# Patient Record
Sex: Male | Born: 1990
Health system: Southern US, Community
[De-identification: ages and names within clinical notes are randomized; demographics above are authoritative.]

## PROBLEM LIST (undated history)

## (undated) DIAGNOSIS — F419 Anxiety disorder, unspecified: Secondary | ICD-10-CM

## (undated) DIAGNOSIS — K3184 Gastroparesis: Secondary | ICD-10-CM

## (undated) DIAGNOSIS — K219 Gastro-esophageal reflux disease without esophagitis: Secondary | ICD-10-CM

## (undated) DIAGNOSIS — F32A Depression, unspecified: Secondary | ICD-10-CM

## (undated) DIAGNOSIS — E119 Type 2 diabetes mellitus without complications: Secondary | ICD-10-CM

## (undated) HISTORY — DX: Gastro-esophageal reflux disease without esophagitis: K21.9

## (undated) HISTORY — DX: Anxiety disorder, unspecified: F41.9

## (undated) HISTORY — DX: Depression, unspecified: F32.A

## (undated) HISTORY — PX: NO PAST SURGERIES: SHX2092

## (undated) HISTORY — PX: UPPER GASTROINTESTINAL ENDOSCOPY: SHX188

---
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
## 2008-05-20 ENCOUNTER — Emergency Department (HOSPITAL_COMMUNITY): Admission: EM | Admit: 2008-05-20 | Discharge: 2008-05-21 | Payer: Self-pay | Admitting: Emergency Medicine

## 2010-05-15 ENCOUNTER — Emergency Department (HOSPITAL_COMMUNITY): Admission: EM | Admit: 2010-05-15 | Discharge: 2010-05-16 | Payer: Self-pay | Admitting: Emergency Medicine

## 2010-12-06 LAB — CBC
MCH: 27 pg (ref 26.0–34.0)
MCV: 79.7 fL (ref 78.0–100.0)
Platelets: 356 10*3/uL (ref 150–400)
RDW: 13.6 % (ref 11.5–15.5)

## 2010-12-06 LAB — URINALYSIS, ROUTINE W REFLEX MICROSCOPIC
Bilirubin Urine: NEGATIVE
Glucose, UA: >1000 mg/dL — AB
Ketones, ur: NEGATIVE mg/dL
Nitrite: NEGATIVE
pH: 5.5 (ref 5.0–8.0)

## 2010-12-06 LAB — DIFFERENTIAL
Basophils Absolute: 0.1 10*3/uL (ref 0.0–0.1)
Eosinophils Absolute: 0.4 10*3/uL (ref 0.0–0.7)
Eosinophils Relative: 4 % (ref 0–5)

## 2010-12-06 LAB — URINE CULTURE
Colony Count: >=100000
Culture  Setup Time: 201108250454

## 2010-12-06 LAB — POCT I-STAT, CHEM 8
Hemoglobin: 16.3 g/dL (ref 13.0–17.0)
Sodium: 135 mEq/L (ref 135–145)
TCO2: 24 mmol/L (ref 0–100)

## 2010-12-06 LAB — GLUCOSE, CAPILLARY
Glucose-Capillary: 330 mg/dL — ABNORMAL HIGH (ref 70–99)
Glucose-Capillary: 343 mg/dL — ABNORMAL HIGH (ref 70–99)
Glucose-Capillary: 392 mg/dL — ABNORMAL HIGH (ref 70–99)

## 2010-12-06 LAB — URINE MICROSCOPIC-ADD ON

## 2010-12-29 ENCOUNTER — Emergency Department (HOSPITAL_COMMUNITY): Payer: Self-pay

## 2010-12-29 ENCOUNTER — Emergency Department (HOSPITAL_COMMUNITY)
Admission: EM | Admit: 2010-12-29 | Discharge: 2010-12-29 | Disposition: A | Discharge status: Home / Self Care | Payer: Self-pay | Attending: Emergency Medicine | Admitting: Emergency Medicine

## 2010-12-29 DIAGNOSIS — E119 Type 2 diabetes mellitus without complications: Secondary | ICD-10-CM | POA: Insufficient documentation

## 2010-12-29 DIAGNOSIS — Y9361 Activity, american tackle football: Secondary | ICD-10-CM | POA: Insufficient documentation

## 2010-12-29 DIAGNOSIS — M25519 Pain in unspecified shoulder: Secondary | ICD-10-CM | POA: Insufficient documentation

## 2010-12-29 DIAGNOSIS — W1801XA Striking against sports equipment with subsequent fall, initial encounter: Secondary | ICD-10-CM | POA: Insufficient documentation

## 2011-02-04 NOTE — Discharge Summary (Signed)
NAMEMARLOW, Jonathan Porter               ACCOUNT NO.:  [id number]   MEDICAL RECORD NO.:  [id number]          PATIENT TYPE:  INP   LOCATION:  1430                         FACILITY:  [facility]   PHYSICIAN:  Ladell Pier, M.D.   DATE OF BIRTH:  December 05, 1990   DATE OF ADMISSION:  08/30/2007  DATE OF DISCHARGE:  09/02/2007                               DISCHARGE SUMMARY   DISCHARGE DIAGNOSES:  1. New-onset diabetes most likely type 2, lab work still pending on      discharge.  2. Dyslipidemia.  3. Obesity.  4. Hypokalemia.  5. Diabetic ketoacidosis.  6. Elevated blood pressure.   DISCHARGE MEDICATIONS:  1. Lantus 40 units in the morning and 36 units at bedtime.  2. NovoLog resistant sliding scale as given to the patient.  Lancet,      strips and Glucometer.   FOLLOW-UP APPOINTMENTS:  The patient to follow up with Dr. Nehemiah Settle or  with [facility] next week.   PROCEDURE:  None.   CONSULTANTS:  None.   HISTORY OF PRESENT ILLNESS:  The patient is an 20 year old African-  American male that presented to the hospital secondary to dizziness upon  ambulation, polyuria, polydipsia, nausea with active fatigue.  He  apparently went to [facility].  CBG was high and  ketones in the urine.  The patient then was sent to the emergency room  to be evaluated for DKA.  He had no chest pain.  No shortness of breath.  No mental status changes.   Past medical history, family, social history, meds, allergies, review of  systems per admission H&P.   PHYSICAL EXAMINATION:  VITAL SIGNS:  Temperature 98.1, pulse of 91,  respirations 20, blood pressure 152/76, pulse ox 100% on room air.  CBG  158-444.  HEENT:  Head is normocephalic, atraumatic.  Pupils are equal, round and  reactive to light.  Throat without erythema.  CARDIOVASCULAR:  Regular rate and rhythm.  LUNGS:  Clear bilaterally.  ABDOMEN:  Positive bowel sounds.  EXTREMITIES:  No edema.   HOSPITAL COURSE:  1.  Diabetic ketoacidosis:  The patient was admitted to the hospital,      placed on IV fluids and a Glucommander drip.  Blood sugars came      down.  When the Glucommander drip was and discontinued and the      patient was transitioned to Lantus and insulin, blood sugar      increased, and he was placed back on the Glucommander.  The patient      will be held until  lunchtime today and given an increased dose of      Lantus.  The patient told to follow up with primary care physician      next week for management of his diabetes.  He did have labs drawn      to determine if he is type 1 versus type 2.  The labs were pending      on discharge.  He is most likely type 2 secondary to him having      acanthosis nigricans, his obesity and  high cholesterol with      elevated blood pressure.  On follow up of the labs,  he could get      his other lab tests to help with the evaluation of type 1 versus      type 2 diabetes.  Labs that were ordered were glutamic acid      decarboxylase, tyrosine phosphatase serum, isolette specific      pancreatic autoantibodies.  However, we will discontinue the      patient on Lantus and sliding scale secondary to his elevated blood      sugars.  However if he does turn out to be type 1, he could be      transitioned to pills on an outpatient basis.  2. Elevated blood pressure:  His blood pressure could be elevated just      from him receiving high-dose fluids.  We will not start him on      blood pressure medicine.  He will immediately start diet and      exercise on discharge.  We will recheck his blood pressure on an      outpatient basis.  3. Dyslipidemia:  He was also noted to have high cholesterol.      Secondary to his age, I  will also not start him on cholesterol      medicine.  I will go ahead and encourage him to do diet and      exercise.  Hopefully with that, the cholesterol problems will      resolve.  4. Obesity:  Talked with him about diet and  exercise.  That will      definitely help with all the elevated blood pressure, the new onset      type 2 diabetes and the high cholesterol.  I really stressed the      importance of diet and exercise.  Also I gave him a card for clinic      to assist with diet and exercise planning.   LABORATORY DATA:  Discharge labs:  Sodium 137, potassium 3.4, the  patient received potassium replacement prior to discharge, chloride 109,  CO2 20, glucose 349, BUN 10, creatinine 0.94, WBC 5.7, hemoglobin 13.1,  platelets 265, phosphorus 2.8, magnesium 2.1, hemoglobin A1c 13.9, total  cholesterol 339, triglycerides 260, HDL 39, LDL 248.   DIAGNOSTICS:  Chest x-ray showed no acute disease.      Ladell Pier, M.D.  Electronically Signed     NJ/MEDQ  D:  09/02/2007  T:  09/02/2007  Job:  [id number]   cc:   [facility] [place]  Fax: [phone]   Jonathan Porter, M.D.

## 2011-02-04 NOTE — H&P (Signed)
Jonathan Porter, ENGRAM NO.:  [id number]   MEDICAL RECORD NO.:  [id number]         PATIENT TYPE:  LINP   LOCATION:                               FACILITY:  [facility]   PHYSICIAN:  Lucita Ferrara, MD         DATE OF BIRTH:  11/19/1990   DATE OF ADMISSION:  08/30/2007  DATE OF DISCHARGE:                              HISTORY & PHYSICAL   The patient is an 20 year old male who presents to [facility] from [facility].  Primary care physician is  [facility].  Apparently the patient had been seeing Dr.  Renford Dills from [facility] in the past and was unable to get an  appointment and switched primary care doctors today.   CHIEF COMPLAINT:  Decreased p.o. intake, vomiting, nausea x1 week.   REVIEW OF SYSTEMS:  The patient has also been dizzy upon ambulation,  polyuria, polydipsia; but, because the patient has been very nauseated,  had decreased intake in the interim period.  These symptoms have been  associated with a lot of fatigue.  He does go to the bathroom a lot but  denies any urinary burning or blood present.  Apparently at the  [facility], the CBGs were high, and there were  ketones in the urine.  The patient then was sent her to be evaluated for  diabetic ketoacidosis.  Never had any symptoms such as this before.  The  patient denies any chest pain, shortness of breath, focal neurological  deficits.  Denies any recent infections, fevers, chills, neck pain.  The  patient has been fatigued and groggy,  but denies any changes in mental  status or incomprehensible speech and no changes in visual acuity.  Otherwise,  12-point Review of Systems is negative.   MEDICATIONS:  None.   ALLERGIES:  No known drug allergies.   PAST MEDICAL HISTORY:  None.   PAST SURGICAL HISTORY:  None.   SOCIAL HISTORY:  The patient lives with parents.  He is in the 12th  grade and denies drugs, alcohol, or  tobacco.   PHYSICAL EXAMINATION:  VITAL SIGNS:  Blood pressure 161/57, pulse 122,  respirations 20, pulse oximetry 96% on room air.  HEENT:  Normocephalic and atraumatic.  Mucous membranes are moist.  Extraocular muscles intact.  PERRLA.  GENERAL:  The patient looks  kind of fatigued but alert and oriented and  talkative.  NECK:  No JVD, no carotid bruits.  Neck supple.  CARDIOVASCULAR:  S1, S2.  Regular rate and rhythm.  No murmurs, rubs, or  clicks.  ABDOMEN:  Soft, nontender, nondistended.  Positive bowel sounds.  LUNGS:  Clear to auscultation bilaterally.  No rhonchi, rales, or  wheezes.  EXTREMITIES:  No clubbing, cyanosis, or edema.   LABORATORY DATA:  Shows a pH of 7.325, pCO2 of 29.6, pO2 of 96.8, bicarb  15. White count 12, hemoglobin 16.2, hematocrit 48.9, platelets 401.  Neutrophil count of 84% which is elevated.  Sodium 127, potassium 5.7,  chloride 86, CO2 15, BUN 24, creatinine 1.97, calcium 10.2.  Urine shows  specific gravity of 1.034, pH 5, and ketones, leukocyte esterase  negative, urine nitrites negative.  Serum acetone ws not done.   There  was no chest x-ray done.   EKG shows sinus tachycardia with nonspecific ST-T wave changes.   ASSESSMENT AND PLAN:  1. This 20 year old young African-American gentleman is obviously in      diabetic ketoacidosis first time, new onset diabetes.  The patient      is quite dehydrated.  We are going to go ahead and hydrate him with      normal saline.  Currently it is running at 125 mL an hour.  I am      going to give him 2 liters as I think he is behind on his fluids      given his hemoconcentration of 16.2/48/9.  In addition, his urine      specific gravity is 1.034, additional support for his dehydration.      He has pseudo-hyponatremia secondary to hyperglycemia and gap      acidosis consistent with diabetic ketoacidosis.  We will go ahead      and start him on an insulin drip per protocol and monitor his      potassium,  magnesium, and start IV D5 one-half normal saline once      blood sugars drop below 250.  In addition, we are going to go ahead      and repeat potassium given intracellular depletion with insulin.  2. Diabetic control.  I am going to go ahead and get a hemoglobin A1c      and perhaps diabetic teaching to show diabetes video,  and patient      then will be started on oral.  Also once his insulin goes down, I      will get a fasting insulin level to see if patient is type 1 or      type 2.  The patient will most likely go home either with oral or      insulin depending on his needs and his beta cell function.  3. In regards to hypertension, this may be reactive. We are going to      go ahead an monitor it.  I will put him on a low dose of non-ACE,      non-thiazide medicine, perhaps metoprolol or beta  blocker p.r.n.  4. Just for completeness, I am going to go ahead and get a chest x-ray      given his leukocytosis.  His urine is clean.  5. Creatinine is 1.97, BUN 24.  Obviously prerenal azotemia secondary      to hydration status, but will monitor his kidney function, renal      function, and if still elevated, will go ahead and work that up      with either doing urine protein, creatinine, urine sodium,      bilateral renal ultrasound possibly, and later on may decide to get      a nephrology consult if this does not correct.   Given that he has two primary care doctors, Dr. Renford Dills with [facility], and [facility] which he started with today,  will send copy to both individuals, letting them know that the patient  is here in the hospital.  I do believe they are still deciding who to  follow up with.   Currently the patient is hemodynamically stable, and I think a telemetry  bed will be adequate  for him.  If  things deteriorate and his metabolic  status become decompensated, we will go ahead and admit him to the  intensive care unit.  I have explained all  plans and procedures of this  admission to the family.  Mother and father are at bedside, and they  both understand.      Lucita Ferrara, MD  Electronically Signed     RR/MEDQ  D:  08/30/2007  T:  08/30/2007  Job:  [id number]   cc:   Deirdre Peer. Polite, M.D.

## 2011-06-30 LAB — BASIC METABOLIC PANEL
BUN: 14
BUN: 16
BUN: 16
BUN: 18
BUN: 19
CO2: 20
CO2: 23
CO2: 25
CO2: 26
CO2: 27
CO2: 28
Calcium: 8.9
Calcium: 9.1
Calcium: 9.4
Calcium: 9.6
Calcium: 9.9
Calcium: 9.9
Chloride: 111
Chloride: 111
Chloride: 114 — ABNORMAL HIGH
Creatinine, Ser: 0.96
Creatinine, Ser: 1.03
Creatinine, Ser: 1.07
Creatinine, Ser: 1.09
Creatinine, Ser: 1.2
Creatinine, Ser: 1.24
Creatinine, Ser: 1.29
GFR calc Af Amer: >60
GFR calc Af Amer: >60
GFR calc Af Amer: >60
GFR calc Af Amer: >60
GFR calc Af Amer: >60
GFR calc Af Amer: >60
GFR calc non Af Amer: >60
GFR calc non Af Amer: >60
GFR calc non Af Amer: >60
GFR calc non Af Amer: >60
Glucose, Bld: 170 — ABNORMAL HIGH
Glucose, Bld: 203 — ABNORMAL HIGH
Glucose, Bld: 206 — ABNORMAL HIGH
Glucose, Bld: 221 — ABNORMAL HIGH
Glucose, Bld: 409 — ABNORMAL HIGH
Potassium: 3.5
Potassium: 3.7
Sodium: 137
Sodium: 138
Sodium: 147 — ABNORMAL HIGH
Sodium: 148 — ABNORMAL HIGH

## 2011-06-30 LAB — COMPREHENSIVE METABOLIC PANEL
ALT: 39
Calcium: 10.5
GFR calc Af Amer: 54 — ABNORMAL LOW
Glucose, Bld: 1168
Sodium: 127 — ABNORMAL LOW
Total Protein: 8.8 — ABNORMAL HIGH

## 2011-06-30 LAB — CBC
Hemoglobin: 13.1
Hemoglobin: 16.2
MCHC: 33.1
MCHC: 34
RBC: 5.07
RDW: 13.2
WBC: 5.7

## 2011-06-30 LAB — URINALYSIS, ROUTINE W REFLEX MICROSCOPIC
Glucose, UA: >1000 — AB
Specific Gravity, Urine: 1.034 — ABNORMAL HIGH
pH: 5

## 2011-06-30 LAB — BLOOD GAS, ARTERIAL
Drawn by: 101881
FIO2: 0.21
pCO2 arterial: 29.6 — ABNORMAL LOW
pH, Arterial: 7.325 — ABNORMAL LOW
pO2, Arterial: 96.8

## 2011-06-30 LAB — PHOSPHORUS
Phosphorus: 1.4 — ABNORMAL LOW
Phosphorus: 2.1 — ABNORMAL LOW
Phosphorus: 2.1 — ABNORMAL LOW
Phosphorus: 2.8

## 2011-06-30 LAB — HEMOGLOBIN A1C: Hgb A1c MFr Bld: 13.9 — ABNORMAL HIGH

## 2011-06-30 LAB — LIPID PANEL
HDL: 39
LDL Cholesterol: 248 — ABNORMAL HIGH
Total CHOL/HDL Ratio: 8.7
Triglycerides: 260 — ABNORMAL HIGH
VLDL: 52 — ABNORMAL HIGH

## 2011-06-30 LAB — DIFFERENTIAL
Eosinophils Absolute: 0 — ABNORMAL LOW
Lymphocytes Relative: 11 — ABNORMAL LOW
Lymphs Abs: 1.3
Monocytes Relative: 5
Neutrophils Relative %: 84 — ABNORMAL HIGH

## 2011-06-30 LAB — GLUCOSE, RANDOM: Glucose, Bld: 490 — ABNORMAL HIGH

## 2011-06-30 LAB — MAGNESIUM: Magnesium: 2.1

## 2011-06-30 LAB — URINE MICROSCOPIC-ADD ON: RBC / HPF: NONE SEEN

## 2011-08-01 ENCOUNTER — Emergency Department (HOSPITAL_COMMUNITY)
Admission: EM | Admit: 2011-08-01 | Discharge: 2011-08-02 | Discharge status: Against Medical Advice | Payer: Self-pay | Attending: Emergency Medicine | Admitting: Emergency Medicine

## 2011-08-01 ENCOUNTER — Encounter: Payer: Self-pay | Admitting: *Deleted

## 2011-08-01 DIAGNOSIS — R040 Epistaxis: Secondary | ICD-10-CM | POA: Insufficient documentation

## 2011-08-01 DIAGNOSIS — S0993XA Unspecified injury of face, initial encounter: Secondary | ICD-10-CM

## 2011-08-01 DIAGNOSIS — S0120XA Unspecified open wound of nose, initial encounter: Secondary | ICD-10-CM | POA: Insufficient documentation
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
## 2011-08-02 ENCOUNTER — Encounter (HOSPITAL_COMMUNITY): Payer: Self-pay

## 2011-08-02 ENCOUNTER — Emergency Department (INDEPENDENT_AMBULATORY_CARE_PROVIDER_SITE_OTHER): Payer: Self-pay

## 2011-08-02 ENCOUNTER — Emergency Department (INDEPENDENT_AMBULATORY_CARE_PROVIDER_SITE_OTHER)
Admission: EM | Admit: 2011-08-02 | Discharge: 2011-08-02 | Disposition: A | Discharge status: Home / Self Care | Payer: Self-pay | Source: Home / Self Care | Admitting: Emergency Medicine

## 2011-08-02 DIAGNOSIS — S0120XA Unspecified open wound of nose, initial encounter: Secondary | ICD-10-CM

## 2011-08-02 DIAGNOSIS — S0121XA Laceration without foreign body of nose, initial encounter: Secondary | ICD-10-CM

## 2011-08-02 MED ORDER — HYDROCODONE-ACETAMINOPHEN 5-325 MG PO TABS: 1.0000 | ORAL_TABLET | ORAL | Status: AC | PRN

## 2011-08-02 MED ORDER — DOXYCYCLINE HYCLATE 100 MG PO CAPS: 100.0000 mg | ORAL_CAPSULE | Freq: Two times a day (BID) | ORAL | Status: AC

## 2011-08-02 MED ORDER — IBUPROFEN 600 MG PO TABS: 600.0000 mg | ORAL_TABLET | Freq: Four times a day (QID) | ORAL | Status: AC | PRN

## 2011-08-02 MED ORDER — HYDROCODONE-ACETAMINOPHEN 5-325 MG PO TABS
ORAL_TABLET | ORAL | Status: AC
  Filled 2011-08-02: qty 1

## 2011-08-02 MED ORDER — HYDROCODONE-ACETAMINOPHEN 5-325 MG PO TABS
1.0000 | ORAL_TABLET | Freq: Once | ORAL | Status: AC
  Administered 2011-08-02: 1 via ORAL
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
## 2011-08-06 NOTE — ED Provider Notes (Signed)
Medical screening examination/treatment/procedure(s) were performed by non-physician practitioner and as supervising physician I was immediately available for consultation/collaboration.  Luiz Blare MD   Danella Maiers Carleigh Buccieri 08/06/11 1401

## 2014-08-07 ENCOUNTER — Inpatient Hospital Stay (HOSPITAL_COMMUNITY)
Admission: EM | Admit: 2014-08-07 | Discharge: 2014-08-09 | DRG: 638 | Disposition: A | Discharge status: Home / Self Care | Payer: Self-pay | Attending: Internal Medicine | Admitting: Internal Medicine

## 2014-08-07 ENCOUNTER — Encounter (HOSPITAL_COMMUNITY): Payer: Self-pay | Admitting: Emergency Medicine

## 2014-08-07 DIAGNOSIS — F17210 Nicotine dependence, cigarettes, uncomplicated: Secondary | ICD-10-CM | POA: Diagnosis present

## 2014-08-07 DIAGNOSIS — E1010 Type 1 diabetes mellitus with ketoacidosis without coma: Secondary | ICD-10-CM | POA: Diagnosis present

## 2014-08-07 DIAGNOSIS — E1110 Type 2 diabetes mellitus with ketoacidosis without coma: Secondary | ICD-10-CM | POA: Diagnosis present

## 2014-08-07 DIAGNOSIS — E871 Hypo-osmolality and hyponatremia: Secondary | ICD-10-CM | POA: Diagnosis present

## 2014-08-07 DIAGNOSIS — E1310 Other specified diabetes mellitus with ketoacidosis without coma: Principal | ICD-10-CM | POA: Diagnosis present

## 2014-08-07 DIAGNOSIS — N471 Phimosis: Secondary | ICD-10-CM | POA: Diagnosis present

## 2014-08-07 DIAGNOSIS — F1290 Cannabis use, unspecified, uncomplicated: Secondary | ICD-10-CM | POA: Diagnosis present

## 2014-08-07 HISTORY — DX: Type 2 diabetes mellitus without complications: E11.9

## 2014-08-07 LAB — GLUCOSE, CAPILLARY
GLUCOSE-CAPILLARY: 268 mg/dL — AB (ref 70–99)
GLUCOSE-CAPILLARY: 208 mg/dL — AB (ref 70–99)
GLUCOSE-CAPILLARY: 188 mg/dL — AB (ref 70–99)
Glucose-Capillary: 402 mg/dL — ABNORMAL HIGH (ref 70–99)

## 2014-08-07 LAB — BASIC METABOLIC PANEL
Anion gap: 15 (ref 5–15)
Anion gap: 15 (ref 5–15)
BUN: 16 mg/dL (ref 6–23)
BUN: 15 mg/dL (ref 6–23)
CHLORIDE: 98 meq/L (ref 96–112)
CHLORIDE: 101 meq/L (ref 96–112)
CO2: 26 mEq/L (ref 19–32)
CO2: 22 mEq/L (ref 19–32)
Calcium: 10.3 mg/dL (ref 8.4–10.5)
Calcium: 10 mg/dL (ref 8.4–10.5)
Creatinine, Ser: 1.11 mg/dL (ref 0.50–1.35)
Creatinine, Ser: 0.86 mg/dL (ref 0.50–1.35)
GFR calc Af Amer: >90 mL/min (ref >90)
GFR calc non Af Amer: >90 mL/min (ref >90)
GLUCOSE: 322 mg/dL — AB (ref 70–99)
GLUCOSE: 190 mg/dL — AB (ref 70–99)
POTASSIUM: 3.9 meq/L (ref 3.7–5.3)
POTASSIUM: 5.6 meq/L — AB (ref 3.7–5.3)
SODIUM: 139 meq/L (ref 137–147)
SODIUM: 138 meq/L (ref 137–147)

## 2014-08-07 LAB — CBC
HCT: 43.6 % (ref 39.0–52.0)
HEMATOCRIT: 45.3 % (ref 39.0–52.0)
HEMOGLOBIN: 15.8 g/dL (ref 13.0–17.0)
HEMOGLOBIN: 15.4 g/dL (ref 13.0–17.0)
MCH: 26.6 pg (ref 26.0–34.0)
MCH: 26.4 pg (ref 26.0–34.0)
MCHC: 34.9 g/dL (ref 30.0–36.0)
MCHC: 35.3 g/dL (ref 30.0–36.0)
MCV: 76.3 fL — AB (ref 78.0–100.0)
MCV: 74.7 fL — AB (ref 78.0–100.0)
Platelets: 281 10*3/uL (ref 150–400)
Platelets: 301 10*3/uL (ref 150–400)
RBC: 5.94 MIL/uL — ABNORMAL HIGH (ref 4.22–5.81)
RBC: 5.84 MIL/uL — AB (ref 4.22–5.81)
RDW: 12.6 % (ref 11.5–15.5)
RDW: 12.6 % (ref 11.5–15.5)
WBC: 6.6 10*3/uL (ref 4.0–10.5)
WBC: 8.3 10*3/uL (ref 4.0–10.5)

## 2014-08-07 LAB — COMPREHENSIVE METABOLIC PANEL
ALK PHOS: 137 U/L — AB (ref 39–117)
ALT: 21 U/L (ref 0–53)
ANION GAP: 23 — AB (ref 5–15)
AST: 19 U/L (ref 0–37)
Albumin: 4.5 g/dL (ref 3.5–5.2)
BUN: 19 mg/dL (ref 6–23)
CALCIUM: 10.1 mg/dL (ref 8.4–10.5)
CO2: 20 mEq/L (ref 19–32)
CREATININE: 1.13 mg/dL (ref 0.50–1.35)
Chloride: 83 mEq/L — ABNORMAL LOW (ref 96–112)
GFR calc non Af Amer: 89 mL/min — ABNORMAL LOW (ref >90)
GLUCOSE: 919 mg/dL — AB (ref 70–99)
Potassium: 4.8 mEq/L (ref 3.7–5.3)
Sodium: 126 mEq/L — ABNORMAL LOW (ref 137–147)
TOTAL PROTEIN: 9 g/dL — AB (ref 6.0–8.3)
Total Bilirubin: 0.3 mg/dL (ref 0.3–1.2)

## 2014-08-07 LAB — URINALYSIS, ROUTINE W REFLEX MICROSCOPIC
BILIRUBIN URINE: NEGATIVE
Glucose, UA: >1000 mg/dL — AB
Hgb urine dipstick: NEGATIVE
Ketones, ur: 15 mg/dL — AB
LEUKOCYTES UA: NEGATIVE
NITRITE: NEGATIVE
Protein, ur: NEGATIVE mg/dL
SPECIFIC GRAVITY, URINE: 1.03 (ref 1.005–1.030)
UROBILINOGEN UA: 0.2 mg/dL (ref 0.0–1.0)
pH: 5 (ref 5.0–8.0)

## 2014-08-07 LAB — CBG MONITORING, ED
Glucose-Capillary: >600 mg/dL (ref 70–99)
Glucose-Capillary: 506 mg/dL — ABNORMAL HIGH (ref 70–99)

## 2014-08-07 LAB — URINE MICROSCOPIC-ADD ON

## 2014-08-07 MED ORDER — INSULIN REGULAR HUMAN 100 UNIT/ML IJ SOLN
INTRAMUSCULAR | Status: DC
  Administered 2014-08-07: 3.4 [IU]/h via INTRAVENOUS
  Administered 2014-08-08: 1.4 [IU]/h via INTRAVENOUS
  Administered 2014-08-08: 1.7 [IU]/h via INTRAVENOUS
  Filled 2014-08-07: qty 2.5

## 2014-08-07 MED ORDER — DEXTROSE 50 % IV SOLN: 25.0000 mL | INTRAVENOUS | Status: DC | PRN

## 2014-08-07 MED ORDER — HEPARIN SODIUM (PORCINE) 5000 UNIT/ML IJ SOLN
5000.0000 [IU] | Freq: Three times a day (TID) | INTRAMUSCULAR | Status: DC
  Administered 2014-08-07 – 2014-08-09 (×5): 5000 [IU] via SUBCUTANEOUS
  Filled 2014-08-07 (×9): qty 1

## 2014-08-07 MED ORDER — INSULIN REGULAR HUMAN 100 UNIT/ML IJ SOLN
INTRAMUSCULAR | Status: DC
  Administered 2014-08-07: 5.4 [IU]/h via INTRAVENOUS
  Filled 2014-08-07: qty 2.5

## 2014-08-07 MED ORDER — DEXTROSE-NACL 5-0.45 % IV SOLN: INTRAVENOUS | Status: DC

## 2014-08-07 MED ORDER — DEXTROSE-NACL 5-0.45 % IV SOLN
INTRAVENOUS | Status: DC
  Administered 2014-08-07: 75 mL/h via INTRAVENOUS
  Administered 2014-08-08: 13:00:00 via INTRAVENOUS

## 2014-08-07 MED ORDER — POTASSIUM CHLORIDE 10 MEQ/100ML IV SOLN
10.0000 meq | INTRAVENOUS | Status: AC
  Administered 2014-08-07 (×2): 10 meq via INTRAVENOUS
  Filled 2014-08-07 (×2): qty 100

## 2014-08-07 MED ORDER — SODIUM CHLORIDE 0.9 % IV BOLUS (SEPSIS)
1000.0000 mL | Freq: Once | INTRAVENOUS | Status: AC
  Administered 2014-08-07: 1000 mL via INTRAVENOUS

## 2014-08-07 MED ORDER — SODIUM CHLORIDE 0.9 % IV SOLN: INTRAVENOUS | Status: AC

## 2014-08-07 MED ORDER — SODIUM CHLORIDE 0.9 % IV SOLN
INTRAVENOUS | Status: DC
  Administered 2014-08-07: 100 mL/h via INTRAVENOUS

## 2014-08-07 NOTE — ED Notes (Signed)
Patient reports tht he has had increased urination, thirst x 1 week. Patient denies any vision disturbances, N/V or abdominal pain.

## 2014-08-07 NOTE — ED Notes (Addendum)
Pt from home c/o hyperglycemia/ CBG registered greater than 600. Pt also c/o penile pain and reports he is not circumcised. It is swollen and he is unable to retract foreskin. Denies discharge or dysuria but reports itching.. NO PCP and not on insulin or diabetes medication. PT does not have insurance.

## 2014-08-07 NOTE — ED Provider Notes (Signed)
CSN: [id number]     Arrival date & time 08/07/14  1413 History   First MD Initiated Contact with Patient 08/07/14 1453     Chief Complaint  Patient presents with  . Hyperglycemia  . Penis Pain     (Consider location/radiation/quality/duration/timing/severity/associated sxs/prior Treatment) Patient is a 23 y.o. male presenting with general illness.  Illness Location:  Generalized, penis Quality:  Malaise, increased urinary frequency, phimosis Severity:  Moderate Onset quality:  Gradual Duration:  2 weeks Timing:  Constant Progression:  Worsening Chronicity:  Recurrent Context:  DM, drinking sugary drinks Relieved by:  Nothing Worsened by:  Nothing Associated symptoms: no abdominal pain, no chest pain, no cough, no fever, no loss of consciousness, no nausea, no shortness of breath and no vomiting     Past Medical History  Diagnosis Date  . Diabetes mellitus without complication    Past Surgical History  Procedure Laterality Date  . No past surgeries     History reviewed. No pertinent family history. History  Substance Use Topics  . Smoking status: Current Every Day Smoker -- 0.25 packs/day for 6 years    Types: Cigarettes  . Smokeless tobacco: Never Used  . Alcohol Use: No    Review of Systems  Constitutional: Negative for fever.  Respiratory: Negative for cough and shortness of breath.   Cardiovascular: Negative for chest pain.  Gastrointestinal: Negative for nausea, vomiting and abdominal pain.  Neurological: Negative for loss of consciousness.  All other systems reviewed and are negative.     Allergies  Review of patient's allergies indicates no known allergies.  Home Medications   Prior to Admission medications   Not on File   BP 132/78 mmHg  Pulse 73  Temp(Src) 97.8 F (36.6 C) (Oral)  Resp 13  Wt 242 lb 4.6 oz (109.9 kg)  SpO2 97% Physical Exam  Constitutional: He is oriented to person, place, and time. He appears well-developed and  well-nourished.  HENT:  Head: Normocephalic and atraumatic.  Eyes: Conjunctivae and EOM are normal.  Neck: Normal range of motion. Neck supple.  Cardiovascular: Normal rate, regular rhythm and normal heart sounds.   Pulmonary/Chest: Effort normal and breath sounds normal. No respiratory distress.  Abdominal: He exhibits no distension. There is no tenderness. There is no rebound and no guarding.  Genitourinary:  Phimosis without signs of paraphimosis or swelling, unable to visualize glans initially due to persistent phimosis  Musculoskeletal: Normal range of motion.  Neurological: He is alert and oriented to person, place, and time.  Skin: Skin is warm and dry.  Vitals reviewed.   ED Course  Procedures (including critical care time) Labs Review Labs Reviewed  CBC - Abnormal; Notable for the following:    RBC 5.94 (*)    MCV 76.3 (*)    All other components within normal limits  COMPREHENSIVE METABOLIC PANEL - Abnormal; Notable for the following:    Sodium 126 (*)    Chloride 83 (*)    Glucose, Bld 919 (*)    Total Protein 9.0 (*)    Alkaline Phosphatase 137 (*)    GFR calc non Af Amer 89 (*)    Anion gap 23 (*)    All other components within normal limits  URINALYSIS, ROUTINE W REFLEX MICROSCOPIC - Abnormal; Notable for the following:    Glucose, UA >1000 (*)    Ketones, ur 15 (*)    All other components within normal limits  CBC - Abnormal; Notable for the following:    RBC  5.84 (*)    MCV 74.7 (*)    All other components within normal limits  HEMOGLOBIN A1C - Abnormal; Notable for the following:    Hgb A1c MFr Bld 13.3 (*)    Mean Plasma Glucose 335 (*)    All other components within normal limits  GLUCOSE, CAPILLARY - Abnormal; Notable for the following:    Glucose-Capillary 402 (*)    All other components within normal limits  BASIC METABOLIC PANEL - Abnormal; Notable for the following:    Glucose, Bld 322 (*)    All other components within normal limits  BASIC  METABOLIC PANEL - Abnormal; Notable for the following:    Potassium 5.6 (*)    Glucose, Bld 190 (*)    All other components within normal limits  GLUCOSE, CAPILLARY - Abnormal; Notable for the following:    Glucose-Capillary 268 (*)    All other components within normal limits  GLUCOSE, CAPILLARY - Abnormal; Notable for the following:    Glucose-Capillary 208 (*)    All other components within normal limits  GLUCOSE, CAPILLARY - Abnormal; Notable for the following:    Glucose-Capillary 188 (*)    All other components within normal limits  GLUCOSE, CAPILLARY - Abnormal; Notable for the following:    Glucose-Capillary 129 (*)    All other components within normal limits  GLUCOSE, CAPILLARY - Abnormal; Notable for the following:    Glucose-Capillary 130 (*)    All other components within normal limits  GLUCOSE, CAPILLARY - Abnormal; Notable for the following:    Glucose-Capillary 150 (*)    All other components within normal limits  BASIC METABOLIC PANEL - Abnormal; Notable for the following:    Glucose, Bld 163 (*)    Anion gap 18 (*)    All other components within normal limits  BASIC METABOLIC PANEL - Abnormal; Notable for the following:    Glucose, Bld 209 (*)    Anion gap 17 (*)    All other components within normal limits  GLUCOSE, CAPILLARY - Abnormal; Notable for the following:    Glucose-Capillary 202 (*)    All other components within normal limits  GLUCOSE, CAPILLARY - Abnormal; Notable for the following:    Glucose-Capillary 229 (*)    All other components within normal limits  GLUCOSE, CAPILLARY - Abnormal; Notable for the following:    Glucose-Capillary 158 (*)    All other components within normal limits  GLUCOSE, CAPILLARY - Abnormal; Notable for the following:    Glucose-Capillary 136 (*)    All other components within normal limits  BASIC METABOLIC PANEL - Abnormal; Notable for the following:    Potassium 3.6 (*)    Glucose, Bld 148 (*)    Anion gap 16 (*)     All other components within normal limits  BASIC METABOLIC PANEL - Abnormal; Notable for the following:    Glucose, Bld 160 (*)    All other components within normal limits  GLUCOSE, CAPILLARY - Abnormal; Notable for the following:    Glucose-Capillary 143 (*)    All other components within normal limits  GLUCOSE, CAPILLARY - Abnormal; Notable for the following:    Glucose-Capillary 170 (*)    All other components within normal limits  GLUCOSE, CAPILLARY - Abnormal; Notable for the following:    Glucose-Capillary 226 (*)    All other components within normal limits  GLUCOSE, CAPILLARY - Abnormal; Notable for the following:    Glucose-Capillary 196 (*)    All other components  within normal limits  CBG MONITORING, ED - Abnormal; Notable for the following:    Glucose-Capillary >600 (*)    All other components within normal limits  CBG MONITORING, ED - Abnormal; Notable for the following:    Glucose-Capillary >600 (*)    All other components within normal limits  CBG MONITORING, ED - Abnormal; Notable for the following:    Glucose-Capillary 506 (*)    All other components within normal limits  MRSA PCR SCREENING  URINE MICROSCOPIC-ADD ON  URINE RAPID DRUG SCREEN (HOSP PERFORMED)  HIV ANTIBODY (ROUTINE TESTING)  BASIC METABOLIC PANEL  BASIC METABOLIC PANEL    Imaging Review No results found.   EKG Interpretation None      MDM   Final diagnoses:  None    23 y.o. male with pertinent PMH of DM presents with concern for hyperglycemia and inability to retract foreskin.  Patient has been systemically well with the exception of increased urinary frequency and malaise. He denies significant resting penile pain, difficulty urinating, other emergent urologic findings. On arrival vital signs physical exam as above. Initial glucose greater than 600.    Labs as above with DKA.  Feel outpt referral to urology for phimosis appropriate.  Consulted hospitalist for  admission  DKA    Mirian MoMatthew Coree Riester, MD 08/08/14 1216

## 2014-08-07 NOTE — Progress Notes (Signed)
  CARE MANAGEMENT ED NOTE 08/07/2014  Patient:  Jonathan Porter,Jonathan Porter   Account Number:  [id number]  Date Initiated:  08/07/2014  Documentation initiated by:  Edd ArbourGIBBS,Foye Haggart  Subjective/Objective Assessment:   23 yr old self pay [place] [employer] from home c/o hyperglycemia/ CBG registered greater than 600. Pt also c/o penile pain and reports he is not circumcised. It is swollen and he is unable to retract foreskin. Denies d/c     Subjective/Objective Assessment Detail:   or dysuria but reports itching.. NO PCP and not on insulin or diabetes medication. PT does not have insurance. increased urination, thirst x 1 week  No pcp as confrmed by pt  Agreed to P4 CC referral     Action/Plan:   ED CM spoke with pt to provide pcp, medication resources Discussed [facility] Completed referral see notes below Assisted pt with putting on hospital gown   Action/Plan Detail:   Anticipated DC Date:  08/10/2014     Status Recommendation to Physician:   Result of Recommendation:    Other ED Services  Consult Working Plan    DC Planning Services  Other  Outpatient Services - Pt will follow up  PCP issues  GCCN / P4HM (established/new)    Choice offered to / List presented to:            Status of service:  Completed, signed off  ED Comments:   ED Comments Detail:  CM spoke with pt who [place]. CM discussed and provided written information for self pay pcps, importance of pcp for f/u care, www.needymeds.org, discounted pharmacies and other [place] resources such as [facility], [facility], affordable care act,  financial assistance, DSS and  health department Reviewed resources for [place] self pay pcps like [facility], family medicine at [address], [facility], general medical clinics, [facility] urgent [facility] plus others, medication resources, [facility] out patient pharmacies and housing Pt voiced understanding and appreciation of resources  provided  Provided [facility] contact information Referral completed to [employer] [facility] staff

## 2014-08-07 NOTE — ED Notes (Signed)
Attempted to call report. Charge nurse to call back for report. 

## 2014-08-07 NOTE — H&P (Signed)
[facility] History and Physical  Jonathan Porter WUJ:[id number] DOB: 1991-06-29 DOA: 08/07/2014  Referring physician: Dr. Littie DeedsGentry  PCP: No primary care provider on file.   Chief Complaint: hyperglycemia, 3P's symptoms  HPI: Jonathan Porter is a 23 y.o. male with pmh of diabetes. Presented with polyurea, polyphagia, polydepsia and sugar more than 900 . Patient is calm, denies CP, SOB, fever, Chills, cough also denies  Dysurea. Patient in ED found with CBG's > 900 and elevated anion gap. No signs of infection appreciated at this moment.   Review of Systems:  Constitutional:  No weight loss, night sweats, Fevers, chills, fatigue.  HEENT:  No headaches, Difficulty swallowing,Tooth/dental problems,Sore throat,  No sneezing, itching, ear ache, nasal congestion, post nasal drip,  Cardio-vascular:  No chest pain, Orthopnea, PND, swelling in lower extremities, anasarca, dizziness, palpitations  GI:  No heartburn, indigestion, abdominal pain, nausea, vomiting, diarrhea, change in bowel habits, loss of appetite  Resp:  No shortness of breath with exertion or at rest. No excess mucus, no productive cough, No non-productive cough, No coughing up of blood.No change in color of mucus.No wheezing.No chest wall deformity  Skin:  no rash or lesions.  GU: positive phimosis no dysuria, change in color of urine, no urgency or frequency. No flank pain.  Musculoskeletal:  No joint pain or swelling. No decreased range of motion. No back pain.  Psych:  No change in mood or affect. No depression or anxiety. No memory loss.   Past Medical History  Diagnosis Date  . Diabetes mellitus without complication    Past Surgical History  Procedure Laterality Date  . No past surgeries     Social History:  reports that he has been smoking Cigarettes.  He has a 1.5 pack-year smoking history. He has never used smokeless tobacco. He reports that he does not drink alcohol or use illicit drugs.  No Known  Allergies  FMH: positive for DM and HTN  Prior to Admission medications   Not on File   Physical Exam: Filed Vitals:   08/07/14 1726 08/07/14 1800 08/07/14 1801 08/07/14 1830  BP: 136/78 107/86 86/61 131/81  Pulse: 88 88 93 111  Temp:      TempSrc:      Resp: 17  19   SpO2: 98% 97% 99% 99%    Wt Readings from Last 3 Encounters:  No data found for Wt    General:  Appears calm and comfortable Eyes: PERRL, normal lids, irises & conjunctiva; no icterus ENT: grossly normal hearing, lips & tongue; no exudates or erythema inside his mouth Neck: no LAD, masses or thyromegaly Cardiovascular: RRR, no m/r/g. No LE edema. Telemetry: SR, no arrhythmias  Respiratory: CTA bilaterally, no w/r/r. Normal respiratory effort. Abdomen: soft, nt, nd; positive B Skin: no rash or induration seen on limited exam Musculoskeletal: grossly normal tone BUE/BLE Psychiatric: grossly normal mood and affect, speech fluent and appropriate Neurologic: grossly non-focal.          Labs on Admission:  Basic Metabolic Panel:  Recent Labs Lab 08/07/14 1453  NA 126*  K 4.8  CL 83*  CO2 20  GLUCOSE 919*  BUN 19  CREATININE 1.13  CALCIUM 10.1   Liver Function Tests:  Recent Labs Lab 08/07/14 1453  AST 19  ALT 21  ALKPHOS 137*  BILITOT 0.3  PROT 9.0*  ALBUMIN 4.5   CBC:  Recent Labs Lab 08/07/14 1453  WBC 6.6  HGB 15.8  HCT 45.3  MCV 76.3*  PLT  281    CBG:  Recent Labs Lab 08/07/14 1418 08/07/14 1622 08/07/14 1809  GLUCAP >600* >600* 506*    Radiological Exams on Admission: No results found.  EKG:  None   Assessment/Plan 1-DKA (diabetic ketoacidoses): patient with hx of diabetes for the last 7 years or so; initially on insulin and according to him, subsequently transition to oral agents. For the last 2 year or so has not been on any meds and with diet indiscretion. Patient presented with polydipsia, polyphagia and polyurea. -CBG's in the 900 range and anion gap of  23 -will admit to stepdown and will start insulin drip -close follow up of electrolytes -NPO -follow DKA protocol orders -will check UDS and A1C -will ask diabetes coordinator to provide education and assist with discharge medication regimen  2-Phimosis: with some excoriations, but w/o signs of superimposed infections -will need urology follow up as an outpatient  3-Hyponatremia: due to hyperglycemia -will follow electrolytes trend -continue insulin and IVF's  4-DKA, type 1: uncontrolled Will need insulin therapy at discharge most likely -will monitor and discussed with diabetes coordinator -A1C 1 11.1      Code Status: Full DVT Prophylaxis:heparin  Family Communication: no family at bedside Disposition Plan: LOS > 2 midnights; stepdown, inpatient  Time spent: 30 minutes  [name], Charlotte Fidalgo [facility] Pager [phone]

## 2014-08-08 DIAGNOSIS — F1210 Cannabis abuse, uncomplicated: Secondary | ICD-10-CM

## 2014-08-08 LAB — GLUCOSE, CAPILLARY
GLUCOSE-CAPILLARY: 229 mg/dL — AB (ref 70–99)
GLUCOSE-CAPILLARY: 158 mg/dL — AB (ref 70–99)
GLUCOSE-CAPILLARY: 136 mg/dL — AB (ref 70–99)
GLUCOSE-CAPILLARY: 143 mg/dL — AB (ref 70–99)
GLUCOSE-CAPILLARY: 196 mg/dL — AB (ref 70–99)
GLUCOSE-CAPILLARY: 107 mg/dL — AB (ref 70–99)
GLUCOSE-CAPILLARY: 329 mg/dL — AB (ref 70–99)
Glucose-Capillary: 129 mg/dL — ABNORMAL HIGH (ref 70–99)
Glucose-Capillary: 130 mg/dL — ABNORMAL HIGH (ref 70–99)
Glucose-Capillary: 132 mg/dL — ABNORMAL HIGH (ref 70–99)
Glucose-Capillary: 150 mg/dL — ABNORMAL HIGH (ref 70–99)
Glucose-Capillary: 170 mg/dL — ABNORMAL HIGH (ref 70–99)
Glucose-Capillary: 202 mg/dL — ABNORMAL HIGH (ref 70–99)
Glucose-Capillary: 226 mg/dL — ABNORMAL HIGH (ref 70–99)
Glucose-Capillary: 520 mg/dL — ABNORMAL HIGH (ref 70–99)

## 2014-08-08 LAB — RAPID URINE DRUG SCREEN, HOSP PERFORMED
Amphetamines: NOT DETECTED
Barbiturates: NOT DETECTED
Benzodiazepines: NOT DETECTED
COCAINE: NOT DETECTED
OPIATES: NOT DETECTED
TETRAHYDROCANNABINOL: POSITIVE — AB

## 2014-08-08 LAB — BASIC METABOLIC PANEL
Anion gap: 18 — ABNORMAL HIGH (ref 5–15)
Anion gap: 17 — ABNORMAL HIGH (ref 5–15)
Anion gap: 16 — ABNORMAL HIGH (ref 5–15)
Anion gap: 14 (ref 5–15)
Anion gap: 13 (ref 5–15)
BUN: 14 mg/dL (ref 6–23)
BUN: 14 mg/dL (ref 6–23)
BUN: 14 mg/dL (ref 6–23)
BUN: 13 mg/dL (ref 6–23)
BUN: 14 mg/dL (ref 6–23)
CALCIUM: 9.8 mg/dL (ref 8.4–10.5)
CHLORIDE: 100 meq/L (ref 96–112)
CHLORIDE: 101 meq/L (ref 96–112)
CO2: 22 mEq/L (ref 19–32)
CO2: 23 meq/L (ref 19–32)
CO2: 25 mEq/L (ref 19–32)
CO2: 25 mEq/L (ref 19–32)
CO2: 26 mEq/L (ref 19–32)
CREATININE: 1.14 mg/dL (ref 0.50–1.35)
Calcium: 10 mg/dL (ref 8.4–10.5)
Calcium: 9.9 mg/dL (ref 8.4–10.5)
Calcium: 10.1 mg/dL (ref 8.4–10.5)
Calcium: 9.8 mg/dL (ref 8.4–10.5)
Chloride: 98 mEq/L (ref 96–112)
Chloride: 104 mEq/L (ref 96–112)
Chloride: 98 mEq/L (ref 96–112)
Creatinine, Ser: 0.88 mg/dL (ref 0.50–1.35)
Creatinine, Ser: 0.99 mg/dL (ref 0.50–1.35)
Creatinine, Ser: 1.06 mg/dL (ref 0.50–1.35)
Creatinine, Ser: 0.97 mg/dL (ref 0.50–1.35)
GFR calc Af Amer: >90 mL/min (ref >90)
GFR calc Af Amer: >90 mL/min (ref >90)
GFR calc Af Amer: >90 mL/min (ref >90)
GFR calc Af Amer: >90 mL/min (ref >90)
GFR calc non Af Amer: >90 mL/min (ref >90)
GFR calc non Af Amer: >90 mL/min (ref >90)
GFR calc non Af Amer: >90 mL/min (ref >90)
GFR calc non Af Amer: >90 mL/min (ref >90)
GFR calc non Af Amer: 88 mL/min — ABNORMAL LOW (ref >90)
GLUCOSE: 163 mg/dL — AB (ref 70–99)
GLUCOSE: 209 mg/dL — AB (ref 70–99)
GLUCOSE: 504 mg/dL — AB (ref 70–99)
Glucose, Bld: 148 mg/dL — ABNORMAL HIGH (ref 70–99)
Glucose, Bld: 160 mg/dL — ABNORMAL HIGH (ref 70–99)
POTASSIUM: 3.8 meq/L (ref 3.7–5.3)
Potassium: 3.8 mEq/L (ref 3.7–5.3)
Potassium: 3.6 mEq/L — ABNORMAL LOW (ref 3.7–5.3)
Potassium: 3.7 mEq/L (ref 3.7–5.3)
Potassium: 3.9 mEq/L (ref 3.7–5.3)
Sodium: 140 mEq/L (ref 137–147)
Sodium: 141 mEq/L (ref 137–147)
Sodium: 139 mEq/L (ref 137–147)
Sodium: 143 mEq/L (ref 137–147)
Sodium: 137 mEq/L (ref 137–147)

## 2014-08-08 LAB — HIV ANTIBODY (ROUTINE TESTING W REFLEX): HIV 1&2 Ab, 4th Generation: NONREACTIVE

## 2014-08-08 LAB — MRSA PCR SCREENING: MRSA by PCR: NEGATIVE

## 2014-08-08 LAB — HEMOGLOBIN A1C
Hgb A1c MFr Bld: 13.3 % — ABNORMAL HIGH (ref <5.7)
Mean Plasma Glucose: 335 mg/dL — ABNORMAL HIGH (ref <117)

## 2014-08-08 MED ORDER — INSULIN ASPART PROT & ASPART (70-30 MIX) 100 UNIT/ML ~~LOC~~ SUSP
12.0000 [IU] | Freq: Two times a day (BID) | SUBCUTANEOUS | Status: DC
  Administered 2014-08-08 – 2014-08-09 (×2): 12 [IU] via SUBCUTANEOUS
  Filled 2014-08-08: qty 10

## 2014-08-08 MED ORDER — SODIUM CHLORIDE 0.9 % IV SOLN
INTRAVENOUS | Status: DC
  Administered 2014-08-08 (×2): via INTRAVENOUS

## 2014-08-08 MED ORDER — INSULIN STARTER KIT- SYRINGES (ENGLISH)
1.0000 | Freq: Once | Status: AC
  Administered 2014-08-08: 1
  Filled 2014-08-08 (×2): qty 1

## 2014-08-08 MED ORDER — INSULIN ASPART 100 UNIT/ML ~~LOC~~ SOLN
0.0000 [IU] | Freq: Three times a day (TID) | SUBCUTANEOUS | Status: DC
  Administered 2014-08-09: 5 [IU] via SUBCUTANEOUS
  Administered 2014-08-09: 8 [IU] via SUBCUTANEOUS

## 2014-08-08 MED ORDER — INSULIN ASPART 100 UNIT/ML ~~LOC~~ SOLN: 4.0000 [IU] | Freq: Three times a day (TID) | SUBCUTANEOUS | Status: DC

## 2014-08-08 MED ORDER — INSULIN ASPART 100 UNIT/ML ~~LOC~~ SOLN
20.0000 [IU] | Freq: Once | SUBCUTANEOUS | Status: AC
Start: 2014-08-08 — End: 2014-08-08
  Administered 2014-08-08: 20 [IU] via SUBCUTANEOUS

## 2014-08-08 MED ORDER — INSULIN GLARGINE 100 UNIT/ML ~~LOC~~ SOLN
15.0000 [IU] | Freq: Once | SUBCUTANEOUS | Status: AC
  Administered 2014-08-08: 15 [IU] via SUBCUTANEOUS
  Filled 2014-08-08: qty 0.15

## 2014-08-08 MED ORDER — LIVING WELL WITH DIABETES BOOK
Freq: Once | Status: AC
  Administered 2014-08-08: 19:00:00
  Filled 2014-08-08 (×2): qty 1

## 2014-08-08 NOTE — Progress Notes (Signed)
[facility] PROGRESS NOTE  Jonathan QuamLarry J Porter [name] DOB: 06-26-1991 DOA: 08/07/2014 PCP: No primary care provider on file.  Assessment/Plan: 1-DKA (diabetic ketoacidoses): patient with hx of diabetes for the last 7 years or so; initially on insulin and according to him, subsequently transition to oral agents. For the last 2 year or so has not been on any meds and with diet indiscretion. Patient presented with polydipsia, polyphagia and polyurea. -CBG's less than 200 in 3 consecutives readings; bicarb 23 and anion gap 14 -will transition off insulin drip -start low carb diet  2-Phimosis: with some excoriations, but w/o signs of superimposed infections -will need urology follow up as an outpatient  3-Hyponatremia: due to hyperglycemia -resolved with IVF's and control of CBG's  4-DKA, type 1: uncontrolled -will need treatment with 70/30 and novolog TID -diabetes coordinator to assist with dosing -A1C 1 most recent 13.3  5-marijuana use: cessation counseling provided.  Code Status: Full Family Communication: no family at bedside Disposition Plan: home in 1-2 days; will move out of stepdown    Consultants:  Inpatient Diabetes coordinator   Procedures: None   Antibiotics:  None   HPI/Subjective: Denies N/V. Patient is hungry.   Objective: Filed Vitals:   08/08/14 1200  BP: 131/77  Pulse: 83  Temp:   Resp: 16    Intake/Output Summary (Last 24 hours) at 08/08/14 1304 Last data filed at 08/08/14 1200  Gross per 24 hour  Intake 1603.78 ml  Output   2580 ml  Net -976.22 ml   Filed Weights   08/08/14 0400  Weight: 109.9 kg (242 lb 4.6 oz)    Exam:   General:  NAD, afebrile.   Cardiovascular: S1 and S2, no rubs or gallops  Respiratory: CTA bilaterally  Abdomen: soft, NT, ND, positive BS  Musculoskeletal: no edema or cyanosis  Data Reviewed: Basic Metabolic Panel:  Recent Labs Lab 08/07/14 2307 08/08/14 0145 08/08/14 0412  08/08/14 0744 08/08/14 1100  NA 138 140 141 139 143  K 5.6* 3.8 3.8 3.6* 3.7  CL 101 100 101 98 104  CO2 22 22 23 25 25   GLUCOSE 190* 163* 209* 148* 160*  BUN 15 14 14 14 13   CREATININE 0.86 0.88 0.99 1.06 0.97  CALCIUM 10.0 10.0 9.9 10.1 9.8   Liver Function Tests:  Recent Labs Lab 08/07/14 1453  AST 19  ALT 21  ALKPHOS 137*  BILITOT 0.3  PROT 9.0*  ALBUMIN 4.5   CBC:  Recent Labs Lab 08/07/14 1453 08/07/14 1955  WBC 6.6 8.3  HGB 15.8 15.4  HCT 45.3 43.6  MCV 76.3* 74.7*  PLT 281 301   Cardiac Enzymes: No results for input(s): CKTOTAL, CKMB, CKMBINDEX, TROPONINI in the last 168 hours. BNP (last 3 results) No results for input(s): PROBNP in the last 8760 hours. CBG:  Recent Labs Lab 08/08/14 0749 08/08/14 0856 08/08/14 1004 08/08/14 1116 08/08/14 1223  GLUCAP 143* 170* 226* 196* 107*    Recent Results (from the past 240 hour(s))  MRSA PCR Screening     Status: None   Collection Time: 08/07/14  7:10 PM  Result Value Ref Range Status   MRSA by PCR NEGATIVE NEGATIVE Final    Comment:        The GeneXpert MRSA Assay (FDA approved for NASAL specimens only), is one component of a comprehensive MRSA colonization surveillance program. It is not intended to diagnose MRSA infection nor to guide or monitor treatment for MRSA infections.      Studies: No  results found.  Scheduled Meds: . heparin  5,000 Units Subcutaneous 3 times per day  . insulin aspart  0-15 Units Subcutaneous TID WC  . insulin aspart  4 Units Subcutaneous TID WC  . insulin aspart protamine- aspart  12 Units Subcutaneous BID WC  . insulin glargine  15 Units Subcutaneous Once   Continuous Infusions: . sodium chloride    . insulin (NOVOLIN-R) infusion 1.4 Units/hr (08/08/14 1226)    Principal Problem:   DKA (diabetic ketoacidoses) Active Problems:   Phimosis   Hyponatremia   DKA, type 1    Time spent: 30 minutes (more than 50% of the time in direct contact with patient  regarding education on his condition and explanation of care; also coordinating his work up)    [name], [name]  [facility] Pager [phone]. If 7PM-7AM, please contact night-coverage at [email or web address], password [email or web address] 08/08/2014, 1:04 PM  LOS: 1 day

## 2014-08-08 NOTE — Progress Notes (Signed)
Nutrition Brief Note  Patient identified on the Malnutrition Screening Tool (MST) Report  Wt Readings from Last 15 Encounters:  08/08/14 242 lb 4.6 oz (109.9 kg)    There is no height on file to calculate BMI.   Current diet order is NPO. Labs and medications reviewed.   Pt reported an intentional and unintentional weight loss of 10 lbs in the past few months. Has started a new job that has resulted in a lot of traveling and a subsequent increase in fast food intake. Diet recall indicates pt consuming two meals daily, with at least one being fast food. Pt also reported drinking soda and sweet teas that have contributed to uncontrolled blood glucose. Reviewed importance of diet modifications and discussed possible lunch and/or snacks pt could pack vs fast food every day  Diabetes coordinator has been consulted. Encouraged pt to contact RD for additional nutrition education No additional nutrition interventions warranted at this time. If nutrition issues arise, please consult RD.   Lloyd HugerSarah F Uldine Fuster MS RD LDN Clinical Dietitian Pager:[phone]

## 2014-08-08 NOTE — Plan of Care (Signed)
Problem: Phase I Progression Outcomes Goal: Nausea/vomiting controlled with antiemetics Outcome: Not Applicable Date Met:  63/84/53 Pt has not had nausea or vomiting this shift.

## 2014-08-08 NOTE — Plan of Care (Signed)
Problem: Phase I Progression Outcomes Goal: Voiding-avoid urinary catheter unless indicated Outcome: Completed/Met Date Met:  08/08/14 Pt voided 880 during shift in urinal.

## 2014-08-08 NOTE — Plan of Care (Signed)
Problem: Phase I Progression Outcomes Goal: CBGs steadily decreasing on IV insulin drip Outcome: Progressing Goal: Acidosis resolving Outcome: Completed/Met Date Met:  08/08/14

## 2014-08-08 NOTE — Progress Notes (Signed)
Inpatient Diabetes Program Recommendations  AACE/ADA: New Consensus Statement on Inpatient Glycemic Control (2013)  Target Ranges:  Prepandial:   less than 140 mg/dL      Peak postprandial:   less than 180 mg/dL (1-2 hours)      Critically ill patients:  140 - 180 mg/dL   Reason for Visit: Diabetes Consult  Diabetes history: DM2 Outpatient Diabetes medications: None Current orders for Inpatient glycemic control: IV insulin / GlucoStabilizer  23 y.o. male with pmh of diabetes. Presented with polyurea, polyphagia, polydypsia and sugar more than 900.   No PCP. Will likely need to f/u regarding diabetes management at [facility]. AG - 16. Will be in this afternoon to discuss HgbA1C and starting on insulin. Discussed with RN.  Case manager consult needed. When transitioning to SQ insulin - Give Lantus 2 hours prior to discontinuation of insulin drip.   Inpatient Diabetes Program Recommendations Insulin - IV drip/GlucoStabilizer: Continue with GlucoStabilizer until criteria met for transition to basal-bolus insulin Insulin - Basal: Give Lantus 25 units 2 hour prior to discontinuation of insulin drip. Correction (SSI): Novolog resistant tidwc and hs Insulin - Meal Coverage: Novolog 4 units tidwc for meal coverage insulin if pt eats >50% meal HgbA1C: 13.3% - uncontrolled Outpatient Referral: OP Diabetes Education consult for uncontrolled DM Diet: CHO mod med  Note: Will follow up with pt regarding HgbA1C and insulin this afternoon. Thank you. Lorenda Peck, RD, LDN, CDE Inpatient Diabetes Coordinator [phone]

## 2014-08-08 NOTE — Progress Notes (Signed)
[facility] [place],  Will be sending information about [name] [employer] program, to help patient establish primary care, as well as, a list of self-pay PCP, to address provided.

## 2014-08-08 NOTE — Progress Notes (Signed)
Patient had eaten prior to blood sugar being checked.  When blood sugar checked, it was 520.  Dr. Gwenlyn PerkingMadera notified, with orders to cover blood sugar with 20 units, in addition to 70/30 insulin coverage.  Philomena Dohenyavid Mamoru Takeshita RN

## 2014-08-08 NOTE — Care Management Note (Signed)
    Page 1 of 2   08/08/2014     12:14:18 PM CARE MANAGEMENT NOTE 08/08/2014  Patient:  Jonathan Porter,Jonathan J   Account Number:  [id number]  Date Initiated:  08/08/2014  Documentation initiated by:  Taja Pentland  Subjective/Objective Assessment:   23 y.o. male with pmh of diabetes. Presented with polyurea, polyphagia, polydepsia and sugar more than 900 . Patient is calm, denies CP, SOB, fever, Chills, cough also denies  Dysurea. Patient in ED found with CBG's > 900 and elevated anion     Action/Plan:   home when stable   Anticipated DC Date:  08/11/2014   Anticipated DC Plan:  HOME/SELF CARE  In-house referral  Financial Counselor      DC Planning Services  CM consult  Medication Assistance      [facility] Choice  NA   Choice offered to / List presented to:  NA   DME arranged  NA      DME agency  NA     HH arranged  NA      HH agency  NA   Status of service:  In process, will continue to follow Medicare Important Message given?   (If response is "NO", the following Medicare IM given date fields will be blank) Date Medicare IM given:   Medicare IM given by:   Date Additional Medicare IM given:   Additional Medicare IM given by:    Discharge Disposition:    Per UR Regulation:  Reviewed for med. necessity/level of care/duration of stay  If discussed at Long Length of Stay Meetings, dates discussed:    Comments:  [id number]/Elonda Giuliano Earlene Plateravis, RN, BSN, CCM Chart reviewed. Discharge needs and patient's stay to be reviewed and followed by case manager. Chart note for progression of stay: 1-DKA (diabetic ketoacidoses): patient with hx of diabetes for the last 7 years or so; initially on insulin and according to him, subsequently transition to oral agents. For the last 2 year or so has not been on any meds and with diet indiscretion. Patient presented with polydipsia, polyphagia and polyurea. -CBG's in the 900 range and anion gap of 23 -will admit to stepdown and will start insulin  drip -close follow up of electrolytes -NPO -follow DKA protocol orders -will check UDS and A1C -will ask diabetes coordinator to provide education and assist with discharge medication regimen 2-Phimosis: with some excoriations, but w/o signs of superimposed infections -will need urology follow up as an outpatient 3-Hyponatremia: due to hyperglycemia -will follow electrolytes trend -continue insulin and IVF's 4-DKA, type 1: uncontrolled Will need insulin therapy at discharge most likely -will monitor and discussed with diabetes coordinator -A1C 1 11.1

## 2014-08-08 NOTE — Progress Notes (Signed)
Results for BACILIO, ABASCAL (MRN [id number]) as of 08/08/2014 14:49  Ref. Range 08/08/2014 07:49 08/08/2014 08:56 08/08/2014 10:04 08/08/2014 11:16 08/08/2014 12:23  Glucose-Capillary Latest Range: 70-99 mg/dL 143 (H) 170 (H) 226 (H) 196 (H) 107 (H)   Pt transitioning off insulin drip and was given Lantus 15 units 2 hours ago. Pt states he has been on insulin in the past, but lost approx 200 pounds and no longer needed it. In past month, pt started new job with [employer] and started drinking regular sodas, large quantities of fast foods, sweets, and was eating late into the night. "I knew I shouldn't be doing that, but I did anyway." Discussed HgbA1C results, importance of controlling blood sugars, obtaining PCP to manage diabetes, blood glucose monitoring, diet, exercise and what effects they have on blood sugars. Interested in OP Diabetes Education. Will need affordable insulin at discharge since pt has no insurance at this time. Hoping to get insurance with company he started working for.  Recommendations: 70/30 20 units bid and titrate until blood sugars <180 mg/dL D/C Novolog meal coverage insulin since 70/30 has meal coverage in it.  Will order Insulin Starter Kit and Living Well With Diabetes book. F/U in am with questions. Pt to purchase Re[employer]ion meter at [employer].  Discussed with RN. Thank you. Lorenda Peck, RD, LDN, CDE Inpatient Diabetes Coordinator [phone]

## 2014-08-08 NOTE — Plan of Care (Signed)
Problem: Phase I Progression Outcomes Goal: Pain controlled with appropriate interventions Outcome: Not Applicable Date Met:  76/54/65 Pt stated no pain throughout night shift.

## 2014-08-08 NOTE — Plan of Care (Signed)
Problem: Phase I Progression Outcomes Goal: NPO or per MD order Outcome: Completed/Met Date Met:  08/08/14 NPO

## 2014-08-08 NOTE — Progress Notes (Signed)
Gave report to PPG Industriesdavid rn on 5E

## 2014-08-09 DIAGNOSIS — E1310 Other specified diabetes mellitus with ketoacidosis without coma: Secondary | ICD-10-CM | POA: Insufficient documentation

## 2014-08-09 LAB — BASIC METABOLIC PANEL
Anion gap: 14 (ref 5–15)
BUN: 15 mg/dL (ref 6–23)
CHLORIDE: 101 meq/L (ref 96–112)
CO2: 23 meq/L (ref 19–32)
Calcium: 9.1 mg/dL (ref 8.4–10.5)
Creatinine, Ser: 0.99 mg/dL (ref 0.50–1.35)
GFR calc non Af Amer: >90 mL/min (ref >90)
GLUCOSE: 225 mg/dL — AB (ref 70–99)
POTASSIUM: 3.8 meq/L (ref 3.7–5.3)
Sodium: 138 mEq/L (ref 137–147)

## 2014-08-09 LAB — GLUCOSE, CAPILLARY: GLUCOSE-CAPILLARY: 246 mg/dL — AB (ref 70–99)

## 2014-08-09 MED ORDER — INSULIN ASPART PROT & ASPART (70-30 MIX) 100 UNIT/ML ~~LOC~~ SUSP: 20.0000 [IU] | Freq: Two times a day (BID) | SUBCUTANEOUS | Status: DC

## 2014-08-09 MED ORDER — BLOOD GLUCOSE MONITOR KIT: PACK | Status: DC

## 2014-08-09 NOTE — Plan of Care (Signed)
Problem: Discharge Progression Outcomes Goal: Activity appropriate for discharge plan Outcome: Completed/Met Date Met:  08/09/14

## 2014-08-09 NOTE — Progress Notes (Signed)
CARE MANAGEMENT NOTE 08/09/2014  Patient:  Jonathan Porter,Jonathan Porter   Account Number:  [id number]  Date Initiated:  08/08/2014  Documentation initiated by:  DAVIS,RHONDA  Subjective/Objective Assessment:   23 y.o. male with pmh of diabetes. Presented with polyurea, polyphagia, polydepsia and sugar more than 900 . Patient is calm, denies CP, SOB, fever, Chills, cough also denies  Dysurea. Patient in ED found with CBG's > 900 and elevated anion     Action/Plan:   home when stable   Anticipated DC Date:  08/11/2014   Anticipated DC Plan:  HOME/SELF CARE  In-house referral  Financial Counselor      DC Planning Services  CM consult  Medication Assistance  PCP issues      PAC Choice  NA   Choice offered to / List presented to:  NA   DME arranged  NA      DME agency  NA     HH arranged  NA      HH agency  NA   Status of service:  Completed, signed off Medicare Important Message given?   (If response is "NO", the following Medicare IM given date fields will be blank) Date Medicare IM given:   Medicare IM given by:   Date Additional Medicare IM given:   Additional Medicare IM given by:    Discharge Disposition:  HOME/SELF CARE  Per UR Regulation:  Reviewed for med. necessity/level of care/duration of stay  If discussed at Long Length of Stay Meetings, dates discussed:    Comments:  08/09/14 Ferdinand CavaAndrea Schettino RN, BSN, CM [facility] with [facility]. [facility] with [facility] information and [facility] once discharged that they will fill prescriptions for DM supplies at a most affordable rate. Patient verbalized understanding and had no other question, concern, or CM needs identified.  [id number]/Rhonda Earlene Plateravis, RN, BSN, CCM Chart reviewed. Discharge needs and patient's stay to be reviewed and followed by case manager. Chart note for progression of stay: 1-DKA (diabetic  ketoacidoses): patient with hx of diabetes for the last 7 years or so; initially on insulin and according to him, subsequently transition to oral agents. For the last 2 year or so has not been on any meds and with diet indiscretion. Patient presented with polydipsia, polyphagia and polyurea. -CBG's in the 900 range and anion gap of 23 -will admit to stepdown and will start insulin drip -close follow up of electrolytes -NPO -follow DKA protocol orders -will check UDS and A1C -will ask diabetes coordinator to provide education and assist with discharge medication regimen 2-Phimosis: with some excoriations, but w/o signs of superimposed infections -will need urology follow up as an outpatient 3-Hyponatremia: due to hyperglycemia -will follow electrolytes trend -continue insulin and IVF's 4-DKA, type 1: uncontrolled Will need insulin therapy at discharge most likely -will monitor and discussed with diabetes coordinator -A1C 1 11.1

## 2014-08-09 NOTE — Progress Notes (Signed)
F/U to answer questions regarding diabetes management. Pt is to give his own insulin injection before lunch meal. Has given insulin via pen and syringe in the past and feels comfortable with this. Discussed HgbA1C, reducing risk of complications, hypoglycemia s/s and treatment, rotating sites, making healthy choices when eating out, and trying to be consistent with eating at regular times. To be discharged on 70/30 20 units bid and Novolog s/s. [facility]. Instructed pt to take logbook of CBGs to MD appt for any necessary adjustments.  Discussed with RN. Thank you. Ailene Ardshonda Kylynn Street, RD, LDN, CDE Inpatient Diabetes Coordinator [phone]

## 2014-08-09 NOTE — Progress Notes (Signed)
Reviewed discharge instructions and F/U appointment with patient. Prescriptions were given, patient verbalized understanding of instructions. IV was removed, pt. VSS, accompanied home by significant other.

## 2014-08-09 NOTE — Plan of Care (Signed)
Problem: Discharge Progression Outcomes Goal: Discharge plan in place and appropriate Outcome: Adequate for Discharge     

## 2014-08-09 NOTE — Discharge Instructions (Signed)

## 2014-08-09 NOTE — Plan of Care (Signed)
Problem: Discharge Progression Outcomes Goal: Discharge plan in place and appropriate Outcome: Completed/Met Date Met:  08/09/14     

## 2014-08-09 NOTE — Plan of Care (Signed)
Problem: Discharge Progression Outcomes Goal: Barriers To Progression Addressed/Resolved Outcome: Completed/Met Date Met:  08/09/14     

## 2014-08-09 NOTE — Plan of Care (Signed)
Problem: Discharge Progression Outcomes Goal: Hemodynamically stable Outcome: Completed/Met Date Met:  08/09/14     

## 2014-08-09 NOTE — Plan of Care (Signed)
Problem: Discharge Progression Outcomes Goal: Complications resolved/controlled Outcome: Adequate for Discharge     

## 2014-08-09 NOTE — Plan of Care (Signed)
Problem: Discharge Progression Outcomes Goal: Other Discharge Outcomes/Goals Outcome: Completed/Met Date Met:  08/09/14     

## 2014-08-09 NOTE — Discharge Summary (Signed)
Physician Discharge Summary  Jonathan Porter HYW:[id number] DOB: May 05, 1991 DOA: 08/07/2014  PCP: No primary care provider on file.  Admit date: 08/07/2014 Discharge date: 08/09/2014  Recommendations for Outpatient Follow-up:  1. Pt will need to follow up with PCP in 2-3 weeks post discharge 2. Please obtain BMP to evaluate electrolytes and kidney function 3. Please also check CBC to evaluate Hg and Hct levels 4. Please readjust the insulin regimen as clinically indicated   Discharge Diagnoses:  Principal Problem:   DKA (diabetic ketoacidoses) Active Problems:   Phimosis   Hyponatremia   DKA, type 1    Discharge Condition: Stable  Diet recommendation: Heart healthy diet discussed in details   History of present illness:  Pt admitted with nausea, vomiting, poor oral intake, noted to be in DKA.  Hospital Course:   Assessment/Plan: 1-DKA (diabetic ketoacidoses): patient with hx of diabetes for the last 7 years or so; initially on insulin and according to him, subsequently transition to oral agents. For the last 2 years or so has not been on any meds and with diet indiscretion. Patient presented with polydipsia, polyphagia and polyurea. -CBG's less than 200 in 3 consecutives readings; bicarb 23 and anion gap 14 -started low carb diet and tolerating well   2-Phimosis: with some excoriations, but w/o signs of superimposed infections -will need urology follow up as an outpatient  3-Hyponatremia: due to hyperglycemia -resolved with IVF's and control of CBG's  4-DKA, type 1: uncontrolled -continue treatment with 70/30 upon discharge as noted below    Procedures/Studies:  None Consultations:  None Antibiotics:  None  Discharge Exam: Filed Vitals:   08/09/14 0536  BP: 109/65  Pulse: 63  Temp: 97.5 F (36.4 C)  Resp: 16   Filed Vitals:   08/08/14 1714 08/08/14 2101 08/09/14 0159 08/09/14 0536  BP: 148/72 126/87 123/66 109/65  Pulse: 78 70 74 63  Temp: 97.9  F (36.6 C) 97.5 F (36.4 C) 98 F (36.7 C) 97.5 F (36.4 C)  TempSrc: Oral Oral Oral Oral  Resp: _0 Height:      Weight:      SpO2: 99% 100% 100% 100%    General: Pt is alert, follows commands appropriately, not in acute distress Cardiovascular: Regular rate and rhythm, S1/S2 +, no murmurs, no rubs, no gallops Respiratory: Clear to auscultation bilaterally, no wheezing, no crackles, no rhonchi Abdominal: Soft, non tender, non distended, bowel sounds +, no guarding Extremities: no edema, no cyanosis, pulses palpable bilaterally DP and PT Neuro: Grossly nonfocal  Discharge Instructions  Discharge Instructions    Diet - low sodium heart healthy    Complete by:  As directed      Increase activity slowly    Complete by:  As directed             Medication List    TAKE these medications        blood glucose meter kit and supplies Kit  - Dispense based on patient and insurance preference. Use up to four times daily as directed.   - Provide True Result brand.     insulin aspart protamine- aspart (70-30) 100 UNIT/ML injection  Commonly known as:  NOVOLOG MIX 70/30  Inject 0.2 mLs (20 Units total) into the skin 2 (two) times daily with a meal.           Follow-up Information    Follow up with Faye Ramsay, MD.   Specialty:  Internal Medicine   Why:  As needed, If symptoms worsen   Contact information:   [address] [place] [address] [phone]        The results of significant diagnostics from this hospitalization (including imaging, microbiology, ancillary and laboratory) are listed below for reference.     Microbiology: Recent Results (from the past 240 hour(s))  MRSA PCR Screening     Status: None   Collection Time: 08/07/14  7:10 PM  Result Value Ref Range Status   MRSA by PCR NEGATIVE NEGATIVE Final    Comment:        The GeneXpert MRSA Assay (FDA approved for NASAL specimens only), is one component of  a comprehensive MRSA colonization surveillance program. It is not intended to diagnose MRSA infection nor to guide or monitor treatment for MRSA infections.      Labs: Basic Metabolic Panel:  Recent Labs Lab 08/08/14 0412 08/08/14 0744 08/08/14 1100 08/08/14 1748 08/09/14 0446  NA 141 139 143 137 138  K 3.8 3.6* 3.7 3.9 3.8  CL 101 98 104 98 101  CO2 _0 GLUCOSE 209* 148* 160* 504* 225*  BUN _1 CREATININE 0.99 1.06 0.97 1.14 0.99  CALCIUM 9.9 10.1 9.8 9.8 9.1   Liver Function Tests:  Recent Labs Lab 08/07/14 1453  AST 19  ALT 21  ALKPHOS 137*  BILITOT 0.3  PROT 9.0*  ALBUMIN 4.5   No results for input(s): LIPASE, AMYLASE in the last 168 hours. No results for input(s): AMMONIA in the last 168 hours. CBC:  Recent Labs Lab 08/07/14 1453 08/07/14 1955  WBC 6.6 8.3  HGB 15.8 15.4  HCT 45.3 43.6  MCV 76.3* 74.7*  PLT 281 301   Cardiac Enzymes: No results for input(s): CKTOTAL, CKMB, CKMBINDEX, TROPONINI in the last 168 hours. BNP: BNP (last 3 results) No results for input(s): PROBNP in the last 8760 hours. CBG:  Recent Labs Lab 08/08/14 1223 08/08/14 1330 08/08/14 1815 08/08/14 2103 08/09/14 0728  GLUCAP 107* 132* 520* 329* 246*     SIGNED: Time coordinating discharge: Over 30 minutes  Faye Ramsay, MD  [facility] 08/09/2014, 9:52 AM Pager [phone]  If 7PM-7AM, please contact night-coverage [email or web address] Password [email or web address]

## 2014-08-09 NOTE — Plan of Care (Signed)
Problem: Discharge Progression Outcomes Goal: Pt. states knowledge of home DM medications Outcome: Completed/Met Date Met:  08/09/14

## 2014-08-09 NOTE — Plan of Care (Signed)
Problem: Discharge Progression Outcomes Goal: Tolerating diet Outcome: Completed/Met Date Met:  08/09/14     

## 2014-08-09 NOTE — Plan of Care (Signed)
Problem: Discharge Progression Outcomes Goal: CBGs controlled on DM discharge meds Outcome: Completed/Met Date Met:  08/09/14

## 2014-08-09 NOTE — Plan of Care (Signed)
Problem: Discharge Progression Outcomes Goal: Obtain signed CBG meter Rx form Outcome: Completed/Met Date Met:  08/09/14     

## 2014-08-09 NOTE — Plan of Care (Signed)
Problem: Discharge Progression Outcomes Goal: Complications resolved/controlled Outcome: Completed/Met Date Met:  08/09/14     

## 2014-08-09 NOTE — Plan of Care (Signed)
Problem: Discharge Progression Outcomes Goal: Pain controlled with appropriate interventions Outcome: Completed/Met Date Met:  08/09/14

## 2014-08-10 LAB — GLUCOSE, CAPILLARY: GLUCOSE-CAPILLARY: 280 mg/dL — AB (ref 70–99)

## 2014-08-11 ENCOUNTER — Encounter: Payer: Self-pay | Admitting: Internal Medicine

## 2014-08-11 ENCOUNTER — Ambulatory Visit: Payer: Self-pay | Attending: Internal Medicine

## 2014-08-11 ENCOUNTER — Ambulatory Visit: Payer: Self-pay | Attending: Internal Medicine | Admitting: Internal Medicine

## 2014-08-11 VITALS — BP 127/78 | HR 75 | Temp 98.0°F | Resp 16 | Wt 255.4 lb

## 2014-08-11 DIAGNOSIS — Z139 Encounter for screening, unspecified: Secondary | ICD-10-CM

## 2014-08-11 DIAGNOSIS — Z23 Encounter for immunization: Secondary | ICD-10-CM

## 2014-08-11 DIAGNOSIS — F17210 Nicotine dependence, cigarettes, uncomplicated: Secondary | ICD-10-CM | POA: Insufficient documentation

## 2014-08-11 DIAGNOSIS — E119 Type 2 diabetes mellitus without complications: Secondary | ICD-10-CM | POA: Insufficient documentation

## 2014-08-11 DIAGNOSIS — Z794 Long term (current) use of insulin: Secondary | ICD-10-CM | POA: Insufficient documentation

## 2014-08-11 DIAGNOSIS — E0810 Diabetes mellitus due to underlying condition with ketoacidosis without coma: Secondary | ICD-10-CM

## 2014-08-11 NOTE — Patient Instructions (Signed)
Diabetes Mellitus and Food It is important for you to manage your blood sugar (glucose) level. Your blood glucose level can be greatly affected by what you eat. Eating healthier foods in the appropriate amounts throughout the day at about the same time each day will help you control your blood glucose level. It can also help slow or prevent worsening of your diabetes mellitus. Healthy eating may even help you improve the level of your blood pressure and reach or maintain a healthy weight.  HOW CAN FOOD AFFECT ME? Carbohydrates Carbohydrates affect your blood glucose level more than any other type of food. Your dietitian will help you determine how many carbohydrates to eat at each meal and teach you how to count carbohydrates. Counting carbohydrates is important to keep your blood glucose at a healthy level, especially if you are using insulin or taking certain medicines for diabetes mellitus. Alcohol Alcohol can cause sudden decreases in blood glucose (hypoglycemia), especially if you use insulin or take certain medicines for diabetes mellitus. Hypoglycemia can be a life-threatening condition. Symptoms of hypoglycemia (sleepiness, dizziness, and disorientation) are similar to symptoms of having too much alcohol.  If your health care provider has given you approval to drink alcohol, do so in moderation and use the following guidelines:  Women should not have more than one drink per day, and men should not have more than two drinks per day. One drink is equal to:  12 oz of beer.  5 oz of wine.  1 oz of hard liquor.  Do not drink on an empty stomach.  Keep yourself hydrated. Have water, diet soda, or unsweetened iced tea.  Regular soda, juice, and other mixers might contain a lot of carbohydrates and should be counted. WHAT FOODS ARE NOT RECOMMENDED? As you make food choices, it is important to remember that all foods are not the same. Some foods have fewer nutrients per serving than other  foods, even though they might have the same number of calories or carbohydrates. It is difficult to get your body what it needs when you eat foods with fewer nutrients. Examples of foods that you should avoid that are high in calories and carbohydrates but low in nutrients include:  Trans fats (most processed foods list trans fats on the Nutrition Facts label).  Regular soda.  Juice.  Candy.  Sweets, such as cake, pie, doughnuts, and cookies.  Fried foods. WHAT FOODS CAN I EAT? Have nutrient-rich foods, which will nourish your body and keep you healthy. The food you should eat also will depend on several factors, including:  The calories you need.  The medicines you take.  Your weight.  Your blood glucose level.  Your blood pressure level.  Your cholesterol level. You also should eat a variety of foods, including:  Protein, such as meat, poultry, fish, tofu, nuts, and seeds (lean animal proteins are best).  Fruits.  Vegetables.  Dairy products, such as milk, cheese, and yogurt (low fat is best).  Breads, grains, pasta, cereal, rice, and beans.  Fats such as olive oil, trans fat-free margarine, canola oil, avocado, and olives. DOES EVERYONE WITH DIABETES MELLITUS HAVE THE SAME MEAL PLAN? Because every person with diabetes mellitus is different, there is not one meal plan that works for everyone. It is very important that you meet with a dietitian who will help you create a meal plan that is just right for you. Document Released: 06/05/2005 Document Revised: 09/13/2013 Document Reviewed: 08/05/2013 Exit[employer] Patient Information 2015 [employer]. This   information is not intended to replace advice given to you by your health care provider. Make sure you discuss any questions you have with your health care provider.  

## 2014-08-11 NOTE — Progress Notes (Signed)
Patient here for hospital follow up on his diabetes Was admitted with blood sugars over 800 Recently put back on novolog 70/30

## 2014-08-11 NOTE — Progress Notes (Signed)
Patient Demographics  Jonathan Porter, is a 23 y.o. male  SNK:[id number]  PFX:[id number]  DOB - 1991/07/01  CC:  Chief Complaint  Patient presents with  . Hospitalization Follow-up       HPI: Jonathan Porter is a 23 y.o. male here today to establish medical care.Patient has history of diabetes for the last 7 years, recently hospitalized found to be in DKA apparently patient was not taking his medications? As per patient he was diagnosed with type 2 diabetes, he presented with symptoms of polyuria polydipsia, had an and Initially of 23, treated with IV fluids and insulin was discharged on 70/30 to take 20 units twice a day, patient also had phimosis with some excoriation and patient was advised to follow with urology. Currently patient denies any symptoms as per patient he has already eaten today his blood sugar was elevated. Patient has No headache, No chest pain, No abdominal pain - No Nausea, No new weakness tingling or numbness, No Cough - SOB.  No Known Allergies Past Medical History  Diagnosis Date  . Diabetes mellitus without complication    Current Outpatient Prescriptions on File Prior to Visit  Medication Sig Dispense Refill  . Blood Glucose Monitoring Suppl (BLOOD GLUCOSE METER KIT AND SUPPLIES) KIT Dispense based on patient and insurance preference. Use up to four times daily as directed.  Provide True Result brand. 1 each 0  . insulin aspart protamine- aspart (NOVOLOG MIX 70/30) (70-30) 100 UNIT/ML injection Inject 0.2 mLs (20 Units total) into the skin 2 (two) times daily with a meal. 10 mL 11   No current facility-administered medications on file prior to visit.   Family History  Problem Relation Age of Onset  . Diabetes Maternal Aunt   . Diabetes Maternal Grandmother   . Diabetes Maternal Grandfather   . Thyroid disease    . Thyroid disease Mother    History   Social History  . Marital Status: Single    Spouse Name: N/A    Number of Children: N/A  .  Years of Education: N/A   Occupational History  . Not on file.   Social History Main Topics  . Smoking status: Current Every Day Smoker -- 0.25 packs/day for 6 years    Types: Cigarettes  . Smokeless tobacco: Never Used  . Alcohol Use: No  . Drug Use: No  . Sexual Activity: Not on file   Other Topics Concern  . Not on file   Social History Narrative    Review of Systems: Constitutional: Negative for fever, chills, diaphoresis, activity change, appetite change and fatigue. HENT: Negative for ear pain, nosebleeds, congestion, facial swelling, rhinorrhea, neck pain, neck stiffness and ear discharge.  Eyes: Negative for pain, discharge, redness, itching and visual disturbance. Respiratory: Negative for cough, choking, chest tightness, shortness of breath, wheezing and stridor.  Cardiovascular: Negative for chest pain, palpitations and leg swelling. Gastrointestinal: Negative for abdominal distention. Genitourinary: Negative for dysuria, urgency, frequency, hematuria, flank pain, decreased urine volume, difficulty urinating and dyspareunia.  Musculoskeletal: Negative for back pain, joint swelling, arthralgia and gait problem. Neurological: Negative for dizziness, tremors, seizures, syncope, facial asymmetry, speech difficulty, weakness, light-headedness, numbness and headaches.  Hematological: Negative for adenopathy. Does not bruise/bleed easily. Psychiatric/Behavioral: Negative for hallucinations, behavioral problems, confusion, dysphoric mood, decreased concentration and agitation.    Objective:   Filed Vitals:   08/11/14 0949  BP: 127/78  Pulse: 75  Temp: 98 F (36.7 C)  Resp: 16    Physical Exam:  Constitutional: Patient appears well-developed and well-nourished. No distress. HENT: Normocephalic, atraumatic, External right and left ear normal. Oropharynx is clear and moist.  Eyes: Conjunctivae and EOM are normal. PERRLA, no scleral icterus. Neck: Normal ROM. Neck  supple. No JVD. No tracheal deviation. No thyromegaly. CVS: RRR, S1/S2 +, no murmurs, no gallops, no carotid bruit.  Pulmonary: Effort and breath sounds normal, no stridor, rhonchi, wheezes, rales.  Abdominal: Soft. BS +, no distension, tenderness, rebound or guarding.  Musculoskeletal: Normal range of motion. No edema and no tenderness.  Neuro: Alert. Normal reflexes, muscle tone coordination. No cranial nerve deficit. Skin: Skin is warm and dry. No rash noted. Not diaphoretic. No erythema. No pallor. Psychiatric: Normal mood and affect. Behavior, judgment, thought content normal.  Lab Results  Component Value Date   WBC 8.3 08/07/2014   HGB 15.4 08/07/2014   HCT 43.6 08/07/2014   MCV 74.7* 08/07/2014   PLT 301 08/07/2014   Lab Results  Component Value Date   CREATININE 0.99 08/09/2014   BUN 15 08/09/2014   NA 138 08/09/2014   K 3.8 08/09/2014   CL 101 08/09/2014   CO2 23 08/09/2014    Lab Results  Component Value Date   HGBA1C 13.3* 08/07/2014   Lipid Panel     Component Value Date/Time   CHOL * 08/31/2007 0440    339        ATP III CLASSIFICATION:  <200     mg/dL   Desirable  200-239  mg/dL   Borderline High  >=240    mg/dL   High   TRIG 260* 08/31/2007 0440   HDL 39 08/31/2007 0440   CHOLHDL 8.7 08/31/2007 0440   VLDL 52* 08/31/2007 0440   LDLCALC * 08/31/2007 0440    248        Total Cholesterol/HDL:CHD Risk Coronary Heart Disease Risk Table                     Men   Women  1/2 Average Risk   3.4   3.3       Assessment and plan:   1. Diabetes mellitus due to underlying condition with ketoacidosis without coma Patient is advised for diabetes meal planning. Continue with insulin 70/3020 units twice a day, he will keep the fingerstick log , come back in 2 weeks for the nurse visit. As well as to the fasting lipid panel. - COMPLETE METABOLIC PANEL WITH GFR; Future - C-peptide; Future - Glutamic acid decarboxylase auto abs; Future - Anti-islet cell  antibody; Future  2. Screening  - Lipid panel; Future  3. Needs flu shot Flu shot given today  4. Need for prophylactic vaccination against Streptococcus pneumoniae (pneumococcus) Pneumovax given today.      Health Maintenance  -Vaccinations:  Flu shot and pneumovax today   Return in about 3 months (around 11/11/2014) for diabetes, CBG check in 2 weeks/Nurse Visit.   Lorayne Marek, MD

## 2014-09-08 ENCOUNTER — Ambulatory Visit: Payer: Self-pay

## 2014-09-08 ENCOUNTER — Other Ambulatory Visit: Payer: Self-pay

## 2014-09-21 ENCOUNTER — Ambulatory Visit: Payer: Self-pay

## 2014-09-21 ENCOUNTER — Other Ambulatory Visit: Payer: Self-pay

## 2014-09-25 ENCOUNTER — Other Ambulatory Visit: Payer: Self-pay

## 2014-10-02 ENCOUNTER — Other Ambulatory Visit: Payer: Self-pay

## 2014-10-02 MED ORDER — INSULIN ASPART PROT & ASPART (70-30 MIX) 100 UNIT/ML ~~LOC~~ SUSP: 20.0000 [IU] | Freq: Two times a day (BID) | SUBCUTANEOUS | Status: DC

## 2014-10-09 ENCOUNTER — Ambulatory Visit: Payer: Self-pay

## 2014-11-21 ENCOUNTER — Encounter (HOSPITAL_COMMUNITY): Payer: Self-pay | Admitting: Emergency Medicine

## 2014-11-21 DIAGNOSIS — E860 Dehydration: Secondary | ICD-10-CM | POA: Insufficient documentation

## 2014-11-21 DIAGNOSIS — E876 Hypokalemia: Secondary | ICD-10-CM | POA: Insufficient documentation

## 2014-11-21 DIAGNOSIS — R51 Headache: Secondary | ICD-10-CM | POA: Insufficient documentation

## 2014-11-21 DIAGNOSIS — Z760 Encounter for issue of repeat prescription: Secondary | ICD-10-CM | POA: Insufficient documentation

## 2014-11-21 DIAGNOSIS — Z794 Long term (current) use of insulin: Secondary | ICD-10-CM | POA: Insufficient documentation

## 2014-11-21 DIAGNOSIS — E871 Hypo-osmolality and hyponatremia: Secondary | ICD-10-CM | POA: Insufficient documentation

## 2014-11-21 DIAGNOSIS — E1165 Type 2 diabetes mellitus with hyperglycemia: Secondary | ICD-10-CM | POA: Insufficient documentation

## 2014-11-21 DIAGNOSIS — M791 Myalgia: Secondary | ICD-10-CM | POA: Insufficient documentation

## 2014-11-21 DIAGNOSIS — Z720 Tobacco use: Secondary | ICD-10-CM | POA: Insufficient documentation

## 2014-11-21 LAB — CBG MONITORING, ED: Glucose-Capillary: 245 mg/dL — ABNORMAL HIGH (ref 70–99)

## 2014-11-21 NOTE — ED Notes (Signed)
Pt godmother states he's been nauseous, vomiting all day. Hx of diabetes, she states he has not been monitoring his sugars recently. Pt complains of severe headache, weakness, and nausea starting this morning and getting worse throughout day.

## 2014-11-21 NOTE — ED Notes (Signed)
CBG 245. 

## 2014-11-21 NOTE — ED Notes (Signed)
Called pt twice for lab draw no answer

## 2014-11-22 ENCOUNTER — Emergency Department (HOSPITAL_COMMUNITY)
Admission: EM | Admit: 2014-11-22 | Discharge: 2014-11-22 | Disposition: A | Discharge status: Home / Self Care | Payer: Self-pay | Attending: Emergency Medicine | Admitting: Emergency Medicine

## 2014-11-22 DIAGNOSIS — E876 Hypokalemia: Secondary | ICD-10-CM

## 2014-11-22 DIAGNOSIS — E860 Dehydration: Secondary | ICD-10-CM

## 2014-11-22 DIAGNOSIS — E871 Hypo-osmolality and hyponatremia: Secondary | ICD-10-CM

## 2014-11-22 DIAGNOSIS — R739 Hyperglycemia, unspecified: Secondary | ICD-10-CM

## 2014-11-22 LAB — COMPREHENSIVE METABOLIC PANEL
ALT: 23 U/L (ref 0–53)
AST: 45 U/L — ABNORMAL HIGH (ref 0–37)
Albumin: 4 g/dL (ref 3.5–5.2)
Alkaline Phosphatase: 114 U/L (ref 39–117)
Anion gap: 9 (ref 5–15)
BILIRUBIN TOTAL: 0.7 mg/dL (ref 0.3–1.2)
BUN: 9 mg/dL (ref 6–23)
CO2: 26 mmol/L (ref 19–32)
Calcium: 9.2 mg/dL (ref 8.4–10.5)
Chloride: 91 mmol/L — ABNORMAL LOW (ref 96–112)
Creatinine, Ser: 0.84 mg/dL (ref 0.50–1.35)
Glucose, Bld: 323 mg/dL — ABNORMAL HIGH (ref 70–99)
POTASSIUM: 2.9 mmol/L — AB (ref 3.5–5.1)
Sodium: 126 mmol/L — ABNORMAL LOW (ref 135–145)
TOTAL PROTEIN: 7.6 g/dL (ref 6.0–8.3)

## 2014-11-22 LAB — URINE MICROSCOPIC-ADD ON

## 2014-11-22 LAB — CBC WITH DIFFERENTIAL/PLATELET
BASOS ABS: 0.1 10*3/uL (ref 0.0–0.1)
Basophils Relative: 1 % (ref 0–1)
EOS ABS: 0.4 10*3/uL (ref 0.0–0.7)
EOS PCT: 4 % (ref 0–5)
HCT: 42.5 % (ref 39.0–52.0)
Hemoglobin: 14.9 g/dL (ref 13.0–17.0)
Lymphocytes Relative: 21 % (ref 12–46)
Lymphs Abs: 2.1 10*3/uL (ref 0.7–4.0)
MCH: 27 pg (ref 26.0–34.0)
MCHC: 35.1 g/dL (ref 30.0–36.0)
MCV: 77.1 fL — AB (ref 78.0–100.0)
MONOS PCT: 6 % (ref 3–12)
Monocytes Absolute: 0.6 10*3/uL (ref 0.1–1.0)
Neutro Abs: 6.6 10*3/uL (ref 1.7–7.7)
Neutrophils Relative %: 68 % (ref 43–77)
PLATELETS: ADEQUATE 10*3/uL (ref 150–400)
RBC: 5.51 MIL/uL (ref 4.22–5.81)
RDW: 12.3 % (ref 11.5–15.5)
WBC: 9.8 10*3/uL (ref 4.0–10.5)

## 2014-11-22 LAB — URINALYSIS, ROUTINE W REFLEX MICROSCOPIC
Bilirubin Urine: NEGATIVE
Glucose, UA: >1000 mg/dL — AB
Hgb urine dipstick: NEGATIVE
Ketones, ur: NEGATIVE mg/dL
NITRITE: NEGATIVE
PROTEIN: NEGATIVE mg/dL
Specific Gravity, Urine: 1.007 (ref 1.005–1.030)
Urobilinogen, UA: 0.2 mg/dL (ref 0.0–1.0)
pH: 6 (ref 5.0–8.0)

## 2014-11-22 LAB — CBG MONITORING, ED
GLUCOSE-CAPILLARY: 388 mg/dL — AB (ref 70–99)
Glucose-Capillary: 243 mg/dL — ABNORMAL HIGH (ref 70–99)

## 2014-11-22 MED ORDER — INSULIN ASPART PROT & ASPART (70-30 MIX) 100 UNIT/ML ~~LOC~~ SUSP: 20.0000 [IU] | Freq: Two times a day (BID) | SUBCUTANEOUS | Status: DC

## 2014-11-22 MED ORDER — INSULIN ASPART 100 UNIT/ML IV SOLN
10.0000 [IU] | Freq: Once | INTRAVENOUS | Status: AC
  Administered 2014-11-22: 10 [IU] via INTRAVENOUS
  Filled 2014-11-22: qty 0.1

## 2014-11-22 MED ORDER — POTASSIUM CHLORIDE CRYS ER 20 MEQ PO TBCR
40.0000 meq | EXTENDED_RELEASE_TABLET | Freq: Once | ORAL | Status: AC
  Administered 2014-11-22: 40 meq via ORAL
  Filled 2014-11-22: qty 2

## 2014-11-22 MED ORDER — SODIUM CHLORIDE 0.9 % IV BOLUS (SEPSIS)
2000.0000 mL | Freq: Once | INTRAVENOUS | Status: AC
  Administered 2014-11-22: 2000 mL via INTRAVENOUS

## 2014-11-22 MED ORDER — ONDANSETRON HCL 4 MG/2ML IJ SOLN
4.0000 mg | Freq: Once | INTRAMUSCULAR | Status: AC
  Administered 2014-11-22: 4 mg via INTRAVENOUS
  Filled 2014-11-22: qty 2

## 2014-11-22 MED ORDER — ONDANSETRON 4 MG PO TBDP: ORAL_TABLET | ORAL | Status: DC

## 2014-11-22 MED ORDER — BLOOD GLUCOSE MONITOR KIT: PACK | Status: DC

## 2014-11-22 NOTE — Discharge Instructions (Signed)
Dehydration, Adult Dehydration is when you lose more fluids from the body than you take in. Vital organs like the kidneys, brain, and heart cannot function without a proper amount of fluids and salt. Any loss of fluids from the body can cause dehydration.  CAUSES   Vomiting.  Diarrhea.  Excessive sweating.  Excessive urine output.  Fever. SYMPTOMS  Mild dehydration  Thirst.  Dry lips.  Slightly dry mouth. Moderate dehydration  Very dry mouth.  Sunken eyes.  Skin does not bounce back quickly when lightly pinched and released.  Dark urine and decreased urine production.  Decreased tear production.  Headache. Severe dehydration  Very dry mouth.  Extreme thirst.  Rapid, weak pulse (more than 100 beats per minute at rest).  Cold hands and feet.  Not able to sweat in spite of heat and temperature.  Rapid breathing.  Blue lips.  Confusion and lethargy.  Difficulty being awakened.  Minimal urine production.  No tears. DIAGNOSIS  Your caregiver will diagnose dehydration based on your symptoms and your exam. Blood and urine tests will help confirm the diagnosis. The diagnostic evaluation should also identify the cause of dehydration. TREATMENT  Treatment of mild or moderate dehydration can often be done at home by increasing the amount of fluids that you drink. It is best to drink small amounts of fluid more often. Drinking too much at one time can make vomiting worse. Refer to the home care instructions below. Severe dehydration needs to be treated at the hospital where you will probably be given intravenous (IV) fluids that contain water and electrolytes. HOME CARE INSTRUCTIONS   Ask your caregiver about specific rehydration instructions.  Drink enough fluids to keep your urine clear or pale yellow.  Drink small amounts frequently if you have nausea and vomiting.  Eat as you normally do.  Avoid:  Foods or drinks high in sugar.  Carbonated  drinks.  Juice.  Extremely hot or cold fluids.  Drinks with caffeine.  Fatty, greasy foods.  Alcohol.  Tobacco.  Overeating.  Gelatin desserts.  Wash your hands well to avoid spreading bacteria and viruses.  Only take over-the-counter or prescription medicines for pain, discomfort, or fever as directed by your caregiver.  Ask your caregiver if you should continue all prescribed and over-the-counter medicines.  Keep all follow-up appointments with your caregiver. SEEK MEDICAL CARE IF:  You have abdominal pain and it increases or stays in one area (localizes).  You have a rash, stiff neck, or severe headache.  You are irritable, sleepy, or difficult to awaken.  You are weak, dizzy, or extremely thirsty. SEEK IMMEDIATE MEDICAL CARE IF:   You are unable to keep fluids down or you get worse despite treatment.  You have frequent episodes of vomiting or diarrhea.  You have blood or green matter (bile) in your vomit.  You have blood in your stool or your stool looks black and tarry.  You have not urinated in 6 to 8 hours, or you have only urinated a small amount of very dark urine.  You have a fever.  You faint. MAKE SURE YOU:   Understand these instructions.  Will watch your condition.  Will get help right away if you are not doing well or get worse. Document Released: 09/08/2005 Document Revised: 12/01/2011 Document Reviewed: 04/28/2011 [facility] Patient Information 2015 [place], [place]. This information is not intended to replace advice given to you by your health care provider. Make sure you discuss any questions you have with your health care  provider.  Hyperglycemia Hyperglycemia occurs when the glucose (sugar) in your blood is too high. Hyperglycemia can happen for many reasons, but it most often happens to people who do not know they have diabetes or are not managing their diabetes properly.  CAUSES  Whether you have diabetes or not, there are other causes  of hyperglycemia. Hyperglycemia can occur when you have diabetes, but it can also occur in other situations that you might not be as aware of, such as: Diabetes  If you have diabetes and are having problems controlling your blood glucose, hyperglycemia could occur because of some of the following reasons:  Not following your meal plan.  Not taking your diabetes medications or not taking it properly.  Exercising less or doing less activity than you normally do.  Being sick. Pre-diabetes  This cannot be ignored. Before people develop Type 2 diabetes, they almost always have "pre-diabetes." This is when your blood glucose levels are higher than normal, but not yet high enough to be diagnosed as diabetes. Research has shown that some long-term damage to the body, especially the heart and circulatory system, may already be occurring during pre-diabetes. If you take action to manage your blood glucose when you have pre-diabetes, you may delay or prevent Type 2 diabetes from developing. Stress  If you have diabetes, you may be "diet" controlled or on oral medications or insulin to control your diabetes. However, you may find that your blood glucose is higher than usual in the hospital whether you have diabetes or not. This is often referred to as "stress hyperglycemia." Stress can elevate your blood glucose. This happens because of hormones put out by the body during times of stress. If stress has been the cause of your high blood glucose, it can be followed regularly by your caregiver. That way he/she can make sure your hyperglycemia does not continue to get worse or progress to diabetes. Steroids  Steroids are medications that act on the infection fighting system (immune system) to block inflammation or infection. One side effect can be a rise in blood glucose. Most people can produce enough extra insulin to allow for this rise, but for those who cannot, steroids make blood glucose levels go even  higher. It is not unusual for steroid treatments to "uncover" diabetes that is developing. It is not always possible to determine if the hyperglycemia will go away after the steroids are stopped. A special blood test called an A1c is sometimes done to determine if your blood glucose was elevated before the steroids were started. SYMPTOMS  Thirsty.  Frequent urination.  Dry mouth.  Blurred vision.  Tired or fatigue.  Weakness.  Sleepy.  Tingling in feet or leg. DIAGNOSIS  Diagnosis is made by monitoring blood glucose in one or all of the following ways:  A1c test. This is a chemical found in your blood.  Fingerstick blood glucose monitoring.  Laboratory results. TREATMENT  First, knowing the cause of the hyperglycemia is important before the hyperglycemia can be treated. Treatment may include, but is not be limited to:  Education.  Change or adjustment in medications.  Change or adjustment in meal plan.  Treatment for an illness, infection, etc.  More frequent blood glucose monitoring.  Change in exercise plan.  Decreasing or stopping steroids.  Lifestyle changes. HOME CARE INSTRUCTIONS   Test your blood glucose as directed.  Exercise regularly. Your caregiver will give you instructions about exercise. Pre-diabetes or diabetes which comes on with stress is helped by exercising.  Eat wholesome, balanced meals. Eat often and at regular, fixed times. Your caregiver or nutritionist will give you a meal plan to guide your sugar intake.  Being at an ideal weight is important. If needed, losing as little as 10 to 15 pounds may help improve blood glucose levels. SEEK MEDICAL CARE IF:   You have questions about medicine, activity, or diet.  You continue to have symptoms (problems such as increased thirst, urination, or weight gain). SEEK IMMEDIATE MEDICAL CARE IF:   You are vomiting or have diarrhea.  Your breath smells fruity.  You are breathing faster or  slower.  You are very sleepy or incoherent.  You have numbness, tingling, or pain in your feet or hands.  You have chest pain.  Your symptoms get worse even though you have been following your caregiver's orders.  If you have any other questions or concerns. Document Released: 03/04/2001 Document Revised: 12/01/2011 Document Reviewed: 01/05/2012 [facility] Patient Information 2015 [place], [place]. This information is not intended to replace advice given to you by your health care provider. Make sure you discuss any questions you have with your health care provider.  Hypokalemia Hypokalemia means that the amount of potassium in the blood is lower than normal.Potassium is a chemical, called an electrolyte, that helps regulate the amount of fluid in the body. It also stimulates muscle contraction and helps nerves function properly.Most of the body's potassium is inside of cells, and only a very small amount is in the blood. Because the amount in the blood is so small, minor changes can be life-threatening. CAUSES  Antibiotics.  Diarrhea or vomiting.  Using laxatives too much, which can cause diarrhea.  Chronic kidney disease.  Water pills (diuretics).  Eating disorders (bulimia).  Low magnesium level.  Sweating a lot. SIGNS AND SYMPTOMS  Weakness.  Constipation.  Fatigue.  Muscle cramps.  Mental confusion.  Skipped heartbeats or irregular heartbeat (palpitations).  Tingling or numbness. DIAGNOSIS  Your health care provider can diagnose hypokalemia with blood tests. In addition to checking your potassium level, your health care provider may also check other lab tests. TREATMENT Hypokalemia can be treated with potassium supplements taken by mouth or adjustments in your current medicines. If your potassium level is very low, you may need to get potassium through a vein (IV) and be monitored in the hospital. A diet high in potassium is also helpful. Foods high in potassium  are:  Nuts, such as peanuts and pistachios.  Seeds, such as sunflower seeds and pumpkin seeds.  Peas, lentils, and lima beans.  Whole grain and bran cereals and breads.  Fresh fruit and vegetables, such as apricots, avocado, bananas, cantaloupe, kiwi, oranges, tomatoes, asparagus, and potatoes.  Orange and tomato juices.  Red meats.  Fruit yogurt. HOME CARE INSTRUCTIONS  Take all medicines as prescribed by your health care provider.  Maintain a healthy diet by including nutritious food, such as fruits, vegetables, nuts, whole grains, and lean meats.  If you are taking a laxative, be sure to follow the directions on the label. SEEK MEDICAL CARE IF:  Your weakness gets worse.  You feel your heart pounding or racing.  You are vomiting or having diarrhea.  You are diabetic and having trouble keeping your blood glucose in the normal range. SEEK IMMEDIATE MEDICAL CARE IF:  You have chest pain, shortness of breath, or dizziness.  You are vomiting or having diarrhea for more than 2 days.  You faint. MAKE SURE YOU:   Understand these instructions.  Will watch your condition.  Will get help right away if you are not doing well or get worse. Document Released: 09/08/2005 Document Revised: 06/29/2013 Document Reviewed: 03/11/2013 [facility] Patient Information 2015 [place], [place]. This information is not intended to replace advice given to you by your health care provider. Make sure you discuss any questions you have with your health care provider.  Hyponatremia  Hyponatremia is when the amount of salt (sodium) in your blood is too low. When sodium levels are low, your cells will absorb extra water and swell. The swelling happens throughout the body, but it mostly affects the brain. Severe brain swelling (cerebral edema), seizures, or coma can happen.  CAUSES   Heart, kidney, or liver problems.  Thyroid problems.  Adrenal gland problems.  Severe vomiting and  diarrhea.  Certain medicines or illegal drugs.  Dehydration.  Drinking too much water.  Low-sodium diet. SYMPTOMS   Nausea and vomiting.  Confusion.  Lethargy.  Agitation.  Headache.  Twitching or shaking (seizures).  Unconsciousness.  Appetite loss.  Muscle weakness and cramping. DIAGNOSIS  Hyponatremia is identified by a simple blood test. Your caregiver will perform a history and physical exam to try to find the cause and type of hyponatremia. Other tests may be needed to measure the amount of sodium in your blood and urine. TREATMENT  Treatment will depend on the cause.   Fluids may be given through the vein (IV).  Medicines may be used to correct the sodium imbalance. If medicines are causing the problem, they will need to be adjusted.  Water or fluid intake may be restricted to restore proper balance. The speed of correcting the sodium problem is very important. If the problem is corrected too fast, nerve damage (sometimes unchangeable) can happen. HOME CARE INSTRUCTIONS   Only take medicines as directed by your caregiver. Many medicines can make hyponatremia worse. Discuss all your medicines with your caregiver.  Carefully follow any recommended diet, including any fluid restrictions.  You may be asked to repeat lab tests. Follow these directions.  Avoid alcohol and recreational drugs. SEEK MEDICAL CARE IF:   You develop worsening nausea, fatigue, headache, confusion, or weakness.  Your original hyponatremia symptoms return.  You have problems following the recommended diet. SEEK IMMEDIATE MEDICAL CARE IF:   You have a seizure.  You faint.  You have ongoing diarrhea or vomiting. MAKE SURE YOU:   Understand these instructions.  Will watch your condition.  Will get help right away if you are not doing well or get worse. Document Released: 08/29/2002 Document Revised: 12/01/2011 Document Reviewed: 02/23/2011 [facility] Patient Information 2015  [place], [place]. This information is not intended to replace advice given to you by your health care provider. Make sure you discuss any questions you have with your health care provider.

## 2014-11-22 NOTE — ED Notes (Signed)
Pt is aware of the need for urine, however states he urinated while waiting is unable to provide one at this time.

## 2014-11-22 NOTE — ED Notes (Signed)
Provided patient a cup of ice water.  

## 2014-11-22 NOTE — ED Provider Notes (Signed)
CSN: [id number]     Arrival date & time 11/21/14  2002 History   First MD Initiated Contact with Patient 11/22/14 [phone]     Chief Complaint  Patient presents with  . Emesis  . Nausea     (Consider location/radiation/quality/duration/timing/severity/associated sxs/prior Treatment) HPI Patient presents with nausea and multiple episodes of vomiting throughout the day. He also complains of generalized fatigue, leg cramps and frontal headache. He's had no fever or chills. Denies any abdominal pain. States he's been noncompliant with his diabetic medication. No known sick contacts. Headache is gradual onset throbbing in nature. No photophobia, neck pain or stiffness, focal weakness or numbness. Past Medical History  Diagnosis Date  . Diabetes mellitus without complication    Past Surgical History  Procedure Laterality Date  . No past surgeries     Family History  Problem Relation Age of Onset  . Diabetes Maternal Aunt   . Diabetes Maternal Grandmother   . Diabetes Maternal Grandfather   . Thyroid disease    . Thyroid disease Mother    History  Substance Use Topics  . Smoking status: Current Every Day Smoker -- 0.25 packs/day for 6 years    Types: Cigarettes  . Smokeless tobacco: Never Used  . Alcohol Use: No    Review of Systems  Constitutional: Positive for fatigue. Negative for fever and chills.  Eyes: Negative for photophobia and visual disturbance.  Respiratory: Negative for cough and shortness of breath.   Cardiovascular: Negative for chest pain.  Gastrointestinal: Positive for nausea and vomiting. Negative for abdominal pain, diarrhea and constipation.  Genitourinary: Positive for frequency. Negative for dysuria.  Musculoskeletal: Positive for myalgias. Negative for back pain, neck pain and neck stiffness.  Skin: Negative for rash and wound.  Neurological: Positive for headaches. Negative for dizziness, weakness, light-headedness and numbness.  All other systems reviewed  and are negative.     Allergies  Review of patient's allergies indicates no known allergies.  Home Medications   Prior to Admission medications   Medication Sig Start Date End Date Taking? Authorizing Provider  insulin aspart protamine- aspart (NOVOLOG MIX 70/30) (70-30) 100 UNIT/ML injection Inject 0.2 mLs (20 Units total) into the skin 2 (two) times daily with a meal. 10/02/14  Yes Deepak Advani, MD  Blood Glucose Monitoring Suppl (BLOOD GLUCOSE METER KIT AND SUPPLIES) KIT Dispense based on patient and insurance preference. Use up to four times daily as directed.  Provide True Result brand. 08/09/14   Theodis Blaze, MD   BP 98/52 mmHg  Pulse 74  Temp(Src) 97.5 F (36.4 C) (Oral)  Resp 16  Ht 6' (1.829 m)  Wt 240 lb (108.863 kg)  BMI 32.54 kg/m2  SpO2 100% Physical Exam  Constitutional: He is oriented to person, place, and time. He appears well-developed and well-nourished. No distress.  HENT:  Head: Normocephalic and atraumatic.  Mouth/Throat: Oropharynx is clear and moist. No oropharyngeal exudate.  Eyes: EOM are normal. Pupils are equal, round, and reactive to light.  Neck: Normal range of motion. Neck supple.  No meningismus  Cardiovascular: Normal rate and regular rhythm.  Exam reveals no gallop and no friction rub.   No murmur heard. Pulmonary/Chest: Effort normal and breath sounds normal. No respiratory distress. He has no wheezes. He has no rales. He exhibits no tenderness.  Abdominal: Soft. Bowel sounds are normal. He exhibits no distension and no mass. There is no tenderness. There is no rebound and no guarding.  Musculoskeletal: Normal range of motion. He exhibits  no edema or tenderness.  Neurological: He is alert and oriented to person, place, and time.  Patient is alert and oriented x3 with clear, goal oriented speech. Patient has 5/5 motor in all extremities. Sensation is intact to light touch. Bilateral finger-to-nose is normal with no signs of dysmetria. Patient  has a normal gait and walks without assistance.  Skin: Skin is warm and dry. No rash noted. No erythema.  Psychiatric: He has a normal mood and affect. His behavior is normal.  Nursing note and vitals reviewed.   ED Course  Procedures (including critical care time) Labs Review Labs Reviewed  CBC WITH DIFFERENTIAL/PLATELET - Abnormal; Notable for the following:    MCV 77.1 (*)    All other components within normal limits  COMPREHENSIVE METABOLIC PANEL - Abnormal; Notable for the following:    Sodium 126 (*)    Potassium 2.9 (*)    Chloride 91 (*)    Glucose, Bld 323 (*)    AST 45 (*)    All other components within normal limits  URINALYSIS, ROUTINE W REFLEX MICROSCOPIC - Abnormal; Notable for the following:    Glucose, UA >1000 (*)    Leukocytes, UA SMALL (*)    All other components within normal limits  URINE MICROSCOPIC-ADD ON - Abnormal; Notable for the following:    Bacteria, UA FEW (*)    All other components within normal limits  CBG MONITORING, ED - Abnormal; Notable for the following:    Glucose-Capillary 245 (*)    All other components within normal limits  CBG MONITORING, ED - Abnormal; Notable for the following:    Glucose-Capillary 388 (*)    All other components within normal limits  CBG MONITORING, ED - Abnormal; Notable for the following:    Glucose-Capillary 243 (*)    All other components within normal limits    Imaging Review No results found.   EKG Interpretation None      MDM   Final diagnoses:  Hyperglycemia  Dehydration  Hypokalemia    Patient with medical noncompliance nausea, vomiting, headache, fatigue and hyperglycemia. Given insulin IV fluids as well as antiemetic. No further vomiting in the emergency department. Patient continues to be well-appearing. Patient with mild hyponatremia and hypokalemia. Given oral potassium replacement.  Blood sugars improved. Patient is drinking without any further vomiting. We'll refill medication. He is  advised to follow-up with his primary care through [facility]. Return precautions given.  Julianne Rice, MD 11/22/14 [phone]

## 2014-11-22 NOTE — ED Notes (Signed)
Pt lying on his right side with eyes closed. Appears in no distress. Respirations are even, regular, and unlabored.

## 2015-02-08 ENCOUNTER — Ambulatory Visit: Payer: Self-pay | Admitting: Internal Medicine

## 2015-02-23 ENCOUNTER — Ambulatory Visit: Payer: Self-pay | Attending: Internal Medicine | Admitting: Internal Medicine

## 2015-02-23 ENCOUNTER — Encounter: Payer: Self-pay | Admitting: Internal Medicine

## 2015-02-23 VITALS — BP 131/79 | HR 83 | Temp 98.0°F | Resp 16

## 2015-02-23 DIAGNOSIS — Z9111 Patient's noncompliance with dietary regimen: Secondary | ICD-10-CM | POA: Insufficient documentation

## 2015-02-23 DIAGNOSIS — Z794 Long term (current) use of insulin: Secondary | ICD-10-CM | POA: Insufficient documentation

## 2015-02-23 DIAGNOSIS — E139 Other specified diabetes mellitus without complications: Secondary | ICD-10-CM

## 2015-02-23 DIAGNOSIS — E1165 Type 2 diabetes mellitus with hyperglycemia: Secondary | ICD-10-CM | POA: Insufficient documentation

## 2015-02-23 LAB — POCT URINALYSIS DIPSTICK
BILIRUBIN UA: NEGATIVE
Blood, UA: NEGATIVE
Glucose, UA: 500
KETONES UA: NEGATIVE
Nitrite, UA: NEGATIVE
Protein, UA: NEGATIVE
Spec Grav, UA: <=1.005
UROBILINOGEN UA: 0.2
pH, UA: 6

## 2015-02-23 LAB — GLUCOSE, POCT (MANUAL RESULT ENTRY)
POC GLUCOSE: 528 mg/dL — AB (ref 70–99)
POC Glucose: 531 mg/dl — AB (ref 70–99)

## 2015-02-23 LAB — POCT GLYCOSYLATED HEMOGLOBIN (HGB A1C): Hemoglobin A1C: >15

## 2015-02-23 MED ORDER — INSULIN ASPART PROT & ASPART (70-30 MIX) 100 UNIT/ML ~~LOC~~ SUSP: 25.0000 [IU] | Freq: Two times a day (BID) | SUBCUTANEOUS | Status: DC

## 2015-02-23 MED ORDER — INSULIN ASPART 100 UNIT/ML ~~LOC~~ SOLN
20.0000 [IU] | Freq: Once | SUBCUTANEOUS | Status: AC
  Administered 2015-02-23: 20 [IU] via SUBCUTANEOUS

## 2015-02-23 NOTE — Patient Instructions (Signed)
Diabetes Mellitus and Food It is important for you to manage your blood sugar (glucose) level. Your blood glucose level can be greatly affected by what you eat. Eating healthier foods in the appropriate amounts throughout the day at about the same time each day will help you control your blood glucose level. It can also help slow or prevent worsening of your diabetes mellitus. Healthy eating may even help you improve the level of your blood pressure and reach or maintain a healthy weight.  HOW CAN FOOD AFFECT ME? Carbohydrates Carbohydrates affect your blood glucose level more than any other type of food. Your dietitian will help you determine how many carbohydrates to eat at each meal and teach you how to count carbohydrates. Counting carbohydrates is important to keep your blood glucose at a healthy level, especially if you are using insulin or taking certain medicines for diabetes mellitus. Alcohol Alcohol can cause sudden decreases in blood glucose (hypoglycemia), especially if you use insulin or take certain medicines for diabetes mellitus. Hypoglycemia can be a life-threatening condition. Symptoms of hypoglycemia (sleepiness, dizziness, and disorientation) are similar to symptoms of having too much alcohol.  If your health care provider has given you approval to drink alcohol, do so in moderation and use the following guidelines:  Women should not have more than one drink per day, and men should not have more than two drinks per day. One drink is equal to:  12 oz of beer.  5 oz of wine.  1 oz of hard liquor.  Do not drink on an empty stomach.  Keep yourself hydrated. Have water, diet soda, or unsweetened iced tea.  Regular soda, juice, and other mixers might contain a lot of carbohydrates and should be counted. WHAT FOODS ARE NOT RECOMMENDED? As you make food choices, it is important to remember that all foods are not the same. Some foods have fewer nutrients per serving than other  foods, even though they might have the same number of calories or carbohydrates. It is difficult to get your body what it needs when you eat foods with fewer nutrients. Examples of foods that you should avoid that are high in calories and carbohydrates but low in nutrients include:  Trans fats (most processed foods list trans fats on the Nutrition Facts label).  Regular soda.  Juice.  Candy.  Sweets, such as cake, pie, doughnuts, and cookies.  Fried foods. WHAT FOODS CAN I EAT? Have nutrient-rich foods, which will nourish your body and keep you healthy. The food you should eat also will depend on several factors, including:  The calories you need.  The medicines you take.  Your weight.  Your blood glucose level.  Your blood pressure level.  Your cholesterol level. You also should eat a variety of foods, including:  Protein, such as meat, poultry, fish, tofu, nuts, and seeds (lean animal proteins are best).  Fruits.  Vegetables.  Dairy products, such as milk, cheese, and yogurt (low fat is best).  Breads, grains, pasta, cereal, rice, and beans.  Fats such as olive oil, trans fat-free margarine, canola oil, avocado, and olives. DOES EVERYONE WITH DIABETES MELLITUS HAVE THE SAME MEAL PLAN? Because every person with diabetes mellitus is different, there is not one meal plan that works for everyone. It is very important that you meet with a dietitian who will help you create a meal plan that is just right for you. Document Released: 06/05/2005 Document Revised: 09/13/2013 Document Reviewed: 08/05/2013 Exit[employer] Patient Information 2015 [employer]. This   information is not intended to replace advice given to you by your health care provider. Make sure you discuss any questions you have with your health care provider.  

## 2015-02-23 NOTE — Progress Notes (Signed)
Patient here for follow up on his diabetes Presents in office with elevated blood sugar Patient states he did not take his medication today or last night

## 2015-02-23 NOTE — Progress Notes (Signed)
MRN: [id number] Name: Jonathan Porter  Sex: male Age: 24 y.o. DOB: [birth year]  Allergies: Review of patient's allergies indicates no known allergies.  Chief Complaint  Patient presents with  . Follow-up    HPI: Patient is 24 y.o. male who has history of type 1 diabetes, currently on insulin 70/30, 20 units 2 times a day as per patient since yesterday he has not taken his insulin and he has already eaten today in the morning before he came, his blood sugar today is elevated denies any symptoms of headache dizziness chest and shortness of breath, blurry vision, nausea vomiting or any urinary symptoms, patient is advised for  IV fluids but he is reluctant , his hemoglobin A1c has trended up, patient has not been checking his blood sugar level at-home regularly. Patient has not been compliant with his low carbohydrate diet.  Past Medical History  Diagnosis Date  . Diabetes mellitus without complication     Past Surgical History  Procedure Laterality Date  . No past surgeries        Medication List       This list is accurate as of: 02/23/15 10:03 AM.  Always use your most recent med list.               blood glucose meter kit and supplies Kit  - Dispense based on patient and insurance preference. Use up to four times daily as directed.   - Provide True Result brand.     insulin aspart protamine- aspart (70-30) 100 UNIT/ML injection  Commonly known as:  NOVOLOG MIX 70/30  Inject 0.25 mLs (25 Units total) into the skin 2 (two) times daily with a meal.     ondansetron 4 MG disintegrating tablet  Commonly known as:  ZOFRAN ODT  29m ODT q4 hours prn nausea/vomit        Meds ordered this encounter  Medications  . insulin aspart (novoLOG) injection 20 Units    Sig:   . insulin aspart protamine- aspart (NOVOLOG MIX 70/30) (70-30) 100 UNIT/ML injection    Sig: Inject 0.25 mLs (25 Units total) into the skin 2 (two) times daily with a meal.    Dispense:  30 mL    Refill:   3     There is no immunization history on file for this patient.  Family History  Problem Relation Age of Onset  . Diabetes Maternal Aunt   . Diabetes Maternal Grandmother   . Diabetes Maternal Grandfather   . Thyroid disease    . Thyroid disease Mother     History  Substance Use Topics  . Smoking status: Current Every Day Smoker -- 0.25 packs/day for 6 years    Types: Cigarettes  . Smokeless tobacco: Never Used  . Alcohol Use: No    Review of Systems   As noted in HPI  Filed Vitals:   02/23/15 0921  BP: 131/79  Pulse: 83  Temp: 98 F (36.7 C)  Resp: 16    Physical Exam  Physical Exam  Constitutional: No distress.  Eyes: EOM are normal. Pupils are equal, round, and reactive to light.  Cardiovascular: Normal rate and regular rhythm.   Pulmonary/Chest: Breath sounds normal. No respiratory distress. He has no wheezes. He has no rales.  Abdominal: Soft. He exhibits no distension. There is no tenderness. There is no rebound.  Musculoskeletal: He exhibits no edema.    CBC    Component Value Date/Time   WBC 9.8 11/22/2014 0125  RBC 5.51 11/22/2014 0125   HGB 14.9 11/22/2014 0125   HCT 42.5 11/22/2014 0125   PLT  11/22/2014 0125    PLATELET CLUMPS NOTED ON SMEAR, COUNT APPEARS ADEQUATE   MCV 77.1* 11/22/2014 0125   LYMPHSABS 2.1 11/22/2014 0125   MONOABS 0.6 11/22/2014 0125   EOSABS 0.4 11/22/2014 0125   BASOSABS 0.1 11/22/2014 0125    CMP     Component Value Date/Time   NA 126* 11/22/2014 0125   K 2.9* 11/22/2014 0125   CL 91* 11/22/2014 0125   CO2 26 11/22/2014 0125   GLUCOSE 323* 11/22/2014 0125   BUN 9 11/22/2014 0125   CREATININE 0.84 11/22/2014 0125   CALCIUM 9.2 11/22/2014 0125   PROT 7.6 11/22/2014 0125   ALBUMIN 4.0 11/22/2014 0125   AST 45* 11/22/2014 0125   ALT 23 11/22/2014 0125   ALKPHOS 114 11/22/2014 0125   BILITOT 0.7 11/22/2014 0125   GFRNONAA >90 11/22/2014 0125   GFRAA >90 11/22/2014 0125    Lab Results  Component Value  Date/Time   CHOL * 08/31/2007 04:40 AM    339        ATP III CLASSIFICATION:  <200     mg/dL   Desirable  200-239  mg/dL   Borderline High  >=240    mg/dL   High    Lab Results  Component Value Date/Time   HGBA1C >15.0 02/23/2015 09:22 AM   HGBA1C 13.3* 08/07/2014 07:55 PM    Lab Results  Component Value Date/Time   AST 45* 11/22/2014 01:25 AM    Assessment and Plan  Other specified diabetes mellitus without complications - Plan:  Results for orders placed or performed in visit on 02/23/15  Glucose (CBG)  Result Value Ref Range   POC Glucose 531.0 (A) 70 - 99 mg/dl  HgB A1c  Result Value Ref Range   Hemoglobin A1C >15.0   Urinalysis Dipstick  Result Value Ref Range   Color, UA yellow    Clarity, UA clear    Glucose, UA 500    Bilirubin, UA neg    Ketones, UA neg    Spec Grav, UA <=1.005    Blood, UA neg    pH, UA 6.0    Protein, UA neg    Urobilinogen, UA 0.2    Nitrite, UA neg    Leukocytes, UA Trace   Glucose (CBG)  Result Value Ref Range   POC Glucose 528 (A) 70 - 99 mg/dl   Glucose (CBG), HgB A1c, Urinalysis Dipstick, insulin aspart (novoLOG) injection 20 Units, insulin aspart protamine- aspart (NOVOLOG MIX 70/30) (70-30) 100 UNIT/ML injection, COMPLETE METABOLIC PANEL WITH GFR, Lipid panel   Patient has hyperglycemia, is asymptomatic, urine is negative for ketones, patient declines IV fluids, I have advised patient for diabetes meal planning, advise patient to keep the fingerstick log, I have also increased his insulin to 25 units 2 times a day, she will come back in 2 weeks for nurse visit and bring the log as well as was do fasting blood work .  Return in about 3 months (around 05/26/2015) for diabetes, CBG check and fasting lab work  in 2 weeks/Nurse Visit.   This note has been created with Surveyor, quantity. Any transcriptional errors are unintentional.    Lorayne Marek, MD

## 2015-07-31 ENCOUNTER — Telehealth: Payer: Self-pay | Admitting: Internal Medicine

## 2015-07-31 DIAGNOSIS — E139 Other specified diabetes mellitus without complications: Secondary | ICD-10-CM

## 2015-07-31 NOTE — Telephone Encounter (Signed)
Patient godmother calling and states that the pt desperately needs a refill on insulin aspart protamine- aspart (NOVOLOG MIX 70/30). Patient godmother expresses fear that pt will be hospitalized soon. Patient has an upcoming appt with Dr. Armen PickupFunches. Pt godmother calling because pt's phone is not in service. Please follow up asap. Sadie Reynolds, ASA

## 2015-08-23 ENCOUNTER — Encounter: Payer: Self-pay | Admitting: Family Medicine

## 2015-08-23 ENCOUNTER — Ambulatory Visit: Payer: Self-pay | Attending: Family Medicine | Admitting: Family Medicine

## 2015-08-23 VITALS — BP 121/81 | HR 95 | Temp 98.5°F | Resp 18 | Ht 71.0 in | Wt 229.0 lb

## 2015-08-23 DIAGNOSIS — E1065 Type 1 diabetes mellitus with hyperglycemia: Secondary | ICD-10-CM | POA: Insufficient documentation

## 2015-08-23 DIAGNOSIS — N471 Phimosis: Secondary | ICD-10-CM | POA: Insufficient documentation

## 2015-08-23 DIAGNOSIS — F17200 Nicotine dependence, unspecified, uncomplicated: Secondary | ICD-10-CM | POA: Insufficient documentation

## 2015-08-23 DIAGNOSIS — B351 Tinea unguium: Secondary | ICD-10-CM | POA: Insufficient documentation

## 2015-08-23 DIAGNOSIS — IMO0002 Reserved for concepts with insufficient information to code with codable children: Secondary | ICD-10-CM | POA: Insufficient documentation

## 2015-08-23 DIAGNOSIS — Z794 Long term (current) use of insulin: Secondary | ICD-10-CM | POA: Insufficient documentation

## 2015-08-23 LAB — POCT URINALYSIS DIPSTICK
BILIRUBIN UA: NEGATIVE
GLUCOSE UA: 500
Ketones, UA: NEGATIVE
Nitrite, UA: NEGATIVE
Protein, UA: NEGATIVE
Urobilinogen, UA: 0.2
pH, UA: 5.5

## 2015-08-23 LAB — POCT GLYCOSYLATED HEMOGLOBIN (HGB A1C)

## 2015-08-23 LAB — GLUCOSE, POCT (MANUAL RESULT ENTRY)

## 2015-08-23 MED ORDER — TERBINAFINE HCL 250 MG PO TABS: 250.0000 mg | ORAL_TABLET | Freq: Every day | ORAL | Status: DC

## 2015-08-23 MED ORDER — INSULIN ASPART 100 UNIT/ML ~~LOC~~ SOLN
20.0000 [IU] | Freq: Once | SUBCUTANEOUS | Status: AC
  Administered 2015-08-23: 20 [IU] via SUBCUTANEOUS

## 2015-08-23 MED ORDER — INSULIN ASPART PROT & ASPART (70-30 MIX) 100 UNIT/ML ~~LOC~~ SUSP: 25.0000 [IU] | Freq: Two times a day (BID) | SUBCUTANEOUS | Status: DC

## 2015-08-23 MED ORDER — TRUEPLUS LANCETS 28G MISC: 1.0000 | Freq: Three times a day (TID) | Status: DC

## 2015-08-23 MED ORDER — KETOCONAZOLE 2 % EX CREA: 1.0000 "application " | TOPICAL_CREAM | Freq: Two times a day (BID) | CUTANEOUS | Status: DC | PRN

## 2015-08-23 MED ORDER — TRUE METRIX METER W/DEVICE KIT: 1.0000 | PACK | Status: DC | PRN

## 2015-08-23 MED ORDER — GLUCOSE BLOOD VI STRP: 1.0000 | ORAL_STRIP | Freq: Three times a day (TID) | Status: DC

## 2015-08-23 NOTE — Assessment & Plan Note (Signed)
Refilled nizoral cream 2%

## 2015-08-23 NOTE — Progress Notes (Signed)
F/U DM  No medication x 3 weeks  Stated has a big cup of soda 30 min ago. No suicide thought in the past two weeks

## 2015-08-23 NOTE — Patient Instructions (Addendum)
Avary was seen today for diabetes.  Diagnoses and all orders for this visit:  Uncontrolled type 1 diabetes mellitus with hyperglycemia (HCC) -     POCT glycosylated hemoglobin (Hb A1C) -     POCT glucose (manual entry) -     POCT urinalysis dipstick -     Microalbumin/Creatinine Ratio, Urine -     insulin aspart (novoLOG) injection 20 Units; Inject 0.2 mLs (20 Units total) into the skin once. -     Blood Glucose Monitoring Suppl (TRUE METRIX METER) W/DEVICE KIT; 1 each by Does not apply route as needed. -     TRUEPLUS LANCETS 28G MISC; 1 each by Does not apply route 3 (three) times daily. -     glucose blood (TRUE METRIX BLOOD GLUCOSE TEST) test strip; 1 each by Other route 3 (three) times daily. -     Ambulatory referral to Ophthalmology -     COMPLETE METABOLIC PANEL WITH GFR  Phimosis -     ketoconazole (NIZORAL) 2 % cream; Apply 1 application topically 2 (two) times daily as needed for irritation.  Onychomycosis of toenail -     COMPLETE METABOLIC PANEL WITH GFR   Diabetes blood sugar goals  Fasting (in AM before breakfast, 8 hrs of no eating or drinking (except water or unsweetened coffee or tea): 90-110 2 hrs after meals: < 160,   No low sugars: nothing < 70   Bring sugar log to pharmacy f/u in 3 weeks F/u with me in 3 months for IDDM type 1  Dr. Adrian Blackwater

## 2015-08-23 NOTE — Progress Notes (Signed)
Patient ID: BLUE WINTHER, male   DOB: 13-Aug-1991, 24 y.o.   MRN: [id number]   Subjective:  Patient ID: Jonathan Porter, male    DOB: [birth year]  Age: 24 y.o. MRN: [id number]  CC: Diabetes   HPI Jonathan Porter presents for    1. CHRONIC DIABETES  Disease Monitoring  Blood Sugar Ranges: high, out of insulin for 3 weeks   Polyuria: no   Visual problems: yes   Medication Compliance: no, out of insulin for 3 weeks   Medication Side Effects  Hypoglycemia: no   Preventitive Health Care  Eye Exam: due   Foot Exam: done today   Diet pattern: high carb, sugar sweetened drinks   Exercise: minimal   2. Phimosis: planning on circumcision. Using antifungal cream in the meantime. Requesting refill.   Social History  Substance Use Topics  . Smoking status: Current Every Day Smoker -- 0.25 packs/day for 6 years    Types: Cigarettes  . Smokeless tobacco: Never Used  . Alcohol Use: No   Outpatient Prescriptions Prior to Visit  Medication Sig Dispense Refill  . Blood Glucose Monitoring Suppl (BLOOD GLUCOSE METER KIT AND SUPPLIES) KIT Dispense based on patient and insurance preference. Use up to four times daily as directed.  Provide True Result brand. 1 each 0  . insulin aspart protamine- aspart (NOVOLOG MIX 70/30) (70-30) 100 UNIT/ML injection Inject 0.25 mLs (25 Units total) into the skin 2 (two) times daily with a meal. 30 mL 3  . ondansetron (ZOFRAN ODT) 4 MG disintegrating tablet 71m ODT q4 hours prn nausea/vomit 8 tablet 0   No facility-administered medications prior to visit.    ROS Review of Systems  Constitutional: Negative for fever, chills, fatigue and unexpected weight change.  Eyes: Positive for visual disturbance.  Respiratory: Negative for cough and shortness of breath.   Cardiovascular: Negative for chest pain, palpitations and leg swelling.  Gastrointestinal: Negative for nausea, vomiting, abdominal pain, diarrhea, constipation and blood in stool.  Endocrine:  Negative for polydipsia, polyphagia and polyuria.  Musculoskeletal: Negative for myalgias, back pain, arthralgias, gait problem and neck pain.  Skin: Negative for rash.  Allergic/Immunologic: Negative for immunocompromised state.  Hematological: Negative for adenopathy. Does not bruise/bleed easily.  Psychiatric/Behavioral: Negative for suicidal ideas, sleep disturbance and dysphoric mood. The patient is not nervous/anxious.     Objective:  BP 121/81 mmHg  Pulse 95  Temp(Src) 98.5 F (36.9 C) (Oral)  Resp 18  Ht _0  (1.803 m)  Wt 229 lb (103.874 kg)  BMI 31.95 kg/m2  SpO2 100%  BP/Weight 08/23/2015 63/5/597431/02/3844 Systolic BP [id number] Diastolic BP 81 79 58  Wt. (Lbs) 229 - -  BMI 31.95 - 32.54   Physical Exam  Constitutional: He appears well-developed and well-nourished. No distress.  HENT:  Head: Normocephalic and atraumatic.  Neck: Normal range of motion. Neck supple.  Cardiovascular: Normal rate, regular rhythm, normal heart sounds and intact distal pulses.   Pulmonary/Chest: Effort normal and breath sounds normal.  Musculoskeletal: He exhibits no edema.  Neurological: He is alert.  Skin: Skin is warm and dry. No rash noted. No erythema.  Psychiatric: He has a normal mood and affect.   Lab Results  Component Value Date   HGBA1C >15.0 02/23/2015   Lab Results  Component Value Date   HGBA1C >15.0 08/23/2015    CBG > 600 UA: 500 glucose, small LE, negative ketones  Treated with 20 U of novolog  Repeat CBG after  30 minutes still > 600   Assessment & Plan:   Problem List Items Addressed This Visit    Diabetes type 1, uncontrolled ([place]) - Primary    A: IDDM type 1 with hyperglycemia, no ketones  P: Refilled novolog 70/30 Close f/u       Relevant Medications   insulin aspart (novoLOG) injection 20 Units (Completed)   Blood Glucose Monitoring Suppl (TRUE METRIX METER) W/DEVICE KIT   TRUEPLUS LANCETS 28G MISC   glucose blood (TRUE METRIX BLOOD  GLUCOSE TEST) test strip   Other Relevant Orders   POCT glycosylated hemoglobin (Hb A1C) (Completed)   POCT glucose (manual entry) (Completed)   POCT urinalysis dipstick (Completed)   Microalbumin/Creatinine Ratio, Urine   Ambulatory referral to Ophthalmology   COMPLETE METABOLIC PANEL WITH GFR   POCT glucose (manual entry) (Completed)   Onychomycosis of toenail    A: onychomycosis P:  lamisil       Relevant Medications   ketoconazole (NIZORAL) 2 % cream   terbinafine (LAMISIL) 250 MG tablet   Other Relevant Orders   COMPLETE METABOLIC PANEL WITH GFR   Phimosis    Refilled nizoral cream 2%      Relevant Medications   ketoconazole (NIZORAL) 2 % cream      Meds ordered this encounter  Medications  . insulin aspart (novoLOG) injection 20 Units    Sig:     Follow-up: No Follow-up on file.   Boykin Nearing MD

## 2015-08-23 NOTE — Telephone Encounter (Signed)
novolog refilled I just saw this request today during pre-visit planning as it was mistakenly routed to wrong person.  Called to patient's godmother/emergency contact to apologize for delay. Left VM apologizing for delay in refill, refill sent. Requested call back with questions.

## 2015-08-23 NOTE — Assessment & Plan Note (Signed)
A: IDDM type 1 with hyperglycemia, no ketones  P: Refilled novolog 70/30 Close f/u

## 2015-08-23 NOTE — Assessment & Plan Note (Signed)
A: onychomycosis P:  lamisil

## 2015-08-24 ENCOUNTER — Telehealth: Payer: Self-pay | Admitting: *Deleted

## 2015-08-24 LAB — COMPLETE METABOLIC PANEL WITH GFR
ALT: 14 U/L (ref 9–46)
AST: 11 U/L (ref 10–40)
Albumin: 4.2 g/dL (ref 3.6–5.1)
Alkaline Phosphatase: 131 U/L — ABNORMAL HIGH (ref 40–115)
BILIRUBIN TOTAL: 0.6 mg/dL (ref 0.2–1.2)
BUN: 8 mg/dL (ref 7–25)
CALCIUM: 9.7 mg/dL (ref 8.6–10.3)
CO2: 27 mmol/L (ref 20–31)
CREATININE: 1.14 mg/dL (ref 0.60–1.35)
Chloride: 90 mmol/L — ABNORMAL LOW (ref 98–110)
GFR, Est Non African American: 88 mL/min (ref >=60)
Glucose, Bld: 657 mg/dL (ref 65–99)
Potassium: 3.9 mmol/L (ref 3.5–5.3)
Sodium: 128 mmol/L — ABNORMAL LOW (ref 135–146)
TOTAL PROTEIN: 7.7 g/dL (ref 6.1–8.1)

## 2015-08-24 LAB — MICROALBUMIN / CREATININE URINE RATIO
CREATININE, URINE: 28 mg/dL (ref 20–370)
MICROALB UR: 0.6 mg/dL
Microalb Creat Ratio: 21 mcg/mg creat (ref <30)

## 2015-08-24 NOTE — Telephone Encounter (Signed)
Left voice message to return call at emergency contact

## 2015-09-03 ENCOUNTER — Telehealth: Payer: Self-pay

## 2015-09-03 NOTE — Telephone Encounter (Signed)
-----   Message from Dessa PhiJosalyn Funches, MD sent at 08/24/2015  9:07 AM EST ----- Negative urine microalbumin Low sodium in setting of high sugar, high sugar treated in office yesterday. Low sodium is most likely chronic  Will repeat sodium at f/u

## 2015-09-03 NOTE — Telephone Encounter (Signed)
Tried to call patient this am Number is incorrect Message states please check the number and try your call again later

## 2016-03-06 ENCOUNTER — Ambulatory Visit: Payer: Self-pay | Admitting: Family Medicine

## 2016-04-18 ENCOUNTER — Ambulatory Visit: Payer: Self-pay | Attending: Family Medicine | Admitting: Family Medicine

## 2016-04-18 ENCOUNTER — Encounter: Payer: Self-pay | Admitting: Family Medicine

## 2016-04-18 VITALS — BP 117/69 | HR 73 | Temp 98.3°F | Resp 16 | Ht 71.0 in | Wt 208.0 lb

## 2016-04-18 DIAGNOSIS — E1065 Type 1 diabetes mellitus with hyperglycemia: Secondary | ICD-10-CM | POA: Insufficient documentation

## 2016-04-18 DIAGNOSIS — F17210 Nicotine dependence, cigarettes, uncomplicated: Secondary | ICD-10-CM | POA: Insufficient documentation

## 2016-04-18 DIAGNOSIS — Z794 Long term (current) use of insulin: Secondary | ICD-10-CM | POA: Insufficient documentation

## 2016-04-18 DIAGNOSIS — B351 Tinea unguium: Secondary | ICD-10-CM | POA: Insufficient documentation

## 2016-04-18 DIAGNOSIS — A630 Anogenital (venereal) warts: Secondary | ICD-10-CM | POA: Insufficient documentation

## 2016-04-18 DIAGNOSIS — Z79899 Other long term (current) drug therapy: Secondary | ICD-10-CM | POA: Insufficient documentation

## 2016-04-18 LAB — POCT URINALYSIS DIPSTICK
Bilirubin, UA: NEGATIVE
Glucose, UA: 500
Ketones, UA: 40
LEUKOCYTES UA: NEGATIVE
Nitrite, UA: NEGATIVE
PROTEIN UA: NEGATIVE
Spec Grav, UA: <=1.005
UROBILINOGEN UA: 0.2
pH, UA: 5

## 2016-04-18 LAB — POCT GLYCOSYLATED HEMOGLOBIN (HGB A1C)

## 2016-04-18 LAB — GLUCOSE, POCT (MANUAL RESULT ENTRY)
POC Glucose: 525 mg/dl — AB (ref 70–99)
POC Glucose: 420 mg/dl — AB (ref 70–99)

## 2016-04-18 MED ORDER — SODIUM CHLORIDE 0.9 % IV BOLUS (SEPSIS)
1000.0000 mL | Freq: Once | INTRAVENOUS | Status: AC
  Administered 2016-04-18: 1000 mL via INTRAVENOUS

## 2016-04-18 MED ORDER — IMIQUIMOD 5 % EX CREA: TOPICAL_CREAM | CUTANEOUS | 0 refills | Status: DC

## 2016-04-18 MED ORDER — INSULIN ASPART 100 UNIT/ML ~~LOC~~ SOLN
20.0000 [IU] | Freq: Once | SUBCUTANEOUS | Status: AC
  Administered 2016-04-18: 20 [IU] via SUBCUTANEOUS

## 2016-04-18 MED ORDER — SODIUM CHLORIDE 0.9 % IV BOLUS (SEPSIS): 1000.0000 mL | Freq: Once | INTRAVENOUS | Status: DC

## 2016-04-18 MED ORDER — TERBINAFINE HCL 250 MG PO TABS: 250.0000 mg | ORAL_TABLET | Freq: Every day | ORAL | 2 refills | Status: DC

## 2016-04-18 MED ORDER — INSULIN GLARGINE 100 UNIT/ML SOLOSTAR PEN: 10.0000 [IU] | PEN_INJECTOR | Freq: Every day | SUBCUTANEOUS | 99 refills | Status: DC

## 2016-04-18 MED ORDER — INSULIN ASPART 100 UNIT/ML ~~LOC~~ SOLN
20.0000 [IU] | Freq: Once | SUBCUTANEOUS | Status: AC
Start: 2016-04-18 — End: 2016-04-18
  Administered 2016-04-18: 20 [IU] via SUBCUTANEOUS

## 2016-04-18 MED ORDER — INSULIN PEN NEEDLE 31G X 8 MM MISC: 1.0000 "application " | Freq: Four times a day (QID) | 11 refills | Status: DC

## 2016-04-18 MED ORDER — INSULIN ASPART 100 UNIT/ML FLEXPEN: 10.0000 [IU] | PEN_INJECTOR | Freq: Three times a day (TID) | SUBCUTANEOUS | 11 refills | Status: DC

## 2016-04-18 MED FILL — ULTICARE PEN NDL 4MM 32G: 32G X 4 MM | 25 days supply | Qty: 100 | Fill #0

## 2016-04-18 MED FILL — TERBINAFINE HCL 250 MG TAB: 250 | 30 days supply | Qty: 30 | Fill #0

## 2016-04-18 MED FILL — !NOVOLOG FLEXPEN SYRINGE 1: 100/ML | 30 days supply | Qty: 9 | Fill #0

## 2016-04-18 MED FILL — LANTUS SOLOSTAR 100 UNITS/M: 100 | 30 days supply | Qty: 3 | Fill #0

## 2016-04-18 NOTE — Assessment & Plan Note (Signed)
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

## 2016-04-18 NOTE — Assessment & Plan Note (Signed)
Uncontrolled diabetes with hyperglycemia treated in office Stop 70/30 Start lantus 10 U daily  Start novolog 10 U TID with meals

## 2016-04-18 NOTE — Progress Notes (Signed)
Blood sugar is 420 taken at 1:00pm

## 2016-04-18 NOTE — Progress Notes (Signed)
F/U DM genital problems  elevated glucose today. Stated glucose running at home 180-260 Pt stated no medication x 1 month Pt stated has bump on genital area No itching no burning x 3 month  Tobacco and marijuana user. Used 1 pack per two days  Last marijuana use 04/17/2016 No suicidal thoughts in the past two weeks

## 2016-04-18 NOTE — Patient Instructions (Addendum)
Jonathan Porter was seen today for diabetes and rash.  Diagnoses and all orders for this visit:  Uncontrolled type 1 diabetes mellitus with hyperglycemia (HCC) -     HgB A1c -     Glucose (CBG) -     Insulin Glargine (LANTUS SOLOSTAR) 100 UNIT/ML Solostar Pen; Inject 10 Units into the skin daily at 10 pm. -     Hemoglobin A1c -     insulin aspart (NOVOLOG) 100 UNIT/ML FlexPen; Inject 10 Units into the skin 3 (three) times daily with meals. -     Insulin Pen Needle (B-D ULTRAFINE III SHORT PEN) 31G X 8 MM MISC; 1 application by Does not apply route 4 (four) times daily. -     Cancel: BASIC METABOLIC PANEL WITH GFR -     COMPLETE METABOLIC PANEL WITH GFR  Hyperglycemia due to type 1 diabetes mellitus (HCC) -     POCT urinalysis dipstick -     insulin aspart (novoLOG) injection 20 Units; Inject 0.2 mLs (20 Units total) into the skin once. -     sodium chloride 0.9 % bolus 1,000 mL; Inject 1,000 mLs into the vein once. 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  Other orders -     imiquimod (ALDARA) 5 % cream; Apply topically 3 (three) times a week. Apply a thin layer 3 times per week (on alternate days) prior to bedtime; leave on skin for 6 to 10 hours, then remove with mild soap and water. Continue until there is total clearance of the genital/perianal warts or for a maximum duration of therapy of 16 weeks  stop all 70/30 insulin  Start lantus and novolog  F/u in 2 weeks for diabetes, CBG check  Dr. Armen Pickup   Genital Warts Genital warts are a common STD (sexually transmitted disease). They may appear as small bumps on the tissues of the genital area or anal area. Sometimes, they can become irritated and cause pain. Genital warts  are easily passed to other people through sexual contact. Getting treatment is important because genital warts can lead to other problems. In females, the virus that causes genital warts may increase the risk of cervical cancer. CAUSES Genital warts are caused by a virus that is called human papillomavirus (HPV). HPV is spread by having unprotected sex with an infected person. It can be spread through vaginal, anal, and oral sex. Many people do not know that they are infected. They may be infected for years without problems. However, even if they do not have problems, they can pass the infection to their sexual partners. RISK FACTORS Genital warts are more likely to develop in:  People who have unprotected sex.  People who have multiple sexual partners.  People who become sexually active before they are 27 years of age.  Men who are not circumcised.  Women who have a male sexual partner who is not circumcised.  People who have a weakened body defense system (immune system) due to disease or medicine.  People who smoke. SYMPTOMS Symptoms of genital warts include:  Small growths in the genital area or anal area. These warts often grow in clusters.  Itching and irritation in the genital area or anal area.  Bleeding from the warts.  Painful sexual intercourse. DIAGNOSIS Genital warts can usually be diagnosed from their appearance on the vagina, vulva, penis, perineum, anus, or rectum. Tests may also be done, such as:  Biopsy. A tissue sample is removed so it can be looked at under a microscope.  Colposcopy. In females, a magnifying tool is used to examine the vagina and cervix. Certain solutions may be used to make the HPV cells change color so they can be seen more easily.  A Pap test in females.  Tests for other STDs. TREATMENT Treatment for genital warts may include:  Applying prescription medicines to the warts. These may be solutions or creams.  Freezing the warts with  liquid nitrogen (cryotherapy).  Burning the warts with:  Laser treatment.  An electrified probe (electrocautery).  Injecting a substance (Candida antigen or Trichophyton antigen) into the warts to help the body's immune system to fight off the warts.  Interferon injections.  Surgery to remove the warts. HOME CARE INSTRUCTIONS Medicines  Apply over-the-counter and prescription medicines only as told by your health care provider.  Do not treat genital warts with medicines that are used for treating hand warts.  Talk with your health care provider about using over-the-counter anti-itch creams. General Instructions  Do not touch or scratch the warts.  Do not have sex until your treatment has been completed.  Tell your current and past sexual partners about your condition because they may also need treatment.  Keep all follow-up visits as told by your health care provider. This is important.  After treatment, use condoms during sex to prevent future infections. Other Instructions for Women  Women who have genital warts might need increased screening for cervical cancer. This type of cancer is slow growing and can be cured if it is found early. Chances of developing cervical cancer are increased with HPV.  If you become pregnant, tell your health care provider that you have had HPV. Your health care provider will monitor you closely during pregnancy to be sure that your baby is safe. PREVENTION Talk with your health care provider about getting the HPV vaccines. These vaccines prevent some HPV infections and cancers. It is recommended that the vaccine be given to males and females who are 65-45 years of age. It will not work if you already have HPV, and it is not recommended for pregnant women. SEEK MEDICAL CARE IF:  You have redness, swelling, or pain in the area of the treated skin.  You have a fever.  You feel generally ill.  You feel lumps in and around your genital area or  anal area.  You have bleeding in your genital area or anal area.  You have pain during sexual intercourse.   This information is not intended to replace advice given to you by your health care provider. Make sure you discuss any questions you have with your health care provider.   Document Released: 09/05/2000 Document Revised: 05/30/2015 Document Reviewed: 12/04/2014 Elsevier Interactive Patient Education [employer]

## 2016-04-18 NOTE — Progress Notes (Signed)
Subjective:  Patient ID: Jonathan Porter, male    DOB: 1991/06/25  Age: 25 y.o. MRN: [id number]  CC: Diabetes and Rash (genital area )   HPI Jonathan Porter has uncontrolled IDDM he presents for   1. IDDM: non compliant with insulin for past 30 days. Exercising. Has lost weight. Not compliant with low carb diet. He drinks soda.   2. Genital lesions: x 90 days. He has 3 sex partners currently. He has had 101-5 partners in his lifetime. He does not consistently use condoms. He denies penile discharge. He denies dysuria.   Social History  Substance Use Topics  . Smoking status: Current Every Day Smoker    Packs/day: 0.25    Years: 6.00    Types: Cigarettes  . Smokeless tobacco: Never Used  . Alcohol use No    Outpatient Medications Prior to Visit  Medication Sig Dispense Refill  . Blood Glucose Monitoring Suppl (TRUE METRIX METER) W/DEVICE KIT 1 each by Does not apply route as needed. 1 kit 0  . glucose blood (TRUE METRIX BLOOD GLUCOSE TEST) test strip 1 each by Other route 3 (three) times daily. 100 each 11  . TRUEPLUS LANCETS 28G MISC 1 each by Does not apply route 3 (three) times daily. 100 each 11  . insulin aspart protamine- aspart (NOVOLOG MIX 70/30) (70-30) 100 UNIT/ML injection Inject 0.25 mLs (25 Units total) into the skin 2 (two) times daily with a meal. (Patient not taking: Reported on 04/18/2016) 30 mL 3  . ketoconazole (NIZORAL) 2 % cream Apply 1 application topically 2 (two) times daily as needed for irritation. (Patient not taking: Reported on 04/18/2016) 60 g 0  . terbinafine (LAMISIL) 250 MG tablet Take 1 tablet (250 mg total) by mouth daily. (Patient not taking: Reported on 04/18/2016) 30 tablet 2   No facility-administered medications prior to visit.     ROS Review of Systems  Constitutional: Negative for chills, fatigue, fever and unexpected weight change.  Eyes: Negative for visual disturbance.  Respiratory: Negative for cough and shortness of breath.     Cardiovascular: Negative for chest pain, palpitations and leg swelling.  Gastrointestinal: Negative for abdominal pain, blood in stool, constipation, diarrhea, nausea and vomiting.  Endocrine: Negative for polydipsia, polyphagia and polyuria.  Genitourinary: Positive for genital sores. Negative for discharge, penile pain, penile swelling, scrotal swelling and testicular pain.  Musculoskeletal: Negative for arthralgias, back pain, gait problem, myalgias and neck pain.  Skin: Positive for rash.  Allergic/Immunologic: Negative for immunocompromised state.  Hematological: Negative for adenopathy. Does not bruise/bleed easily.  Psychiatric/Behavioral: Negative for dysphoric mood, sleep disturbance and suicidal ideas. The patient is not nervous/anxious.     Objective:  BP 117/69 (BP Location: Left Arm, Patient Position: Sitting, Cuff Size: Large)   Pulse 73   Temp 98.3 F (36.8 C) (Oral)   Resp 16   Ht _0  (1.803 m)   Wt 208 lb (94.3 kg)   SpO2 98%   BMI 29.01 kg/m   BP/Weight 04/18/2016 91/05/1659 6/0/0459  Systolic BP 977 414 239  Diastolic BP 69 81 79  Wt. (Lbs) 208 229 -  BMI 29.01 31.95 -    Physical Exam  Constitutional: He appears well-developed and well-nourished. No distress.  HENT:  Head: Normocephalic and atraumatic.  Neck: Normal range of motion. Neck supple.  Cardiovascular: Normal rate, regular rhythm, normal heart sounds and intact distal pulses.   Pulmonary/Chest: Effort normal and breath sounds normal.  Genitourinary: Testes normal.  Uncircumcised.  Musculoskeletal: He exhibits no edema.  Neurological: He is alert.  Skin: Skin is warm and dry. No rash noted. No erythema.  Psychiatric: He has a normal mood and affect.    CBG 525 Lab Results  Component Value Date   HGBA1C >15.0 08/23/2015    UA: moderate blood and 40 ketones   Treated with novolog 20 U SQ Treated with NS, IV fluid bolus  Repeat CBG- reads Hi  Treated with another 20 U of  novolog 1 L NS IV fluid bolus  Repeat CBG 420  Assessment & Plan:  Jovi was seen today for diabetes and rash.  Diagnoses and all orders for this visit:  Uncontrolled type 1 diabetes mellitus with hyperglycemia (HCC) -     HgB A1c -     Glucose (CBG) -     Insulin Glargine (LANTUS SOLOSTAR) 100 UNIT/ML Solostar Pen; Inject 10 Units into the skin daily at 10 pm. -     Hemoglobin A1c -     insulin aspart (NOVOLOG) 100 UNIT/ML FlexPen; Inject 10 Units into the skin 3 (three) times daily with meals. -     Insulin Pen Needle (B-D ULTRAFINE III SHORT PEN) 31G X 8 MM MISC; 1 application by Does not apply route 4 (four) times daily. -     Cancel: BASIC METABOLIC PANEL WITH GFR -     COMPLETE METABOLIC PANEL WITH GFR  Hyperglycemia due to type 1 diabetes mellitus (HCC) -     POCT urinalysis dipstick -     insulin aspart (novoLOG) injection 20 Units; Inject 0.2 mLs (20 Units total) into the skin once. -     sodium chloride 0.9 % bolus 1,000 mL; Inject 1,000 mLs into the vein once. -     Glucose (CBG) -     insulin aspart (novoLOG) injection 20 Units; Inject 0.2 mLs (20 Units total) into the skin once. -     sodium chloride 0.9 % bolus 1,000 mL; Inject 1,000 mLs into the vein once. 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  Other orders -     imiquimod (ALDARA) 5 % cream; Apply topically 3 (three) times a week. Apply a thin layer 3 times per week (on alternate days) prior to bedtime; leave on skin for 6 to 10 hours, then remove with mild soap and water. Continue until there is total clearance of the genital/perianal warts or for a maximum duration of therapy of 16 weeks    No orders of the defined types were placed in this  encounter.   Follow-up: Return in about 2 weeks (around 05/02/2016) for diabetes .   Boykin Nearing MD

## 2016-04-19 LAB — COMPLETE METABOLIC PANEL WITH GFR
ALT: 20 U/L (ref 9–46)
AST: 18 U/L (ref 10–40)
Albumin: 3.7 g/dL (ref 3.6–5.1)
Alkaline Phosphatase: 96 U/L (ref 40–115)
BUN: 11 mg/dL (ref 7–25)
CHLORIDE: 105 mmol/L (ref 98–110)
CO2: 21 mmol/L (ref 20–31)
Calcium: 8.9 mg/dL (ref 8.6–10.3)
Creat: 0.91 mg/dL (ref 0.60–1.35)
GFR, Est African American: >89 mL/min (ref >=60)
GFR, Est Non African American: >89 mL/min (ref >=60)
GLUCOSE: 395 mg/dL — AB (ref 65–99)
POTASSIUM: 3.9 mmol/L (ref 3.5–5.3)
SODIUM: 137 mmol/L (ref 135–146)
Total Bilirubin: 0.3 mg/dL (ref 0.2–1.2)
Total Protein: 6.6 g/dL (ref 6.1–8.1)

## 2016-04-19 LAB — RPR

## 2016-04-19 LAB — HEMOGLOBIN A1C
Hgb A1c MFr Bld: 17.2 % — ABNORMAL HIGH (ref <5.7)
Mean Plasma Glucose: 447 mg/dL

## 2016-04-19 LAB — HIV ANTIBODY (ROUTINE TESTING W REFLEX): HIV 1&2 Ab, 4th Generation: NONREACTIVE

## 2016-04-21 ENCOUNTER — Telehealth: Payer: Self-pay

## 2016-04-21 ENCOUNTER — Encounter: Payer: Self-pay | Admitting: Family Medicine

## 2016-04-21 LAB — HSV(HERPES SMPLX)ABS-I+II(IGG+IGM)-BLD: Herpes Simplex Vrs I&II-IgM Ab (EIA): 1.07 INDEX

## 2016-04-22 NOTE — Telephone Encounter (Signed)
Patients results were mailed out to him on 7/31

## 2016-04-25 MED FILL — IMIQUIMOD 5% CREAM PACKET: 5 | 90 days supply | Qty: 12 | Fill #0

## 2016-06-05 ENCOUNTER — Telehealth: Payer: Self-pay | Admitting: Family Medicine

## 2016-06-05 DIAGNOSIS — B351 Tinea unguium: Secondary | ICD-10-CM

## 2016-06-05 DIAGNOSIS — A630 Anogenital (venereal) warts: Secondary | ICD-10-CM

## 2016-06-05 NOTE — Telephone Encounter (Signed)
Will forward request to Dr. Funches 

## 2016-06-05 NOTE — Telephone Encounter (Signed)
Patient needs a refill for lamisil, And aldara. Please follow up.

## 2016-06-06 MED ORDER — IMIQUIMOD 5 % EX CREA: TOPICAL_CREAM | CUTANEOUS | 5 refills | Status: DC

## 2016-06-06 MED ORDER — TERBINAFINE HCL 250 MG PO TABS: 250.0000 mg | ORAL_TABLET | Freq: Every day | ORAL | 2 refills | Status: DC

## 2016-06-06 MED FILL — TERBINAFINE HCL 250 MG TAB: 250 | 30 days supply | Qty: 30 | Fill #0

## 2016-06-06 MED FILL — IMIQUIMOD 5% CREAM PACKET: 5 | 14 days supply | Qty: 24 | Fill #0

## 2016-06-06 NOTE — Telephone Encounter (Signed)
Med refill sent in to pharmacy Please inform patient

## 2016-06-09 NOTE — Telephone Encounter (Signed)
Pt has no phone number on file

## 2016-07-28 MED FILL — ULTICARE PEN NDL 4MM 32G: 32G X 4 MM | 25 days supply | Qty: 100 | Fill #1

## 2016-07-28 MED FILL — NOVOLOG FLEXPEN SYRINGE: 100 | 30 days supply | Qty: 9 | Fill #1

## 2016-07-28 MED FILL — LANTUS SOLOSTAR 100 UNITS/M: 100 | 30 days supply | Qty: 3 | Fill #1

## 2016-08-31 ENCOUNTER — Emergency Department (HOSPITAL_COMMUNITY)
Admission: EM | Admit: 2016-08-31 | Discharge: 2016-08-31 | Disposition: A | Discharge status: Home / Self Care | Payer: Medicaid Other | Attending: Emergency Medicine | Admitting: Emergency Medicine

## 2016-08-31 ENCOUNTER — Encounter (HOSPITAL_COMMUNITY): Payer: Self-pay | Admitting: Emergency Medicine

## 2016-08-31 DIAGNOSIS — Z79899 Other long term (current) drug therapy: Secondary | ICD-10-CM | POA: Diagnosis not present

## 2016-08-31 DIAGNOSIS — E860 Dehydration: Secondary | ICD-10-CM | POA: Diagnosis not present

## 2016-08-31 DIAGNOSIS — E1065 Type 1 diabetes mellitus with hyperglycemia: Secondary | ICD-10-CM | POA: Diagnosis not present

## 2016-08-31 DIAGNOSIS — R112 Nausea with vomiting, unspecified: Secondary | ICD-10-CM

## 2016-08-31 DIAGNOSIS — R197 Diarrhea, unspecified: Secondary | ICD-10-CM

## 2016-08-31 DIAGNOSIS — R739 Hyperglycemia, unspecified: Secondary | ICD-10-CM

## 2016-08-31 DIAGNOSIS — F17210 Nicotine dependence, cigarettes, uncomplicated: Secondary | ICD-10-CM | POA: Diagnosis not present

## 2016-08-31 LAB — BASIC METABOLIC PANEL
Anion gap: 11 (ref 5–15)
BUN: 17 mg/dL (ref 6–20)
CHLORIDE: 102 mmol/L (ref 101–111)
CO2: 22 mmol/L (ref 22–32)
CREATININE: 1.13 mg/dL (ref 0.61–1.24)
Calcium: 10 mg/dL (ref 8.9–10.3)
GFR calc Af Amer: >60 mL/min (ref >60)
GFR calc non Af Amer: >60 mL/min (ref >60)
Glucose, Bld: 570 mg/dL (ref 65–99)
Potassium: 3.9 mmol/L (ref 3.5–5.1)
SODIUM: 135 mmol/L (ref 135–145)

## 2016-08-31 LAB — BLOOD GAS, VENOUS
Acid-base deficit: 3.6 mmol/L — ABNORMAL HIGH (ref 0.0–2.0)
BICARBONATE: 21.3 mmol/L (ref 20.0–28.0)
FIO2: 21
O2 Saturation: 98.7 %
PATIENT TEMPERATURE: 98.6
pCO2, Ven: 40 mmHg — ABNORMAL LOW (ref 44.0–60.0)
pH, Ven: 7.346 (ref 7.250–7.430)
pO2, Ven: 137 mmHg — ABNORMAL HIGH (ref 32.0–45.0)

## 2016-08-31 LAB — CBG MONITORING, ED
Glucose-Capillary: 567 mg/dL (ref 65–99)
Glucose-Capillary: 579 mg/dL (ref 65–99)

## 2016-08-31 LAB — CBC
HCT: 49.6 % (ref 39.0–52.0)
Hemoglobin: 16.9 g/dL (ref 13.0–17.0)
MCH: 27 pg (ref 26.0–34.0)
MCHC: 34.1 g/dL (ref 30.0–36.0)
MCV: 79.4 fL (ref 78.0–100.0)
PLATELETS: 330 10*3/uL (ref 150–400)
RBC: 6.25 MIL/uL — ABNORMAL HIGH (ref 4.22–5.81)
RDW: 13.1 % (ref 11.5–15.5)
WBC: 8.1 10*3/uL (ref 4.0–10.5)

## 2016-08-31 LAB — URINALYSIS, ROUTINE W REFLEX MICROSCOPIC
Bilirubin Urine: NEGATIVE
Hgb urine dipstick: NEGATIVE
Ketones, ur: NEGATIVE mg/dL
Leukocytes, UA: NEGATIVE
Nitrite: NEGATIVE
Protein, ur: NEGATIVE mg/dL
SPECIFIC GRAVITY, URINE: 1.039 — AB (ref 1.005–1.030)
pH: 5 (ref 5.0–8.0)

## 2016-08-31 LAB — HEPATIC FUNCTION PANEL
ALT: 23 U/L (ref 17–63)
AST: 20 U/L (ref 15–41)
Albumin: 4.5 g/dL (ref 3.5–5.0)
Alkaline Phosphatase: 126 U/L (ref 38–126)
Bilirubin, Direct: <0.1 mg/dL — ABNORMAL LOW (ref 0.1–0.5)
TOTAL PROTEIN: 8.8 g/dL — AB (ref 6.5–8.1)
Total Bilirubin: 0.8 mg/dL (ref 0.3–1.2)

## 2016-08-31 LAB — LIPASE, BLOOD: Lipase: 14 U/L (ref 11–51)

## 2016-08-31 MED ORDER — SODIUM CHLORIDE 0.9 % IV BOLUS (SEPSIS)
2000.0000 mL | Freq: Once | INTRAVENOUS | Status: AC
  Administered 2016-08-31: 2000 mL via INTRAVENOUS

## 2016-08-31 MED ORDER — ONDANSETRON 4 MG PO TBDP: 4.0000 mg | ORAL_TABLET | Freq: Three times a day (TID) | ORAL | 0 refills | Status: DC | PRN

## 2016-08-31 MED ORDER — ONDANSETRON HCL 4 MG/2ML IJ SOLN
4.0000 mg | Freq: Once | INTRAMUSCULAR | Status: AC
Start: 2016-08-31 — End: 2016-08-31
  Administered 2016-08-31: 4 mg via INTRAVENOUS
  Filled 2016-08-31: qty 2

## 2016-08-31 NOTE — ED Notes (Signed)
Patient d/c'd self care.  F/U and medications reviewed.  Patient verbalized understanding. 

## 2016-08-31 NOTE — ED Triage Notes (Signed)
Pt reports having generalized weakness that started today. Pt reports having hx of DM TYPE 1. Pt reports having vomiting and diarrhea today. Pt reports being unable to keep fluids down at this time.

## 2016-08-31 NOTE — ED Notes (Signed)
Patient given diet ginger ale to drink.

## 2016-08-31 NOTE — ED Provider Notes (Signed)
[facility] DEPT Provider Note   CSN: [id number] Arrival date & time: 08/31/16  2107     History   Chief Complaint Chief Complaint  Patient presents with  . Hyperglycemia    HPI Jonathan Porter is a 25 y.o. male.  HPI  25 year old male presents with nausea and vomiting and diarrhea starting this morning. He has a history of type 1 diabetes. He states he is taking his insulin as instructed. He is not sure how his glucose has been recently because he does not have a functioning glucometer at this time, a recently broke. He denies fevers but has had chills. He's vomited about 10 times today. He's also had multiple loose watery stools. Some vague abdominal cramping that is in generalized. This comes and goes. No urinary symptoms. He has had DKA before but is not sure if it felt like this.  Past Medical History:  Diagnosis Date  . Diabetes mellitus without complication [facility])     Patient Active Problem List   Diagnosis Date Noted  . 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 Diabetes type 1, uncontrolled ([place]) 08/23/2015  . Onychomycosis of toenail 08/23/2015  . Phimosis 08/07/2014    Past Surgical History:  Procedure Laterality Date  . NO PAST SURGERIES         Home Medications    Prior to Admission medications   Medication Sig Start Date End Date Taking? Authorizing Provider  imiquimod (ALDARA) 5 % cream Apply topically 3 (three) times a week. Apply a thin layer 3 times per week (on alternate days) prior to bedtime; leave on skin for 6 to 10 hours, then remove with mild soap and water. Continue until there is total clearance of the genital/perianal warts or for a maximum duration of therapy of 16 weeks 06/06/16  Yes Josalyn Funches, MD  insulin aspart (NOVOLOG) 100 UNIT/ML FlexPen Inject 10 Units into the skin 3 (three) times daily with meals. 04/18/16  Yes Josalyn Funches, MD  Insulin Glargine (LANTUS SOLOSTAR) 100 UNIT/ML Solostar Pen Inject 10 Units  into the skin daily at 10 pm. 04/18/16  Yes Boykin Nearing, MD  Blood Glucose Monitoring Suppl (TRUE METRIX METER) W/DEVICE KIT 1 each by Does not apply route as needed. 08/23/15   Josalyn Funches, MD  glucose blood (TRUE METRIX BLOOD GLUCOSE TEST) test strip 1 each by Other route 3 (three) times daily. 08/23/15   Josalyn Funches, MD  Insulin Pen Needle (B-D ULTRAFINE III SHORT PEN) 31G X 8 MM MISC 1 application by Does not apply route 4 (four) times daily. 04/18/16   Josalyn Funches, MD  ondansetron (ZOFRAN ODT) 4 MG disintegrating tablet Take 1 tablet (4 mg total) by mouth every 8 (eight) hours as needed for nausea or vomiting. 08/31/16   Sherwood Gambler, MD  terbinafine (LAMISIL) 250 MG tablet Take 1 tablet (250 mg total) by mouth daily. Patient not taking: Reported on 08/31/2016 06/06/16   Boykin Nearing, MD  TRUEPLUS LANCETS 28G MISC 1 each by Does not apply route 3 (three) times daily. 08/23/15   Boykin Nearing, MD    Family History Family History  Problem Relation Age of Onset  . Thyroid disease Mother   . Diabetes Maternal Aunt   . Diabetes Maternal Grandmother   . Diabetes Maternal Grandfather   . Thyroid disease      Social History Social History  Substance Use Topics  . Smoking status: Current Every Day Smoker    Packs/day: 0.25    Years:  6.00    Types: Cigarettes  . Smokeless tobacco: Never Used  . Alcohol use No     Allergies   Patient has no known allergies.   Review of Systems Review of Systems  Constitutional: Positive for chills. Negative for fever.  Respiratory: Negative for shortness of breath.   Cardiovascular: Negative for chest pain.  Gastrointestinal: Positive for abdominal pain, diarrhea, nausea and vomiting. Negative for blood in stool.  Genitourinary: Negative for dysuria and hematuria.  Neurological: Positive for weakness.  All other systems reviewed and are negative.    Physical Exam Updated Vital Signs BP 140/67 (BP Location: Left Arm)    Pulse 92   Temp 97.9 F (36.6 C) (Oral)   Resp 19   Ht 6' (1.829 m)   Wt 215 lb (97.5 kg)   SpO2 99%   BMI 29.16 kg/m   Physical Exam  Constitutional: He is oriented to person, place, and time. He appears well-developed and well-nourished.  HENT:  Head: Normocephalic and atraumatic.  Right Ear: External ear normal.  Left Ear: External ear normal.  Nose: Nose normal.  Mouth/Throat: Oropharynx is clear and moist. No oropharyngeal exudate.  Eyes: Right eye exhibits no discharge. Left eye exhibits no discharge.  Neck: Neck supple.  Cardiovascular: Regular rhythm and normal heart sounds.  Tachycardia present.   Pulmonary/Chest: Effort normal and breath sounds normal.  Abdominal: Soft. He exhibits no distension. There is no tenderness.  Musculoskeletal: He exhibits no edema.  Neurological: He is alert and oriented to person, place, and time.  Skin: Skin is warm and dry.  Nursing note and vitals reviewed.    ED Treatments / Results  Labs (all labs ordered are listed, but only abnormal results are displayed) Labs Reviewed  BASIC METABOLIC PANEL - Abnormal; Notable for the following:       Result Value   Glucose, Bld 570 (*)    All other components within normal limits  CBC - Abnormal; Notable for the following:    RBC 6.25 (*)    All other components within normal limits  URINALYSIS, ROUTINE W REFLEX MICROSCOPIC - Abnormal; Notable for the following:    APPearance HAZY (*)    Specific Gravity, Urine 1.039 (*)    Glucose, UA >=500 (*)    Bacteria, UA RARE (*)    Squamous Epithelial / LPF 0-5 (*)    All other components within normal limits  BLOOD GAS, VENOUS - Abnormal; Notable for the following:    pCO2, Ven 40.0 (*)    pO2, Ven 137.0 (*)    Acid-base deficit 3.6 (*)    All other components within normal limits  HEPATIC FUNCTION PANEL - Abnormal; Notable for the following:    Total Protein 8.8 (*)    Bilirubin, Direct <0.1 (*)    All other components within normal  limits  CBG MONITORING, ED - Abnormal; Notable for the following:    Glucose-Capillary 567 (*)    All other components within normal limits  CBG MONITORING, ED - Abnormal; Notable for the following:    Glucose-Capillary 579 (*)    All other components within normal limits  LIPASE, BLOOD    EKG  EKG Interpretation  Date/Time:  Sunday August 31 2016 22:23:26 EST Ventricular Rate:  98 PR Interval:    QRS Duration: 87 QT Interval:  341 QTC Calculation: 436 R Axis:   99 Text Interpretation:  Sinus rhythm Borderline right axis deviation borderline T wave abnormalities No old tracing to compare Confirmed by  Caroljean Monsivais MD, Nicki Reaper [phone]) on 08/31/2016 10:47:17 PM       Radiology No results found.  Procedures Procedures (including critical care time)  Medications Ordered in ED Medications  sodium chloride 0.9 % bolus 2,000 mL (2,000 mLs Intravenous New Bag/Given 08/31/16 2216)  ondansetron (ZOFRAN) injection 4 mg (4 mg Intravenous Given 08/31/16 2216)     Initial Impression / Assessment and Plan / ED Course  I have reviewed the triage vital signs and the nursing notes.  Pertinent labs & imaging results that were available during my care of the patient were reviewed by me and considered in my medical decision making (see chart for details).  Clinical Course as of Sep 01 2347  Nancy Fetter Aug 31, 2016  2151 Abdominal exam is benign. Patient will be given IV fluids, placed on cardiac monitor, Zofran for nausea/vomiting, and will eval for DKA.  [SG]    Clinical Course User Index [SG] Sherwood Gambler, MD    Patient's HR originally was in the 130s, but now is in 90s and he feels much better. His anion gap is normal, pH normal, no ketones. No evidence of DKA. Given 2L of fluids. Now eating/drinking. Oddly his glucose is essentially the same. However given so much clinical improvement I think he is stable for discharge. I discussed giving him his nightly insulin (he usually does 2 doses at 10  pm which he missed) however he declines these and wants to do them when he gets home. I think this is reasonable. Discussed need for ASAP PCP f/u this week and strict return precautions.  Final Clinical Impressions(s) / ED Diagnoses   Final diagnoses:  Hyperglycemia  Dehydration  Nausea vomiting and diarrhea    New Prescriptions New Prescriptions   ONDANSETRON (ZOFRAN ODT) 4 MG DISINTEGRATING TABLET    Take 1 tablet (4 mg total) by mouth every 8 (eight) hours as needed for nausea or vomiting.     Sherwood Gambler, MD 08/31/16 2352

## 2016-08-31 NOTE — ED Notes (Signed)
Spoke to MD about patient.  Per MD patient advised feels better and would like to go home.  Patient advised that has his insulin that he takes at home.  MD to discharge.

## 2016-09-08 MED FILL — LANTUS SOLOSTAR 100 UNITS/M: 100 | 30 days supply | Qty: 3 | Fill #2

## 2016-09-08 MED FILL — NOVOLOG FLEXPEN SYRINGE: 100 | 30 days supply | Qty: 9 | Fill #2

## 2016-10-30 ENCOUNTER — Ambulatory Visit: Payer: Medicaid Other | Admitting: Family Medicine

## 2017-01-20 ENCOUNTER — Emergency Department (HOSPITAL_COMMUNITY)
Admission: EM | Admit: 2017-01-20 | Discharge: 2017-01-21 | Discharge status: Against Medical Advice | Payer: Medicaid Other | Attending: Emergency Medicine | Admitting: Emergency Medicine

## 2017-01-20 ENCOUNTER — Encounter (HOSPITAL_COMMUNITY): Payer: Self-pay | Admitting: Emergency Medicine

## 2017-01-20 DIAGNOSIS — L03012 Cellulitis of left finger: Secondary | ICD-10-CM | POA: Diagnosis present

## 2017-01-20 DIAGNOSIS — E109 Type 1 diabetes mellitus without complications: Secondary | ICD-10-CM | POA: Insufficient documentation

## 2017-01-20 DIAGNOSIS — F17210 Nicotine dependence, cigarettes, uncomplicated: Secondary | ICD-10-CM | POA: Diagnosis not present

## 2017-01-20 DIAGNOSIS — R739 Hyperglycemia, unspecified: Secondary | ICD-10-CM

## 2017-01-20 DIAGNOSIS — Z79899 Other long term (current) drug therapy: Secondary | ICD-10-CM | POA: Diagnosis not present

## 2017-01-20 DIAGNOSIS — M79645 Pain in left finger(s): Secondary | ICD-10-CM | POA: Diagnosis present

## 2017-01-20 LAB — CBG MONITORING, ED: Glucose-Capillary: >600 mg/dL (ref 65–99)

## 2017-01-20 MED ORDER — NAPROXEN 500 MG PO TABS: 500.0000 mg | ORAL_TABLET | Freq: Two times a day (BID) | ORAL | 0 refills | Status: DC

## 2017-01-20 MED ORDER — CEPHALEXIN 500 MG PO CAPS: 500.0000 mg | ORAL_CAPSULE | Freq: Four times a day (QID) | ORAL | 0 refills | Status: DC

## 2017-01-20 MED ORDER — IBUPROFEN 800 MG PO TABS
800.0000 mg | ORAL_TABLET | Freq: Once | ORAL | Status: AC
  Administered 2017-01-20: 800 mg via ORAL
  Filled 2017-01-20: qty 1

## 2017-01-20 MED ORDER — PENTAFLUOROPROP-TETRAFLUOROETH EX AERO
INHALATION_SPRAY | CUTANEOUS | Status: DC | PRN
  Filled 2017-01-20: qty 103.5

## 2017-01-20 MED ORDER — CEPHALEXIN 250 MG PO CAPS
500.0000 mg | ORAL_CAPSULE | Freq: Once | ORAL | Status: AC
  Administered 2017-01-20: 500 mg via ORAL
  Filled 2017-01-20: qty 2

## 2017-01-20 NOTE — ED Triage Notes (Signed)
Pt presents to ED for assessment of right ring finger pain.  Pt has some redness and swelling to the cuticle of his finger.  Pt states he does chew his cuticles.  Denies any know injury, but was concerned for a possible spider bite.

## 2017-01-20 NOTE — ED Provider Notes (Signed)
[facility] DEPT Provider Note   CSN: [id number] Arrival date & time: 01/20/17  1932  By signing my name below, I, Dora Sims, attest that this documentation has been prepared under the direction and in the presence of [place]. Janit Bern, NP. Electronically Signed: Dora Sims, Scribe. 01/20/2017. 9:32 PM.  History   Chief Complaint Chief Complaint  Patient presents with  . Hand Pain   The history is provided by the patient. No language interpreter was used.  Hand Pain  This is a new problem. The current episode started 2 days ago. The problem occurs constantly. The problem has been gradually worsening. Pertinent negatives include no chest pain, no abdominal pain, no headaches and no shortness of breath. Exacerbated by: applied pressure. Nothing relieves the symptoms. He has tried nothing for the symptoms.   HPI Comments: Jonathan Porter is a 26 y.o. male with PMHx of DM who presents to the Emergency Department complaining of constant, gradually worsening left ring finger pain for a few days. He reports associated swelling and redness around the nailbed. He states his pain is worse with applied pressure to the distal portion of his left ring finger. Patient notes that he bites his fingernails and believes this has contributed to his symptoms. He has not tried any medications for his pain or swelling. Pt denies fevers, chills, or any other associated symptoms.  Past Medical History:  Diagnosis Date  . Diabetes mellitus without complication [facility])     Patient Active Problem List   Diagnosis Date Noted  . Paronychia of finger of left hand 01/20/2017  . 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 Diabetes type 1, uncontrolled ([place]) 08/23/2015  . Onychomycosis of toenail 08/23/2015  . Phimosis 08/07/2014    Past Surgical History:  Procedure Laterality Date  . NO PAST SURGERIES         Home Medications    Prior to Admission medications   Medication Sig Start  Date End Date Taking? Authorizing Provider  Blood Glucose Monitoring Suppl (TRUE METRIX METER) W/DEVICE KIT 1 each by Does not apply route as needed. 08/23/15   Josalyn Funches, MD  cephALEXin (KEFLEX) 500 MG capsule Take 1 capsule (500 mg total) by mouth 4 (four) times daily. 01/20/17   [place], NP  glucose blood (TRUE METRIX BLOOD GLUCOSE TEST) test strip 1 each by Other route 3 (three) times daily. 08/23/15   Josalyn Funches, MD  imiquimod (ALDARA) 5 % cream Apply topically 3 (three) times a week. Apply a thin layer 3 times per week (on alternate days) prior to bedtime; leave on skin for 6 to 10 hours, then remove with mild soap and water. Continue until there is total clearance of the genital/perianal warts or for a maximum duration of therapy of 16 weeks 06/06/16   Boykin Nearing, MD  insulin aspart (NOVOLOG) 100 UNIT/ML FlexPen Inject 10 Units into the skin 3 (three) times daily with meals. 04/18/16   Josalyn Funches, MD  Insulin Glargine (LANTUS SOLOSTAR) 100 UNIT/ML Solostar Pen Inject 10 Units into the skin daily at 10 pm. 04/18/16   Adriana Mccallum Funches, MD  Insulin Pen Needle (B-D ULTRAFINE III SHORT PEN) 31G X 8 MM MISC 1 application by Does not apply route 4 (four) times daily. 04/18/16   Josalyn Funches, MD  naproxen (NAPROSYN) 500 MG tablet Take 1 tablet (500 mg total) by mouth 2 (two) times daily. 01/20/17   Jakeob Tullis Bunnie Pion, NP  ondansetron (ZOFRAN ODT) 4 MG disintegrating tablet  Take 1 tablet (4 mg total) by mouth every 8 (eight) hours as needed for nausea or vomiting. 08/31/16   Sherwood Gambler, MD  terbinafine (LAMISIL) 250 MG tablet Take 1 tablet (250 mg total) by mouth daily. Patient not taking: Reported on 08/31/2016 06/06/16   Boykin Nearing, MD  TRUEPLUS LANCETS 28G MISC 1 each by Does not apply route 3 (three) times daily. 08/23/15   Boykin Nearing, MD    Family History Family History  Problem Relation Age of Onset  . Thyroid disease Mother   . Diabetes Maternal Aunt   . Diabetes  Maternal Grandmother   . Diabetes Maternal Grandfather   . Thyroid disease      Social History Social History  Substance Use Topics  . Smoking status: Current Every Day Smoker    Packs/day: 0.25    Years: 6.00    Types: Cigarettes  . Smokeless tobacco: Never Used  . Alcohol use No     Allergies   Patient has no known allergies.   Review of Systems Review of Systems  Constitutional: Negative for chills and fever.  Respiratory: Negative for shortness of breath.   Cardiovascular: Negative for chest pain.  Gastrointestinal: Negative for abdominal pain.  Musculoskeletal: Positive for arthralgias and joint swelling.  Skin: Positive for color change.  Neurological: Negative for headaches.  Psychiatric/Behavioral: Negative for confusion.   Physical Exam Updated Vital Signs BP 110/82 (BP Location: Right Arm)   Pulse (!) 114   Temp 99.1 F (37.3 C) (Oral)   Resp 17   Ht _0  (1.803 m)   Wt 99.8 kg   SpO2 98%   BMI 30.68 kg/m   Physical Exam  Constitutional: He is oriented to person, place, and time. He appears well-developed and well-nourished. No distress.  HENT:  Head: Normocephalic and atraumatic.  Eyes: Conjunctivae and EOM are normal.  Neck: Neck supple. No tracheal deviation present.  Cardiovascular: Tachycardia present.   Pulmonary/Chest: Effort normal. No respiratory distress.  Musculoskeletal: Normal range of motion. He exhibits tenderness.  Left ring finger: Erythema and an infection of the folds of the tissue surrounding the nail. There is tenderness.  Neurological: He is alert and oriented to person, place, and time.  Skin: Skin is warm and dry. There is erythema.  Psychiatric: He has a normal mood and affect. His behavior is normal.  Nursing note and vitals reviewed.  Patient is a diabetic so I ordered CBG that reads >600. Discussed with the patient need for additional labs and concern of his blood sugar reading so high. Patient states that he has  insulin at home the he can take and that he will not stay because he has to go to work.   ED Treatments / Results  Labs  (all labs ordered are listed, but only abnormal results are displayed) Labs Reviewed  CBG MONITORING, ED - Abnormal; Notable for the following:       Result Value   Glucose-Capillary >600 (*)    All other components within normal limits  CBC WITH DIFFERENTIAL/PLATELET  COMPREHENSIVE METABOLIC PANEL   Radiology No results found.  Procedures .Marland KitchenIncision and Drainage Date/Time: 01/20/2017 10:24 PM Performed by: Ashley Murrain Authorized by: Ashley Murrain   Consent:    Consent obtained:  Verbal   Consent given by:  Patient Location:    Indications for incision and drainage: paronychia.   Location:  Upper extremity   Upper extremity location:  Finger   Finger location:  L small finger Pre-procedure  details:    Skin preparation:  Betadine Anesthesia (see MAR for exact dosages):    Anesthesia method: Gebauer's aerosol spray. Procedure type:    Complexity:  Simple Procedure details:    Incision types:  Single straight   Incision depth:  Dermal   Scalpel blade:  11   Drainage:  Purulent   Drainage amount:  Copious   Wound treatment:  Wound left open   Packing materials:  None Post-procedure details:    Patient tolerance of procedure:  Tolerated well, no immediate complications   (including critical care time)  DIAGNOSTIC STUDIES: Oxygen Saturation is 98% on RA, normal by my interpretation.    COORDINATION OF CARE: 9:45 PM Discussed treatment plan with pt at bedside and pt agreed to plan.  Medications Ordered in ED Medications  pentafluoroprop-tetrafluoroeth (GEBAUERS) aerosol (not administered)  cephALEXin (KEFLEX) capsule 500 mg (500 mg Oral Given 01/20/17 2308)  ibuprofen (ADVIL,MOTRIN) tablet 800 mg (800 mg Oral Given 01/20/17 2308)     Initial Impression / Assessment and Plan / ED Course  I have reviewed the triage vital signs and the nursing  notes. 11:30 PM: Dr. Laverta Baltimore spoke to patient and informed him that his elevated blood sugar could be a life-threatening situation. Patient acknowledged the severity of his CBG but still wants to leave AMA.  Final Clinical Impressions(s) / ED Diagnoses   Final diagnoses:  Paronychia of finger of left hand  Hyperglycemia    New Prescriptions New Prescriptions   CEPHALEXIN (KEFLEX) 500 MG CAPSULE    Take 1 capsule (500 mg total) by mouth 4 (four) times daily.   NAPROXEN (NAPROSYN) 500 MG TABLET    Take 1 tablet (500 mg total) by mouth 2 (two) times daily.   I personally performed the services described in this documentation, which was scribed in my presence. The recorded information has been reviewed and is accurate.    [place], [place] 01/20/17 [place], MD 01/21/17 1058

## 2017-01-20 NOTE — Discharge Instructions (Signed)
It is important for you to check your blood sugar and follow up with your doctor. You blood sugar tonight is dangerously high!!! Since you are leaving AMA you know this could be life threatening. Marland Kitchen

## 2017-01-21 NOTE — ED Notes (Signed)
Pt never got transferred to E44, I never got involve with pt care.

## 2017-02-25 MED FILL — TRUEPLUS PEN NDL 32GX5/32: 32G X 4 MM | 25 days supply | Qty: 100 | Fill #2

## 2017-02-25 MED FILL — TERBINAFINE HCL 250 MG TAB: 250 | 30 days supply | Qty: 30 | Fill #1

## 2017-02-25 MED FILL — TRUEPLUS PEN NDL 32GX5/32": 32G X 4 MM | 25 days supply | Qty: 100 | Fill #2

## 2017-02-25 MED FILL — LANTUS SOLOSTAR 100 UNITS/M: 100 | 30 days supply | Qty: 3 | Fill #3

## 2017-02-25 MED FILL — NOVOLOG FLEXPEN SYRINGE: 100 | 30 days supply | Qty: 9 | Fill #3

## 2017-04-08 ENCOUNTER — Telehealth: Payer: Self-pay | Admitting: General Practice

## 2017-04-08 NOTE — Telephone Encounter (Signed)
Pt called to try to schedule appt, he was inform that has a year from the last time he saw the PCP, that he need to be re-est. 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 available until September, he did not like the idea and he claim I wasn't helping him, I try to tell him that he can go to the Urgent care to been see there and to call us after, but he never let me finish, he will call back another day, he claim

## 2017-04-28 ENCOUNTER — Encounter (HOSPITAL_COMMUNITY): Payer: Self-pay

## 2017-04-28 ENCOUNTER — Ambulatory Visit (INDEPENDENT_AMBULATORY_CARE_PROVIDER_SITE_OTHER): Payer: Medicaid Other

## 2017-04-28 ENCOUNTER — Ambulatory Visit (HOSPITAL_COMMUNITY)
Admission: EM | Admit: 2017-04-28 | Discharge: 2017-04-28 | Disposition: A | Discharge status: Home / Self Care | Payer: Medicaid Other | Attending: Family Medicine | Admitting: Family Medicine

## 2017-04-28 DIAGNOSIS — M79641 Pain in right hand: Secondary | ICD-10-CM | POA: Diagnosis not present

## 2017-04-28 DIAGNOSIS — S6991XA Unspecified injury of right wrist, hand and finger(s), initial encounter: Secondary | ICD-10-CM | POA: Diagnosis not present

## 2017-04-28 NOTE — Progress Notes (Signed)
Orthopedic Tech Progress Note Patient Details:  Jonathan QuamLarry J Porter 12/09/1990 [id number]  Ortho Devices Type of Ortho Device: Ace wrap, Ulna gutter splint Ortho Device/Splint Location: RUE Ortho Device/Splint Interventions: Ordered, Application   Jennye MoccasinHughes, Kenzie Thoreson Craig 04/28/2017, 6:10 PM
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
## 2018-05-09 ENCOUNTER — Other Ambulatory Visit: Payer: Self-pay

## 2018-05-09 ENCOUNTER — Encounter (HOSPITAL_COMMUNITY): Payer: Self-pay

## 2018-05-09 ENCOUNTER — Emergency Department (HOSPITAL_COMMUNITY)
Admission: EM | Admit: 2018-05-09 | Discharge: 2018-05-09 | Disposition: A | Discharge status: Home / Self Care | Payer: Medicaid Other | Attending: Emergency Medicine | Admitting: Emergency Medicine

## 2018-05-09 DIAGNOSIS — E1165 Type 2 diabetes mellitus with hyperglycemia: Secondary | ICD-10-CM | POA: Diagnosis present

## 2018-05-09 DIAGNOSIS — Z794 Long term (current) use of insulin: Secondary | ICD-10-CM | POA: Diagnosis not present

## 2018-05-09 DIAGNOSIS — Z5321 Procedure and treatment not carried out due to patient leaving prior to being seen by health care provider: Secondary | ICD-10-CM | POA: Insufficient documentation

## 2018-05-09 LAB — URINALYSIS, ROUTINE W REFLEX MICROSCOPIC
BILIRUBIN URINE: NEGATIVE
Bacteria, UA: NONE SEEN
HGB URINE DIPSTICK: NEGATIVE
Ketones, ur: NEGATIVE mg/dL
Leukocytes, UA: NEGATIVE
Nitrite: NEGATIVE
Protein, ur: NEGATIVE mg/dL
SPECIFIC GRAVITY, URINE: 1.031 — AB (ref 1.005–1.030)
pH: 6 (ref 5.0–8.0)

## 2018-05-09 LAB — BASIC METABOLIC PANEL
ANION GAP: 10 (ref 5–15)
BUN: 13 mg/dL (ref 6–20)
CALCIUM: 8.6 mg/dL — AB (ref 8.9–10.3)
CO2: 22 mmol/L (ref 22–32)
CREATININE: 1.1 mg/dL (ref 0.61–1.24)
Chloride: 94 mmol/L — ABNORMAL LOW (ref 98–111)
Glucose, Bld: 766 mg/dL (ref 70–99)
Potassium: 4.3 mmol/L (ref 3.5–5.1)
Sodium: 126 mmol/L — ABNORMAL LOW (ref 135–145)

## 2018-05-09 LAB — CBC
HCT: 43.7 % (ref 39.0–52.0)
HEMOGLOBIN: 14.3 g/dL (ref 13.0–17.0)
MCH: 26.4 pg (ref 26.0–34.0)
MCHC: 32.7 g/dL (ref 30.0–36.0)
MCV: 80.6 fL (ref 78.0–100.0)
PLATELETS: 271 10*3/uL (ref 150–400)
RBC: 5.42 MIL/uL (ref 4.22–5.81)
RDW: 12.5 % (ref 11.5–15.5)
WBC: 5.6 10*3/uL (ref 4.0–10.5)

## 2018-05-09 LAB — CBG MONITORING, ED

## 2018-05-09 NOTE — ED Notes (Signed)
Unable to locate pt in waiting area or BR

## 2018-05-09 NOTE — ED Notes (Signed)
Pt unable to be located in WR, BR or outside

## 2018-05-09 NOTE — ED Notes (Signed)
Unable to locate patient in waiting area. 

## 2018-05-09 NOTE — ED Triage Notes (Signed)
Pt states that he is switching doctors and that he has been out of his novolog and lantus for the past four days, CBG reading HIGH, c/o of increased urination, and thirst

## 2018-05-09 NOTE — ED Notes (Signed)
Date and time results received: 05/09/18 2016 (use smartphrase ".now" to insert current time)  Test: Glucose Critical Value: 766  Name of Provider Notified: [place]  Orders Received? Or Actions Taken?: Pt next to go back

## 2018-05-10 ENCOUNTER — Emergency Department (HOSPITAL_COMMUNITY)
Admission: EM | Admit: 2018-05-10 | Discharge: 2018-05-10 | Disposition: A | Discharge status: Home / Self Care | Payer: Medicaid Other | Attending: Emergency Medicine | Admitting: Emergency Medicine

## 2018-05-10 ENCOUNTER — Other Ambulatory Visit: Payer: Self-pay

## 2018-05-10 ENCOUNTER — Encounter (HOSPITAL_COMMUNITY): Payer: Self-pay | Admitting: *Deleted

## 2018-05-10 DIAGNOSIS — F17210 Nicotine dependence, cigarettes, uncomplicated: Secondary | ICD-10-CM | POA: Insufficient documentation

## 2018-05-10 DIAGNOSIS — Z794 Long term (current) use of insulin: Secondary | ICD-10-CM | POA: Insufficient documentation

## 2018-05-10 DIAGNOSIS — Z79899 Other long term (current) drug therapy: Secondary | ICD-10-CM | POA: Insufficient documentation

## 2018-05-10 DIAGNOSIS — E1065 Type 1 diabetes mellitus with hyperglycemia: Secondary | ICD-10-CM | POA: Insufficient documentation

## 2018-05-10 DIAGNOSIS — Z8639 Personal history of other endocrine, nutritional and metabolic disease: Secondary | ICD-10-CM

## 2018-05-10 DIAGNOSIS — R739 Hyperglycemia, unspecified: Secondary | ICD-10-CM

## 2018-05-10 LAB — I-STAT VENOUS BLOOD GAS, ED
ACID-BASE EXCESS: 1 mmol/L (ref 0.0–2.0)
BICARBONATE: 26.5 mmol/L (ref 20.0–28.0)
O2 Saturation: 90 %
PH VEN: 7.407 (ref 7.250–7.430)
TCO2: 28 mmol/L (ref 22–32)
pCO2, Ven: 42.1 mmHg — ABNORMAL LOW (ref 44.0–60.0)
pO2, Ven: 59 mmHg — ABNORMAL HIGH (ref 32.0–45.0)

## 2018-05-10 LAB — URINALYSIS, ROUTINE W REFLEX MICROSCOPIC
BILIRUBIN URINE: NEGATIVE
Bacteria, UA: NONE SEEN
Hgb urine dipstick: NEGATIVE
KETONES UR: NEGATIVE mg/dL
Leukocytes, UA: NEGATIVE
NITRITE: NEGATIVE
PH: 6 (ref 5.0–8.0)
Protein, ur: NEGATIVE mg/dL
Specific Gravity, Urine: 1.033 — ABNORMAL HIGH (ref 1.005–1.030)

## 2018-05-10 LAB — CBC
HCT: 44.9 % (ref 39.0–52.0)
Hemoglobin: 14.7 g/dL (ref 13.0–17.0)
MCH: 26.3 pg (ref 26.0–34.0)
MCHC: 32.7 g/dL (ref 30.0–36.0)
MCV: 80.5 fL (ref 78.0–100.0)
PLATELETS: 271 10*3/uL (ref 150–400)
RBC: 5.58 MIL/uL (ref 4.22–5.81)
RDW: 12.4 % (ref 11.5–15.5)
WBC: 7.3 10*3/uL (ref 4.0–10.5)

## 2018-05-10 LAB — BASIC METABOLIC PANEL
ANION GAP: 9 (ref 5–15)
BUN: 10 mg/dL (ref 6–20)
CALCIUM: 9.5 mg/dL (ref 8.9–10.3)
CO2: 26 mmol/L (ref 22–32)
Chloride: 98 mmol/L (ref 98–111)
Creatinine, Ser: 0.95 mg/dL (ref 0.61–1.24)
GFR calc Af Amer: >60 mL/min (ref >60)
Glucose, Bld: 496 mg/dL — ABNORMAL HIGH (ref 70–99)
Potassium: 3.8 mmol/L (ref 3.5–5.1)
Sodium: 133 mmol/L — ABNORMAL LOW (ref 135–145)

## 2018-05-10 LAB — CBG MONITORING, ED
GLUCOSE-CAPILLARY: 514 mg/dL — AB (ref 70–99)
Glucose-Capillary: 549 mg/dL (ref 70–99)

## 2018-05-10 MED ORDER — SODIUM CHLORIDE 0.9 % IV BOLUS
1000.0000 mL | Freq: Once | INTRAVENOUS | Status: AC
  Administered 2018-05-10: 1000 mL via INTRAVENOUS

## 2018-05-10 MED ORDER — INSULIN ASPART 100 UNIT/ML FLEXPEN: 10.0000 [IU] | PEN_INJECTOR | Freq: Three times a day (TID) | SUBCUTANEOUS | 1 refills | Status: DC

## 2018-05-10 MED ORDER — TRUE METRIX METER W/DEVICE KIT: 1.0000 | PACK | 0 refills | Status: DC | PRN

## 2018-05-10 MED ORDER — INSULIN GLARGINE 100 UNIT/ML SOLOSTAR PEN: 10.0000 [IU] | PEN_INJECTOR | Freq: Every day | SUBCUTANEOUS | 99 refills | Status: DC

## 2018-05-10 MED ORDER — INSULIN ASPART 100 UNIT/ML ~~LOC~~ SOLN: 5.0000 [IU] | Freq: Once | SUBCUTANEOUS | Status: DC
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
## 2018-05-10 NOTE — ED Notes (Signed)
Patient verbalizes understanding of discharge instructions. Opportunity for questioning and answers were provided. Armband removed by staff, pt discharged from ED ambulatory.   

## 2018-05-10 NOTE — Discharge Instructions (Signed)
You were seen here today for elevated blood sugars.  Your lab work was reassuring.  You are not found to be in diabetic ketoacidosis (a life-threatening complication of diabetes). I have refilled your home diabetic medications. Please monitor your blood sugars at home. See attached handouts. Follow up during your scheduled appointments at [facility] and wellness on 05/21/18.   Get help right away if: Your blood glucose levels continue to be high (elevated). Your monitor reads high even when you are taking insulin. You faint. You have chest pain. You have trouble breathing. You have a sudden, severe headache. You have sudden weakness in one arm or one leg. You have sudden trouble speaking or swallowing. You have vomiting or diarrhea that gets worse after 3 hours. You feel severely fatigued. You have trouble thinking. You have abdominal pain. You are severely dehydrated. Symptoms of severe dehydration include: Extreme thirst. Dry mouth. Blue lips. Cold hands and feet. Rapid breathing.

## 2018-05-10 NOTE — ED Triage Notes (Signed)
Pt in c/o hyperglycemia, states he is out of his insulin right now and needs a prescription refill, pt has not had insulin in 1 week, was here last night and left before being seen, no distress noted

## 2018-05-21 ENCOUNTER — Encounter: Payer: Self-pay | Admitting: Internal Medicine

## 2018-05-21 ENCOUNTER — Other Ambulatory Visit: Payer: Self-pay

## 2018-05-21 ENCOUNTER — Ambulatory Visit: Payer: Medicaid Other | Attending: Internal Medicine | Admitting: Internal Medicine

## 2018-05-21 VITALS — BP 120/86 | HR 73 | Temp 97.8°F | Resp 18 | Ht 72.0 in | Wt 201.8 lb

## 2018-05-21 DIAGNOSIS — F17210 Nicotine dependence, cigarettes, uncomplicated: Secondary | ICD-10-CM | POA: Diagnosis not present

## 2018-05-21 DIAGNOSIS — E1065 Type 1 diabetes mellitus with hyperglycemia: Secondary | ICD-10-CM

## 2018-05-21 DIAGNOSIS — E109 Type 1 diabetes mellitus without complications: Secondary | ICD-10-CM | POA: Insufficient documentation

## 2018-05-21 DIAGNOSIS — IMO0001 Reserved for inherently not codable concepts without codable children: Secondary | ICD-10-CM

## 2018-05-21 LAB — POCT GLYCOSYLATED HEMOGLOBIN (HGB A1C): HBA1C, POC (CONTROLLED DIABETIC RANGE): 10.9 % — AB (ref 0.0–7.0)

## 2018-05-21 LAB — GLUCOSE, POCT (MANUAL RESULT ENTRY): POC GLUCOSE: 288 mg/dL — AB (ref 70–99)

## 2018-05-21 MED ORDER — INSULIN ASPART 100 UNIT/ML FLEXPEN: 10.0000 [IU] | PEN_INJECTOR | Freq: Three times a day (TID) | SUBCUTANEOUS | 11 refills | Status: DC

## 2018-05-21 MED ORDER — INSULIN PEN NEEDLE 31G X 8 MM MISC: 1.0000 "application " | Freq: Four times a day (QID) | 11 refills | Status: DC

## 2018-05-21 MED ORDER — INSULIN GLARGINE 100 UNIT/ML SOLOSTAR PEN: 10.0000 [IU] | PEN_INJECTOR | Freq: Every day | SUBCUTANEOUS | 11 refills | Status: DC

## 2018-05-21 MED FILL — TRUEplus 5-BEVEL PEN NEEDLE: 31G X 8 MM | 25 days supply | Qty: 100 | Fill #0

## 2018-05-21 MED FILL — LANTUS SOLOSTAR 100 UNITS/M: 100 | 34 days supply | Qty: 15 | Fill #0

## 2018-05-21 MED FILL — NOVOLOG FLEXPEN SYRINGE: 100 | 34 days supply | Qty: 15 | Fill #0

## 2018-05-21 NOTE — Patient Instructions (Addendum)
Increase Lantus to 13 units.  Continue to check blood sugars 2-3 times a day before meals.  Increase Lantus by 2 units every 3 days if morning blood sugars persistently greater than 130.  Please bring blood sugar log with you on next visit.
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
## 2018-05-22 LAB — MICROALBUMIN / CREATININE URINE RATIO
CREATININE, UR: 134.2 mg/dL
MICROALBUM., U, RANDOM: 4.1 ug/mL
Microalb/Creat Ratio: 3.1 mg/g creat (ref 0.0–30.0)

## 2018-06-29 ENCOUNTER — Encounter (HOSPITAL_COMMUNITY): Payer: Self-pay

## 2018-06-29 ENCOUNTER — Emergency Department (HOSPITAL_COMMUNITY): Payer: Medicaid Other

## 2018-06-29 ENCOUNTER — Emergency Department (HOSPITAL_COMMUNITY)
Admission: EM | Admit: 2018-06-29 | Discharge: 2018-06-29 | Disposition: A | Discharge status: Home / Self Care | Payer: Medicaid Other | Attending: Emergency Medicine | Admitting: Emergency Medicine

## 2018-06-29 ENCOUNTER — Other Ambulatory Visit: Payer: Self-pay

## 2018-06-29 DIAGNOSIS — F17210 Nicotine dependence, cigarettes, uncomplicated: Secondary | ICD-10-CM | POA: Diagnosis not present

## 2018-06-29 DIAGNOSIS — R1011 Right upper quadrant pain: Secondary | ICD-10-CM | POA: Diagnosis not present

## 2018-06-29 DIAGNOSIS — Z794 Long term (current) use of insulin: Secondary | ICD-10-CM | POA: Diagnosis not present

## 2018-06-29 DIAGNOSIS — R634 Abnormal weight loss: Secondary | ICD-10-CM | POA: Insufficient documentation

## 2018-06-29 DIAGNOSIS — R1013 Epigastric pain: Secondary | ICD-10-CM

## 2018-06-29 DIAGNOSIS — E109 Type 1 diabetes mellitus without complications: Secondary | ICD-10-CM | POA: Insufficient documentation

## 2018-06-29 DIAGNOSIS — R109 Unspecified abdominal pain: Secondary | ICD-10-CM

## 2018-06-29 DIAGNOSIS — F1210 Cannabis abuse, uncomplicated: Secondary | ICD-10-CM | POA: Insufficient documentation

## 2018-06-29 DIAGNOSIS — R112 Nausea with vomiting, unspecified: Secondary | ICD-10-CM | POA: Insufficient documentation

## 2018-06-29 LAB — URINALYSIS, ROUTINE W REFLEX MICROSCOPIC
BACTERIA UA: NONE SEEN
BILIRUBIN URINE: NEGATIVE
Glucose, UA: >=500 mg/dL — AB
HGB URINE DIPSTICK: NEGATIVE
KETONES UR: 80 mg/dL — AB
LEUKOCYTES UA: NEGATIVE
NITRITE: NEGATIVE
Protein, ur: NEGATIVE mg/dL
SPECIFIC GRAVITY, URINE: 1.036 — AB (ref 1.005–1.030)
pH: 6 (ref 5.0–8.0)

## 2018-06-29 LAB — COMPREHENSIVE METABOLIC PANEL
ALBUMIN: 3.9 g/dL (ref 3.5–5.0)
ALK PHOS: 126 U/L (ref 38–126)
ALT: 27 U/L (ref 0–44)
AST: 17 U/L (ref 15–41)
Anion gap: 12 (ref 5–15)
BILIRUBIN TOTAL: 0.8 mg/dL (ref 0.3–1.2)
BUN: 9 mg/dL (ref 6–20)
CALCIUM: 9.5 mg/dL (ref 8.9–10.3)
CO2: 23 mmol/L (ref 22–32)
CREATININE: 0.91 mg/dL (ref 0.61–1.24)
Chloride: 99 mmol/L (ref 98–111)
GFR calc Af Amer: >60 mL/min (ref >60)
GFR calc non Af Amer: >60 mL/min (ref >60)
GLUCOSE: 416 mg/dL — AB (ref 70–99)
Potassium: 4 mmol/L (ref 3.5–5.1)
SODIUM: 134 mmol/L — AB (ref 135–145)
TOTAL PROTEIN: 7.6 g/dL (ref 6.5–8.1)

## 2018-06-29 LAB — CBC
HCT: 47.1 % (ref 39.0–52.0)
Hemoglobin: 15.3 g/dL (ref 13.0–17.0)
MCH: 26.4 pg (ref 26.0–34.0)
MCHC: 32.5 g/dL (ref 30.0–36.0)
MCV: 81.2 fL (ref 80.0–100.0)
Platelets: 289 10*3/uL (ref 150–400)
RBC: 5.8 MIL/uL (ref 4.22–5.81)
RDW: 13.2 % (ref 11.5–15.5)
WBC: 5.6 10*3/uL (ref 4.0–10.5)
nRBC: 0 % (ref 0.0–0.2)

## 2018-06-29 LAB — CBG MONITORING, ED
Glucose-Capillary: 395 mg/dL — ABNORMAL HIGH (ref 70–99)
Glucose-Capillary: 357 mg/dL — ABNORMAL HIGH (ref 70–99)

## 2018-06-29 LAB — LIPASE, BLOOD: Lipase: 19 U/L (ref 11–51)

## 2018-06-29 MED ORDER — METOCLOPRAMIDE HCL 10 MG PO TABS: 10.0000 mg | ORAL_TABLET | Freq: Four times a day (QID) | ORAL | 0 refills | Status: DC

## 2018-06-29 MED ORDER — METOCLOPRAMIDE HCL 5 MG/ML IJ SOLN
10.0000 mg | Freq: Once | INTRAMUSCULAR | Status: AC
  Administered 2018-06-29: 10 mg via INTRAVENOUS
  Filled 2018-06-29: qty 2

## 2018-06-29 MED ORDER — LACTATED RINGERS IV BOLUS
1000.0000 mL | Freq: Once | INTRAVENOUS | Status: AC
  Administered 2018-06-29: 1000 mL via INTRAVENOUS

## 2018-06-29 MED ORDER — ONDANSETRON 4 MG PO TBDP: 4.0000 mg | ORAL_TABLET | Freq: Once | ORAL | Status: DC

## 2018-06-29 NOTE — ED Notes (Signed)
C/o abd. Pain  With diarrhea onset several days ago , states he has a history of abd. Pain with vomiting x 3 years , states his PCP thinks its related to his Diabetes.

## 2018-06-29 NOTE — ED Notes (Signed)
Patient transported to [facility] 

## 2018-06-29 NOTE — ED Triage Notes (Signed)
Patient complains of 3 years of abdominal cramping and vomiting. States that he is diabetic and thinks he has gastroparesis. Alert and oriented, NAD

## 2018-06-29 NOTE — ED Provider Notes (Signed)
Patient placed in Quick Look pathway, seen and evaluated   Chief Complaint: abdominal pain  HPI:   Nausea, vomiting, upper abdominal "cramping" onset 2 days ago.  States symptoms have been intermittent, gradually worsening over 2-3 years.  He has been researching about gastroparesis and thinks this may be the reason.    ROS: negative: fevers, diarrhea, constipation, dysuria, flank pain.   Physical Exam:   Gen: No distress  Neuro: Awake and Alert  Skin: Warm    Focused Exam: Mild epigastric and RUQ tenderness. No G/R/R. No distention. No suprapubic or CVA tenderness. MMM.   Initiation of care has begun. The patient has been counseled on the process, plan, and necessity for staying for the completion/evaluation, and the remainder of the medical screening examination'   Jerrell Mylar 06/29/18 1445    Azalia Bilis, MD 06/29/18 1630

## 2018-06-29 NOTE — ED Provider Notes (Signed)
Jonathan Porter Note   CSN: [id number] Arrival date & time: 06/29/18  1320     History   Chief Complaint Chief Complaint  Patient presents with  . Abdominal Pain    HPI Jonathan Porter is a 27 y.o. male.  HPI   27 year old male with PMH significant for T1 DM presents with 2 3 days of progressively worsening abdominal pain.  Patient endorses to 3 years of mild epigastric abdominal pain, nausea and vomiting and 100 pound weight loss due to symptoms.  Over the last 2 3 days he has had market worsening of abdominal pain described as epigastric/right upper quadrant, squeezing/sharp, intermittent and colicky, nothing makes better, nothing makes it worse, associated with emesis too numerous to count, nonbloody.  Patient denies recent fevers or diarrhea.  Patient has been unable to take insulin for the last 2 to 3 days states a.m. sugars were in the low 300s.  Patient denies headache or changes in vision.  Denies polyuria, polydipsia or polyphagia.  Denies trauma or falls.  Past Medical History:  Diagnosis Date  . Diabetes mellitus without complication [facility])     Patient Active Problem List   Diagnosis Date Noted  . Paronychia of finger of left hand 01/20/2017  . 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 Diabetes type 1, uncontrolled ([place]) 08/23/2015  . Onychomycosis of toenail 08/23/2015  . Phimosis 08/07/2014    Past Surgical History:  Procedure Laterality Date  . NO PAST SURGERIES          Home Medications    Prior to Admission medications   Medication Sig Start Date End Date Taking? Authorizing Porter  insulin aspart (NOVOLOG) 100 UNIT/ML FlexPen Inject 10 Units into the skin 3 (three) times daily with meals. 05/21/18  Yes Ladell Pier, MD  Insulin Glargine (LANTUS SOLOSTAR) 100 UNIT/ML Solostar Pen Inject 10 Units into the skin daily at 10 pm. 05/21/18  Yes Ladell Pier, MD  Blood Glucose  Monitoring Suppl (TRUE METRIX METER) w/Device KIT 1 each by Does not apply route as needed. 05/10/18   Maczis, Barth Kirks, PA-C  glucose blood (TRUE METRIX BLOOD GLUCOSE TEST) test strip 1 each by Other route 3 (three) times daily. 08/23/15   Funches, Adriana Mccallum, MD  Insulin Pen Needle (B-D ULTRAFINE III SHORT PEN) 31G X 8 MM MISC 1 application by Does not apply route 4 (four) times daily. 05/21/18   Ladell Pier, MD  metoCLOPramide (REGLAN) 10 MG tablet Take 1 tablet (10 mg total) by mouth every 6 (six) hours. 06/29/18   Keenan Bachelor, MD  TRUEPLUS LANCETS 28G MISC 1 each by Does not apply route 3 (three) times daily. 08/23/15   Boykin Nearing, MD    Family History Family History  Problem Relation Age of Onset  . Thyroid disease Mother   . Diabetes Maternal Aunt   . Diabetes Maternal Grandmother   . Diabetes Maternal Grandfather   . Thyroid disease Unknown     Social History Social History   Tobacco Use  . Smoking status: Current Every Day Smoker    Packs/day: 0.25    Years: 6.00    Pack years: 1.50    Types: Cigarettes  . Smokeless tobacco: Never Used  Substance Use Topics  . Alcohol use: Yes    Comment: social   . Drug use: Yes    Types: Marijuana    Comment: last used 04/17/2016     Allergies  Shellfish allergy   Review of Systems Review of Systems  Constitutional: Negative for chills and fever.  HENT: Negative for ear pain and sore throat.   Eyes: Negative for pain and visual disturbance.  Respiratory: Negative for cough and shortness of breath.   Cardiovascular: Negative for chest pain, palpitations and leg swelling.  Gastrointestinal: Positive for abdominal pain, nausea and vomiting.  Genitourinary: Negative for dysuria and hematuria.  Musculoskeletal: Negative for arthralgias and back pain.  Skin: Negative for color change and rash.  Neurological: Negative for seizures and syncope.  All other systems reviewed and are negative.    Physical Exam Updated  Vital Signs BP (!) 132/92 (BP Location: Left Arm)   Pulse 71   Temp 98 F (36.7 C) (Oral)   Resp 16   SpO2 98%   Physical Exam  Constitutional: He appears well-developed and well-nourished.  HENT:  Head: Normocephalic and atraumatic.  Eyes: Conjunctivae are normal.  Neck: Neck supple.  Cardiovascular: Normal rate and regular rhythm.  No murmur heard. Pulmonary/Chest: Effort normal and breath sounds normal. No respiratory distress.  Abdominal: Soft. There is tenderness in the right upper quadrant and epigastric area. There is positive Murphy's sign. There is no rigidity, no rebound, no guarding, no CVA tenderness and no tenderness at McBurney's point.  Musculoskeletal: He exhibits no edema.  Neurological: He is alert.  Skin: Skin is warm and dry. Capillary refill takes less than 2 seconds.  Psychiatric: He has a normal mood and affect.  Nursing note and vitals reviewed.    ED Treatments / Results  Labs (all labs ordered are listed, but only abnormal results are displayed) Labs Reviewed  COMPREHENSIVE METABOLIC PANEL - Abnormal; Notable for the following components:      Result Value   Sodium 134 (*)    Glucose, Bld 416 (*)    All other components within normal limits  URINALYSIS, ROUTINE W REFLEX MICROSCOPIC - Abnormal; Notable for the following components:   Specific Gravity, Urine 1.036 (*)    Glucose, UA >=500 (*)    Ketones, ur 80 (*)    All other components within normal limits  CBG MONITORING, ED - Abnormal; Notable for the following components:   Glucose-Capillary 395 (*)    All other components within normal limits  CBG MONITORING, ED - Abnormal; Notable for the following components:   Glucose-Capillary 357 (*)    All other components within normal limits  LIPASE, BLOOD  CBC    EKG EKG Interpretation  Date/Time:  Tuesday June 29 2018 16:02:38 EDT Ventricular Rate:  69 PR Interval:  92 QRS Duration: 90 QT Interval:  402 QTC Calculation: 430 R  Axis:   91 Text Interpretation:  Sinus rhythm with short PR Rightward axis Septal infarct , age undetermined Abnormal ECG Confirmed by Quintella Reichert [id number]) on 06/29/2018 4:21:27 PM   Radiology US Abdomen Limited Ruq  Result Date: 06/29/2018 CLINICAL DATA:  Abdominal pain for 2 days. EXAM: ULTRASOUND ABDOMEN LIMITED RIGHT UPPER QUADRANT COMPARISON:  None. FINDINGS: Gallbladder: No gallstones or wall thickening visualized. No sonographic Murphy sign noted by sonographer. Common bile duct: Diameter: 2.8 mm Liver: No focal lesion identified. Within normal limits in parenchymal echogenicity. Portal vein is patent on color Doppler imaging with normal direction of blood flow towards the liver. IMPRESSION: Normal right upper quadrant ultrasound. Electronically Signed   By: Fidela Salisbury M.D.   On: 06/29/2018 17:14    Procedures Procedures (including critical care time)  Medications Ordered in ED Medications  ondansetron (ZOFRAN-ODT) disintegrating tablet 4 mg (4 mg Oral Not Given 06/29/18 1636)  metoCLOPramide (REGLAN) injection 10 mg (10 mg Intravenous Given 06/29/18 1636)  lactated ringers bolus 1,000 mL (1,000 mLs Intravenous New Bag/Given 06/29/18 1734)     Initial Impression / Assessment and Plan / ED Course  I have reviewed the triage vital signs and the nursing notes.  Pertinent labs & imaging results that were available during my care of the patient were reviewed by me and considered in my medical decision making (see chart for details).     27 year old male with PMH significant for T1 DM presents with 2 3 days of progressively worsening abdominal pain.  History as above.  DDX includes gastroparesis, biliary colic, cholecystitis, cholelithiasis, or gastritis.  Patient given Reglan for symptoms.  Labs and imaging performed which revealed blood sugar 416, no leukocytosis, normal LFTs, normal creatinine, normal anion gap.  Negative urine for infection or ketones.  RUQ [place] negative  for biliary pathology.  Patient with improvement of symptoms on Reglan.  Patient given IV fluids for sugar.  Repeat sugar 357.  Patient without evidence of DKA.  Patient with negative biliary work-up.  Patient with likely gastroparesis if formal diagnosis cannot be made without [place] emptying study.  Patient given referral to lobe our group for GI evaluation for gastroparesis in setting of chronic uncontrolled type 1 diabetes.  Patient given prescription for Reglan to control symptoms until appointment.  Patient stable and ready for discharge.  Patient seen initially attending Dr. Ayesha Rumpf who agrees with plan and disposition.  Final Clinical Impressions(s) / ED Diagnoses   Final diagnoses:  Abdominal pain  Epigastric pain    ED Discharge Orders         Ordered    metoCLOPramide (REGLAN) 10 MG tablet  Every 6 hours     06/29/18 1918           Keenan Bachelor, MD 06/29/18 Mendel Ryder, MD 07/04/18 1445

## 2018-06-29 NOTE — ED Notes (Signed)
Pt tolerating diet ginger ale fine with no nausea or vomiting.

## 2018-06-29 NOTE — ED Notes (Signed)
Patient states does not want nausea medication at this time.

## 2018-07-02 ENCOUNTER — Ambulatory Visit: Payer: Medicaid Other | Attending: Internal Medicine | Admitting: Internal Medicine

## 2018-07-02 ENCOUNTER — Encounter: Payer: Self-pay | Admitting: Internal Medicine

## 2018-07-02 VITALS — BP 120/74 | HR 71 | Temp 97.9°F | Resp 16 | Wt 205.4 lb

## 2018-07-02 DIAGNOSIS — A630 Anogenital (venereal) warts: Secondary | ICD-10-CM | POA: Diagnosis not present

## 2018-07-02 DIAGNOSIS — Z79899 Other long term (current) drug therapy: Secondary | ICD-10-CM | POA: Insufficient documentation

## 2018-07-02 DIAGNOSIS — Z8619 Personal history of other infectious and parasitic diseases: Secondary | ICD-10-CM

## 2018-07-02 DIAGNOSIS — IMO0001 Reserved for inherently not codable concepts without codable children: Secondary | ICD-10-CM

## 2018-07-02 DIAGNOSIS — E109 Type 1 diabetes mellitus without complications: Secondary | ICD-10-CM | POA: Insufficient documentation

## 2018-07-02 DIAGNOSIS — K3184 Gastroparesis: Secondary | ICD-10-CM | POA: Diagnosis not present

## 2018-07-02 DIAGNOSIS — F17210 Nicotine dependence, cigarettes, uncomplicated: Secondary | ICD-10-CM | POA: Diagnosis not present

## 2018-07-02 DIAGNOSIS — Z794 Long term (current) use of insulin: Secondary | ICD-10-CM | POA: Insufficient documentation

## 2018-07-02 DIAGNOSIS — Z2821 Immunization not carried out because of patient refusal: Secondary | ICD-10-CM

## 2018-07-02 DIAGNOSIS — E1065 Type 1 diabetes mellitus with hyperglycemia: Secondary | ICD-10-CM

## 2018-07-02 LAB — POCT URINALYSIS DIP (CLINITEK)
BILIRUBIN UA: NEGATIVE
BILIRUBIN UA: NEGATIVE mg/dL
Blood, UA: NEGATIVE
LEUKOCYTES UA: NEGATIVE
NITRITE UA: NEGATIVE
POC,PROTEIN,UA: NEGATIVE
Spec Grav, UA: 1.015 (ref 1.010–1.025)
Urobilinogen, UA: 0.2 E.U./dL
pH, UA: 6 (ref 5.0–8.0)

## 2018-07-02 LAB — GLUCOSE, POCT (MANUAL RESULT ENTRY)
POC Glucose: 420 mg/dl — AB (ref 70–99)
POC Glucose: 432 mg/dl — AB (ref 70–99)

## 2018-07-02 MED ORDER — INSULIN GLARGINE 100 UNIT/ML SOLOSTAR PEN: 15.0000 [IU] | PEN_INJECTOR | Freq: Every day | SUBCUTANEOUS | 11 refills | Status: DC

## 2018-07-02 MED ORDER — IMIQUIMOD 5 % EX CREA: TOPICAL_CREAM | CUTANEOUS | 0 refills | Status: DC

## 2018-07-02 MED ORDER — INSULIN ASPART 100 UNIT/ML ~~LOC~~ SOLN
10.0000 [IU] | Freq: Once | SUBCUTANEOUS | Status: AC
  Administered 2018-07-02: 10 [IU] via SUBCUTANEOUS

## 2018-07-02 NOTE — Progress Notes (Signed)
[id number]

## 2018-07-02 NOTE — Patient Instructions (Signed)
Please remember to bring a blood sugar readings or glucometer with you on your next visit.  I will have you follow-up with our clinical pharmacist in 1 month.  I have referred you to an endocrinologist for further management of your diabetes.  We have ordered a gastric emptying study to check for gastroparesis.  I recommend eating smaller but more frequent meals.  Avoid eating meals that are high in sugar.  Use the medication Reglan that was prescribed from the emergency room only as needed.  You should stop taking that medication at least 1 week before your gastric emptying study.

## 2018-07-02 NOTE — Progress Notes (Signed)
Patient ID: Jonathan Porter, male    DOB: 06-Nov-1990  MRN: [id number]  CC: Diabetes   Subjective: Jonathan Porter is a 27 y.o. male who presents for f/u DM His concerns today include:  Patient with history of insulin-dependent diabetes, genital warts.  DM:  He forgot to bring log/meter with him.  Reports max BS of 170.  Nothing over 200.  Checking in mornings 15-20 mins after after BF and at bedtime.  Blood sugar today is in the 400s.  He states that he just ate lunch and did not have his NovoLog with him. Seen in ED 06/29/2018  for bloating, N/V and  cramping abdominal pain.  He did not have any diarrhea.  Reports intermittent episodes over past few yrs.   wgh loss over past 2 yrs. diagnosed with possible gastroparesis and was placed on Reglan.  He is just about to get the prescription filled today.  Since the ER visit he has not had any further episodes of nausea/vomiting. Med: on Lantus 12 units which he reports to take consistently.  Takes the Novolog at least 2 x a day.  He misses a dose because he does not have his pen with him all the time.  Takes 10-15 units  Wants RF on Aldara which he uses whenever he has a flareup of genital warts.  Reports he does not have any now but he wanted to keep Aldara on hand. Patient Active Problem List   Diagnosis Date Noted  . Paronychia of finger of left hand 01/20/2017  . 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 Diabetes type 1, uncontrolled ([place]) 08/23/2015  . Onychomycosis of toenail 08/23/2015  . Phimosis 08/07/2014     Current Outpatient Medications on File Prior to Visit  Medication Sig Dispense Refill  . Blood Glucose Monitoring Suppl (TRUE METRIX METER) w/Device KIT 1 each by Does not apply route as needed. 1 kit 0  . glucose blood (TRUE METRIX BLOOD GLUCOSE TEST) test strip 1 each by Other route 3 (three) times daily. 100 each 11  . insulin aspart (NOVOLOG) 100 UNIT/ML FlexPen Inject 10 Units into the skin 3 (three)  times daily with meals. 15 mL 11  . Insulin Pen Needle (B-D ULTRAFINE III SHORT PEN) 31G X 8 MM MISC 1 application by Does not apply route 4 (four) times daily. 120 each 11  . metoCLOPramide (REGLAN) 10 MG tablet Take 1 tablet (10 mg total) by mouth every 6 (six) hours. 30 tablet 0  . TRUEPLUS LANCETS 28G MISC 1 each by Does not apply route 3 (three) times daily. 100 each 11   No current facility-administered medications on file prior to visit.     Allergies  Allergen Reactions  . Shellfish Allergy Anaphylaxis    Social History   Socioeconomic History  . Marital status: Single    Spouse name: Not on file  . Number of children: Not on file  . Years of education: Not on file  . Highest education level: Not on file  Occupational History  . Not on file  Social Needs  . Financial resource strain: Not on file  . Food insecurity:    Worry: Not on file    Inability: Not on file  . Transportation needs:    Medical: Not on file    Non-medical: Not on file  Tobacco Use  . Smoking status: Current Every Day Smoker    Packs/day: 0.25    Years: 6.00  Pack years: 1.50    Types: Cigarettes  . Smokeless tobacco: Never Used  Substance and Sexual Activity  . Alcohol use: Yes    Comment: social   . Drug use: Yes    Types: Marijuana    Comment: last used 04/17/2016  . Sexual activity: Yes    Birth control/protection: Condom    Comment: stated used condom 80% of the time   Lifestyle  . Physical activity:    Days per week: Not on file    Minutes per session: Not on file  . Stress: Not on file  Relationships  . Social connections:    Talks on phone: Not on file    Gets together: Not on file    Attends religious service: Not on file    Active member of club or organization: Not on file    Attends meetings of clubs or organizations: Not on file    Relationship status: Not on file  . Intimate partner violence:    Fear of current or ex partner: Not on file    Emotionally abused:  Not on file    Physically abused: Not on file    Forced sexual activity: Not on file  Other Topics Concern  . Not on file  Social History Narrative  . Not on file    Family History  Problem Relation Age of Onset  . Thyroid disease Mother   . Diabetes Maternal Aunt   . Diabetes Maternal Grandmother   . Diabetes Maternal Grandfather   . Thyroid disease Unknown     Past Surgical History:  Procedure Laterality Date  . NO PAST SURGERIES      ROS: Review of Systems Negative except as stated above PHYSICAL EXAM: BP 120/74   Pulse 71   Temp 97.9 F (36.6 C) (Oral)   Resp 16   Wt 205 lb 6.4 oz (93.2 kg)   SpO2 98%   BMI 27.86 kg/m   Physical Exam General appearance - alert, well appearing, and in no distress Mental status - normal mood, behavior, speech, dress, motor activity, and thought processes Mouth - mucous membranes moist, pharynx normal without lesions Chest - clear to auscultation, no wheezes, rales or rhonchi, symmetric air entry Heart - normal rate, regular rhythm, normal S1, S2, no murmurs, rubs, clicks or gallops Abdomen - soft, nontender, nondistended, no masses or organomegaly    Chemistry      Component Value Date/Time   NA 134 (L) 06/29/2018 1424   K 4.0 06/29/2018 1424   CL 99 06/29/2018 1424   CO2 23 06/29/2018 1424   BUN 9 06/29/2018 1424   CREATININE 0.91 06/29/2018 1424   CREATININE 0.91 04/18/2016 1323      Component Value Date/Time   CALCIUM 9.5 06/29/2018 1424   ALKPHOS 126 06/29/2018 1424   AST 17 06/29/2018 1424   ALT 27 06/29/2018 1424   BILITOT 0.8 06/29/2018 1424     Lab Results  Component Value Date   WBC 5.6 06/29/2018   HGB 15.3 06/29/2018   HCT 47.1 06/29/2018   MCV 81.2 06/29/2018   PLT 289 06/29/2018   Results for orders placed or performed in visit on 07/02/18  POCT glucose (manual entry)  Result Value Ref Range   POC Glucose 432 (A) 70 - 99 mg/dl  POCT URINALYSIS DIP (CLINITEK)  Result Value Ref Range   Color,  UA yellow yellow   Clarity, UA clear clear   Glucose, UA =500 (A) negative mg/dL   Bilirubin,  UA negative negative   Ketones, POC UA negative negative mg/dL   Spec Grav, UA 1.015 1.010 - 1.025   Blood, UA negative negative   pH, UA 6.0 5.0 - 8.0   POC Protein UA Negative Negative, Trace   Urobilinogen, UA 0.2 0.2 or 1.0 E.U./dL   Nitrite, UA Negative Negative   Leukocytes, UA Negative Negative  POCT glucose (manual entry)  Result Value Ref Range   POC Glucose 420 (A) 70 - 99 mg/dl     ASSESSMENT AND PLAN: 1. Gastroparesis Possible gastroparesis.  I stressed the importance of good diabetes control and preventing complications of diabetes.  We will put him in for gastric emptying study.  Patient advised that he should not take Reglan for at least a week prior to the study.  I went over the possible side effects of Reglan including movement disorders.  Advised him to use it only when needed to - [place]; Future  2. Uncontrolled type 1 diabetes mellitus with hyperglycemia (HCC) Uncontrolled.  Stressed the importance of bringing her blood sugar reading log with him or his glucometer so that we are not blindly trying to adjust his insulin.  I think his blood sugars are running higher than what he stated above and it may be that he is not checking consistently to be able to give an accurate account.  For now we increase Lantus to 15 units.  I have asked him to follow-up with our clinical pharmacist in 1 month and to bring a log with his blood sugar readings with him so that we can make informed decisions about adjusting his insulin. -Healthy eating habits discussed. - POCT glucose (manual entry) - POCT URINALYSIS DIP (CLINITEK) - insulin aspart (novoLOG) injection 10 Units - Ambulatory referral to Endocrinology  3. Influenza vaccination declined Patient declined  4. Pneumococcal vaccination declined Patient declined  5. History of genital warts Refill on Aldara prescription  given to him    Patient was given the opportunity to ask questions.  Patient verbalized understanding of the plan and was able to repeat key elements of the plan.   Orders Placed This Encounter  Procedures  . NM GASTRIC EMPTYING  . Ambulatory referral to Endocrinology  . POCT glucose (manual entry)  . POCT URINALYSIS DIP (CLINITEK)  . POCT glucose (manual entry)     Requested Prescriptions   Signed Prescriptions Disp Refills  . imiquimod (ALDARA) 5 % cream 12 each 0    Sig: Apply a thin layer 3 times per week (on alternate days) prior to bedtime; leave on skin for 6 to 10 hours, then remove with mild soap and water. Continue until there is total clearance of the genital/perianal warts or for a maximum duration of therapy of 16 weeks  . Insulin Glargine (LANTUS SOLOSTAR) 100 UNIT/ML Solostar Pen 5 pen 11    Sig: Inject 15 Units into the skin daily at 10 pm.    Return in about 2 months (around 09/01/2018).  Karle Plumber, MD, FACP

## 2018-07-16 ENCOUNTER — Encounter (HOSPITAL_COMMUNITY)
Admission: RE | Admit: 2018-07-16 | Discharge: 2018-07-16 | Disposition: A | Discharge status: Home / Self Care | Payer: Medicaid Other | Source: Ambulatory Visit | Attending: Internal Medicine | Admitting: Internal Medicine

## 2018-07-16 DIAGNOSIS — K3184 Gastroparesis: Secondary | ICD-10-CM | POA: Insufficient documentation

## 2018-07-16 MED ORDER — TECHNETIUM TC 99M SULFUR COLLOID
2.0000 | Freq: Once | INTRAVENOUS | Status: AC | PRN
  Administered 2018-07-16: 2 via ORAL

## 2018-07-21 ENCOUNTER — Telehealth: Payer: Self-pay

## 2018-07-21 NOTE — Telephone Encounter (Signed)
Contacted pt to go over gastric emptying pt is aware and doesn't have any questions or concerns

## 2018-08-07 ENCOUNTER — Emergency Department (HOSPITAL_COMMUNITY)
Admission: EM | Admit: 2018-08-07 | Discharge: 2018-08-07 | Disposition: A | Discharge status: Home / Self Care | Payer: Medicaid Other | Attending: Emergency Medicine | Admitting: Emergency Medicine

## 2018-08-07 ENCOUNTER — Other Ambulatory Visit: Payer: Self-pay

## 2018-08-07 ENCOUNTER — Encounter (HOSPITAL_COMMUNITY): Payer: Self-pay | Admitting: *Deleted

## 2018-08-07 DIAGNOSIS — M5431 Sciatica, right side: Secondary | ICD-10-CM | POA: Insufficient documentation

## 2018-08-07 DIAGNOSIS — E109 Type 1 diabetes mellitus without complications: Secondary | ICD-10-CM | POA: Diagnosis not present

## 2018-08-07 DIAGNOSIS — F17210 Nicotine dependence, cigarettes, uncomplicated: Secondary | ICD-10-CM | POA: Diagnosis not present

## 2018-08-07 DIAGNOSIS — Z794 Long term (current) use of insulin: Secondary | ICD-10-CM | POA: Diagnosis not present

## 2018-08-07 DIAGNOSIS — Z79899 Other long term (current) drug therapy: Secondary | ICD-10-CM | POA: Diagnosis not present

## 2018-08-07 DIAGNOSIS — M545 Low back pain: Secondary | ICD-10-CM | POA: Diagnosis present

## 2018-08-07 MED ORDER — TRAMADOL HCL 50 MG PO TABS: 50.0000 mg | ORAL_TABLET | Freq: Four times a day (QID) | ORAL | 0 refills | Status: DC | PRN

## 2018-08-07 MED ORDER — METHOCARBAMOL 500 MG PO TABS: 500.0000 mg | ORAL_TABLET | Freq: Two times a day (BID) | ORAL | 0 refills | Status: DC

## 2018-08-07 MED ORDER — NAPROXEN 500 MG PO TABS: 500.0000 mg | ORAL_TABLET | Freq: Two times a day (BID) | ORAL | 0 refills | Status: DC

## 2018-08-07 MED ORDER — KETOROLAC TROMETHAMINE 30 MG/ML IJ SOLN
30.0000 mg | Freq: Once | INTRAMUSCULAR | Status: AC
  Administered 2018-08-07: 30 mg via INTRAMUSCULAR
  Filled 2018-08-07: qty 1

## 2018-08-07 NOTE — ED Provider Notes (Signed)
[facility]PART[facility] Provider Note   CSN: [id number] Arrival date & time: 08/07/18  1829     History   Chief Complaint Chief Complaint  Patient presents with  . Back Pain    HPI Jonathan Porter is a 27 y.o. male with a past medical history of insulin-dependent type 1 diabetes who presents to ED for 1 week history of right lower back pain.  Describes the pain as sharp, shooting radiating down his leg with associated intermittent paresthesias.  He denies any loss of sensation in groin, loss of bowel or bladder control.  He states that symptoms could have flared up from his job.  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  He has taken Tylenol with no improvement in his symptoms.  He does admit that he has had intermittent symptoms in the past that flareup from time to time.  He denies any injuries or falls, prior back surgeries, history of cancer, history of IV drug use, urinary symptoms, fever, chest pain, shortness of breath, abdominal pain.  HPI  Past Medical History:  Diagnosis Date  . Diabetes mellitus without complication [facility])     Patient Active Problem List   Diagnosis Date Noted  . Paronychia of finger of left hand 01/20/2017  . 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 Diabetes type 1, uncontrolled ([place]) 08/23/2015  . Onychomycosis of toenail 08/23/2015  . Phimosis 08/07/2014    Past Surgical History:  Procedure Laterality Date  . NO PAST SURGERIES          Home Medications    Prior to Admission medications   Medication Sig Start Date End Date Taking? Authorizing Provider  Blood Glucose Monitoring Suppl (TRUE METRIX METER) w/Device KIT 1 each by Does not apply route as needed. 05/10/18   Maczis, Barth Kirks, PA-C  glucose blood (TRUE METRIX BLOOD GLUCOSE TEST) test strip 1 each by Other route 3 (three) times daily. 08/23/15   Funches, Adriana Mccallum, MD  imiquimod (ALDARA) 5  % cream Apply a thin layer 3 times per week (on alternate days) prior to bedtime; leave on skin for 6 to 10 hours, then remove with mild soap and water. Continue until there is total clearance of the genital/perianal warts or for a maximum duration of therapy of 16 weeks 07/02/18   Ladell Pier, MD  insulin aspart (NOVOLOG) 100 UNIT/ML FlexPen Inject 10 Units into the skin 3 (three) times daily with meals. 05/21/18   Ladell Pier, MD  Insulin Glargine (LANTUS SOLOSTAR) 100 UNIT/ML Solostar Pen Inject 15 Units into the skin daily at 10 pm. 07/02/18   Ladell Pier, MD  Insulin Pen Needle (B-D ULTRAFINE III SHORT PEN) 31G X 8 MM MISC 1 application by Does not apply route 4 (four) times daily. 05/21/18   Ladell Pier, MD  methocarbamol (ROBAXIN) 500 MG tablet Take 1 tablet (500 mg total) by mouth 2 (two) times daily. 08/07/18   Matson Welch, PA-C  metoCLOPramide (REGLAN) 10 MG tablet Take 1 tablet (10 mg total) by mouth every 6 (six) hours. 06/29/18   Keenan Bachelor, MD  naproxen (NAPROSYN) 500 MG tablet Take 1 tablet (500 mg total) by mouth 2 (two) times daily. 08/07/18   Kaliel Bolds, PA-C  traMADol (ULTRAM) 50 MG tablet Take 1 tablet (50 mg total) by mouth every 6 (six) hours as needed. 08/07/18   Balin Vandegrift, Nicanor Alcon, PA-C  TRUEPLUS LANCETS  28G MISC 1 each by Does not apply route 3 (three) times daily. 08/23/15   Boykin Nearing, MD    Family History Family History  Problem Relation Age of Onset  . Thyroid disease Mother   . Diabetes Maternal Aunt   . Diabetes Maternal Grandmother   . Diabetes Maternal Grandfather   . Thyroid disease Unknown     Social History Social History   Tobacco Use  . Smoking status: Current Every Day Smoker    Packs/day: 0.25    Years: 6.00    Pack years: 1.50    Types: Cigarettes  . Smokeless tobacco: Never Used  Substance Use Topics  . Alcohol use: Yes    Comment: social   . Drug use: Yes    Types: Marijuana    Comment: last used 04/17/2016       Allergies   Shellfish allergy   Review of Systems Review of Systems  Constitutional: Negative for chills and fever.  Musculoskeletal: Positive for back pain and myalgias.  Neurological: Negative for weakness and numbness.     Physical Exam Updated Vital Signs BP (!) 135/93 (BP Location: Right Arm)   Pulse (!) 103   Temp 97.8 F (36.6 C) (Oral)   Resp 12   Ht 6' (1.829 m)   Wt 93 kg   SpO2 100%   BMI 27.80 kg/m   Physical Exam  Constitutional: He appears well-developed and well-nourished. No distress.  HENT:  Head: Normocephalic and atraumatic.  Eyes: Conjunctivae and EOM are normal. No scleral icterus.  Neck: Normal range of motion.  Cardiovascular: Normal rate, regular rhythm and normal heart sounds.  Pulmonary/Chest: Effort normal and breath sounds normal. No respiratory distress.  Abdominal: There is no tenderness.  Musculoskeletal: Normal range of motion. He exhibits tenderness. He exhibits no edema or deformity.       Back:  No midline spinal tenderness present in lumbar, thoracic or cervical spine. No step-off palpated. No visible bruising, edema or temperature change noted. No objective signs of numbness present. No saddle anesthesia. 2+ DP pulses bilaterally. Sensation intact to light touch. Strength 5/5 in bilateral lower extremities.  Neurological: He is alert.  Skin: No rash noted. He is not diaphoretic.  Psychiatric: He has a normal mood and affect.  Nursing note and vitals reviewed.    ED Treatments / Results  Labs (all labs ordered are listed, but only abnormal results are displayed) Labs Reviewed - No data to display  EKG None  Radiology No results found.  Procedures Procedures (including critical care time)  Medications Ordered in ED Medications - No data to display   Initial Impression / Assessment and Plan / ED Course  I have reviewed the triage vital signs and the nursing notes.  Pertinent labs & imaging results that were  available during my care of the patient were reviewed by me and considered in my medical decision making (see chart for details).     27 year old male presents to ED for 1 week history of right lower back pain.  Describes the pain as sharp, shooting all the way down his leg.  He is concerned that he may have a pinched nerve.  He does admit that this is happened to him several times in the past intermittently.  Unsure of what caused this episode but states that he is constantly walking and on his feet for work.  On exam patient is ambulatory with no deficits on neurological exam of lower extremities noted.  He is afebrile.  Mild tachycardia could be secondary to pain. Patient denies any concerning symptoms suggestive of cauda equina requiring urgent imaging at this time such as loss of sensation in the lower extremities, lower extremity weakness, loss of bowel or bladder control, saddle anesthesia, urinary retention, fever/chills, IVDU. Exam demonstrated no  weakness on exam today. No preceding injury or trauma to suggest acute fracture. Doubt pelvic or urinary pathology for patient's acute back pain, as patient denies urinary symptoms. Doubt AAA as cause of patient's back pain as patient lacks major risk factors, had no abdominal TTP, and has symmetric and intact distal pulses.  Suspect that symptoms are due to sciatica.  Patient is diabetic, insulin-dependent so will defer steroids to PCP if necessary.  Will give short course of pain medication, anti-inflammatories and muscle relaxer.  Patient given strict return precautions for any symptoms indicating worsening neurologic function in the lower extremities. [place] PMP queried with no discrepancies.   Evaluation does not show pathology that would require ongoing emergent intervention or inpatient treatment. I explained the diagnosis to the patient. Pain has been managed and has no complaints prior to discharge. Patient is comfortable with above plan and is  stable for discharge at this time. All questions were answered prior to disposition. Strict return precautions for returning to the ED were discussed. Encouraged follow up with PCP.    Portions of this note were generated with [employer]. Dictation errors may occur despite best attempts at proofreading.   Final Clinical Impressions(s) / ED Diagnoses   Final diagnoses:  Sciatica of right side    ED Discharge Orders         Ordered    methocarbamol (ROBAXIN) 500 MG tablet  2 times daily     08/07/18 0945    traMADol (ULTRAM) 50 MG tablet  Every 6 hours PRN     08/07/18 0945    naproxen (NAPROSYN) 500 MG tablet  2 times daily     08/07/18 0945           Delia Heady, PA-C 08/07/18 1610    Orlie Dakin, MD 08/07/18 [phone]

## 2018-08-07 NOTE — Discharge Instructions (Signed)
Return to ED for worsening symptoms, injuries or falls, losing control of your bowels or bladder, fever, chest pain or shortness of breath.

## 2018-08-07 NOTE — ED Triage Notes (Signed)
C/i back pian onset 1 week ago , thought he had pulled a muscle ,states the pain is shooting down right buttocks into right leg. States lower leg feels numb. Hurts to sit down

## 2018-08-25 ENCOUNTER — Encounter: Payer: Self-pay | Admitting: Nurse Practitioner

## 2018-08-25 ENCOUNTER — Ambulatory Visit: Payer: Medicaid Other | Attending: Nurse Practitioner | Admitting: Nurse Practitioner

## 2018-08-25 VITALS — BP 117/78 | HR 83 | Temp 97.8°F | Resp 16 | Wt 218.0 lb

## 2018-08-25 DIAGNOSIS — R252 Cramp and spasm: Secondary | ICD-10-CM

## 2018-08-25 DIAGNOSIS — Z91013 Allergy to seafood: Secondary | ICD-10-CM | POA: Diagnosis not present

## 2018-08-25 DIAGNOSIS — E1065 Type 1 diabetes mellitus with hyperglycemia: Secondary | ICD-10-CM | POA: Diagnosis not present

## 2018-08-25 DIAGNOSIS — M5431 Sciatica, right side: Secondary | ICD-10-CM | POA: Diagnosis present

## 2018-08-25 DIAGNOSIS — Z833 Family history of diabetes mellitus: Secondary | ICD-10-CM | POA: Diagnosis not present

## 2018-08-25 DIAGNOSIS — Z794 Long term (current) use of insulin: Secondary | ICD-10-CM | POA: Diagnosis not present

## 2018-08-25 DIAGNOSIS — Z79899 Other long term (current) drug therapy: Secondary | ICD-10-CM | POA: Diagnosis not present

## 2018-08-25 DIAGNOSIS — E1040 Type 1 diabetes mellitus with diabetic neuropathy, unspecified: Secondary | ICD-10-CM | POA: Diagnosis not present

## 2018-08-25 DIAGNOSIS — M5441 Lumbago with sciatica, right side: Secondary | ICD-10-CM | POA: Diagnosis not present

## 2018-08-25 DIAGNOSIS — Z791 Long term (current) use of non-steroidal anti-inflammatories (NSAID): Secondary | ICD-10-CM | POA: Insufficient documentation

## 2018-08-25 DIAGNOSIS — IMO0002 Reserved for concepts with insufficient information to code with codable children: Secondary | ICD-10-CM

## 2018-08-25 DIAGNOSIS — M5440 Lumbago with sciatica, unspecified side: Secondary | ICD-10-CM

## 2018-08-25 DIAGNOSIS — R269 Unspecified abnormalities of gait and mobility: Secondary | ICD-10-CM | POA: Diagnosis not present

## 2018-08-25 LAB — GLUCOSE, POCT (MANUAL RESULT ENTRY): POC GLUCOSE: 102 mg/dL — AB (ref 70–99)

## 2018-08-25 MED ORDER — GABAPENTIN 100 MG PO CAPS: 100.0000 mg | ORAL_CAPSULE | Freq: Three times a day (TID) | ORAL | 0 refills | Status: DC

## 2018-08-25 MED ORDER — METHOCARBAMOL 500 MG PO TABS: 500.0000 mg | ORAL_TABLET | Freq: Three times a day (TID) | ORAL | 0 refills | Status: AC

## 2018-08-25 MED ORDER — METHOCARBAMOL 500 MG PO TABS: 500.0000 mg | ORAL_TABLET | Freq: Three times a day (TID) | ORAL | 0 refills | Status: DC

## 2018-08-25 NOTE — Progress Notes (Signed)
Pain in back and Rt. Leg Intermittent pain , denies pain presently  Refill on medications

## 2018-08-25 NOTE — Progress Notes (Signed)
Assessment & Plan:  Jonathan Porter was seen today for hospitalization follow-up.  Diagnoses and all orders for this visit:  Back pain of lumbosacral region with sciatica -     DG Lumbar Spine Complete; Future -     methocarbamol (ROBAXIN) 500 MG tablet; Take 1 tablet (500 mg total) by mouth 3 (three) times daily for 14 days. -     gabapentin (NEURONTIN) 100 MG capsule; Take 1 capsule (100 mg total) by mouth 3 (three) times daily. Work on losing weight to help reduce back pain. May alternate with heat and ice application for pain relief. May also alternate with acetaminophen and Ibuprofen as prescribed for back pain. Other alternatives include massage, acupuncture and water aerobics.  You must stay active and avoid a sedentary lifestyle.    Cramps of right lower extremity -     CMP14+EGFR  Uncontrolled type 1 diabetes mellitus with diabetic neuropathy, with long-term current use of insulin (HCC) -     Glucose (CBG) Diabetes is poorly controlled. Advised patient to keep a fasting blood sugar log fast, 2 hours post lunch and bedtime which will be reviewed at the next office visit.    Patient has been counseled on age-appropriate routine health concerns for screening and prevention. These are reviewed and up-to-date. Referrals have been placed accordingly. Immunizations are up-to-date or declined.    Subjective:   Chief Complaint  Patient presents with  . Hospitalization Follow-up   HPI Jonathan Porter 27 y.o. male presents to office today for hospital follow up to sciatica.    Sciatica He was seen in the ED on 08-07-2018 with complaints of right lower back pain with sciatica to the right leg. Onset of back pain 1 week prior to ED visit.  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 He was given toradol IM and prescribed robaxin 546m BID and tramadol. He states robaxin helped to relieve some of his pain but the  tramadol did not. No imaging was performed in the ED prior to discharge.   Today he states his sciatic has been present on and off for several months. He denies any involuntary loss of bowel or bladder. He does endorses sharp pain in the lower right lumbar area radiating down the leg with cramping in the right calf and numbness of his right 4th and 5th toe. He states the numbness affects his gait at times. He has type 1 DM and last A1c was 10.9. He may also have peripheral neuropathy which I will treat today with gabapentin and dc the tramadol. He has an upcoming appointment with his PCP next week and DM will be discussed at that time.     Review of Systems  Constitutional: Negative for fever, malaise/fatigue and weight loss.  HENT: Negative.  Negative for nosebleeds.   Eyes: Negative.  Negative for blurred vision, double vision and photophobia.  Respiratory: Negative.  Negative for cough and shortness of breath.   Cardiovascular: Negative.  Negative for chest pain, palpitations and leg swelling.  Gastrointestinal: Negative.  Negative for heartburn, nausea and vomiting.  Musculoskeletal: Positive for back pain. Negative for falls and myalgias.  Neurological: Positive for sensory change. Negative for dizziness, tingling, tremors, focal weakness, seizures and headaches.  Psychiatric/Behavioral: Negative.  Negative for suicidal ideas.    Past Medical History:  Diagnosis Date  . Diabetes mellitus without complication ([facility]     Past Surgical History:  Procedure Laterality Date  .  NO PAST SURGERIES      Family History  Problem Relation Age of Onset  . Thyroid disease Mother   . Diabetes Maternal Aunt   . Diabetes Maternal Grandmother   . Diabetes Maternal Grandfather   . Thyroid disease Unknown     Social History Reviewed with no changes to be made today.   Outpatient Medications Prior to Visit  Medication Sig Dispense Refill  . Blood Glucose Monitoring Suppl (TRUE METRIX METER)  w/Device KIT 1 each by Does not apply route as needed. 1 kit 0  . glucose blood (TRUE METRIX BLOOD GLUCOSE TEST) test strip 1 each by Other route 3 (three) times daily. 100 each 11  . imiquimod (ALDARA) 5 % cream Apply a thin layer 3 times per week (on alternate days) prior to bedtime; leave on skin for 6 to 10 hours, then remove with mild soap and water. Continue until there is total clearance of the genital/perianal warts or for a maximum duration of therapy of 16 weeks 12 each 0  . insulin aspart (NOVOLOG) 100 UNIT/ML FlexPen Inject 10 Units into the skin 3 (three) times daily with meals. 15 mL 11  . Insulin Glargine (LANTUS SOLOSTAR) 100 UNIT/ML Solostar Pen Inject 15 Units into the skin daily at 10 pm. 5 pen 11  . metoCLOPramide (REGLAN) 10 MG tablet Take 1 tablet (10 mg total) by mouth every 6 (six) hours. 30 tablet 0  . naproxen (NAPROSYN) 500 MG tablet Take 1 tablet (500 mg total) by mouth 2 (two) times daily. 30 tablet 0  . TRUEPLUS LANCETS 28G MISC 1 each by Does not apply route 3 (three) times daily. 100 each 11  . traMADol (ULTRAM) 50 MG tablet Take 1 tablet (50 mg total) by mouth every 6 (six) hours as needed. 8 tablet 0  . Insulin Pen Needle (B-D ULTRAFINE III SHORT PEN) 31G X 8 MM MISC 1 application by Does not apply route 4 (four) times daily. 120 each 11  . methocarbamol (ROBAXIN) 500 MG tablet Take 1 tablet (500 mg total) by mouth 2 (two) times daily. 20 tablet 0   No facility-administered medications prior to visit.     Allergies  Allergen Reactions  . Shellfish Allergy Anaphylaxis       Objective:    BP 117/78   Pulse 83   Temp 97.8 F (36.6 C) (Oral)   Resp 16   Wt 218 lb (98.9 kg)   SpO2 98%   BMI 29.57 kg/m  Wt Readings from Last 3 Encounters:  08/25/18 218 lb (98.9 kg)  08/07/18 205 lb (93 kg)  07/02/18 205 lb 6.4 oz (93.2 kg)    Physical Exam  Constitutional: He is oriented to person, place, and time. He appears well-developed and well-nourished. He is  cooperative.  HENT:  Head: Normocephalic and atraumatic.  Eyes: EOM are normal.  Neck: Normal range of motion.  Cardiovascular: Normal rate, regular rhythm, normal heart sounds and intact distal pulses. Exam reveals no gallop and no friction rub.  No murmur heard. Pulmonary/Chest: Effort normal and breath sounds normal. No tachypnea. No respiratory distress. He has no decreased breath sounds. He has no wheezes. He has no rhonchi. He has no rales. He exhibits no tenderness.  Abdominal: Soft. Bowel sounds are normal.  Musculoskeletal: Normal range of motion. He exhibits no edema or deformity.       Lumbar back: He exhibits normal range of motion, no tenderness, no swelling, no edema, no deformity and no pain.  Neurological: He is alert and oriented to person, place, and time. He has normal strength. No sensory deficit. Coordination normal.  Skin: Skin is warm and dry.  Psychiatric: He has a normal mood and affect. His behavior is normal. Judgment and thought content normal.  Nursing note and vitals reviewed.       Patient has been counseled extensively about nutrition and exercise as well as the importance of adherence with medications and regular follow-up. The patient was given clear instructions to go to ER or return to medical center if symptoms don't improve, worsen or new problems develop. The patient verbalized understanding.   Follow-up: Return for follow up with Dr. Wynetta Emery on 12-12 (already scheduled).   Gildardo Pounds, FNP-BC [facility] and [facility] [place], [place]   08/25/2018, 9:05 PM

## 2018-08-25 NOTE — Patient Instructions (Signed)
Sciatica Sciatica is pain, numbness, weakness, or tingling along your sciatic nerve. The sciatic nerve starts in the lower back and goes down the back of each leg. Sciatica happens when this nerve is pinched or has pressure put on it. Sciatica usually goes away on its own or with treatment. Sometimes, sciatica may keep coming back (recur). Follow these instructions at home: Medicines  Take over-the-counter and prescription medicines only as told by your doctor.  Do not drive or use heavy machinery while taking prescription pain medicine. Managing pain  If directed, put ice on the affected area. ? Put ice in a plastic bag. ? Place a towel between your skin and the bag. ? Leave the ice on for 20 minutes, 2-3 times a day.  After icing, apply heat to the affected area before you exercise or as often as told by your doctor. Use the heat source that your doctor tells you to use, such as a moist heat pack or a heating pad. ? Place a towel between your skin and the heat source. ? Leave the heat on for 20-30 minutes. ? Remove the heat if your skin turns bright red. This is especially important if you are unable to feel pain, heat, or cold. You may have a greater risk of getting burned. Activity  Return to your normal activities as told by your doctor. Ask your doctor what activities are safe for you. ? Avoid activities that make your sciatica worse.  Take short rests during the day. Rest in a lying or standing position. This is usually better than sitting to rest. ? When you rest for a long time, do some physical activity or stretching between periods of rest. ? Avoid sitting for a long time without moving. Get up and move around at least one time each hour.  Exercise and stretch regularly, as told by your doctor.  Do not lift anything that is heavier than 10 lb (4.5 kg) while you have symptoms of sciatica. ? Avoid lifting heavy things even when you do not have symptoms. ? Avoid lifting heavy  things over and over.  When you lift objects, always lift in a way that is safe for your body. To do this, you should: ? Bend your knees. ? Keep the object close to your body. ? Avoid twisting. General instructions  Use good posture. ? Avoid leaning forward when you are sitting. ? Avoid hunching over when you are standing.  Stay at a healthy weight.  Wear comfortable shoes that support your feet. Avoid wearing high heels.  Avoid sleeping on a mattress that is too soft or too hard. You might have less pain if you sleep on a mattress that is firm enough to support your back.  Keep all follow-up visits as told by your doctor. This is important. Contact a doctor if:  You have pain that: ? Wakes you up when you are sleeping. ? Gets worse when you lie down. ? Is worse than the pain you have had in the past. ? Lasts longer than 4 weeks.  You lose weight for without trying. Get help right away if:  You cannot control when you pee (urinate) or poop (have a bowel movement).  You have weakness in any of these areas and it gets worse. ? Lower back. ? Lower belly (pelvis). ? Butt (buttocks). ? Legs.  You have redness or swelling of your back.  You have a burning feeling when you pee. This information is not intended to replace   advice given to you by your health care provider. Make sure you discuss any questions you have with your health care provider. Document Released: 06/17/2008 Document Revised: 02/14/2016 Document Reviewed: 05/18/2015 Elsevier Interactive Patient Education  2018 Elsevier Inc[employer]  

## 2018-09-02 ENCOUNTER — Ambulatory Visit: Payer: Medicaid Other | Admitting: Internal Medicine

## 2018-09-13 MED FILL — LANTUS SOLOSTAR 100 UNITS/M: 100 | 34 days supply | Qty: 15 | Fill #1

## 2018-09-13 MED FILL — NOVOLOG FLEXPEN SYRINGE: 100 | 34 days supply | Qty: 15 | Fill #1

## 2018-09-28 ENCOUNTER — Other Ambulatory Visit: Payer: Self-pay | Admitting: Pharmacist

## 2018-09-28 ENCOUNTER — Ambulatory Visit: Payer: Medicaid Other | Attending: Internal Medicine | Admitting: Internal Medicine

## 2018-09-28 ENCOUNTER — Encounter: Payer: Self-pay | Admitting: Internal Medicine

## 2018-09-28 VITALS — BP 127/84 | HR 78 | Temp 98.1°F | Resp 16 | Ht 72.0 in | Wt 227.4 lb

## 2018-09-28 DIAGNOSIS — R109 Unspecified abdominal pain: Secondary | ICD-10-CM | POA: Diagnosis not present

## 2018-09-28 DIAGNOSIS — E1040 Type 1 diabetes mellitus with diabetic neuropathy, unspecified: Secondary | ICD-10-CM

## 2018-09-28 DIAGNOSIS — G8929 Other chronic pain: Secondary | ICD-10-CM

## 2018-09-28 DIAGNOSIS — N529 Male erectile dysfunction, unspecified: Secondary | ICD-10-CM | POA: Insufficient documentation

## 2018-09-28 DIAGNOSIS — Z79899 Other long term (current) drug therapy: Secondary | ICD-10-CM | POA: Diagnosis not present

## 2018-09-28 DIAGNOSIS — F17210 Nicotine dependence, cigarettes, uncomplicated: Secondary | ICD-10-CM | POA: Diagnosis not present

## 2018-09-28 DIAGNOSIS — R110 Nausea: Secondary | ICD-10-CM | POA: Insufficient documentation

## 2018-09-28 DIAGNOSIS — Z833 Family history of diabetes mellitus: Secondary | ICD-10-CM | POA: Diagnosis not present

## 2018-09-28 DIAGNOSIS — E1065 Type 1 diabetes mellitus with hyperglycemia: Secondary | ICD-10-CM

## 2018-09-28 DIAGNOSIS — Z794 Long term (current) use of insulin: Secondary | ICD-10-CM | POA: Diagnosis not present

## 2018-09-28 DIAGNOSIS — E10649 Type 1 diabetes mellitus with hypoglycemia without coma: Secondary | ICD-10-CM | POA: Insufficient documentation

## 2018-09-28 DIAGNOSIS — IMO0001 Reserved for inherently not codable concepts without codable children: Secondary | ICD-10-CM

## 2018-09-28 DIAGNOSIS — F17200 Nicotine dependence, unspecified, uncomplicated: Secondary | ICD-10-CM

## 2018-09-28 LAB — POCT GLYCOSYLATED HEMOGLOBIN (HGB A1C): HbA1c, POC (controlled diabetic range): 7.9 % — AB (ref 0.0–7.0)

## 2018-09-28 LAB — GLUCOSE, POCT (MANUAL RESULT ENTRY)
POC GLUCOSE: 97 mg/dL (ref 70–99)
POC Glucose: 65 mg/dl — AB (ref 70–99)

## 2018-09-28 MED ORDER — ACCU-CHEK GUIDE ME W/DEVICE KIT: 1.0000 | PACK | Freq: Three times a day (TID) | 0 refills | Status: DC

## 2018-09-28 MED ORDER — SILDENAFIL CITRATE 50 MG PO TABS: ORAL_TABLET | ORAL | 1 refills | Status: DC

## 2018-09-28 MED ORDER — INSULIN ASPART 100 UNIT/ML FLEXPEN: 15.0000 [IU] | PEN_INJECTOR | Freq: Three times a day (TID) | SUBCUTANEOUS | 12 refills | Status: DC

## 2018-09-28 MED ORDER — NICOTINE 14 MG/24HR TD PT24: 14.0000 mg | MEDICATED_PATCH | Freq: Every day | TRANSDERMAL | 0 refills | Status: DC

## 2018-09-28 MED ORDER — ACCU-CHEK FASTCLIX LANCETS MISC: 11 refills | Status: DC

## 2018-09-28 MED ORDER — GLUCOSE BLOOD VI STRP: 1.0000 | ORAL_STRIP | Freq: Three times a day (TID) | 12 refills | Status: DC

## 2018-09-28 MED ORDER — INSTA-GLUCOSE 77.4 % PO GEL
1.0000 | Freq: Once | ORAL | Status: AC
  Administered 2018-09-28: 1 via ORAL

## 2018-09-28 MED ORDER — INSULIN GLARGINE 100 UNIT/ML SOLOSTAR PEN: 15.0000 [IU] | PEN_INJECTOR | Freq: Every day | SUBCUTANEOUS | 12 refills | Status: DC

## 2018-09-28 MED FILL — SM NICOTINE 14 MG/24HR PATC: 14 | 28 days supply | Qty: 28 | Fill #0

## 2018-09-28 MED FILL — ACCU-CHEK GUIDE TEST STRIP: 30 days supply | Qty: 100 | Fill #0

## 2018-09-28 MED FILL — ACCU-CHEK FASTCLIX LANCETS: 30 days supply | Qty: 102 | Fill #0

## 2018-09-28 MED FILL — ACCU-CHEK GUIDE W/DEVICE KI: W/DEVICE | 30 days supply | Qty: 1 | Fill #0

## 2018-09-28 NOTE — Progress Notes (Signed)
Patient ID: Jonathan QuamLarry J Malta, male    DOB: 10-31-1990  MRN: [id number]  CC: Diabetes   Subjective: Sherre PootLarry Brier is a 28 y.o. male who presents for chronic ds management His concerns today include:  Patient with history of insulin-dependent diabetes, genital warts, tob dep.  Saw NP last mth with sciatica.  Patient reports that his back is much better but he has some residual tingling and numbness on the lateral aspect of the right foot.  Initially the entire foot was numb.  He was started on gabapentin by the nurse practitioner possible diabetic peripheral neuropathy but he has not been taking it.    DM:  Just ran out of stripes 2 days ago. Checks blood sugars a few times a day.  He reports that he has had wide swings in blood sugars recently between 200-300s.  He attributes this to not eating regularly.  Reports that he still get episodes where "my stomach acts up" and he is unable to eat.  During these episodes he gets cramping in the abdomen with nausea.  He has some Reglan at home but has not used it recently.  Gastric emptying study was normal.  He has had a normal gallbladder ultrasound in the past. -Reports taking NovoLog 15 units 3-4 times a day when he eats and if he eats, and Lantus 15 units daily. -Occasional hypoglycemia.  He was hypoglycemic today when he came in with blood sugar in the 60s.  Patient states that he did not eat anything much today because his stomach was acting up again. -Reports that he has cut back significantly on consuming sugary drinks.  Planes of problem getting maintaining erection since summer of last year.  Admits that he sometimes gets some performance anxiety.   Tob dep: He was smoking half a pack of cigarettes a day but made a New Year's resolution to quit.  He states that he has not purchased a pack since the beginning of this year.  However he admits to cravings and in to decrease the cravings, he started smoking a cigar a day.    Patient Active  Problem List   Diagnosis Date Noted  . Paronychia of finger of left hand 01/20/2017  . 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 Diabetes type 1, uncontrolled (HCC) 08/23/2015  . Onychomycosis of toenail 08/23/2015  . Phimosis 08/07/2014     Current Outpatient Medications on File Prior to Visit  Medication Sig Dispense Refill  . gabapentin (NEURONTIN) 100 MG capsule Take 1 capsule (100 mg total) by mouth 3 (three) times daily. (Patient not taking: Reported on 09/28/2018) 90 capsule 0  . imiquimod (ALDARA) 5 % cream Apply a thin layer 3 times per week (on alternate days) prior to bedtime; leave on skin for 6 to 10 hours, then remove with mild soap and water. Continue until there is total clearance of the genital/perianal warts or for a maximum duration of therapy of 16 weeks 12 each 0  . Insulin Pen Needle (B-D ULTRAFINE III SHORT PEN) 31G X 8 MM MISC 1 application by Does not apply route 4 (four) times daily. 120 each 11  . metoCLOPramide (REGLAN) 10 MG tablet Take 1 tablet (10 mg total) by mouth every 6 (six) hours. (Patient not taking: Reported on 09/28/2018) 30 tablet 0   No current facility-administered medications on file prior to visit.     Allergies  Allergen Reactions  . Shellfish Allergy Anaphylaxis    Social History  Socioeconomic History  . Marital status: Single    Spouse name: Not on file  . Number of children: Not on file  . Years of education: Not on file  . Highest education level: Not on file  Occupational History  . Not on file  Social Needs  . Financial resource strain: Not on file  . Food insecurity:    Worry: Not on file    Inability: Not on file  . Transportation needs:    Medical: Not on file    Non-medical: Not on file  Tobacco Use  . Smoking status: Current Every Day Smoker    Packs/day: 0.25    Years: 6.00    Pack years: 1.50    Types: Cigarettes  . Smokeless tobacco: Never Used  Substance and Sexual Activity  . Alcohol use:  Yes    Comment: social   . Drug use: Yes    Types: Marijuana    Comment: last used 04/17/2016  . Sexual activity: Yes    Birth control/protection: Condom    Comment: stated used condom 80% of the time   Lifestyle  . Physical activity:    Days per week: Not on file    Minutes per session: Not on file  . Stress: Not on file  Relationships  . Social connections:    Talks on phone: Not on file    Gets together: Not on file    Attends religious service: Not on file    Active member of club or organization: Not on file    Attends meetings of clubs or organizations: Not on file    Relationship status: Not on file  . Intimate partner violence:    Fear of current or ex partner: Not on file    Emotionally abused: Not on file    Physically abused: Not on file    Forced sexual activity: Not on file  Other Topics Concern  . Not on file  Social History Narrative  . Not on file    Family History  Problem Relation Age of Onset  . Thyroid disease Mother   . Diabetes Maternal Aunt   . Diabetes Maternal Grandmother   . Diabetes Maternal Grandfather   . Thyroid disease Unknown     Past Surgical History:  Procedure Laterality Date  . NO PAST SURGERIES      ROS: Review of Systems  PHYSICAL EXAM: BP 127/84   Pulse 78   Temp 98.1 F (36.7 C) (Oral)   Resp 16   Ht 6' (1.829 m)   Wt 227 lb 6.4 oz (103.1 kg)   SpO2 100%   BMI 30.84 kg/m   Physical Exam  General appearance - alert, well appearing, and in no distress Mental status - normal mood, behavior, speech, dress, motor activity, and thought processes Neck - supple, no significant adenopathy Chest - clear to auscultation, no wheezes, rales or rhonchi, symmetric air entry Heart - normal rate, regular rhythm, normal S1, S2, no murmurs, rubs, clicks or gallops Abdomen - soft, nontender, nondistended, no masses or organomegaly Extremities - peripheral pulses normal, no pedal edema, no clubbing or cyanosis  Depression screen  [facility] 2/9 09/28/2018 08/25/2018 07/02/2018  Decreased Interest 1 3 0  Down, Depressed, Hopeless 2 3 0  PHQ - 2 Score 3 6 0  Altered sleeping 3 3 1   Tired, decreased energy 3 3 0  Change in appetite 2 3 0  Feeling bad or failure about yourself  1 3 0  Trouble concentrating  0 3 0  Moving slowly or fidgety/restless 0 3 0  Suicidal thoughts 0 2 0  PHQ-9 Score 12 26 1     Results for orders placed or performed in visit on 09/28/18  POCT glucose (manual entry)  Result Value Ref Range   POC Glucose 65 (A) 70 - 99 mg/dl  POCT glycosylated hemoglobin (Hb A1C)  Result Value Ref Range   Hemoglobin A1C     HbA1c POC (<> result, manual entry)     HbA1c, POC (prediabetic range)     HbA1c, POC (controlled diabetic range) 7.9 (A) 0.0 - 7.0 %  POCT glucose (manual entry)  Result Value Ref Range   POC Glucose 97 70 - 99 mg/dl    ASSESSMENT AND PLAN: 1. Diabetes mellitus type 1, uncontrolled, without complications (HCC) A1c has improved 3 points since last checked. Again I encourage patient to write down his blood sugar readings and bring them in on office visit. Encouraged him to try and eat several small meals a day instead of 3 large ones. Will refer to endocrinology for help with management. - POCT glucose (manual entry) - POCT glycosylated hemoglobin (Hb A1C) - INSTA-GLUCOSE 77.4 % GEL 1 Tube - Insulin Glargine (LANTUS SOLOSTAR) 100 UNIT/ML Solostar Pen; Inject 15 Units into the skin daily at 10 pm.  Dispense: 5 pen; Refill: 12 - insulin aspart (NOVOLOG) 100 UNIT/ML FlexPen; Inject 15 Units into the skin 3 (three) times daily with meals.  Dispense: 15 mL; Refill: 12 - glucose blood (TRUE METRIX BLOOD GLUCOSE TEST) test strip; 1 each by Other route 3 (three) times daily.  Dispense: 100 each; Refill: 12 - POCT glucose (manual entry) - Ambulatory referral to Endocrinology - Ambulatory referral to Ophthalmology  2. Hypoglycemia due to type 1 diabetes mellitus (HCC) Given a tube of glucose gel  today in the office with subsequent increase in blood sugar to 97.  Advised not to take NovoLog insulin until after he eats his meals  3. Erectile dysfunction, unspecified erectile dysfunction type Most likely related to his diabetes.  He wants to try Viagra. Pt advised of possible side effects of this medication including prolong erection, flushing, headaches, stuff nose and sudden vision and hearing changes.  Pt told to be seen in ER if he has erection lasting longer than 3-4 hrs, or if he has sudden vision changes or hearing loss.  - sildenafil (VIAGRA) 50 MG tablet; Take 1 tablet half hour prior to sexual intercourse as needed.  Limit use to 1 tablet / 24 hours  Dispense: 20 tablet; Refill: 1  4. Tobacco dependence Patient advised to quit smoking. Discussed health risks associated with smoking including lung and other types of cancers, chronic lung diseases and CV risks.. Pt ready to give trail of quitting.  Discussed methods to help quit including quitting cold [place], use of NRT, Chantix and Bupropion.  He is wanting to try the nicotine patches starting with the 14 mg patch.  We discussed the stepdown approach. About 5 minutes spent on counseling - nicotine (NICODERM CQ - DOSED IN MG/24 HOURS) 14 mg/24hr patch; Place 1 patch (14 mg total) onto the skin daily.  Dispense: 28 patch; Refill: 0  5. Chronic abdominal pain Advised to take Reglan whenever he feels the nausea.  Will refer to GI for further evaluation.  Gastric emptying study and gallbladder ultrasound unrevealing. - Ambulatory referral to Gastroenterology   Patient was given the opportunity to ask questions.  Patient verbalized understanding of the plan and was  able to repeat key elements of the plan.   Orders Placed This Encounter  Procedures  . Ambulatory referral to Endocrinology  . Ambulatory referral to Ophthalmology  . Ambulatory referral to Gastroenterology  . POCT glucose (manual entry)  . POCT glycosylated  hemoglobin (Hb A1C)  . POCT glucose (manual entry)     Requested Prescriptions   Signed Prescriptions Disp Refills  . Insulin Glargine (LANTUS SOLOSTAR) 100 UNIT/ML Solostar Pen 5 pen 12    Sig: Inject 15 Units into the skin daily at 10 pm.  . insulin aspart (NOVOLOG) 100 UNIT/ML FlexPen 15 mL 12    Sig: Inject 15 Units into the skin 3 (three) times daily with meals.  Marland Kitchen. glucose blood (TRUE METRIX BLOOD GLUCOSE TEST) test strip 100 each 12    Sig: 1 each by Other route 3 (three) times daily.  . nicotine (NICODERM CQ - DOSED IN MG/24 HOURS) 14 mg/24hr patch 28 patch 0    Sig: Place 1 patch (14 mg total) onto the skin daily.  . sildenafil (VIAGRA) 50 MG tablet 20 tablet 1    Sig: Take 1 tablet half hour prior to sexual intercourse as needed.  Limit use to 1 tablet / 24 hours    Return in about 3 months (around 12/28/2018).  Jonah Blueeborah , MD, FACP

## 2018-12-30 ENCOUNTER — Ambulatory Visit: Payer: Medicaid Other | Admitting: Internal Medicine

## 2019-01-03 ENCOUNTER — Ambulatory Visit: Payer: Medicaid Other | Admitting: Internal Medicine

## 2019-01-21 ENCOUNTER — Encounter: Payer: Self-pay | Admitting: Internal Medicine

## 2019-01-21 ENCOUNTER — Ambulatory Visit (INDEPENDENT_AMBULATORY_CARE_PROVIDER_SITE_OTHER): Payer: Medicaid Other | Admitting: Internal Medicine

## 2019-01-21 ENCOUNTER — Other Ambulatory Visit: Payer: Self-pay

## 2019-01-21 DIAGNOSIS — E1042 Type 1 diabetes mellitus with diabetic polyneuropathy: Secondary | ICD-10-CM | POA: Diagnosis not present

## 2019-01-21 DIAGNOSIS — E1065 Type 1 diabetes mellitus with hyperglycemia: Secondary | ICD-10-CM | POA: Insufficient documentation

## 2019-01-21 MED ORDER — GLUCOSE BLOOD VI STRP: ORAL_STRIP | 12 refills | Status: DC

## 2019-01-21 MED ORDER — ACCU-CHEK GUIDE ME W/DEVICE KIT: 1.0000 | PACK | Freq: Three times a day (TID) | 0 refills | Status: DC

## 2019-01-21 MED FILL — TRUEplus 5-BEVEL PEN NEEDLE: 31G X 8 MM | 25 days supply | Qty: 100 | Fill #1

## 2019-01-21 MED FILL — NOVOLOG FLEXPEN SYRINGE: 100 | 33 days supply | Qty: 15 | Fill #0

## 2019-01-21 MED FILL — ACCU-CHEK GUIDE TEST STRIP: 37 days supply | Qty: 150 | Fill #0

## 2019-01-21 MED FILL — LANTUS SOLOSTAR 100 UNITS/M: 100 | 90 days supply | Qty: 15 | Fill #0

## 2019-01-21 NOTE — Progress Notes (Signed)
Virtual Visit via Video Note  I connected with Jonathan Porter on 01/21/19 at 10:50 AM EDT by a video enabled telemedicine application and verified that I am speaking with the correct person using two identifiers.   I discussed the limitations of evaluation and management by telemedicine and the availability of in person appointments. The patient expressed understanding and agreed to proceed.  -Location of the patient : Work  -Location of the provider : office  -The names of all persons participating in the telemedicine service : Pt and myself     Name: Jonathan Porter  MRN/ DOB: [id number], 18-Mar-1991   Age/ Sex: 28 y.o., male    PCP: Jonathan Pier, MD   Reason for Endocrinology Evaluation: Type 1 Diabetes Mellitus     Date of Initial Endocrinology Visit: 01/21/2019     PATIENT IDENTIFIER: Mr. Jonathan Porter is a 28 y.o. male with a past medical history of T1DM, Dyslipidemia. The patient presented for initial endocrinology clinic visit on 01/21/2019 for consultative assistance with his diabetes management.    HPI: Jonathan Porter was    Diagnosed with T1 DM at age 76 , he presented with DKA Prior Medications tried/Intolerance: N/A Currently checking blood sugars 0 x / day,  Hypoglycemia episodes : no          Hemoglobin A1c has ranged from 7.9 % in 2020, peaking at 17.2 % in 2017. Patient required assistance for hypoglycemia: no Patient has required hospitalization within the last 1 year from hyper or hypoglycemia: no  In terms of diet, the patient does not drinks any sugar-sweetened beverages (stoped end of 2019) but he admits to grazing all day due to consistent nausea and vomiting.   Works for a [employer] a 34 and 28 yr old.      HOME DIABETES REGIMEN: Lantus 15 units - ran out 2 days  Novolog 15 units TID QAC - ranout 2 days ago   Statin: no ACE-I/ARB: no Prior Diabetic Education: yes   GLUCOSE LOG:  Does not check   DIABETIC  COMPLICATIONS: Microvascular complications:   Neuropathy  Denies: CKD, retinopathy   Last eye exam: Completed 09/2017  Macrovascular complications:    Denies: CAD, PVD, CVA   PAST HISTORY: Past Medical History:  Past Medical History:  Diagnosis Date   Diabetes mellitus without complication ([place])    Past Surgical History:  Past Surgical History:  Procedure Laterality Date   NO PAST SURGERIES        Social History:  reports that he has quit smoking. His smoking use included cigarettes. He has a 1.50 pack-year smoking history. He has never used smokeless tobacco. He reports current alcohol use. He reports current drug use. Drug: Marijuana. Family History:  Family History  Problem Relation Age of Onset   Thyroid disease Mother    Diabetes Maternal Aunt    Diabetes Maternal Grandmother    Diabetes Maternal Grandfather    Thyroid disease Unknown      HOME MEDICATIONS: Allergies as of 01/21/2019      Reactions   Shellfish Allergy Anaphylaxis      Medication List       Accurate as of Jan 21, 2019 12:35 PM. Always use your most recent med list.        Accu-Chek FastClix Lancets Misc Use to check blood sugar up to 3 times daily.   Accu-Chek Guide Me w/Device Kit 1 kit by Does not apply route 3 (three) times daily. Use  to check blood sugar up to 3 times daily.   gabapentin 100 MG capsule Commonly known as:  NEURONTIN Take 1 capsule (100 mg total) by mouth 3 (three) times daily.   glucose blood test strip Commonly known as:  True Metrix Blood Glucose Test 1 each by Other route 3 (three) times daily.   glucose blood test strip Commonly known as:  Accu-Chek Guide 4x a day   imiquimod 5 % cream Commonly known as:  [place] Apply a thin layer 3 times per week (on alternate days) prior to bedtime; leave on skin for 6 to 10 hours, then remove with mild soap and water. Continue until there is total clearance of the genital/perianal warts or for a maximum  duration of therapy of 16 weeks   insulin aspart 100 UNIT/ML FlexPen Commonly known as:  NOVOLOG Inject 15 Units into the skin 3 (three) times daily with meals.   Insulin Glargine 100 UNIT/ML Solostar Pen Commonly known as:  Lantus SoloStar Inject 15 Units into the skin daily at 10 pm.   Insulin Pen Needle 31G X 8 MM Misc Commonly known as:  B-D ULTRAFINE III SHORT PEN 1 application by Does not apply route 4 (four) times daily.   metoCLOPramide 10 MG tablet Commonly known as:  REGLAN Take 1 tablet (10 mg total) by mouth every 6 (six) hours.   nicotine 14 mg/24hr patch Commonly known as:  NICODERM CQ - dosed in mg/24 hours Place 1 patch (14 mg total) onto the skin daily.   sildenafil 50 MG tablet Commonly known as:  Viagra Take 1 tablet half hour prior to sexual intercourse as needed.  Limit use to 1 tablet / 24 hours        ALLERGIES: Allergies  Allergen Reactions   Shellfish Allergy Anaphylaxis     REVIEW OF SYSTEMS: A comprehensive ROS was conducted with the patient and is negative except as per HPI and below:  Review of Systems  Constitutional: Positive for weight loss. Negative for fever.  HENT: Negative for congestion and sore throat.   Eyes: Negative for blurred vision and pain.  Respiratory: Negative for cough and shortness of breath.   Cardiovascular: Negative for chest pain and palpitations.  Gastrointestinal: Positive for heartburn, nausea and vomiting.  Genitourinary: Positive for frequency.  Neurological: Positive for tingling. Negative for tremors.  Endo/Heme/Allergies: Positive for polydipsia.  Psychiatric/Behavioral: Positive for depression. The patient is nervous/anxious.         DATA REVIEWED:  Lab Results  Component Value Date   HGBA1C 7.9 (A) 09/28/2018   HGBA1C 10.9 (A) 05/21/2018   HGBA1C 17.2 (H) 04/18/2016    Results for Jonathan Porter, Jonathan Porter (MRN [id number]) as of 01/21/2019 07:52  Ref. Range 09/01/2007 14:57  Glutamic Acid Decarb Ab  Latest Ref Range: <1.5 U/mL 8.6 (H)   Results for Jonathan Porter, Jonathan Porter (MRN [id number]) as of 01/21/2019 07:52  Ref. Range 05/21/2018 12:08  MICROALB/CREAT RATIO Latest Ref Range: 0.0 - 30.0 mg/g creat 3.1   Labs  02/01/2018 T. Chol 174 mg/dL TG 49 mg/dL  LDL 101 mg/dL  HDL 63 mg/dL     Old records , labs and images have been reviewed.    ASSESSMENT / PLAN / RECOMMENDATIONS:   1) Type 1 Diabetes Mellitus, Poorly controlled, With neuropathic  complications - Most recent A1c of 7.9 %. Goal A1c < 7.0 %.   Plan: GENERAL:  Poorly controlled diabetes is due to medication non-adherence , and dietary indiscretions.   He has not  checked his glucose in 2 months stating his son broke his machine (he was given a new one by his PCP in 09/2018).  He also ran out of insulin 2 days ago, pt stated he has not been able to get a refill but in review of his prescriptions, patient had prescriptions sent to the pharmacy in January,2020 for lantus and novolog with 12 yrs. I explained the above to him.  We will resend another prescription for accu-chek guide I have discussed with the patient the pathophysiology of diabetes. We went over the natural progression of the disease. We talked about both insulin resistance and insulin deficiency. We stressed the importance of lifestyle changes including diet and exercise. I explained the complications associated with diabetes including retinopathy, nephropathy, neuropathy as well as increased risk of cardiovascular disease. We went over the benefit seen with glycemic control.    I explained to the patient that diabetic patients are at higher than normal risk for amputations. The patient was informed that diabetes is the number one cause of non-traumatic amputations in [place].   Discussed pharmacokinetics of basal/bolus insulin and the importance of taking prandial insulin with meals.   We also discussed avoiding sugar-sweetened beverages and snacks, when possible.    Unfortunately with no glucose data available to me today, I am not able to make any changes to his insulin regimen, I have explained to him the importance of taking Novolog with meals and to avoid snacking.   MEDICATIONS:  Lantus 15 units daily   Novolog 15 units TID QAC  EDUCATION / INSTRUCTIONS:  BG monitoring instructions: Patient is instructed to check his blood sugars 4 times a day, before meals and bedtime.  Call [facility] if: BG persistently < 70 or > 300.  I reviewed the Rule of 15 for the treatment of hypoglycemia in detail with the patient. Literature supplied.   2) Diabetic complications:   Eye: Does not have known diabetic retinopathy.   Neuro/ Feet: Does  have known diabetic peripheral neuropathy.  Renal: Patient does not have known baseline CKD. He is not on an ACEI/ARB at present.   3) Lipids: Statins are not indicated at this time due to age, and NO prior Hx of CVD. His LDL in 01/2018 101 mg/dL    4) Chronic Nausea and Vomiting : - His gastric emptying studies have been normal in 06/2018. Other explanation  Is severe hyperglycemia causing such symptoms vs GERD.      I discussed the assessment and treatment plan with the patient. The patient was provided an opportunity to ask questions and all were answered. The patient agreed with the plan and demonstrated an understanding of the instructions.   The patient was advised to call back or seek an in-person evaluation if the symptoms worsen or if the condition fails to improve as anticipated.  F/u in 6 weeks    Signed electronically by: Jonathan Guise, MD  [facility]  [facility] [address] [place], [place] [address] Phone: [phone] FAX: [phone]   CC: Jonathan Pier, MD [place] [address] Phone: [phone] Fax: [phone]   Return to Endocrinology clinic as below: No future appointments.

## 2019-03-03 ENCOUNTER — Other Ambulatory Visit: Payer: Self-pay

## 2019-03-03 ENCOUNTER — Ambulatory Visit: Payer: Medicaid Other | Attending: Internal Medicine | Admitting: Physician Assistant

## 2019-03-03 DIAGNOSIS — N529 Male erectile dysfunction, unspecified: Secondary | ICD-10-CM

## 2019-03-03 DIAGNOSIS — L853 Xerosis cutis: Secondary | ICD-10-CM | POA: Diagnosis not present

## 2019-03-03 MED ORDER — SILDENAFIL CITRATE 50 MG PO TABS: ORAL_TABLET | ORAL | 2 refills | Status: DC

## 2019-03-03 MED ORDER — TRIAMCINOLONE ACETONIDE 0.1 % EX CREA: 1.0000 "application " | TOPICAL_CREAM | Freq: Two times a day (BID) | CUTANEOUS | 1 refills | Status: DC

## 2019-03-03 NOTE — Progress Notes (Signed)
Pt. Is asking if can get a hydrocortisone refill for his rash on his neck.   Pt. Stated the sun irritates his skin.

## 2019-03-03 NOTE — Progress Notes (Signed)
Virtual Visit via Telephone Note  I connected with Jonathan Porter on 03/03/19 at 10:10 AM EDT by telephone and verified that I am speaking with the correct person using two identifiers.   I discussed the limitations, risks, security and privacy concerns of performing an evaluation and management service by telephone and the availability of in person appointments. I also discussed with the patient that there may be a patient responsible charge related to this service. The patient expressed understanding and agreed to proceed.  Patient location:  home My Location:  [place] office Persons on the call:  Myself and the patient  History of Present Illness: needs a prescription for hydrocortisone cream for itchy dry skin he gets on his neck periodically.    Blood sugars bt 100-200 and being followed by endocrine.   Also needs RF of Viagra.   No CP.  No FH early cardiac issues    Observations/Objective:  A&Ox3   Assessment and Plan: 1. Erectile dysfunction, unspecified erectile dysfunction type No nitrates.   - sildenafil (VIAGRA) 50 MG tablet; Take 1 tablet half hour prior to sexual intercourse as needed.  Limit use to 1 tablet / 24 hours  Dispense: 20 tablet; Refill: 2  2. Dry skin triamcinilone sent   Follow Up Instructions: Continue f/up endocrine for DM.  See PCP in 3 months;  Sooner if needed   I discussed the assessment and treatment plan with the patient. The patient was provided an opportunity to ask questions and all were answered. The patient agreed with the plan and demonstrated an understanding of the instructions.   The patient was advised to call back or seek an in-person evaluation if the symptoms worsen or if the condition fails to improve as anticipated.  I provided 12 minutes of non-face-to-face time during this encounter.   Jonathan Caldron, PA-C  Patient ID: Jonathan Porter, male   DOB: 05-13-1991, 28 y.o.   MRN: [id number]

## 2019-03-04 ENCOUNTER — Ambulatory Visit: Payer: Medicaid Other | Admitting: Internal Medicine

## 2019-03-11 ENCOUNTER — Ambulatory Visit: Payer: Medicaid Other | Admitting: Internal Medicine

## 2019-04-11 ENCOUNTER — Encounter (HOSPITAL_COMMUNITY): Payer: Self-pay

## 2019-04-11 ENCOUNTER — Emergency Department (HOSPITAL_COMMUNITY): Payer: Medicaid Other

## 2019-04-11 ENCOUNTER — Other Ambulatory Visit: Payer: Self-pay

## 2019-04-11 ENCOUNTER — Emergency Department (HOSPITAL_COMMUNITY)
Admission: EM | Admit: 2019-04-11 | Discharge: 2019-04-11 | Disposition: A | Discharge status: Home / Self Care | Payer: Medicaid Other | Attending: Emergency Medicine | Admitting: Emergency Medicine

## 2019-04-11 DIAGNOSIS — I880 Nonspecific mesenteric lymphadenitis: Secondary | ICD-10-CM | POA: Diagnosis not present

## 2019-04-11 DIAGNOSIS — E1042 Type 1 diabetes mellitus with diabetic polyneuropathy: Secondary | ICD-10-CM | POA: Diagnosis not present

## 2019-04-11 DIAGNOSIS — R112 Nausea with vomiting, unspecified: Secondary | ICD-10-CM | POA: Diagnosis not present

## 2019-04-11 DIAGNOSIS — F17210 Nicotine dependence, cigarettes, uncomplicated: Secondary | ICD-10-CM | POA: Diagnosis not present

## 2019-04-11 DIAGNOSIS — Z794 Long term (current) use of insulin: Secondary | ICD-10-CM | POA: Diagnosis not present

## 2019-04-11 DIAGNOSIS — E860 Dehydration: Secondary | ICD-10-CM | POA: Diagnosis not present

## 2019-04-11 DIAGNOSIS — Z79899 Other long term (current) drug therapy: Secondary | ICD-10-CM | POA: Insufficient documentation

## 2019-04-11 DIAGNOSIS — R1013 Epigastric pain: Secondary | ICD-10-CM | POA: Diagnosis present

## 2019-04-11 LAB — COMPREHENSIVE METABOLIC PANEL
ALT: 30 U/L (ref 0–44)
AST: 25 U/L (ref 15–41)
Albumin: 4.1 g/dL (ref 3.5–5.0)
Alkaline Phosphatase: 107 U/L (ref 38–126)
Anion gap: 11 (ref 5–15)
BUN: 6 mg/dL (ref 6–20)
CO2: 24 mmol/L (ref 22–32)
Calcium: 9.2 mg/dL (ref 8.9–10.3)
Chloride: 106 mmol/L (ref 98–111)
Creatinine, Ser: 1.12 mg/dL (ref 0.61–1.24)
GFR calc Af Amer: >60 mL/min (ref >60)
GFR calc non Af Amer: >60 mL/min (ref >60)
Glucose, Bld: 182 mg/dL — ABNORMAL HIGH (ref 70–99)
Potassium: 2.9 mmol/L — ABNORMAL LOW (ref 3.5–5.1)
Sodium: 141 mmol/L (ref 135–145)
Total Bilirubin: 0.4 mg/dL (ref 0.3–1.2)
Total Protein: 7.4 g/dL (ref 6.5–8.1)

## 2019-04-11 LAB — URINALYSIS, ROUTINE W REFLEX MICROSCOPIC
Bilirubin Urine: NEGATIVE
Glucose, UA: NEGATIVE mg/dL
Hgb urine dipstick: NEGATIVE
Ketones, ur: 20 mg/dL — AB
Nitrite: NEGATIVE
Protein, ur: 30 mg/dL — AB
Specific Gravity, Urine: 1.023 (ref 1.005–1.030)
pH: 6 (ref 5.0–8.0)

## 2019-04-11 LAB — CBC WITH DIFFERENTIAL/PLATELET
Abs Immature Granulocytes: 0.04 10*3/uL (ref 0.00–0.07)
Basophils Absolute: 0.1 10*3/uL (ref 0.0–0.1)
Basophils Relative: 1 %
Eosinophils Absolute: 0.2 10*3/uL (ref 0.0–0.5)
Eosinophils Relative: 3 %
HCT: 45.7 % (ref 39.0–52.0)
Hemoglobin: 14.9 g/dL (ref 13.0–17.0)
Immature Granulocytes: 0 %
Lymphocytes Relative: 15 %
Lymphs Abs: 1.4 10*3/uL (ref 0.7–4.0)
MCH: 26.8 pg (ref 26.0–34.0)
MCHC: 32.6 g/dL (ref 30.0–36.0)
MCV: 82 fL (ref 80.0–100.0)
Monocytes Absolute: 0.3 10*3/uL (ref 0.1–1.0)
Monocytes Relative: 4 %
Neutro Abs: 7.1 10*3/uL (ref 1.7–7.7)
Neutrophils Relative %: 77 %
Platelets: 286 10*3/uL (ref 150–400)
RBC: 5.57 MIL/uL (ref 4.22–5.81)
RDW: 13.2 % (ref 11.5–15.5)
WBC: 9.2 10*3/uL (ref 4.0–10.5)
nRBC: 0 % (ref 0.0–0.2)

## 2019-04-11 LAB — POCT I-STAT EG7
Acid-base deficit: 1 mmol/L (ref 0.0–2.0)
Bicarbonate: 24.5 mmol/L (ref 20.0–28.0)
Calcium, Ion: 1.05 mmol/L — ABNORMAL LOW (ref 1.15–1.40)
HCT: 44 % (ref 39.0–52.0)
Hemoglobin: 15 g/dL (ref 13.0–17.0)
O2 Saturation: 89 %
Potassium: 3.7 mmol/L (ref 3.5–5.1)
Sodium: 143 mmol/L (ref 135–145)
TCO2: 26 mmol/L (ref 22–32)
pCO2, Ven: 41.6 mmHg — ABNORMAL LOW (ref 44.0–60.0)
pH, Ven: 7.378 (ref 7.250–7.430)
pO2, Ven: 57 mmHg — ABNORMAL HIGH (ref 32.0–45.0)

## 2019-04-11 LAB — LIPASE, BLOOD: Lipase: 19 U/L (ref 11–51)

## 2019-04-11 LAB — LACTIC ACID, PLASMA: Lactic Acid, Venous: 3 mmol/L (ref 0.5–1.9)

## 2019-04-11 MED ORDER — SODIUM CHLORIDE 0.9 % IV BOLUS
1000.0000 mL | Freq: Once | INTRAVENOUS | Status: AC
  Administered 2019-04-11: 1000 mL via INTRAVENOUS

## 2019-04-11 MED ORDER — FENTANYL CITRATE (PF) 100 MCG/2ML IJ SOLN
50.0000 ug | Freq: Once | INTRAMUSCULAR | Status: AC
  Administered 2019-04-11: 50 ug via INTRAVENOUS
  Filled 2019-04-11: qty 2

## 2019-04-11 MED ORDER — PANTOPRAZOLE SODIUM 40 MG IV SOLR
40.0000 mg | Freq: Once | INTRAVENOUS | Status: AC
  Administered 2019-04-11: 40 mg via INTRAVENOUS
  Filled 2019-04-11: qty 40

## 2019-04-11 MED ORDER — LACTATED RINGERS IV BOLUS
1000.0000 mL | Freq: Once | INTRAVENOUS | Status: AC
  Administered 2019-04-11: 1000 mL via INTRAVENOUS

## 2019-04-11 MED ORDER — METOCLOPRAMIDE HCL 5 MG/ML IJ SOLN
10.0000 mg | Freq: Once | INTRAMUSCULAR | Status: AC
  Administered 2019-04-11: 10 mg via INTRAVENOUS
  Filled 2019-04-11: qty 2

## 2019-04-11 MED ORDER — IOHEXOL 300 MG/ML  SOLN
100.0000 mL | Freq: Once | INTRAMUSCULAR | Status: AC | PRN
  Administered 2019-04-11: 30 mL via INTRAVENOUS

## 2019-04-11 MED ORDER — DICYCLOMINE HCL 10 MG/ML IM SOLN
20.0000 mg | Freq: Once | INTRAMUSCULAR | Status: AC
  Administered 2019-04-11: 20 mg via INTRAMUSCULAR
  Filled 2019-04-11: qty 2

## 2019-04-11 MED ORDER — METOCLOPRAMIDE HCL 10 MG PO TABS: 10.0000 mg | ORAL_TABLET | Freq: Four times a day (QID) | ORAL | 0 refills | Status: DC

## 2019-04-11 MED ORDER — DICYCLOMINE HCL 20 MG PO TABS: 20.0000 mg | ORAL_TABLET | Freq: Two times a day (BID) | ORAL | 0 refills | Status: DC

## 2019-04-11 MED ORDER — OMEPRAZOLE 20 MG PO CPDR: 20.0000 mg | DELAYED_RELEASE_CAPSULE | Freq: Every day | ORAL | 0 refills | Status: DC

## 2019-04-11 MED FILL — NOVOLOG FLEXPEN SYRINGE: 100 | 33 days supply | Qty: 15 | Fill #1

## 2019-04-11 NOTE — ED Triage Notes (Signed)
Patient BIB by [facility] due to having severe abd pain with n/v. Patient vomited x2 with ems clear yellow liquid. Pts young son at bedside. Patient still having sever abd pain at this time.

## 2019-04-11 NOTE — ED Notes (Signed)
Pt stated that he will try and get [place] some urine.

## 2019-04-11 NOTE — ED Provider Notes (Signed)
[facility] EMERGENCY DEPARTMENT Provider Note   CSN: [id number] Arrival date & time: 04/11/19  1615     History   Chief Complaint Chief Complaint  Jonathan Porter presents with   Abdominal Pain   Nausea    HPI Jonathan Porter is a 28 y.o. male.     The history is provided by the Jonathan Porter and the EMS personnel.  Abdominal Pain Pain location:  Epigastric Pain quality: aching, cramping and shooting   Pain radiates to:  Does not radiate Pain severity:  Severe Onset quality:  Gradual Duration:  9 hours Timing:  Constant Progression:  Worsening Chronicity:  Recurrent Context comment:  Jonathan Porter states the pain was present when he woke up this morning but approximately 1 to 2 hours ago it became significantly worse.  Pain is now a 10 out of 10 and he cannot get comfortable Relieved by:  Nothing Worsened by:  Nothing Ineffective treatments:  Not moving and position changes Associated symptoms: anorexia, nausea and vomiting   Associated symptoms: no chest pain, no chills, no constipation, no cough, no diarrhea, no dysuria, no fever, no hematuria and no shortness of breath   Associated symptoms comment:  Jonathan Porter has not been able to eat or drink today but did take his insulin.  He denies alcohol or drug use. 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 Authorizing Provider  ACCU-CHEK FASTCLIX LANCETS MISC Use to check blood sugar up to 3 times daily. Jonathan Porter not taking: Reported on 01/21/2019 09/28/18   Ladell Pier, MD  Blood Glucose Monitoring Suppl (ACCU-CHEK GUIDE ME) w/Device KIT 1 kit by Does not apply route 3 (three) times daily. Use to check blood sugar up to 3 times daily. 01/21/19   Shamleffer, Melanie Crazier, MD  gabapentin (NEURONTIN) 100 MG capsule Take 1 capsule (100 mg total) by mouth 3 (three) times daily. Jonathan Porter not taking: Reported on 09/28/2018 08/25/18   Gildardo Pounds, NP  glucose blood (ACCU-CHEK GUIDE) test strip 4x a day 01/21/19   Shamleffer, Melanie Crazier, MD  glucose blood (TRUE METRIX BLOOD GLUCOSE TEST) test strip 1 each by Other route 3 (three) times daily. Jonathan Porter not taking: Reported on 03/03/2019 09/28/18   Ladell Pier, MD  imiquimod [place]) 5 % cream Apply a thin layer 3 times per week (on alternate days) prior to bedtime; leave on skin for 6 to 10 hours, then remove with mild soap and water. Continue until there is total clearance of the genital/perianal warts or for a maximum duration of therapy of 16 weeks 07/02/18   Ladell Pier, MD  insulin aspart (NOVOLOG) 100 UNIT/ML FlexPen Inject 15 Units into the skin  3 (three) times daily with meals. 09/28/18   Ladell Pier, MD  Insulin Glargine (LANTUS SOLOSTAR) 100 UNIT/ML Solostar Pen Inject 15 Units into the skin daily at 10 pm. 09/28/18   Ladell Pier, MD  Insulin Pen Needle (B-D ULTRAFINE III SHORT PEN) 31G X 8 MM MISC 1 application by Does not apply route 4 (four) times daily. 05/21/18   Ladell Pier, MD  metoCLOPramide (REGLAN) 10 MG tablet Take 1 tablet (10 mg total) by mouth every 6 (six) hours. Jonathan Porter not taking: Reported on 09/28/2018 06/29/18   Keenan Bachelor, MD  nicotine  (NICODERM CQ - DOSED IN MG/24 HOURS) 14 mg/24hr patch Place 1 patch (14 mg total) onto the skin daily. Jonathan Porter not taking: Reported on 01/21/2019 09/28/18   Ladell Pier, MD  sildenafil (VIAGRA) 50 MG tablet Take 1 tablet half hour prior to sexual intercourse as needed.  Limit use to 1 tablet / 24 hours 03/03/19   Argentina Donovan, PA-C  triamcinolone cream (KENALOG) 0.1 % Apply 1 application topically 2 (two) times daily. 03/03/19   Argentina Donovan, PA-C    Family History Family History  Problem Relation Age of Onset   Thyroid disease Mother    Diabetes Maternal Aunt    Diabetes Maternal Grandmother    Diabetes Maternal Grandfather    Thyroid disease Other     Social History Social History   Tobacco Use   Smoking status: Current Every Day Smoker    Packs/day: 0.25    Years: 6.00    Pack years: 1.50    Types: Cigarettes   Smokeless tobacco: Never Used  Substance Use Topics   Alcohol use: Yes    Comment: social    Drug use: Yes    Types: Marijuana    Comment: last used 04/17/2016     Allergies   Shellfish allergy   Review of Systems Review of Systems  Constitutional: Negative for chills and fever.  Respiratory: Negative for cough and shortness of breath.   Cardiovascular: Negative for chest pain.  Gastrointestinal: Positive for abdominal pain, anorexia, nausea and vomiting. Negative for constipation and diarrhea.  Genitourinary: Negative for dysuria and hematuria.  All other systems reviewed and are negative.    Physical Exam Updated Vital Signs BP (!) 147/105 (BP Location: Right Arm)    Pulse (!) 57    Temp 98.5 F (36.9 C) (Oral)    Resp 15    Ht 5' 11"  (1.803 m)    Wt 97.5 kg    SpO2 100%    BMI 29.99 kg/m   Physical Exam Vitals signs and nursing note reviewed.  Constitutional:      General: He is in acute distress.     Appearance: He is well-developed and normal weight.     Comments: Jonathan Porter appears uncomfortable and is shifting around in  the bed  HENT:     Head: Normocephalic and atraumatic.     Mouth/Throat:     Comments: Dry mucous membranes Eyes:     Conjunctiva/sclera: Conjunctivae normal.     Pupils: Pupils are equal, round, and reactive to light.  Neck:     Musculoskeletal: Normal range of motion and neck supple.  Cardiovascular:     Rate and Rhythm: Regular rhythm. Bradycardia present.     Heart sounds: No murmur.  Pulmonary:     Effort: Pulmonary effort is normal. No respiratory distress.     Breath sounds: Normal breath sounds. No wheezing or rales.  Abdominal:  General: Bowel sounds are normal. There is no distension.     Palpations: Abdomen is soft.     Tenderness: There is abdominal tenderness in the epigastric area. There is no right CVA tenderness, left CVA tenderness, guarding or rebound.     Hernia: No hernia is present.  Musculoskeletal: Normal range of motion.        General: No tenderness.  Skin:    General: Skin is warm and dry.     Capillary Refill: Capillary refill takes 2 to 3 seconds.     Findings: No erythema or rash.  Neurological:     General: No focal deficit present.     Mental Status: He is alert and oriented to person, place, and time.  Psychiatric:        Behavior: Behavior normal.      ED Treatments / Results  Labs (all labs ordered are listed, but only abnormal results are displayed) Labs Reviewed  COMPREHENSIVE METABOLIC PANEL - Abnormal; Notable for the following components:      Result Value   Potassium 2.9 (*)    Glucose, Bld 182 (*)    All other components within normal limits  LACTIC ACID, PLASMA - Abnormal; Notable for the following components:   Lactic Acid, Venous 3.0 (*)    All other components within normal limits  URINALYSIS, ROUTINE W REFLEX MICROSCOPIC - Abnormal; Notable for the following components:   Ketones, ur 20 (*)    Protein, ur 30 (*)    Leukocytes,Ua TRACE (*)    Bacteria, UA RARE (*)    All other components within normal limits  POCT  I-STAT EG7 - Abnormal; Notable for the following components:   pCO2, Ven 41.6 (*)    pO2, Ven 57.0 (*)    Calcium, Ion 1.05 (*)    All other components within normal limits  CBC WITH DIFFERENTIAL/PLATELET  LIPASE, BLOOD    EKG EKG Interpretation  Date/Time:  Monday April 11 2019 16:22:43 EDT Ventricular Rate:  60 PR Interval:    QRS Duration: 82 QT Interval:  462 QTC Calculation: 442 R Axis:   66 Text Interpretation:  Wandering atrial pacemaker Artifact No significant change since last tracing Confirmed by Blanchie Dessert [phone]) on 04/11/2019 4:26:59 PM   Radiology Ct Abdomen Pelvis W Contrast  Result Date: 04/11/2019 CLINICAL DATA:  Epigastric abdominal pain. EXAM: CT ABDOMEN AND PELVIS WITH CONTRAST TECHNIQUE: Multidetector CT imaging of the abdomen and pelvis was performed using the standard protocol following bolus administration of intravenous contrast. CONTRAST:  52m OMNIPAQUE IOHEXOL 300 MG/ML  SOLN COMPARISON:  None. FINDINGS: Lower chest: Small bilateral pleural effusions. No infiltrates. Small nodule in the right middle lobe is likely a lymph node. No pericardial effusion. Hepatobiliary: No focal hepatic lesions or intrahepatic biliary dilatation. The portal and hepatic veins are patent. There is a small amount of fluid around the gallbladder but I do not see any obvious gallstones or wall thickening. There may be slight mucosal enhancement of the gallbladder wall. Pancreas: No mass, inflammation or ductal dilatation. Spleen: Normal size.  No focal lesions. Adrenals/Urinary Tract: The adrenal glands and kidneys are unremarkable. No renal lesions or hydronephrosis. The bladder appears normal. Stomach/Bowel: The stomach, duodenum, small bowel and colon are grossly normal without oral contrast. No acute inflammatory changes, mass lesions or obstructive findings. The terminal ileum and appendix are normal. Vascular/Lymphatic: The aorta is normal in caliber. No dissection. The branch  vessels are patent. The major venous structures are patent. No mesenteric  or retroperitoneal mass or adenopathy. Scattered mesenteric lymph nodes could suggest mesenteric adenitis. Reproductive: The prostate gland and seminal vesicles are unremarkable. Other: There is a small amount of free pelvic fluid of uncertain significance or etiology. There is also mild hazy interstitial changes in the small bowel mesentery. Musculoskeletal: No significant bony findings. IMPRESSION: 1. Small bilateral pleural effusions. 2. Small amount of free pelvic fluid and mild mesenteric edema. 3. Small amount of fluid around the gallbladder but no obvious gallstones or wall thickening. 4. Small scattered mesenteric lymph nodes could suggest mesenteric adenitis. Electronically Signed   By: Marijo Sanes M.D.   On: 04/11/2019 19:29   Dg Abd Portable 1 View  Result Date: 04/11/2019 CLINICAL DATA:  Epigastric pain for 2 days EXAM: PORTABLE ABDOMEN - 1 VIEW COMPARISON:  None. FINDINGS: Scattered large and small bowel gas is noted. No obstructive changes are seen. No free air is noted. No abnormal mass or abnormal calcifications are seen. Bony structures appear within normal limits. IMPRESSION: No acute abnormality noted. Electronically Signed   By: Inez Catalina M.D.   On: 04/11/2019 17:10    Procedures Procedures (including critical care time)  Medications Ordered in ED Medications  fentaNYL (SUBLIMAZE) injection 50 mcg (has no administration in time range)  metoCLOPramide (REGLAN) injection 10 mg (has no administration in time range)  sodium chloride 0.9 % bolus 1,000 mL (has no administration in time range)  pantoprazole (PROTONIX) injection 40 mg (has no administration in time range)     Initial Impression / Assessment and Plan / ED Course  I have reviewed the triage vital signs and the nursing notes.  Pertinent labs & imaging results that were available during my care of the Jonathan Porter were reviewed by me and  considered in my medical decision making (see chart for details).        Jonathan Porter is a 28 year old type I diabetic presenting with worsening abdominal pain.  Jonathan Porter states he has had epigastric pain all day but became severe approximately 1 hour prior to arrival.  Jonathan Porter called 911 and was noted to be bradycardic in the 40s during transport but otherwise had a blood sugar of 170 and normal vital signs.  Jonathan Porter denies any radiation of the pain, chest pain or shortness of breath.  He states he has had pain like this before but was never diagnosed with gastroparesis.  He denies any infectious symptoms, respiratory or chest complaints.  Jonathan Porter appears very uncomfortable and is repeatedly requesting pain medicine and shifting around in the bed.  He denies any recent alcohol use but uses marijuana daily. He did take his insulin this morning.  Concern for possible DKA versus acute abdominal process such as perforated ulcer versus gastroparesis.  Lower suspicion for MI, myocarditis, pneumothorax, PE.  EKG now with heart rates in the 60s without acute changes based on his prior.  Jonathan Porter given pain and nausea control.  IV fluids.  Labs and KUB are pending.  8:10 PM  Jonathan Porter's VBG is negative for metabolic acidosis and CMP with hypokalemia of 2.9 but blood sugar in the 100s and normal anion gap.  Lipase and LFTs are within normal limits.  Jonathan Porter's lactic acid is 3 and UA with 20 ketones.  Jonathan Porter denies any dysuria frequency or urgency.  Portable abdomen was negative for free air.  CT showed small bilateral pleural effusions and small amount of free pelvic fluid and mild mesenteric edema with small scattered mesenteric lymph nodes suggestive of mesenteric adenitis.  Jonathan Porter had a  right upper quadrant ultrasound done 1 year ago that was negative for gallstones or acute gallbladder pathology and Jonathan Porter has no right upper quadrant pain today to suggest that.  After 1 dose of pain medication Jonathan Porter felt felt much  better but after several hours the pain started to return.  On reevaluation Jonathan Porter states he has been dealing with these issues for over a year now.  He has lost over 150 pounds and states that daily he struggles with nausea and inability to eat.  He has had multiple GI appointments but because of COVID they have been canceled and he has had a hard time getting to see anyone.  Jonathan Porter is requesting to p.o. challenge.  Jonathan Porter given Bentyl and will p.o. challenge.  9:30 PM Jonathan Porter sleeping comfortably and was able to tolerate p.o.'s.  Will plan on discharge home with nausea medicine and Bentyl.  Given GI follow-up.  Jonathan Porter given strict return precautions. Final Clinical Impressions(s) / ED Diagnoses   Final diagnoses:  Non-intractable vomiting with nausea, unspecified vomiting type  Mesenteric adenitis  Dehydration    ED Discharge Orders         Ordered    metoCLOPramide (REGLAN) 10 MG tablet  Every 6 hours     04/11/19 2152    dicyclomine (BENTYL) 20 MG tablet  2 times daily     04/11/19 2152    omeprazole (PRILOSEC) 20 MG capsule  Daily     04/11/19 2152           Blanchie Dessert, MD 04/11/19 2152

## 2019-04-11 NOTE — ED Notes (Signed)
Patient verbalizes understanding of discharge instructions. Opportunity for questioning and answers were provided. pt discharged from ED. Ambulatory by self  

## 2019-04-11 NOTE — ED Notes (Signed)
Lactic  3.0    

## 2019-04-11 NOTE — Discharge Instructions (Signed)
Today your labs showed that you were dehydrated and on your x-ray there was some mild swelling of your bowel and lymph nodes.  Your CAT scan showed no signs of appendicitis, diverticulitis or issues with your liver or pancreas.  Since you have been having stomach issues for a year now with so much weight loss that it would be very important for you to follow-up with the GI doctor so they can do more evaluation.  You were prescribed medication for bowel spasm, acid production and nausea today.  Those will be at your doctor's office.

## 2019-04-12 ENCOUNTER — Encounter (HOSPITAL_COMMUNITY): Payer: Self-pay | Admitting: *Deleted

## 2019-04-12 ENCOUNTER — Other Ambulatory Visit: Payer: Self-pay

## 2019-04-12 ENCOUNTER — Emergency Department (HOSPITAL_COMMUNITY)
Admission: EM | Admit: 2019-04-12 | Discharge: 2019-04-12 | Disposition: A | Discharge status: Home / Self Care | Payer: Medicaid Other | Attending: Emergency Medicine | Admitting: Emergency Medicine

## 2019-04-12 DIAGNOSIS — R1013 Epigastric pain: Secondary | ICD-10-CM | POA: Insufficient documentation

## 2019-04-12 DIAGNOSIS — F17210 Nicotine dependence, cigarettes, uncomplicated: Secondary | ICD-10-CM | POA: Insufficient documentation

## 2019-04-12 DIAGNOSIS — E109 Type 1 diabetes mellitus without complications: Secondary | ICD-10-CM | POA: Diagnosis not present

## 2019-04-12 DIAGNOSIS — Z794 Long term (current) use of insulin: Secondary | ICD-10-CM | POA: Insufficient documentation

## 2019-04-12 DIAGNOSIS — R109 Unspecified abdominal pain: Secondary | ICD-10-CM

## 2019-04-12 LAB — COMPREHENSIVE METABOLIC PANEL
ALT: 28 U/L (ref 0–44)
AST: 24 U/L (ref 15–41)
Albumin: 4.2 g/dL (ref 3.5–5.0)
Alkaline Phosphatase: 115 U/L (ref 38–126)
Anion gap: 11 (ref 5–15)
BUN: 7 mg/dL (ref 6–20)
CO2: 25 mmol/L (ref 22–32)
Calcium: 9.6 mg/dL (ref 8.9–10.3)
Chloride: 103 mmol/L (ref 98–111)
Creatinine, Ser: 1.21 mg/dL (ref 0.61–1.24)
GFR calc Af Amer: >60 mL/min (ref >60)
GFR calc non Af Amer: >60 mL/min (ref >60)
Glucose, Bld: 223 mg/dL — ABNORMAL HIGH (ref 70–99)
Potassium: 3.8 mmol/L (ref 3.5–5.1)
Sodium: 139 mmol/L (ref 135–145)
Total Bilirubin: 0.7 mg/dL (ref 0.3–1.2)
Total Protein: 8 g/dL (ref 6.5–8.1)

## 2019-04-12 LAB — URINALYSIS, ROUTINE W REFLEX MICROSCOPIC
Bacteria, UA: NONE SEEN
Bilirubin Urine: NEGATIVE
Glucose, UA: 150 mg/dL — AB
Hgb urine dipstick: NEGATIVE
Ketones, ur: 80 mg/dL — AB
Leukocytes,Ua: NEGATIVE
Nitrite: NEGATIVE
Protein, ur: 30 mg/dL — AB
Specific Gravity, Urine: >1.046 — ABNORMAL HIGH (ref 1.005–1.030)
pH: 6 (ref 5.0–8.0)

## 2019-04-12 LAB — CBC WITH DIFFERENTIAL/PLATELET
Abs Immature Granulocytes: 0.08 10*3/uL — ABNORMAL HIGH (ref 0.00–0.07)
Basophils Absolute: 0.1 10*3/uL (ref 0.0–0.1)
Basophils Relative: 1 %
Eosinophils Absolute: 0 10*3/uL (ref 0.0–0.5)
Eosinophils Relative: 0 %
HCT: 46.2 % (ref 39.0–52.0)
Hemoglobin: 15.2 g/dL (ref 13.0–17.0)
Immature Granulocytes: 1 %
Lymphocytes Relative: 10 %
Lymphs Abs: 1.4 10*3/uL (ref 0.7–4.0)
MCH: 27.2 pg (ref 26.0–34.0)
MCHC: 32.9 g/dL (ref 30.0–36.0)
MCV: 82.6 fL (ref 80.0–100.0)
Monocytes Absolute: 0.4 10*3/uL (ref 0.1–1.0)
Monocytes Relative: 3 %
Neutro Abs: 11.5 10*3/uL — ABNORMAL HIGH (ref 1.7–7.7)
Neutrophils Relative %: 85 %
Platelets: 309 10*3/uL (ref 150–400)
RBC: 5.59 MIL/uL (ref 4.22–5.81)
RDW: 13.4 % (ref 11.5–15.5)
WBC: 13.4 10*3/uL — ABNORMAL HIGH (ref 4.0–10.5)
nRBC: 0 % (ref 0.0–0.2)

## 2019-04-12 LAB — CBG MONITORING, ED: Glucose-Capillary: 212 mg/dL — ABNORMAL HIGH (ref 70–99)

## 2019-04-12 LAB — LIPASE, BLOOD: Lipase: 17 U/L (ref 11–51)

## 2019-04-12 MED ORDER — FAMOTIDINE 20 MG PO TABS
20.0000 mg | ORAL_TABLET | Freq: Once | ORAL | Status: AC
  Administered 2019-04-12: 20 mg via ORAL
  Filled 2019-04-12: qty 1

## 2019-04-12 MED ORDER — SUCRALFATE 1 G PO TABS
1.0000 g | ORAL_TABLET | Freq: Once | ORAL | Status: AC
  Administered 2019-04-12: 1 g via ORAL
  Filled 2019-04-12: qty 1

## 2019-04-12 MED ORDER — ALUM & MAG HYDROXIDE-SIMETH 200-200-20 MG/5ML PO SUSP
30.0000 mL | Freq: Once | ORAL | Status: AC
  Administered 2019-04-12: 30 mL via ORAL
  Filled 2019-04-12: qty 30

## 2019-04-12 MED ORDER — ONDANSETRON 4 MG PO TBDP
4.0000 mg | ORAL_TABLET | Freq: Once | ORAL | Status: AC
  Administered 2019-04-12: 4 mg via ORAL
  Filled 2019-04-12: qty 1

## 2019-04-12 MED FILL — DICYCLOMINE 20 MG TABLET: 20 | 10 days supply | Qty: 20 | Fill #0

## 2019-04-12 MED FILL — METOCLOPRAMIDE 10 MG TABLET: 10 | 7 days supply | Qty: 30 | Fill #0

## 2019-04-12 MED FILL — OMEPRAZOLE 20 MG CAP: 20 | 30 days supply | Qty: 30 | Fill #0

## 2019-04-12 NOTE — ED Triage Notes (Signed)
Pt reports ongoing abdominal pain (seen prior for the same today), unable to fill meds. Pt is cursing at staff, threw emesis bag on the floor. Pt has young child with him, unable to find someone to stay with him, mask on pt and child.

## 2019-04-12 NOTE — ED Triage Notes (Signed)
Pt arrives via [facility] with a young child with c/o abdominal pain and dry heaves. En route  150/100, cbg 200. Pt just left the ED approx 30 minutes ago after eval.

## 2019-04-12 NOTE — ED Notes (Signed)
Pt lying on floor in the lobby rolling around without a shirt on yelling at staff. Apologized for delay.

## 2019-04-12 NOTE — ED Notes (Signed)
PT calling 911 from lobby on cell phone telling them "nobody is helping me and I am in serious pain".

## 2019-04-12 NOTE — ED Provider Notes (Signed)
[place] EMERGENCY DEPARTMENT Provider Note   CSN: [id number] Arrival date & time: 04/12/19  0035    History   Chief Complaint Chief Complaint  Patient presents with  . Abdominal Pain    HPI Jonathan Porter is a 28 y.o. male.     HPI   Pt is a 28 y/o male with a h/o T2DM, who presents to the ED today for eval of epigastric abd pain. Was seen in the ED last night and states he felt fine when he was discharged but his pain restarted about 45 minutes after getting home. Pain rated 4-5/10. States that while in the waiting room his sxs have improved a lot and his pain is tolerable now. Pain feels like an aching/cramping.   He states vomiting resolved several hours ago but he has not tried to eat anything.  He states that this is somewhat typical for him but he was unable to tolerate sxs today. Denies diarrhea.    He was discharge with reglan, bentyl and prilosec but has not filled rx yet. States that he smokes marijuana frequently to help him have an appetite so that he can eat. He states that he last used this morning.   States that he was supposed to f/u with GI but was unable to secondary to recent covid pandemic.   Past Medical History:  Diagnosis Date  . Diabetes mellitus without complication [facility])     Patient Active Problem List   Diagnosis Date Noted  . Type 1 diabetes mellitus with diabetic polyneuropathy ([place]) 01/21/2019  . Type 1 diabetes mellitus with hyperglycemia ([place]) 01/21/2019  . Paronychia of finger of left hand 01/20/2017  . 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 Diabetes type 1, uncontrolled ([place]) 08/23/2015  . Onychomycosis of toenail 08/23/2015  . Phimosis 08/07/2014    Past Surgical History:  Procedure Laterality Date  . NO PAST SURGERIES          Home Medications    Prior to Admission medications   Medication Sig Start Date End Date Taking? Authorizing Provider  ACCU-CHEK FASTCLIX LANCETS MISC Use  to check blood sugar up to 3 times daily. Patient not taking: Reported on 01/21/2019 09/28/18   Ladell Pier, MD  Blood Glucose Monitoring Suppl (ACCU-CHEK GUIDE ME) w/Device KIT 1 kit by Does not apply route 3 (three) times daily. Use to check blood sugar up to 3 times daily. 01/21/19   Shamleffer, Melanie Crazier, MD  dicyclomine (BENTYL) 20 MG tablet Take 1 tablet (20 mg total) by mouth 2 (two) times daily. 04/11/19   Blanchie Dessert, MD  gabapentin (NEURONTIN) 100 MG capsule Take 1 capsule (100 mg total) by mouth 3 (three) times daily. Patient not taking: Reported on 09/28/2018 08/25/18   Gildardo Pounds, NP  glucose blood (ACCU-CHEK GUIDE) test strip 4x a day 01/21/19   Shamleffer, Melanie Crazier, MD  glucose blood (TRUE METRIX BLOOD GLUCOSE TEST) test strip 1 each by Other route 3 (three) times daily. Patient not taking: Reported on 03/03/2019 09/28/18   Ladell Pier, MD  imiquimod [place] Sea) 5 % cream Apply a thin layer 3 times per week (on alternate days) prior to bedtime; leave on skin for 6 to 10 hours, then remove with mild soap and water. Continue until there is total clearance of the genital/perianal warts or for a maximum duration of therapy of 16 weeks 07/02/18   Ladell Pier, MD  insulin aspart (NOVOLOG) 100 UNIT/ML FlexPen  Inject 15 Units into the skin 3 (three) times daily with meals. 09/28/18   Ladell Pier, MD  Insulin Glargine (LANTUS SOLOSTAR) 100 UNIT/ML Solostar Pen Inject 15 Units into the skin daily at 10 pm. 09/28/18   Ladell Pier, MD  Insulin Pen Needle (B-D ULTRAFINE III SHORT PEN) 31G X 8 MM MISC 1 application by Does not apply route 4 (four) times daily. 05/21/18   Ladell Pier, MD  metoCLOPramide (REGLAN) 10 MG tablet Take 1 tablet (10 mg total) by mouth every 6 (six) hours. 04/11/19   Blanchie Dessert, MD  nicotine (NICODERM CQ - DOSED IN MG/24 HOURS) 14 mg/24hr patch Place 1 patch (14 mg total) onto the skin daily. Patient not taking: Reported on  01/21/2019 09/28/18   Ladell Pier, MD  omeprazole (PRILOSEC) 20 MG capsule Take 1 capsule (20 mg total) by mouth daily. 04/11/19   Blanchie Dessert, MD  sildenafil (VIAGRA) 50 MG tablet Take 1 tablet half hour prior to sexual intercourse as needed.  Limit use to 1 tablet / 24 hours 03/03/19   Argentina Donovan, PA-C  triamcinolone cream (KENALOG) 0.1 % Apply 1 application topically 2 (two) times daily. 03/03/19   Argentina Donovan, PA-C    Family History Family History  Problem Relation Age of Onset  . Thyroid disease Mother   . Diabetes Maternal Aunt   . Diabetes Maternal Grandmother   . Diabetes Maternal Grandfather   . Thyroid disease Other     Social History Social History   Tobacco Use  . Smoking status: Current Every Day Smoker    Packs/day: 0.25    Years: 6.00    Pack years: 1.50    Types: Cigarettes  . Smokeless tobacco: Never Used  Substance Use Topics  . Alcohol use: Yes    Comment: social   . Drug use: Yes    Types: Marijuana    Comment: last used 04/17/2016     Allergies   Shellfish allergy   Review of Systems Review of Systems  Constitutional: Negative for fever.  HENT: Negative for ear pain and sore throat.   Eyes: Negative for visual disturbance.  Respiratory: Negative for cough and shortness of breath.   Cardiovascular: Negative for chest pain.  Gastrointestinal: Positive for abdominal pain. Negative for diarrhea, nausea and vomiting.  Genitourinary: Negative for dysuria and hematuria.  Musculoskeletal: Negative for back pain.  Skin: Negative for rash.  Neurological: Negative for headaches.  All other systems reviewed and are negative.   Physical Exam Updated Vital Signs BP 133/84   Pulse (!) 51   Temp 97.8 F (36.6 C) (Oral)   Resp 16   SpO2 99%   Physical Exam Vitals signs and nursing note reviewed.  Constitutional:      General: He is not in acute distress.    Appearance: He is well-developed. He is not ill-appearing or  toxic-appearing.  HENT:     Head: Normocephalic and atraumatic.  Eyes:     Conjunctiva/sclera: Conjunctivae normal.  Neck:     Musculoskeletal: Neck supple.  Cardiovascular:     Rate and Rhythm: Normal rate and regular rhythm.     Heart sounds: Normal heart sounds. No murmur.  Pulmonary:     Effort: Pulmonary effort is normal. No respiratory distress.     Breath sounds: Normal breath sounds. No wheezing, rhonchi or rales.  Abdominal:     General: Bowel sounds are normal.     Palpations: Abdomen is soft.  Tenderness: There is abdominal tenderness in the epigastric area. There is no guarding or rebound.  Skin:    General: Skin is warm and dry.  Neurological:     Mental Status: He is alert.      ED Treatments / Results  Labs (all labs ordered are listed, but only abnormal results are displayed) Labs Reviewed  CBC WITH DIFFERENTIAL/PLATELET - Abnormal; Notable for the following components:      Result Value   WBC 13.4 (*)    Neutro Abs 11.5 (*)    Abs Immature Granulocytes 0.08 (*)    All other components within normal limits  COMPREHENSIVE METABOLIC PANEL - Abnormal; Notable for the following components:   Glucose, Bld 223 (*)    All other components within normal limits  URINALYSIS, ROUTINE W REFLEX MICROSCOPIC - Abnormal; Notable for the following components:   Specific Gravity, Urine >1.046 (*)    Glucose, UA 150 (*)    Ketones, ur 80 (*)    Protein, ur 30 (*)    All other components within normal limits  CBG MONITORING, ED - Abnormal; Notable for the following components:   Glucose-Capillary 212 (*)    All other components within normal limits  URINE CULTURE  LIPASE, BLOOD    EKG None  Radiology Ct Abdomen Pelvis W Contrast  Result Date: 04/11/2019 CLINICAL DATA:  Epigastric abdominal pain. EXAM: CT ABDOMEN AND PELVIS WITH CONTRAST TECHNIQUE: Multidetector CT imaging of the abdomen and pelvis was performed using the standard protocol following bolus  administration of intravenous contrast. CONTRAST:  9m OMNIPAQUE IOHEXOL 300 MG/ML  SOLN COMPARISON:  None. FINDINGS: Lower chest: Small bilateral pleural effusions. No infiltrates. Small nodule in the right middle lobe is likely a lymph node. No pericardial effusion. Hepatobiliary: No focal hepatic lesions or intrahepatic biliary dilatation. The portal and hepatic veins are patent. There is a small amount of fluid around the gallbladder but I do not see any obvious gallstones or wall thickening. There may be slight mucosal enhancement of the gallbladder wall. Pancreas: No mass, inflammation or ductal dilatation. Spleen: Normal size.  No focal lesions. Adrenals/Urinary Tract: The adrenal glands and kidneys are unremarkable. No renal lesions or hydronephrosis. The bladder appears normal. Stomach/Bowel: The stomach, duodenum, small bowel and colon are grossly normal without oral contrast. No acute inflammatory changes, mass lesions or obstructive findings. The terminal ileum and appendix are normal. Vascular/Lymphatic: The aorta is normal in caliber. No dissection. The branch vessels are patent. The major venous structures are patent. No mesenteric or retroperitoneal mass or adenopathy. Scattered mesenteric lymph nodes could suggest mesenteric adenitis. Reproductive: The prostate gland and seminal vesicles are unremarkable. Other: There is a small amount of free pelvic fluid of uncertain significance or etiology. There is also mild hazy interstitial changes in the small bowel mesentery. Musculoskeletal: No significant bony findings. IMPRESSION: 1. Small bilateral pleural effusions. 2. Small amount of free pelvic fluid and mild mesenteric edema. 3. Small amount of fluid around the gallbladder but no obvious gallstones or wall thickening. 4. Small scattered mesenteric lymph nodes could suggest mesenteric adenitis. Electronically Signed   By: PMarijo SanesM.D.   On: 04/11/2019 19:29   Dg Abd Portable 1 View  Result  Date: 04/11/2019 CLINICAL DATA:  Epigastric pain for 2 days EXAM: PORTABLE ABDOMEN - 1 VIEW COMPARISON:  None. FINDINGS: Scattered large and small bowel gas is noted. No obstructive changes are seen. No free air is noted. No abnormal mass or abnormal calcifications are seen. Bony  structures appear within normal limits. IMPRESSION: No acute abnormality noted. Electronically Signed   By: Inez Catalina M.D.   On: 04/11/2019 17:10    Procedures Procedures (including critical care time)  Medications Ordered in ED Medications  alum & mag hydroxide-simeth (MAALOX/MYLANTA) 200-200-20 MG/5ML suspension 30 mL (30 mLs Oral Given 04/12/19 0510)  famotidine (PEPCID) tablet 20 mg (20 mg Oral Given 04/12/19 0510)  ondansetron (ZOFRAN-ODT) disintegrating tablet 4 mg (4 mg Oral Given 04/12/19 0510)  sucralfate (CARAFATE) tablet 1 g (1 g Oral Given 04/12/19 0510)     Initial Impression / Assessment and Plan / ED Course  I have reviewed the triage vital signs and the nursing notes.  Pertinent labs & imaging results that were available during my care of the patient were reviewed by me and considered in my medical decision making (see chart for details).     Final Clinical Impressions(s) / ED Diagnoses   Final diagnoses:  Abdominal pain, unspecified abdominal location   28 y/o male with a h/o T1DM who presents to the ED today for eval of epigastric abd pain. States he has a h/o same and that his is chronic for him but he could not tolerate sxs. He was seen in the ED earlier for similar sxs.  Reviewed prior labs and imaging.   Laboratory work was benign and did not show any evidence of DKA. CT scan with small bilateral pleural effusions. Small amount of free pelvic fluid and mild mesenteric edema. Small amount of fluid around the gallbladder but no obvious gallstones or wall thickening. Small scattered mesenteric lymph nodes could suggest mesenteric adenitis.   Repeat labs obtained Patient has mild  leukocytosis at 12.  No anemia. CMP with elevated blood glucose but normal bicarb.  Normal electrolytes.  No elevated anion gap.  Normal creatinine.  Normal LFTs. Lipase negative UA with ketonuria and proteinuria.  I suspect his ketonuria is related to decreased p.o. intake given his recent nausea vomiting.  I do not think that this patient is in DKA. Urine culture was sent.  On reassessment, patient is sleeping comfortably in bed.  He is easily arousable.  He states that his abdominal pain is completely resolved.  He has been able to tolerate water.  He said no episodes of vomiting while in the ED.  I discussed the results of his laboratory work and need for follow-up with GI.  Will give referral.  He states he will call them this morning to schedule an appointment for follow-up.  I also advised him to get his prescription filled that he received during his visit yesterday and start taking them today.  He agrees to do so.  I have advised that he return to the ER for any new or worsening symptoms.  He voiced understanding of the plan and reasons to return.  All questions answered.  Patient stable for discharge.  ED Discharge Orders    None       Bishop Dublin 04/12/19 [id number]    Ward, Delice Bison, DO 04/12/19 6629

## 2019-04-12 NOTE — ED Notes (Signed)
Pt resting quietly on lobby bench

## 2019-04-12 NOTE — Discharge Instructions (Signed)

## 2019-04-13 LAB — URINE CULTURE: Culture: <10000 — AB

## 2019-05-06 ENCOUNTER — Other Ambulatory Visit: Payer: Self-pay | Admitting: Physician Assistant

## 2019-05-06 DIAGNOSIS — N529 Male erectile dysfunction, unspecified: Secondary | ICD-10-CM

## 2019-05-07 ENCOUNTER — Other Ambulatory Visit: Payer: Self-pay | Admitting: Physician Assistant

## 2019-05-07 DIAGNOSIS — N529 Male erectile dysfunction, unspecified: Secondary | ICD-10-CM

## 2019-05-09 ENCOUNTER — Emergency Department (HOSPITAL_COMMUNITY)
Admission: EM | Admit: 2019-05-09 | Discharge: 2019-05-09 | Disposition: A | Discharge status: Home / Self Care | Payer: Medicaid Other | Attending: Emergency Medicine | Admitting: Emergency Medicine

## 2019-05-09 ENCOUNTER — Encounter (HOSPITAL_COMMUNITY): Payer: Self-pay

## 2019-05-09 DIAGNOSIS — Z794 Long term (current) use of insulin: Secondary | ICD-10-CM | POA: Diagnosis not present

## 2019-05-09 DIAGNOSIS — Z79899 Other long term (current) drug therapy: Secondary | ICD-10-CM | POA: Insufficient documentation

## 2019-05-09 DIAGNOSIS — F17210 Nicotine dependence, cigarettes, uncomplicated: Secondary | ICD-10-CM | POA: Insufficient documentation

## 2019-05-09 DIAGNOSIS — R112 Nausea with vomiting, unspecified: Secondary | ICD-10-CM | POA: Insufficient documentation

## 2019-05-09 DIAGNOSIS — K3184 Gastroparesis: Secondary | ICD-10-CM

## 2019-05-09 DIAGNOSIS — E1043 Type 1 diabetes mellitus with diabetic autonomic (poly)neuropathy: Secondary | ICD-10-CM | POA: Insufficient documentation

## 2019-05-09 LAB — COMPREHENSIVE METABOLIC PANEL
ALT: 20 U/L (ref 0–44)
AST: 21 U/L (ref 15–41)
Albumin: 4.2 g/dL (ref 3.5–5.0)
Alkaline Phosphatase: 102 U/L (ref 38–126)
Anion gap: 10 (ref 5–15)
BUN: 13 mg/dL (ref 6–20)
CO2: 25 mmol/L (ref 22–32)
Calcium: 9.3 mg/dL (ref 8.9–10.3)
Chloride: 106 mmol/L (ref 98–111)
Creatinine, Ser: 0.93 mg/dL (ref 0.61–1.24)
GFR calc Af Amer: >60 mL/min (ref >60)
GFR calc non Af Amer: >60 mL/min (ref >60)
Glucose, Bld: 224 mg/dL — ABNORMAL HIGH (ref 70–99)
Potassium: 3.9 mmol/L (ref 3.5–5.1)
Sodium: 141 mmol/L (ref 135–145)
Total Bilirubin: 0.5 mg/dL (ref 0.3–1.2)
Total Protein: 7.6 g/dL (ref 6.5–8.1)

## 2019-05-09 LAB — CBG MONITORING, ED: Glucose-Capillary: 202 mg/dL — ABNORMAL HIGH (ref 70–99)

## 2019-05-09 LAB — CBC WITH DIFFERENTIAL/PLATELET
Abs Immature Granulocytes: 0.05 10*3/uL (ref 0.00–0.07)
Basophils Absolute: 0 10*3/uL (ref 0.0–0.1)
Basophils Relative: 0 %
Eosinophils Absolute: 0.5 10*3/uL (ref 0.0–0.5)
Eosinophils Relative: 3 %
HCT: 46.2 % (ref 39.0–52.0)
Hemoglobin: 14.8 g/dL (ref 13.0–17.0)
Immature Granulocytes: 0 %
Lymphocytes Relative: 9 %
Lymphs Abs: 1.3 10*3/uL (ref 0.7–4.0)
MCH: 27 pg (ref 26.0–34.0)
MCHC: 32 g/dL (ref 30.0–36.0)
MCV: 84.2 fL (ref 80.0–100.0)
Monocytes Absolute: 0.8 10*3/uL (ref 0.1–1.0)
Monocytes Relative: 6 %
Neutro Abs: 12.4 10*3/uL — ABNORMAL HIGH (ref 1.7–7.7)
Neutrophils Relative %: 82 %
Platelets: 291 10*3/uL (ref 150–400)
RBC: 5.49 MIL/uL (ref 4.22–5.81)
RDW: 13.2 % (ref 11.5–15.5)
WBC: 15 10*3/uL — ABNORMAL HIGH (ref 4.0–10.5)
nRBC: 0 % (ref 0.0–0.2)

## 2019-05-09 LAB — LIPASE, BLOOD: Lipase: 22 U/L (ref 11–51)

## 2019-05-09 MED ORDER — ONDANSETRON 8 MG PO TBDP: ORAL_TABLET | ORAL | 0 refills | Status: DC

## 2019-05-09 MED ORDER — ONDANSETRON HCL 4 MG/2ML IJ SOLN
4.0000 mg | Freq: Once | INTRAMUSCULAR | Status: DC
  Filled 2019-05-09: qty 2

## 2019-05-09 MED ORDER — MORPHINE SULFATE (PF) 4 MG/ML IV SOLN
4.0000 mg | Freq: Once | INTRAVENOUS | Status: AC
  Administered 2019-05-09: 4 mg via INTRAVENOUS
  Filled 2019-05-09: qty 1

## 2019-05-09 MED ORDER — SODIUM CHLORIDE 0.9 % IV BOLUS
1000.0000 mL | Freq: Once | INTRAVENOUS | Status: AC
  Administered 2019-05-09: 1000 mL via INTRAVENOUS

## 2019-05-09 MED ORDER — KETOROLAC TROMETHAMINE 30 MG/ML IJ SOLN
30.0000 mg | Freq: Once | INTRAMUSCULAR | Status: AC
  Administered 2019-05-09: 30 mg via INTRAVENOUS
  Filled 2019-05-09: qty 1

## 2019-05-09 MED ORDER — METOCLOPRAMIDE HCL 5 MG/ML IJ SOLN
10.0000 mg | Freq: Once | INTRAMUSCULAR | Status: AC
  Administered 2019-05-09: 10 mg via INTRAVENOUS
  Filled 2019-05-09: qty 2

## 2019-05-09 NOTE — ED Notes (Signed)
Pt able to tolerate PO fluids.  

## 2019-05-09 NOTE — ED Notes (Signed)
Pt given ginger-ale for PO challence

## 2019-05-09 NOTE — Discharge Instructions (Signed)
Zofran as prescribed as needed for nausea.  Clear liquid diet for the next 12 hours, then slowly advance as tolerated.  Follow-up with gastroenterology.  The contact information for Dr. Collene Mares has been provided in this discharge summary for you to call and make these arrangements.  Return to the emergency department if you develop worsening pain, high fever, bloody stool, or other new and concerning symptoms.

## 2019-05-09 NOTE — ED Notes (Signed)
Unable to log into computer in room.  Pt consent for verbal signature.

## 2019-05-09 NOTE — ED Provider Notes (Signed)
[place] DEPT Provider Note   CSN: [id number] Arrival date & time: 05/09/19  3491     History   Chief Complaint Chief Complaint  Patient presents with  . Hyperglycemia  . Abdominal Pain    HPI Jonathan Porter is a 28 y.o. male.     Patient is a 28 year old male with history of type 1 diabetes.  He presents today for evaluation of abdominal pain and vomiting.  This started earlier this morning.  He reports epigastric cramping and yellow emesis.  He denies any fevers or chills.  He denies any ill contacts.  Patient does have a history of similar episodes in the past over the past few years.  He was recommended to see a GI doctor, however this hasn't happened due to financial reasons.  Patient does admit to daily marijuana use and has done this for a long time.  The history is provided by the patient.  Hyperglycemia Blood sugar level PTA:  300 Severity:  Moderate Onset quality:  Sudden Timing:  Constant Progression:  Worsening Chronicity:  New Associated symptoms: abdominal pain   Abdominal Pain   Past Medical History:  Diagnosis Date  . Diabetes mellitus without complication [facility])     Patient Active Problem List   Diagnosis Date Noted  . Type 1 diabetes mellitus with diabetic polyneuropathy ([place]) 01/21/2019  . Type 1 diabetes mellitus with hyperglycemia ([place]) 01/21/2019  . Paronychia of finger of left hand 01/20/2017  . 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 Diabetes type 1, uncontrolled ([place]) 08/23/2015  . Onychomycosis of toenail 08/23/2015  . Phimosis 08/07/2014    Past Surgical History:  Procedure Laterality Date  . NO PAST SURGERIES          Home Medications    Prior to Admission medications   Medication Sig Start Date End Date Taking? Authorizing Provider  ACCU-CHEK FASTCLIX LANCETS MISC Use to check blood sugar up to 3 times daily. Patient not taking: Reported on 01/21/2019 09/28/18   Ladell Pier, MD  Blood Glucose Monitoring Suppl (ACCU-CHEK GUIDE ME) w/Device KIT 1 kit by Does not apply route 3 (three) times daily. Use to check blood sugar up to 3 times daily. 01/21/19   Shamleffer, Melanie Crazier, MD  dicyclomine (BENTYL) 20 MG tablet Take 1 tablet (20 mg total) by mouth 2 (two) times daily. 04/11/19   Blanchie Dessert, MD  gabapentin (NEURONTIN) 100 MG capsule Take 1 capsule (100 mg total) by mouth 3 (three) times daily. Patient not taking: Reported on 09/28/2018 08/25/18   Gildardo Pounds, NP  glucose blood (ACCU-CHEK GUIDE) test strip 4x a day 01/21/19   Shamleffer, Melanie Crazier, MD  glucose blood (TRUE METRIX BLOOD GLUCOSE TEST) test strip 1 each by Other route 3 (three) times daily. Patient not taking: Reported on 03/03/2019 09/28/18   Ladell Pier, MD  imiquimod Leroy Sea) 5 % cream Apply a thin layer 3 times per week (on alternate days) prior to bedtime; leave on skin for 6 to 10 hours, then remove with mild soap and water. Continue until there is total clearance of the genital/perianal warts or for a maximum duration of therapy of 16 weeks 07/02/18   Ladell Pier, MD  insulin aspart (NOVOLOG) 100 UNIT/ML FlexPen Inject 15 Units into the skin 3 (three) times daily with meals. 09/28/18   Ladell Pier, MD  Insulin Glargine (LANTUS SOLOSTAR) 100 UNIT/ML Solostar Pen Inject 15 Units into the skin daily  at 10 pm. 09/28/18   Ladell Pier, MD  Insulin Pen Needle (B-D ULTRAFINE III SHORT PEN) 31G X 8 MM MISC 1 application by Does not apply route 4 (four) times daily. 05/21/18   Ladell Pier, MD  metoCLOPramide (REGLAN) 10 MG tablet Take 1 tablet (10 mg total) by mouth every 6 (six) hours. 04/11/19   Blanchie Dessert, MD  nicotine (NICODERM CQ - DOSED IN MG/24 HOURS) 14 mg/24hr patch Place 1 patch (14 mg total) onto the skin daily. Patient not taking: Reported on 01/21/2019 09/28/18   Ladell Pier, MD  omeprazole (PRILOSEC) 20 MG capsule Take 1 capsule (20 mg  total) by mouth daily. 04/11/19   Blanchie Dessert, MD  sildenafil (VIAGRA) 50 MG tablet Take 1 tablet half hour prior to sexual intercourse as needed.  Limit use to 1 tablet / 24 hours 03/03/19   Argentina Donovan, PA-C  triamcinolone cream (KENALOG) 0.1 % Apply 1 application topically 2 (two) times daily. 03/03/19   Argentina Donovan, PA-C    Family History Family History  Problem Relation Age of Onset  . Thyroid disease Mother   . Diabetes Maternal Aunt   . Diabetes Maternal Grandmother   . Diabetes Maternal Grandfather   . Thyroid disease Other     Social History Social History   Tobacco Use  . Smoking status: Current Every Day Smoker    Packs/day: 0.25    Years: 6.00    Pack years: 1.50    Types: Cigarettes  . Smokeless tobacco: Never Used  Substance Use Topics  . Alcohol use: Yes    Comment: social   . Drug use: Yes    Types: Marijuana    Comment: last used 04/17/2016     Allergies   Shellfish allergy   Review of Systems Review of Systems  Gastrointestinal: Positive for abdominal pain.  All other systems reviewed and are negative.    Physical Exam Updated Vital Signs BP (!) 143/97   Pulse 77   Temp (!) 97.5 F (36.4 C) (Oral)   Resp 16   Ht 5' 11"  (1.803 m)   Wt 97.5 kg   SpO2 100%   BMI 29.99 kg/m   Physical Exam Vitals signs and nursing note reviewed.  Constitutional:      General: He is not in acute distress.    Appearance: He is well-developed. He is not diaphoretic.  HENT:     Head: Normocephalic and atraumatic.  Neck:     Musculoskeletal: Normal range of motion and neck supple.  Cardiovascular:     Rate and Rhythm: Normal rate and regular rhythm.     Heart sounds: No murmur. No friction rub.  Pulmonary:     Effort: Pulmonary effort is normal. No respiratory distress.     Breath sounds: Normal breath sounds. No wheezing or rales.  Abdominal:     General: Bowel sounds are normal. There is no distension.     Palpations: Abdomen is  soft.     Tenderness: There is abdominal tenderness in the epigastric area. There is no right CVA tenderness, left CVA tenderness, guarding or rebound.  Musculoskeletal: Normal range of motion.  Skin:    General: Skin is warm and dry.  Neurological:     Mental Status: He is alert and oriented to person, place, and time.     Coordination: Coordination normal.      ED Treatments / Results  Labs (all labs ordered are listed, but only abnormal results  are displayed) Labs Reviewed  CBG MONITORING, ED - Abnormal; Notable for the following components:      Result Value   Glucose-Capillary 202 (*)    All other components within normal limits  COMPREHENSIVE METABOLIC PANEL  LIPASE, BLOOD  CBC WITH DIFFERENTIAL/PLATELET    EKG None  Radiology No results found.  Procedures Procedures (including critical care time)  Medications Ordered in ED Medications  sodium chloride 0.9 % bolus 1,000 mL (has no administration in time range)  ketorolac (TORADOL) 30 MG/ML injection 30 mg (has no administration in time range)  morphine 4 MG/ML injection 4 mg (has no administration in time range)  metoCLOPramide (REGLAN) injection 10 mg (has no administration in time range)     Initial Impression / Assessment and Plan / ED Course  I have reviewed the triage vital signs and the nursing notes.  Pertinent labs & imaging results that were available during my care of the patient were reviewed by me and considered in my medical decision making (see chart for details).  Patient with history of type 1 diabetes presenting with complaints of nausea and vomiting.  He has had similar episodes in the past and believed to have gastroparesis.  Patient given medications here in the ER and is feeling better.  Laboratory studies are unremarkable.  Patient able to tolerate liquids and I believe to be appropriate for discharge.  He will be given ODT Zofran and advised to follow-up with GI.  Final Clinical  Impressions(s) / ED Diagnoses   Final diagnoses:  None    ED Discharge Orders    None       Veryl Speak, MD 05/09/19 1222

## 2019-05-09 NOTE — ED Triage Notes (Signed)
Per EMS: Pt from home.  Pt c/o of feeling unwell this am, and vomited.  Last month pt was recommended to see a GI doctor, but has not due to insurance issues.  Pt has not taken his insulin.  Pt c/o of chills, vomiting, nausea.  Pt given 500 cc [place] and 4 mg IV zofran.  Pt's CBG was 357 first, and 291 second.

## 2019-05-27 MED FILL — LANTUS SOLOSTAR 100 UNITS/M: 100 | 90 days supply | Qty: 15 | Fill #1

## 2019-05-27 MED FILL — NOVOLOG FLEXPEN SYRINGE: 100 | 33 days supply | Qty: 15 | Fill #2

## 2019-06-03 ENCOUNTER — Other Ambulatory Visit: Payer: Self-pay

## 2019-06-03 ENCOUNTER — Encounter: Payer: Self-pay | Admitting: Internal Medicine

## 2019-06-03 ENCOUNTER — Ambulatory Visit: Payer: Medicaid Other | Attending: Internal Medicine | Admitting: Internal Medicine

## 2019-06-03 VITALS — BP 130/90 | HR 73 | Temp 98.3°F | Resp 16 | Wt 224.6 lb

## 2019-06-03 DIAGNOSIS — Z833 Family history of diabetes mellitus: Secondary | ICD-10-CM | POA: Diagnosis not present

## 2019-06-03 DIAGNOSIS — E1042 Type 1 diabetes mellitus with diabetic polyneuropathy: Secondary | ICD-10-CM | POA: Insufficient documentation

## 2019-06-03 DIAGNOSIS — N529 Male erectile dysfunction, unspecified: Secondary | ICD-10-CM | POA: Insufficient documentation

## 2019-06-03 DIAGNOSIS — N521 Erectile dysfunction due to diseases classified elsewhere: Secondary | ICD-10-CM | POA: Insufficient documentation

## 2019-06-03 DIAGNOSIS — Z91013 Allergy to seafood: Secondary | ICD-10-CM | POA: Insufficient documentation

## 2019-06-03 DIAGNOSIS — Z79899 Other long term (current) drug therapy: Secondary | ICD-10-CM | POA: Diagnosis not present

## 2019-06-03 DIAGNOSIS — IMO0001 Reserved for inherently not codable concepts without codable children: Secondary | ICD-10-CM

## 2019-06-03 DIAGNOSIS — K3184 Gastroparesis: Secondary | ICD-10-CM | POA: Diagnosis not present

## 2019-06-03 DIAGNOSIS — Z6831 Body mass index (BMI) 31.0-31.9, adult: Secondary | ICD-10-CM | POA: Diagnosis not present

## 2019-06-03 DIAGNOSIS — E1065 Type 1 diabetes mellitus with hyperglycemia: Secondary | ICD-10-CM

## 2019-06-03 DIAGNOSIS — R030 Elevated blood-pressure reading, without diagnosis of hypertension: Secondary | ICD-10-CM | POA: Insufficient documentation

## 2019-06-03 DIAGNOSIS — Z794 Long term (current) use of insulin: Secondary | ICD-10-CM | POA: Insufficient documentation

## 2019-06-03 DIAGNOSIS — E669 Obesity, unspecified: Secondary | ICD-10-CM | POA: Diagnosis not present

## 2019-06-03 DIAGNOSIS — Z2821 Immunization not carried out because of patient refusal: Secondary | ICD-10-CM | POA: Insufficient documentation

## 2019-06-03 DIAGNOSIS — F17200 Nicotine dependence, unspecified, uncomplicated: Secondary | ICD-10-CM

## 2019-06-03 DIAGNOSIS — F17210 Nicotine dependence, cigarettes, uncomplicated: Secondary | ICD-10-CM | POA: Insufficient documentation

## 2019-06-03 LAB — POCT GLYCOSYLATED HEMOGLOBIN (HGB A1C): HbA1c, POC (controlled diabetic range): 8 % — AB (ref 0.0–7.0)

## 2019-06-03 LAB — GLUCOSE, POCT (MANUAL RESULT ENTRY): POC Glucose: 152 mg/dl — AB (ref 70–99)

## 2019-06-03 MED ORDER — ACCU-CHEK GUIDE ME W/DEVICE KIT: 1.0000 | PACK | Freq: Three times a day (TID) | 0 refills | Status: DC

## 2019-06-03 MED ORDER — SILDENAFIL CITRATE 50 MG PO TABS: ORAL_TABLET | ORAL | 5 refills | Status: DC

## 2019-06-03 MED ORDER — ACCU-CHEK GUIDE VI STRP: ORAL_STRIP | 12 refills | Status: DC

## 2019-06-03 MED ORDER — NICOTINE 7 MG/24HR TD PT24: 7.0000 mg | MEDICATED_PATCH | Freq: Every day | TRANSDERMAL | 0 refills | Status: DC

## 2019-06-03 MED ORDER — ACCU-CHEK FASTCLIX LANCETS MISC: 11 refills | Status: DC

## 2019-06-03 MED FILL — ACCU-CHEK GUIDE TEST STRIP: 25 days supply | Qty: 100 | Fill #0

## 2019-06-03 MED FILL — NICOTINE 7 MG/24HR PATCH: 7 | 28 days supply | Qty: 28 | Fill #0

## 2019-06-03 MED FILL — ACCU-CHEK FASTCLIX LANCETS: 34 days supply | Qty: 102 | Fill #0

## 2019-06-03 NOTE — Progress Notes (Signed)
Patient ID: SHANT HENCE, male    DOB: Sep 02, 1991  MRN: [id number]  CC: Diabetes and Hospitalization Follow-up (ED)   Subjective: Rajendra Spiller is a 28 y.o. male who presents for chronic disease management and ER follow-up His concerns today include:  Patient with history of insulin-dependent diabetes, genital warts, tob dep, ED.  Seen in ER 04/2019 with recurrent abdominal pain and N/V.  Patient states that it was the same as previous episodes that he gets intermittently.  Episodes thought to be due to gastroparesis even though gastric emptying study in the past was normal.  Gallbladder ultrasound in the past negative for stones. Referred to [facility] by me on last visit but pt states they decline to schedule him because he has outstanding bill from 10 yrs ago that he was not aware of. -still has Reglan which he takes when he needs to.  Last took about 2 wks ago.   Tob dep:  Nicotine patches did help but did not step down to final stage of 7 mg.  Smoking about 4-5 cig/wk.  Still has 14 mg patches at home.  DIABETES TYPE 2 Last A1C:   Results for orders placed or performed in visit on 06/03/19  POCT glucose (manual entry)  Result Value Ref Range   POC Glucose 152 (A) 70 - 99 mg/dl  POCT glycosylated hemoglobin (Hb A1C)  Result Value Ref Range   Hemoglobin A1C     HbA1c POC (<> result, manual entry)     HbA1c, POC (prediabetic range)     HbA1c, POC (controlled diabetic range) 8.0 (A) 0.0 - 7.0 %    Med Adherence:  [x]  Yes.  Novolog 15 unis TID and Lantus 15 Medication side effects:  []  Yes    [x]  No Home Monitoring?  []  Yes    [x]  No -needs new meter Home glucose results range: Diet Adherence:  Goes and comes.  Some days no appetite.  "Sometimes I just eat what ever I can."  Trying to avoid eating fried foods.  Weakness is fruit juices.  Saw nutritionist in past.  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    Hypoglycemic episodes?: []  Yes    [x]   No Numbness of the feet? []  Yes    [x]  No Retinopathy hx? []  Yes    [x]  No Last eye exam: was called for appt but did not go.  Prefers to have it done at [facility]:   Requests refill on Viagra.  It works well for him. Patient Active Problem List   Diagnosis Date Noted  . Influenza vaccine refused 06/03/2019  . Type 1 diabetes mellitus with diabetic polyneuropathy ([place]) 01/21/2019  . Type 1 diabetes mellitus with hyperglycemia ([place]) 01/21/2019  . Paronychia of finger of left hand 01/20/2017  . 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 Diabetes type 1, uncontrolled ([place]) 08/23/2015  . Onychomycosis of toenail 08/23/2015  . Phimosis 08/07/2014     Current Outpatient Medications on File Prior to Visit  Medication Sig Dispense Refill  . dicyclomine (BENTYL) 20 MG tablet Take 1 tablet (20 mg total) by mouth 2 (two) times daily. 20 tablet 0  . gabapentin (NEURONTIN) 100 MG capsule Take 1 capsule (100 mg total) by mouth 3 (three) times daily. (Patient not taking: Reported on 09/28/2018) 90 capsule 0  . glucose blood (TRUE METRIX BLOOD GLUCOSE TEST) test strip 1 each by Other route 3 (  three) times daily. (Patient not taking: Reported on 03/03/2019) 100 each 12  . imiquimod (ALDARA) 5 % cream Apply a thin layer 3 times per week (on alternate days) prior to bedtime; leave on skin for 6 to 10 hours, then remove with mild soap and water. Continue until there is total clearance of the genital/perianal warts or for a maximum duration of therapy of 16 weeks 12 each 0  . insulin aspart (NOVOLOG) 100 UNIT/ML FlexPen Inject 15 Units into the skin 3 (three) times daily with meals. 15 mL 12  . Insulin Glargine (LANTUS SOLOSTAR) 100 UNIT/ML Solostar Pen Inject 15 Units into the skin daily at 10 pm. 5 pen 12  . Insulin Pen Needle (B-D ULTRAFINE III SHORT PEN) 31G X 8 MM MISC 1 application by Does not apply route 4 (four) times daily. 120 each 11  . metoCLOPramide (REGLAN)  10 MG tablet Take 1 tablet (10 mg total) by mouth every 6 (six) hours. 30 tablet 0  . nicotine (NICODERM CQ - DOSED IN MG/24 HOURS) 14 mg/24hr patch Place 1 patch (14 mg total) onto the skin daily. (Patient not taking: Reported on 01/21/2019) 28 patch 0  . omeprazole (PRILOSEC) 20 MG capsule Take 1 capsule (20 mg total) by mouth daily. 30 capsule 0  . triamcinolone cream (KENALOG) 0.1 % Apply 1 application topically 2 (two) times daily. 30 g 1   No current facility-administered medications on file prior to visit.     Allergies  Allergen Reactions  . Shellfish Allergy Anaphylaxis    Social History   Socioeconomic History  . Marital status: Single    Spouse name: Not on file  . Number of children: Not on file  . Years of education: Not on file  . Highest education level: Not on file  Occupational History  . Not on file  Social Needs  . Financial resource strain: Not on file  . Food insecurity    Worry: Not on file    Inability: Not on file  . Transportation needs    Medical: Not on file    Non-medical: Not on file  Tobacco Use  . Smoking status: Current Every Day Smoker    Packs/day: 0.25    Years: 6.00    Pack years: 1.50    Types: Cigarettes  . Smokeless tobacco: Never Used  Substance and Sexual Activity  . Alcohol use: Yes    Comment: social   . Drug use: Yes    Types: Marijuana    Comment: last used 04/17/2016  . Sexual activity: Yes    Birth control/protection: Condom    Comment: stated used condom 80% of the time   Lifestyle  . Physical activity    Days per week: Not on file    Minutes per session: Not on file  . Stress: Not on file  Relationships  . Social Herbalist on phone: Not on file    Gets together: Not on file    Attends religious service: Not on file    Active member of club or organization: Not on file    Attends meetings of clubs or organizations: Not on file    Relationship status: Not on file  . Intimate partner violence    Fear  of current or ex partner: Not on file    Emotionally abused: Not on file    Physically abused: Not on file    Forced sexual activity: Not on file  Other Topics Concern  .  Not on file  Social History Narrative  . Not on file    Family History  Problem Relation Age of Onset  . Thyroid disease Mother   . Diabetes Maternal Aunt   . Diabetes Maternal Grandmother   . Diabetes Maternal Grandfather   . Thyroid disease Other     Past Surgical History:  Procedure Laterality Date  . NO PAST SURGERIES      ROS: Review of Systems Negative except as stated above  PHYSICAL EXAM: BP 130/90   Pulse 73   Temp 98.3 F (36.8 C) (Oral)   Resp 16   Wt 224 lb 9.6 oz (101.9 kg)   SpO2 99%   BMI 31.33 kg/m   Wt Readings from Last 3 Encounters:  06/03/19 224 lb 9.6 oz (101.9 kg)  05/09/19 215 lb (97.5 kg)  04/11/19 215 lb (97.5 kg)    Physical Exam  General appearance - alert, well appearing, and in no distress Mental status - normal mood, behavior, speech, dress, motor activity, and thought processes Mouth - mucous membranes moist, pharynx normal without lesions Neck - supple, no significant adenopathy Chest - clear to auscultation, no wheezes, rales or rhonchi, symmetric air entry Heart - normal rate, regular rhythm, normal S1, S2, no murmurs, rubs, clicks or gallops Extremities - peripheral pulses normal, no pedal edema, no clubbing or cyanosis Diabetic Foot Exam - Simple   Simple Foot Form Visual Inspection See comments: Yes Sensation Testing Intact to touch and monofilament testing bilaterally: Yes Pulse Check Posterior Tibialis and Dorsalis pulse intact bilaterally: Yes Comments Toenails slightly overgrown.  He has small calluses on the medial aspect of both big toes.     CMP Latest Ref Rng & Units 05/09/2019 04/12/2019 04/11/2019  Glucose 70 - 99 mg/dL 224(H) 223(H) -  BUN 6 - 20 mg/dL 13 7 -  Creatinine 0.61 - 1.24 mg/dL 0.93 1.21 -  Sodium 135 - 145 mmol/L 141 139  143  Potassium 3.5 - 5.1 mmol/L 3.9 3.8 3.7  Chloride 98 - 111 mmol/L 106 103 -  CO2 22 - 32 mmol/L 25 25 -  Calcium 8.9 - 10.3 mg/dL 9.3 9.6 -  Total Protein 6.5 - 8.1 g/dL 7.6 8.0 -  Total Bilirubin 0.3 - 1.2 mg/dL 0.5 0.7 -  Alkaline Phos 38 - 126 U/L 102 115 -  AST 15 - 41 U/L 21 24 -  ALT 0 - 44 U/L 20 28 -   Lipid Panel     Component Value Date/Time   CHOL (H) 08/31/2007 0440    339        ATP III CLASSIFICATION:  <200     mg/dL   Desirable  200-239  mg/dL   Borderline High  >=240    mg/dL   High   TRIG 260 (H) 08/31/2007 0440   HDL 39 08/31/2007 0440   CHOLHDL 8.7 08/31/2007 0440   VLDL 52 (H) 08/31/2007 0440   LDLCALC (H) 08/31/2007 0440    248        Total Cholesterol/HDL:CHD Risk Coronary Heart Disease Risk Table                     Men   Women  1/2 Average Risk   3.4   3.3    CBC    Component Value Date/Time   WBC 15.0 (H) 05/09/2019 1006   RBC 5.49 05/09/2019 1006   HGB 14.8 05/09/2019 1006   HCT 46.2 05/09/2019 1006   PLT  291 05/09/2019 1006   MCV 84.2 05/09/2019 1006   MCH 27.0 05/09/2019 1006   MCHC 32.0 05/09/2019 1006   RDW 13.2 05/09/2019 1006   LYMPHSABS 1.3 05/09/2019 1006   MONOABS 0.8 05/09/2019 1006   EOSABS 0.5 05/09/2019 1006   BASOSABS 0.0 05/09/2019 1006    ASSESSMENT AND PLAN: 1. Diabetes mellitus type 1, uncontrolled, without complications (HCC) Dietary counseling given.  Encouraged him to eliminate sugary drinks from his diet.  Discussed the importance of getting in some form of moderate intensity exercise at least 3 to 4 days a week for 30 minutes.  Discussed complications of uncontrolled diabetes.  Encouraged him to get his eye exam done. -No changes made to dose of Lantus and NovoLog since we do not have blood sugar readings to go on.  I have sent prescription to his pharmacy for diabetic testing supplies.  I have requested that he checks his blood sugar at least twice a day before meals and bring in readings on our next visit in 2  to 3 weeks - POCT glucose (manual entry) - POCT glycosylated hemoglobin (Hb A1C) - Microalbumin / creatinine urine ratio - Ambulatory referral to Ophthalmology - Accu-Chek FastClix Lancets MISC; Use to check blood sugar up to 3 times daily.  Dispense: 102 each; Refill: 11 - glucose blood (ACCU-CHEK GUIDE) test strip; 4x a day  Dispense: 150 each; Refill: 12 - Blood Glucose Monitoring Suppl (ACCU-CHEK GUIDE ME) w/Device KIT; 1 kit by Does not apply route 3 (three) times daily. Use to check blood sugar up to 3 times daily.  Dispense: 1 kit; Refill: 0  2. Obesity (BMI 30-39.9) See #1 above  3. Erectile dysfunction, unspecified erectile dysfunction type - sildenafil (VIAGRA) 50 MG tablet; TAKE 1 TABLET BY MOUTH A HALF HOUR PRIOR TO SEXUAL INTERCOURSE AS NEEDED. *LIMIT TO 1 TABLET PER 24 HOURS*  Dispense: 10 tablet; Refill: 5  4. Elevated blood pressure reading DASH diet discussed and encouraged.  We will plan to recheck his blood pressure on follow-up visit.  If still elevated we will start medication  5. Tobacco dependence Advised to quit.  Commended him on cutting back.  He wants to quit.  He found the nicotine patches useful.  He still has some of the 14 mg patches at home.  Advised him to use consistently.  Once he is done with the 14 mg patch she can step down to the 7 mg patches.  Less than 5 minutes spent on counseling - nicotine (NICODERM CQ - DOSED IN MG/24 HR) 7 mg/24hr patch; Place 1 patch (7 mg total) onto the skin daily.  Dispense: 28 patch; Refill: 0  6. Influenza vaccine refused   7. Gastroparesis Patient has intermittent recurrent episodes of nausea and vomiting that seem consistent with gastroparesis even though gastric emptying study was negative.  Other possibility is cyclic vomiting associated with marijuana use.  He will continue to use the Reglan as needed.     Patient was given the opportunity to ask questions.  Patient verbalized understanding of the plan and was  able to repeat key elements of the plan.   Orders Placed This Encounter  Procedures  . Microalbumin / creatinine urine ratio  . Ambulatory referral to Ophthalmology  . POCT glucose (manual entry)  . POCT glycosylated hemoglobin (Hb A1C)     Requested Prescriptions   Signed Prescriptions Disp Refills  . nicotine (NICODERM CQ - DOSED IN MG/24 HR) 7 mg/24hr patch 28 patch 0  Sig: Place 1 patch (7 mg total) onto the skin daily.  . sildenafil (VIAGRA) 50 MG tablet 10 tablet 5    Sig: TAKE 1 TABLET BY MOUTH A HALF HOUR PRIOR TO SEXUAL INTERCOURSE AS NEEDED. *LIMIT TO 1 TABLET PER 24 HOURS*  . Accu-Chek FastClix Lancets MISC 102 each 11    Sig: Use to check blood sugar up to 3 times daily.  Marland Kitchen glucose blood (ACCU-CHEK GUIDE) test strip 150 each 12    Sig: 4x a day  . Blood Glucose Monitoring Suppl (ACCU-CHEK GUIDE ME) w/Device KIT 1 kit 0    Sig: 1 kit by Does not apply route 3 (three) times daily. Use to check blood sugar up to 3 times daily.    Return in about 2 weeks (around 06/17/2019) for recheck DM and BP - in person.  Karle Plumber, MD, FACP

## 2019-06-03 NOTE — Progress Notes (Signed)
Pt is needing a new glucometer

## 2019-06-03 NOTE — Patient Instructions (Signed)
Your blood pressure is elevated today.  We will plan to recheck your blood pressure again on your follow-up visit in 2 weeks.  Try to cut back on salt in the foods.  Your A1c is 8.  This means that your diabetes is not well controlled.  Please try to check your blood sugars at least twice a day before meals and bring in those readings with you in 2 weeks.  I have referred you to the eye doctor to have your diabetic eye exam done.

## 2019-06-04 LAB — MICROALBUMIN / CREATININE URINE RATIO
Creatinine, Urine: 202.4 mg/dL
Microalb/Creat Ratio: 5 mg/g creat (ref 0–29)
Microalbumin, Urine: 10.2 ug/mL

## 2019-06-23 ENCOUNTER — Encounter (HOSPITAL_COMMUNITY): Payer: Self-pay

## 2019-06-23 ENCOUNTER — Emergency Department (HOSPITAL_COMMUNITY)
Admission: EM | Admit: 2019-06-23 | Discharge: 2019-06-24 | Disposition: A | Discharge status: Home / Self Care | Payer: Medicaid Other | Attending: Emergency Medicine | Admitting: Emergency Medicine

## 2019-06-23 DIAGNOSIS — F17210 Nicotine dependence, cigarettes, uncomplicated: Secondary | ICD-10-CM | POA: Diagnosis not present

## 2019-06-23 DIAGNOSIS — E1043 Type 1 diabetes mellitus with diabetic autonomic (poly)neuropathy: Secondary | ICD-10-CM | POA: Insufficient documentation

## 2019-06-23 DIAGNOSIS — E1143 Type 2 diabetes mellitus with diabetic autonomic (poly)neuropathy: Secondary | ICD-10-CM

## 2019-06-23 DIAGNOSIS — Z79899 Other long term (current) drug therapy: Secondary | ICD-10-CM | POA: Insufficient documentation

## 2019-06-23 DIAGNOSIS — R112 Nausea with vomiting, unspecified: Secondary | ICD-10-CM | POA: Diagnosis present

## 2019-06-23 DIAGNOSIS — K3184 Gastroparesis: Secondary | ICD-10-CM | POA: Diagnosis not present

## 2019-06-23 LAB — CBC
HCT: 43.1 % (ref 39.0–52.0)
Hemoglobin: 14 g/dL (ref 13.0–17.0)
MCH: 26.4 pg (ref 26.0–34.0)
MCHC: 32.5 g/dL (ref 30.0–36.0)
MCV: 81.2 fL (ref 80.0–100.0)
Platelets: 291 10*3/uL (ref 150–400)
RBC: 5.31 MIL/uL (ref 4.22–5.81)
RDW: 12.7 % (ref 11.5–15.5)
WBC: 9.1 10*3/uL (ref 4.0–10.5)
nRBC: 0 % (ref 0.0–0.2)

## 2019-06-23 LAB — COMPREHENSIVE METABOLIC PANEL
ALT: 18 U/L (ref 0–44)
AST: 17 U/L (ref 15–41)
Albumin: 3.8 g/dL (ref 3.5–5.0)
Alkaline Phosphatase: 95 U/L (ref 38–126)
Anion gap: 10 (ref 5–15)
BUN: 10 mg/dL (ref 6–20)
CO2: 18 mmol/L — ABNORMAL LOW (ref 22–32)
Calcium: 8.2 mg/dL — ABNORMAL LOW (ref 8.9–10.3)
Chloride: 111 mmol/L (ref 98–111)
Creatinine, Ser: 0.77 mg/dL (ref 0.61–1.24)
GFR calc Af Amer: >60 mL/min (ref >60)
GFR calc non Af Amer: >60 mL/min (ref >60)
Glucose, Bld: 242 mg/dL — ABNORMAL HIGH (ref 70–99)
Potassium: 3.4 mmol/L — ABNORMAL LOW (ref 3.5–5.1)
Sodium: 139 mmol/L (ref 135–145)
Total Bilirubin: 0.5 mg/dL (ref 0.3–1.2)
Total Protein: 6.8 g/dL (ref 6.5–8.1)

## 2019-06-23 LAB — URINALYSIS, ROUTINE W REFLEX MICROSCOPIC
Bilirubin Urine: NEGATIVE
Glucose, UA: 50 mg/dL — AB
Hgb urine dipstick: NEGATIVE
Ketones, ur: 80 mg/dL — AB
Leukocytes,Ua: NEGATIVE
Nitrite: NEGATIVE
Protein, ur: NEGATIVE mg/dL
Specific Gravity, Urine: 1.03 (ref 1.005–1.030)
pH: 5 (ref 5.0–8.0)

## 2019-06-23 LAB — LIPASE, BLOOD: Lipase: 19 U/L (ref 11–51)

## 2019-06-23 MED ORDER — LACTATED RINGERS IV BOLUS
1000.0000 mL | Freq: Once | INTRAVENOUS | Status: AC
  Administered 2019-06-24: 1000 mL via INTRAVENOUS

## 2019-06-23 MED ORDER — HYDROMORPHONE HCL 1 MG/ML IJ SOLN
1.0000 mg | Freq: Once | INTRAMUSCULAR | Status: AC
  Administered 2019-06-24: 1 mg via INTRAVENOUS
  Filled 2019-06-23: qty 1

## 2019-06-23 MED ORDER — METOCLOPRAMIDE HCL 5 MG/ML IJ SOLN
10.0000 mg | Freq: Once | INTRAMUSCULAR | Status: AC
  Administered 2019-06-24: 10 mg via INTRAVENOUS
  Filled 2019-06-23: qty 2

## 2019-06-23 MED ORDER — PANTOPRAZOLE SODIUM 40 MG IV SOLR
40.0000 mg | Freq: Once | INTRAVENOUS | Status: AC
  Administered 2019-06-24: 40 mg via INTRAVENOUS
  Filled 2019-06-23: qty 40

## 2019-06-23 MED ORDER — ONDANSETRON HCL 4 MG/2ML IJ SOLN: 4.0000 mg | Freq: Once | INTRAMUSCULAR | Status: DC | PRN

## 2019-06-23 MED ORDER — HALOPERIDOL LACTATE 5 MG/ML IJ SOLN
5.0000 mg | Freq: Once | INTRAMUSCULAR | Status: AC
  Administered 2019-06-23: 5 mg via INTRAVENOUS
  Filled 2019-06-23: qty 1

## 2019-06-23 MED ORDER — LACTATED RINGERS IV BOLUS
1000.0000 mL | Freq: Once | INTRAVENOUS | Status: AC
  Administered 2019-06-23: 21:00:00 1000 mL via INTRAVENOUS

## 2019-06-23 NOTE — ED Provider Notes (Signed)
[facility] DEPT Provider Note   CSN: [id number] Arrival date & time: 06/23/19  1911     History   Chief Complaint No chief complaint on file.   HPI Jonathan Porter is a 28 y.o. male.     Patient is a 28 year old male with a history of diabetes presenting today with nausea and vomiting and abdominal pain for the last 12 hours.  Patient states over the last year he has had recurrent issues with abdominal pain, vomiting.  He is taking his insulin and blood sugar today was in the 200s.  He has had no diarrhea, fever, cough or shortness of breath.  He has never been diagnosed with gastroparesis and does admit to smoking marijuana regularly.  He denies any alcohol use.  He has had a significant amount of weight loss in the last year due to his GI issues.  The history is provided by the patient.    Past Medical History:  Diagnosis Date  . Diabetes mellitus without complication [facility])     Patient Active Problem List   Diagnosis Date Noted  . Influenza vaccine refused 06/03/2019  . Erectile dysfunction 06/03/2019  . Obesity (BMI 30-39.9) 06/03/2019  . Type 1 diabetes mellitus with diabetic polyneuropathy ([place]) 01/21/2019  . Type 1 diabetes mellitus with hyperglycemia ([place]) 01/21/2019  . Paronychia of finger of left hand 01/20/2017  . 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 Diabetes type 1, uncontrolled ([place]) 08/23/2015  . Onychomycosis of toenail 08/23/2015  . Phimosis 08/07/2014    Past Surgical History:  Procedure Laterality Date  . NO PAST SURGERIES          Home Medications    Prior to Admission medications   Medication Sig Start Date End Date Taking? Authorizing Provider  Accu-Chek FastClix Lancets MISC Use to check blood sugar up to 3 times daily. 06/03/19   Ladell Pier, MD  Blood Glucose Monitoring Suppl (ACCU-CHEK GUIDE ME) w/Device KIT 1 kit by Does not apply route 3 (three) times daily. Use to check  blood sugar up to 3 times daily. 06/03/19   Ladell Pier, MD  dicyclomine (BENTYL) 20 MG tablet Take 1 tablet (20 mg total) by mouth 2 (two) times daily. 04/11/19   Blanchie Dessert, MD  gabapentin (NEURONTIN) 100 MG capsule Take 1 capsule (100 mg total) by mouth 3 (three) times daily. Patient not taking: Reported on 09/28/2018 08/25/18   Gildardo Pounds, NP  glucose blood (ACCU-CHEK GUIDE) test strip 4x a day 06/03/19   Ladell Pier, MD  glucose blood (TRUE METRIX BLOOD GLUCOSE TEST) test strip 1 each by Other route 3 (three) times daily. Patient not taking: Reported on 03/03/2019 09/28/18   Ladell Pier, MD  imiquimod [place]) 5 % cream Apply a thin layer 3 times per week (on alternate days) prior to bedtime; leave on skin for 6 to 10 hours, then remove with mild soap and water. Continue until there is total clearance of the genital/perianal warts or for a maximum duration of therapy of 16 weeks 07/02/18   Ladell Pier, MD  insulin aspart (NOVOLOG) 100 UNIT/ML FlexPen Inject 15 Units into the skin 3 (three) times daily with meals. 09/28/18   Ladell Pier, MD  Insulin Glargine (LANTUS SOLOSTAR) 100 UNIT/ML Solostar Pen Inject 15 Units into the skin daily at 10 pm. 09/28/18   Ladell Pier, MD  Insulin Pen Needle (B-D ULTRAFINE III SHORT PEN) 31G X 8  MM MISC 1 application by Does not apply route 4 (four) times daily. 05/21/18   Ladell Pier, MD  metoCLOPramide (REGLAN) 10 MG tablet Take 1 tablet (10 mg total) by mouth every 6 (six) hours. 04/11/19   Blanchie Dessert, MD  nicotine (NICODERM CQ - DOSED IN MG/24 HOURS) 14 mg/24hr patch Place 1 patch (14 mg total) onto the skin daily. Patient not taking: Reported on 01/21/2019 09/28/18   Ladell Pier, MD  nicotine (NICODERM CQ - DOSED IN MG/24 HR) 7 mg/24hr patch Place 1 patch (7 mg total) onto the skin daily. 06/03/19   Ladell Pier, MD  omeprazole (PRILOSEC) 20 MG capsule Take 1 capsule (20 mg total) by mouth daily.  04/11/19   Blanchie Dessert, MD  sildenafil (VIAGRA) 50 MG tablet TAKE 1 TABLET BY MOUTH A HALF HOUR PRIOR TO SEXUAL INTERCOURSE AS NEEDED. *LIMIT TO 1 TABLET PER 24 HOURS* 06/03/19   Ladell Pier, MD  triamcinolone cream (KENALOG) 0.1 % Apply 1 application topically 2 (two) times daily. 03/03/19   Argentina Donovan, PA-C    Family History Family History  Problem Relation Age of Onset  . Thyroid disease Mother   . Diabetes Maternal Aunt   . Diabetes Maternal Grandmother   . Diabetes Maternal Grandfather   . Thyroid disease Other     Social History Social History   Tobacco Use  . Smoking status: Current Every Day Smoker    Packs/day: 0.25    Years: 6.00    Pack years: 1.50    Types: Cigarettes  . Smokeless tobacco: Never Used  Substance Use Topics  . Alcohol use: Yes    Comment: social   . Drug use: Yes    Types: Marijuana    Comment: last used 04/17/2016     Allergies   Shellfish allergy   Review of Systems Review of Systems  All other systems reviewed and are negative.    Physical Exam Updated Vital Signs BP (!) 126/97 (BP Location: Left Arm)   Pulse 64   Temp 98.3 F (36.8 C) (Oral)   Resp (!) 24   Ht 6' (1.829 m)   Wt 99.8 kg   SpO2 100%   BMI 29.84 kg/m   Physical Exam Vitals signs and nursing note reviewed.  Constitutional:      General: He is not in acute distress.    Appearance: He is well-developed and normal weight.     Comments: Patient appears uncomfortable, rocking and moaning in the bed.  HENT:     Head: Normocephalic and atraumatic.  Eyes:     Conjunctiva/sclera: Conjunctivae normal.     Pupils: Pupils are equal, round, and reactive to light.  Neck:     Musculoskeletal: Normal range of motion and neck supple.  Cardiovascular:     Rate and Rhythm: Normal rate and regular rhythm.     Heart sounds: No murmur.  Pulmonary:     Effort: Pulmonary effort is normal. No respiratory distress.     Breath sounds: Normal breath sounds. No  wheezing or rales.  Abdominal:     General: There is no distension.     Palpations: Abdomen is soft.     Tenderness: There is abdominal tenderness. There is no guarding or rebound.     Comments: Diffuse tenderness throughout the abdomen  Musculoskeletal: Normal range of motion.        General: No tenderness.  Skin:    General: Skin is warm and dry.  Findings: No erythema or rash.  Neurological:     General: No focal deficit present.     Mental Status: He is alert and oriented to person, place, and time.  Psychiatric:        Mood and Affect: Mood normal.        Behavior: Behavior normal.        Thought Content: Thought content normal.      ED Treatments / Results  Labs (all labs ordered are listed, but only abnormal results are displayed) Labs Reviewed  COMPREHENSIVE METABOLIC PANEL - Abnormal; Notable for the following components:      Result Value   Potassium 3.4 (*)    CO2 18 (*)    Glucose, Bld 242 (*)    Calcium 8.2 (*)    All other components within normal limits  URINALYSIS, ROUTINE W REFLEX MICROSCOPIC - Abnormal; Notable for the following components:   Color, Urine AMBER (*)    Glucose, UA 50 (*)    Ketones, ur 80 (*)    All other components within normal limits  LIPASE, BLOOD  CBC    EKG None  Radiology No results found.  Procedures Procedures (including critical care time)  Medications Ordered in ED Medications  ondansetron (ZOFRAN) injection 4 mg (has no administration in time range)  lactated ringers bolus 1,000 mL (1,000 mLs Intravenous New Bag/Given (Non-Interop) 06/23/19 2055)  haloperidol lactate (HALDOL) injection 5 mg (5 mg Intravenous Given 06/23/19 2055)     Initial Impression / Assessment and Plan / ED Course  I have reviewed the triage vital signs and the nursing notes.  Pertinent labs & imaging results that were available during my care of the patient were reviewed by me and considered in my medical decision making (see chart for  details).        Patient presenting with recurrent abdominal pain and vomiting that started today.  He is a known diabetic but no history of gastroparesis.  Patient's vital signs are stable but he appears to be very uncomfortable and continues to vomit in the room.  He also does smoke marijuana fairly regularly and concern for hyperemesis syndrome.  Patient's labs today with no evidence of DKA.  Low suspicion for an acute abdomen.  Patient's blood sugar is 242 today and anion gap of 10.  He does have greater than 80 ketones in his urine but a normal lipase and LFTs.  CBC without acute findings.  Patient given IV fluids and Haldol for his symptoms.  Prior EKG shows no evidence of QT prolongation.  11:18 PM No further vomiting after Haldol but still having some abdominal pain.  Patient was given Protonix, 1 dose of pain medicine and a second liter of fluid given the greater than 80 ketones in his urine.  Will need to p.o. challenge.  Final Clinical Impressions(s) / ED Diagnoses   Final diagnoses:  None    ED Discharge Orders    None       Blanchie Dessert, MD 06/24/19 1637

## 2019-06-23 NOTE — ED Triage Notes (Signed)
Pt comes of abd pain, nausea and vomiting for 2 hours

## 2019-06-23 NOTE — ED Notes (Signed)
Patient throwing emesis bags provided on the ground. Continuing to vomit on the floor.

## 2019-06-23 NOTE — ED Triage Notes (Signed)
EMS gave patient 8 mg zofran in route

## 2019-06-23 NOTE — ED Notes (Signed)
Patient vomiting in the sink.

## 2019-06-23 NOTE — ED Notes (Signed)
Per NT, patient removed IV and is vomiting on the floor.

## 2019-06-23 NOTE — ED Notes (Signed)
ED Provider at bedside. 

## 2019-06-24 MED ORDER — ONDANSETRON 4 MG PO TBDP: 4.0000 mg | ORAL_TABLET | Freq: Three times a day (TID) | ORAL | 0 refills | Status: DC | PRN

## 2019-06-24 NOTE — ED Provider Notes (Signed)
12:38 AM Patient here with diabetic gastroparesis.  Anion gap is 10.  Doubt DKA.  Feeling improved after fluids and medications.  Tolerating oral intake.  Agreeable with plan for discharge to home.  Return precautions given.   Montine Circle, PA-C 06/24/19 [id number]    Blanchie Dessert, MD 06/24/19 1623

## 2019-06-25 ENCOUNTER — Encounter (HOSPITAL_COMMUNITY): Payer: Self-pay | Admitting: *Deleted

## 2019-06-25 ENCOUNTER — Emergency Department (HOSPITAL_COMMUNITY)
Admission: EM | Admit: 2019-06-25 | Discharge: 2019-06-26 | Disposition: A | Discharge status: Home / Self Care | Payer: Medicaid Other | Attending: Emergency Medicine | Admitting: Emergency Medicine

## 2019-06-25 ENCOUNTER — Other Ambulatory Visit: Payer: Self-pay

## 2019-06-25 DIAGNOSIS — Z794 Long term (current) use of insulin: Secondary | ICD-10-CM | POA: Insufficient documentation

## 2019-06-25 DIAGNOSIS — F17210 Nicotine dependence, cigarettes, uncomplicated: Secondary | ICD-10-CM | POA: Diagnosis not present

## 2019-06-25 DIAGNOSIS — E1043 Type 1 diabetes mellitus with diabetic autonomic (poly)neuropathy: Secondary | ICD-10-CM | POA: Diagnosis not present

## 2019-06-25 DIAGNOSIS — R1110 Vomiting, unspecified: Secondary | ICD-10-CM | POA: Diagnosis present

## 2019-06-25 DIAGNOSIS — Z9119 Patient's noncompliance with other medical treatment and regimen: Secondary | ICD-10-CM | POA: Insufficient documentation

## 2019-06-25 DIAGNOSIS — E1143 Type 2 diabetes mellitus with diabetic autonomic (poly)neuropathy: Secondary | ICD-10-CM

## 2019-06-25 DIAGNOSIS — K3184 Gastroparesis: Secondary | ICD-10-CM

## 2019-06-25 LAB — CBC
HCT: 47.2 % (ref 39.0–52.0)
Hemoglobin: 15.9 g/dL (ref 13.0–17.0)
MCH: 27 pg (ref 26.0–34.0)
MCHC: 33.7 g/dL (ref 30.0–36.0)
MCV: 80.1 fL (ref 80.0–100.0)
Platelets: 352 10*3/uL (ref 150–400)
RBC: 5.89 MIL/uL — ABNORMAL HIGH (ref 4.22–5.81)
RDW: 12.9 % (ref 11.5–15.5)
WBC: 10.7 10*3/uL — ABNORMAL HIGH (ref 4.0–10.5)
nRBC: 0 % (ref 0.0–0.2)

## 2019-06-25 LAB — COMPREHENSIVE METABOLIC PANEL
ALT: 18 U/L (ref 0–44)
AST: 17 U/L (ref 15–41)
Albumin: 5 g/dL (ref 3.5–5.0)
Alkaline Phosphatase: 122 U/L (ref 38–126)
Anion gap: 15 (ref 5–15)
BUN: 12 mg/dL (ref 6–20)
CO2: 22 mmol/L (ref 22–32)
Calcium: 10 mg/dL (ref 8.9–10.3)
Chloride: 101 mmol/L (ref 98–111)
Creatinine, Ser: 1.02 mg/dL (ref 0.61–1.24)
GFR calc Af Amer: >60 mL/min (ref >60)
GFR calc non Af Amer: >60 mL/min (ref >60)
Glucose, Bld: 335 mg/dL — ABNORMAL HIGH (ref 70–99)
Potassium: 3.8 mmol/L (ref 3.5–5.1)
Sodium: 138 mmol/L (ref 135–145)
Total Bilirubin: 0.8 mg/dL (ref 0.3–1.2)
Total Protein: 8.6 g/dL — ABNORMAL HIGH (ref 6.5–8.1)

## 2019-06-25 LAB — LIPASE, BLOOD: Lipase: 19 U/L (ref 11–51)

## 2019-06-25 MED ORDER — HALOPERIDOL LACTATE 5 MG/ML IJ SOLN
5.0000 mg | Freq: Once | INTRAMUSCULAR | Status: AC
  Administered 2019-06-25: 5 mg via INTRAVENOUS
  Filled 2019-06-25: qty 1

## 2019-06-25 MED ORDER — ONDANSETRON 4 MG PO TBDP
4.0000 mg | ORAL_TABLET | Freq: Once | ORAL | Status: AC | PRN
  Administered 2019-06-25: 4 mg via ORAL
  Filled 2019-06-25: qty 1

## 2019-06-25 MED ORDER — SODIUM CHLORIDE 0.9 % IV BOLUS
1000.0000 mL | Freq: Once | INTRAVENOUS | Status: AC
  Administered 2019-06-25: 20:00:00 1000 mL via INTRAVENOUS

## 2019-06-25 MED ORDER — PANTOPRAZOLE SODIUM 40 MG IV SOLR
40.0000 mg | Freq: Once | INTRAVENOUS | Status: AC
  Administered 2019-06-25: 40 mg via INTRAVENOUS
  Filled 2019-06-25: qty 40

## 2019-06-25 MED ORDER — LACTATED RINGERS IV BOLUS
1000.0000 mL | Freq: Once | INTRAVENOUS | Status: AC
  Administered 2019-06-25: 1000 mL via INTRAVENOUS

## 2019-06-25 MED ORDER — SODIUM CHLORIDE 0.9% FLUSH
3.0000 mL | Freq: Once | INTRAVENOUS | Status: AC
  Administered 2019-06-25: 3 mL via INTRAVENOUS

## 2019-06-25 NOTE — ED Notes (Signed)
Patient refusing to wear a mask. Will obtain labs when patient wears a mask. Triage RN made aware.

## 2019-06-25 NOTE — ED Provider Notes (Signed)
[facility] DEPT Provider Note   CSN: [id number] Arrival date & time: 06/25/19  1215     History   Chief Complaint No chief complaint on file.   HPI Jonathan Porter is a 28 y.o. male.     28 y.o male with a PMH of DM, gastroparesis presents to the ED with a chief complaint of vomiting, abdominal pain. He reports pain throughout his whole abdomen without focal point of tenderness. Reports he's had several episodes of non bilious, non bloody emesis for the past 3 days. Patient was seen in the ED yesterday, had blood work and improvement in symptoms after haldol.  Patient was discharged home with a prescription of Zofran, he reports he has been unable to pick up this prescription at the pharmacy.  Patient reports his pain throughout his belly feels worse on a day before.  He does currently report history of marijuana use, reports last used 2 days ago.  He also reports he has not been checking his blood glucose, has not been taking his insulin as prescribed.  He denies any fevers, diarrhea, chest pain or shortness of breath.    The history is provided by the patient.    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 Authorizing Provider  dicyclomine (BENTYL) 20 MG tablet Take 1 tablet (20 mg total) by mouth 2 (two) times daily. 04/11/19  Yes Plunkett, Loree Fee, MD  insulin aspart (NOVOLOG) 100 UNIT/ML FlexPen Inject 15 Units into the skin 3 (three) times daily with meals. 09/28/18  Yes Ladell Pier, MD  Insulin Glargine (LANTUS SOLOSTAR) 100 UNIT/ML Solostar Pen Inject 15 Units into the skin daily at 10 pm. 09/28/18  Yes Ladell Pier, MD  sildenafil (VIAGRA) 50 MG tablet TAKE 1 TABLET BY MOUTH A HALF HOUR PRIOR TO SEXUAL INTERCOURSE AS NEEDED. *LIMIT TO 1 TABLET PER 24 HOURS* 06/03/19  Yes Ladell Pier, MD  Accu-Chek FastClix Lancets MISC Use to check blood sugar up to 3 times daily. 06/03/19   Ladell Pier, MD  Blood Glucose Monitoring Suppl (ACCU-CHEK GUIDE ME) w/Device KIT 1 kit by Does not apply route 3 (three) times daily. Use to check blood sugar up to 3 times daily. 06/03/19   Ladell Pier, MD  gabapentin (NEURONTIN) 100 MG capsule Take 1 capsule (100 mg total) by mouth 3 (three) times daily. Patient not taking: Reported on 09/28/2018 08/25/18   Gildardo Pounds, NP  glucose blood (ACCU-CHEK GUIDE) test strip 4x a day 06/03/19   Ladell Pier, MD  glucose blood (TRUE METRIX BLOOD GLUCOSE TEST) test strip 1 each by Other route 3 (three) times daily. Patient not taking: Reported on 03/03/2019 09/28/18  Ladell Pier, MD  imiquimod [place]) 5 % cream Apply a thin layer 3 times per week (on alternate days) prior to bedtime; leave on skin for 6 to 10 hours, then remove with mild soap and water. Continue until there is total clearance of the genital/perianal warts or for a maximum duration of therapy of 16 weeks Patient not taking: Reported on 06/23/2019 07/02/18   Ladell Pier, MD  Insulin Pen Needle (B-D ULTRAFINE III SHORT PEN) 31G X 8 MM MISC 1 application by Does not apply route 4 (four) times daily. 05/21/18   Ladell Pier, MD  metoCLOPramide (REGLAN) 10 MG tablet  Take 1 tablet (10 mg total) by mouth every 6 (six) hours. Patient not taking: Reported on 06/23/2019 04/11/19   Blanchie Dessert, MD  nicotine (NICODERM CQ - DOSED IN MG/24 HOURS) 14 mg/24hr patch Place 1 patch (14 mg total) onto the skin daily. Patient not taking: Reported on 01/21/2019 09/28/18   Ladell Pier, MD  nicotine (NICODERM CQ - DOSED IN MG/24 HR) 7 mg/24hr patch Place 1 patch (7 mg total) onto the skin daily. Patient not taking: Reported on 06/23/2019 06/03/19   Ladell Pier, MD  omeprazole (PRILOSEC) 20 MG capsule Take 1 capsule (20 mg total) by mouth daily. Patient not taking: Reported on 06/23/2019 04/11/19   Blanchie Dessert, MD  ondansetron (ZOFRAN ODT) 4 MG disintegrating tablet Take 1 tablet (4 mg total) by mouth every 8 (eight) hours as needed for nausea or vomiting. 06/24/19   Montine Circle, PA-C  triamcinolone cream (KENALOG) 0.1 % Apply 1 application topically 2 (two) times daily. Patient not taking: Reported on 06/23/2019 03/03/19   Argentina Donovan, PA-C    Family History Family History  Problem Relation Age of Onset   Thyroid disease Mother    Diabetes Maternal Aunt    Diabetes Maternal Grandmother    Diabetes Maternal Grandfather    Thyroid disease Other     Social History Social History   Tobacco Use   Smoking status: Current Every Day Smoker    Packs/day: 0.25    Years: 6.00    Pack years: 1.50    Types: Cigarettes   Smokeless tobacco: Never Used  Substance Use Topics   Alcohol use: Yes    Comment: social    Drug use: Yes    Types: Marijuana    Comment: last used 04/17/2016     Allergies   Shellfish allergy   Review of Systems Review of Systems  Constitutional: Negative for chills and fever.  HENT: Negative for ear pain, sinus pressure and sore throat.   Eyes: Negative for pain and visual disturbance.  Respiratory: Negative for cough and shortness of breath.   Cardiovascular: Negative for chest pain and palpitations.    Gastrointestinal: Positive for abdominal pain, nausea and vomiting. Negative for diarrhea.  Genitourinary: Negative for dysuria and hematuria.  Musculoskeletal: Negative for arthralgias and back pain.  Skin: Negative for color change and rash.  Neurological: Negative for seizures and syncope.  All other systems reviewed and are negative.    Physical Exam Updated Vital Signs BP 126/66    Pulse 68    Temp 98.3 F (36.8 C) (Oral)    Resp 12    Ht 6' 1"  (1.854 m)    Wt 99.8 kg    SpO2 98%    BMI 29.03 kg/m   Physical Exam Vitals signs and nursing note reviewed.  Constitutional:      Appearance: Normal appearance.  He is ill-appearing.     Comments: Pacing room, groaning and moaning.   HENT:     Head: Normocephalic and atraumatic.     Mouth/Throat:     Mouth: Mucous membranes are dry.  Eyes:     Pupils: Pupils are equal, round, and reactive to light.  Neck:     Musculoskeletal: Normal range of motion and neck supple.  Cardiovascular:     Rate and Rhythm: Normal rate.  Pulmonary:     Effort: Pulmonary effort is normal.     Breath sounds: Normal breath sounds. No wheezing, rhonchi or rales.  Abdominal:     General: Abdomen is flat. Bowel sounds are decreased.     Palpations: Abdomen is soft.     Tenderness: There is generalized abdominal tenderness. There is no right CVA tenderness or left CVA tenderness.     Comments: Bowel sounds decrease throughout.   Skin:    General: Skin is dry.  Neurological:     Mental Status: He is alert and oriented to person, place, and time.      ED Treatments / Results  Labs (all labs ordered are listed, but only abnormal results are displayed) Labs Reviewed  COMPREHENSIVE METABOLIC PANEL - Abnormal; Notable for the following components:      Result Value   Glucose, Bld 335 (*)    Total Protein 8.6 (*)    All other components within normal limits  CBC - Abnormal; Notable for the following components:   WBC 10.7 (*)    RBC 5.89 (*)     All other components within normal limits  LIPASE, BLOOD  URINALYSIS, ROUTINE W REFLEX MICROSCOPIC    EKG EKG Interpretation  Date/Time:  Saturday June 25 2019 19:26:35 EDT Ventricular Rate:  69 PR Interval:    QRS Duration: 83 QT Interval:  410 QTC Calculation: 440 R Axis:   74 Text Interpretation:  Sinus rhythm Baseline wander in lead(s) III No significant change since last tracing Confirmed by Blanchie Dessert [phone]) on 06/25/2019 7:28:30 PM   Radiology No results found.  Procedures Procedures (including critical care time)  Medications Ordered in ED Medications  sodium chloride flush (NS) 0.9 % injection 3 mL (3 mLs Intravenous Given 06/25/19 1943)  ondansetron (ZOFRAN-ODT) disintegrating tablet 4 mg (4 mg Oral Given 06/25/19 1240)  sodium chloride 0.9 % bolus 1,000 mL (0 mLs Intravenous Stopped 06/25/19 2100)  pantoprazole (PROTONIX) injection 40 mg (40 mg Intravenous Given 06/25/19 1943)  haloperidol lactate (HALDOL) injection 5 mg (5 mg Intravenous Given 06/25/19 1943)  lactated ringers bolus 1,000 mL (0 mLs Intravenous Stopped 06/25/19 2335)     Initial Impression / Assessment and Plan / ED Course  I have reviewed the triage vital signs and the nursing notes.  Pertinent labs & imaging results that were available during my care of the patient were reviewed by me and considered in my medical decision making (see chart for details).  Clinical Course as of Jun 25 5  Sat Jun 25, 2019  2327 WBC(!): 10.7 [JS]  2327 Anion gap: 15 [JS]    Clinical Course User Index [JS] Janeece Fitting, PA-C      With a past medical history of diabetes, returns to the ED today for multiple episodes of vomiting.  Patient was seen in the ED on October 1, he had a reassuring work-up, had fluids along with some Haldol to help with his symptoms.  He reports not picking up any of his medication from the pharmacy.  Patient's labs were not remarkable for any DKA on his last hospital visit.  He  also reports last time he used any marijuana was 2 days ago.  During my primary evaluation patient appears very uncomfortable, he is pacing the room, requesting medication.  He also continues to vomit and spit on the floor despite multiple clean emesis bags surrounding his bed, will obtain EKG prior to administering Haldol to help with his symptoms.  CBC with a slight leukocytosis, suspect this is likely due to his vomiting.  CMP without any electrolyte derangement, glucose does appear elevated at 335, anion gap 15.  Although no DKA at this time, suspect that patient is likely trending to this.  We will continue to hydrate. LFTs are within normal limits along with kidney function.  UA is currently pending.  Patient has received a liter of fluids, continues to rest in room, has not provided any urine sample.  Vitals continue to be within normal limits without any hypoxia or tachycardia.  12:05 AM patient is tolerating p.o. without any vomiting, drink a whole glass of cranberry juice without any nausea.  Patient at this time is requesting discharge home as he reports he feels better.  He does a prescription for Zofran, however has not filled this, advised patient to pick up his prescription at home.  Patient understands and agrees with management, return precautions discussed at length.   Portions of this note were generated with Lobbyist. Dictation errors may occur despite best attempts at proofreading.  Final Clinical Impressions(s) / ED Diagnoses   Final diagnoses:  Diabetic gastroparesis [facility])    ED Discharge Orders    None       Janeece Fitting, PA-C 06/26/19 0006    Blanchie Dessert, MD 06/30/19 2239

## 2019-06-25 NOTE — ED Notes (Signed)
Pt appears to be much more comfortable however verbalizes that he is still in pain. When asked if he feels any better he states "no" however follows up talking about it hurting a bit less. Will continue to monitor.

## 2019-06-25 NOTE — ED Notes (Signed)
Given juice

## 2019-06-25 NOTE — ED Triage Notes (Signed)
Pt has history of Gastroparesis and is noncompliant in filling his Zofran prescription. He was seen 2 days ago for the same and given another prescription which has not been filled. Pt developed pain about an hour ago and is seeking attention in triage with moaning and groaning.

## 2019-06-26 ENCOUNTER — Emergency Department (HOSPITAL_COMMUNITY)
Admission: EM | Admit: 2019-06-26 | Discharge: 2019-06-26 | Disposition: A | Discharge status: Home / Self Care | Payer: Medicaid Other | Source: Home / Self Care | Attending: Emergency Medicine | Admitting: Emergency Medicine

## 2019-06-26 ENCOUNTER — Other Ambulatory Visit: Payer: Self-pay

## 2019-06-26 ENCOUNTER — Encounter (HOSPITAL_COMMUNITY): Payer: Self-pay | Admitting: Emergency Medicine

## 2019-06-26 DIAGNOSIS — R110 Nausea: Secondary | ICD-10-CM | POA: Insufficient documentation

## 2019-06-26 DIAGNOSIS — R1084 Generalized abdominal pain: Secondary | ICD-10-CM

## 2019-06-26 DIAGNOSIS — Z79899 Other long term (current) drug therapy: Secondary | ICD-10-CM | POA: Insufficient documentation

## 2019-06-26 DIAGNOSIS — E109 Type 1 diabetes mellitus without complications: Secondary | ICD-10-CM | POA: Insufficient documentation

## 2019-06-26 DIAGNOSIS — Z794 Long term (current) use of insulin: Secondary | ICD-10-CM | POA: Insufficient documentation

## 2019-06-26 DIAGNOSIS — F17210 Nicotine dependence, cigarettes, uncomplicated: Secondary | ICD-10-CM | POA: Insufficient documentation

## 2019-06-26 LAB — COMPREHENSIVE METABOLIC PANEL
ALT: 16 U/L (ref 0–44)
AST: 14 U/L — ABNORMAL LOW (ref 15–41)
Albumin: 4 g/dL (ref 3.5–5.0)
Alkaline Phosphatase: 105 U/L (ref 38–126)
Anion gap: 14 (ref 5–15)
BUN: 8 mg/dL (ref 6–20)
CO2: 22 mmol/L (ref 22–32)
Calcium: 9.2 mg/dL (ref 8.9–10.3)
Chloride: 99 mmol/L (ref 98–111)
Creatinine, Ser: 1.06 mg/dL (ref 0.61–1.24)
GFR calc Af Amer: >60 mL/min (ref >60)
GFR calc non Af Amer: >60 mL/min (ref >60)
Glucose, Bld: 351 mg/dL — ABNORMAL HIGH (ref 70–99)
Potassium: 3.5 mmol/L (ref 3.5–5.1)
Sodium: 135 mmol/L (ref 135–145)
Total Bilirubin: 1 mg/dL (ref 0.3–1.2)
Total Protein: 7.1 g/dL (ref 6.5–8.1)

## 2019-06-26 LAB — URINALYSIS, ROUTINE W REFLEX MICROSCOPIC
Bilirubin Urine: NEGATIVE
Glucose, UA: >=500 mg/dL — AB
Hgb urine dipstick: NEGATIVE
Ketones, ur: 80 mg/dL — AB
Leukocytes,Ua: NEGATIVE
Nitrite: NEGATIVE
Protein, ur: NEGATIVE mg/dL
Specific Gravity, Urine: 1.044 — ABNORMAL HIGH (ref 1.005–1.030)
pH: 6 (ref 5.0–8.0)

## 2019-06-26 LAB — CBC
HCT: 42.5 % (ref 39.0–52.0)
Hemoglobin: 14.5 g/dL (ref 13.0–17.0)
MCH: 27.1 pg (ref 26.0–34.0)
MCHC: 34.1 g/dL (ref 30.0–36.0)
MCV: 79.3 fL — ABNORMAL LOW (ref 80.0–100.0)
Platelets: 310 10*3/uL (ref 150–400)
RBC: 5.36 MIL/uL (ref 4.22–5.81)
RDW: 12.8 % (ref 11.5–15.5)
WBC: 10.3 10*3/uL (ref 4.0–10.5)
nRBC: 0 % (ref 0.0–0.2)

## 2019-06-26 LAB — LIPASE, BLOOD: Lipase: 20 U/L (ref 11–51)

## 2019-06-26 MED ORDER — LORAZEPAM 2 MG/ML IJ SOLN
1.0000 mg | Freq: Once | INTRAMUSCULAR | Status: AC
  Administered 2019-06-26: 1 mg via INTRAVENOUS
  Filled 2019-06-26: qty 1

## 2019-06-26 MED ORDER — SODIUM CHLORIDE 0.9 % IV BOLUS
1000.0000 mL | Freq: Once | INTRAVENOUS | Status: AC
  Administered 2019-06-26: 1000 mL via INTRAVENOUS

## 2019-06-26 MED ORDER — ONDANSETRON 4 MG PO TBDP
4.0000 mg | ORAL_TABLET | Freq: Once | ORAL | Status: AC | PRN
  Administered 2019-06-26: 4 mg via ORAL
  Filled 2019-06-26: qty 1

## 2019-06-26 MED ORDER — SODIUM CHLORIDE 0.9% FLUSH: 3.0000 mL | Freq: Once | INTRAVENOUS | Status: DC

## 2019-06-26 NOTE — ED Triage Notes (Signed)
Pt here for reevaluation of emesis and abdominal pain. Seen at [facility] yesterday for same. Did not fill zofran script.

## 2019-06-26 NOTE — Discharge Instructions (Addendum)
Your laboratory results were within normal limits.  You will need to pick up your medication from the pharmacy to help with your nausea.  Please follow up with your primary care physician.

## 2019-06-26 NOTE — ED Provider Notes (Signed)
[place] [facility] EMERGENCY DEPARTMENT Provider Note   CSN: [id number] Arrival date & time: 06/26/19  1423     History   Chief Complaint Chief Complaint  Patient presents with  . Abdominal Pain    HPI Jonathan Porter is a 28 y.o. male.     28 y.o male with a PMH of DM presents to the ED with a chief complaint abdominal pain.  Patient was seen at the [facility] long emergency department last night by me, reports his symptoms have improved but she is not had any more vomiting episodes however he still reports a generalized abdominal pain.  He states he mostly feels along the epigastric region, states this is consistent with his prior history of gastroparesis.  He has not been taking his insulin as scheduled.  Patient does have a prescription for Zofran that was written 3 days ago as this is his third visit in the past 4 days, reports he has not been able to fill this prescription as his pharmacy is closed over the weekend.  Patient did not voice pharmacy being closed on the weekends to me yesterday, reports "I forgot to do so ".  Patient reports has not had any episodes of vomiting today, does feel nauseated and has a generalized epigastric pain.  No fevers, diarrhea, pain or shortness of breath.  The history is provided by the patient and medical records.  Abdominal Pain Pain location:  Epigastric Associated symptoms: nausea   Associated symptoms: no chest pain, no chills, no cough, no dysuria, no fever, no hematuria, no shortness of breath, no sore throat and no vomiting     Past Medical History:  Diagnosis Date  . Diabetes mellitus without complication [facility])     Patient Active Problem List   Diagnosis Date Noted  . Influenza vaccine refused 06/03/2019  . Erectile dysfunction 06/03/2019  . Obesity (BMI 30-39.9) 06/03/2019  . Type 1 diabetes mellitus with diabetic polyneuropathy ([place]) 01/21/2019  . Type 1 diabetes mellitus with hyperglycemia ([place]) 01/21/2019  .  Paronychia of finger of left hand 01/20/2017  . 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 Diabetes type 1, uncontrolled ([place]) 08/23/2015  . Onychomycosis of toenail 08/23/2015  . Phimosis 08/07/2014    Past Surgical History:  Procedure Laterality Date  . NO PAST SURGERIES          Home Medications    Prior to Admission medications   Medication Sig Start Date End Date Taking? Authorizing Provider  Accu-Chek FastClix Lancets MISC Use to check blood sugar up to 3 times daily. 06/03/19   Ladell Pier, MD  Blood Glucose Monitoring Suppl (ACCU-CHEK GUIDE ME) w/Device KIT 1 kit by Does not apply route 3 (three) times daily. Use to check blood sugar up to 3 times daily. 06/03/19   Ladell Pier, MD  dicyclomine (BENTYL) 20 MG tablet Take 1 tablet (20 mg total) by mouth 2 (two) times daily. 04/11/19   Blanchie Dessert, MD  gabapentin (NEURONTIN) 100 MG capsule Take 1 capsule (100 mg total) by mouth 3 (three) times daily. Patient not taking: Reported on 09/28/2018 08/25/18   Gildardo Pounds, NP  glucose blood (ACCU-CHEK GUIDE) test strip 4x a day 06/03/19   Ladell Pier, MD  glucose blood (TRUE METRIX BLOOD GLUCOSE TEST) test strip 1 each by Other route 3 (three) times daily. Patient not taking: Reported on 03/03/2019 09/28/18   Ladell Pier, MD  imiquimod Leroy Sea) 5 % cream Apply  a thin layer 3 times per week (on alternate days) prior to bedtime; leave on skin for 6 to 10 hours, then remove with mild soap and water. Continue until there is total clearance of the genital/perianal warts or for a maximum duration of therapy of 16 weeks Patient not taking: Reported on 06/23/2019 07/02/18   Ladell Pier, MD  insulin aspart (NOVOLOG) 100 UNIT/ML FlexPen Inject 15 Units into the skin 3 (three) times daily with meals. 09/28/18   Ladell Pier, MD  Insulin Glargine (LANTUS SOLOSTAR) 100 UNIT/ML Solostar Pen Inject 15 Units into the skin daily at 10 pm. 09/28/18    Ladell Pier, MD  Insulin Pen Needle (B-D ULTRAFINE III SHORT PEN) 31G X 8 MM MISC 1 application by Does not apply route 4 (four) times daily. 05/21/18   Ladell Pier, MD  metoCLOPramide (REGLAN) 10 MG tablet Take 1 tablet (10 mg total) by mouth every 6 (six) hours. Patient not taking: Reported on 06/23/2019 04/11/19   Blanchie Dessert, MD  nicotine (NICODERM CQ - DOSED IN MG/24 HOURS) 14 mg/24hr patch Place 1 patch (14 mg total) onto the skin daily. Patient not taking: Reported on 01/21/2019 09/28/18   Ladell Pier, MD  nicotine (NICODERM CQ - DOSED IN MG/24 HR) 7 mg/24hr patch Place 1 patch (7 mg total) onto the skin daily. Patient not taking: Reported on 06/23/2019 06/03/19   Ladell Pier, MD  omeprazole (PRILOSEC) 20 MG capsule Take 1 capsule (20 mg total) by mouth daily. Patient not taking: Reported on 06/23/2019 04/11/19   Blanchie Dessert, MD  ondansetron (ZOFRAN ODT) 4 MG disintegrating tablet Take 1 tablet (4 mg total) by mouth every 8 (eight) hours as needed for nausea or vomiting. 06/24/19   Montine Circle, PA-C  sildenafil (VIAGRA) 50 MG tablet TAKE 1 TABLET BY MOUTH A HALF HOUR PRIOR TO SEXUAL INTERCOURSE AS NEEDED. *LIMIT TO 1 TABLET PER 24 HOURS* 06/03/19   Ladell Pier, MD  triamcinolone cream (KENALOG) 0.1 % Apply 1 application topically 2 (two) times daily. Patient not taking: Reported on 06/23/2019 03/03/19   Argentina Donovan, PA-C    Family History Family History  Problem Relation Age of Onset  . Thyroid disease Mother   . Diabetes Maternal Aunt   . Diabetes Maternal Grandmother   . Diabetes Maternal Grandfather   . Thyroid disease Other     Social History Social History   Tobacco Use  . Smoking status: Current Every Day Smoker    Packs/day: 0.25    Years: 6.00    Pack years: 1.50    Types: Cigarettes  . Smokeless tobacco: Never Used  Substance Use Topics  . Alcohol use: Yes    Comment: social   . Drug use: Yes    Types: Marijuana     Comment: last used 04/17/2016     Allergies   Shellfish allergy   Review of Systems Review of Systems  Constitutional: Negative for chills and fever.  HENT: Negative for ear pain and sore throat.   Eyes: Negative for pain and visual disturbance.  Respiratory: Negative for cough and shortness of breath.   Cardiovascular: Negative for chest pain and palpitations.  Gastrointestinal: Positive for abdominal pain and nausea. Negative for vomiting.  Genitourinary: Negative for dysuria and hematuria.  Musculoskeletal: Negative for arthralgias and back pain.  Skin: Negative for color change and rash.  Neurological: Negative for seizures and syncope.  All other systems reviewed and are negative.  Physical Exam Updated Vital Signs BP 129/75   Pulse 63   Temp 98.9 F (37.2 C) (Oral)   Resp 17   SpO2 99%   Physical Exam Vitals signs and nursing note reviewed.  Constitutional:      Appearance: He is well-developed. He is not ill-appearing.     Comments: Pacing the room.  No active vomiting.  HENT:     Head: Normocephalic and atraumatic.  Eyes:     General: No scleral icterus.    Pupils: Pupils are equal, round, and reactive to light.  Neck:     Musculoskeletal: Normal range of motion.  Cardiovascular:     Heart sounds: Normal heart sounds.  Pulmonary:     Effort: Pulmonary effort is normal.     Breath sounds: Normal breath sounds. No wheezing.     Comments: Lungs are diminished to auscultation, no wheezing or rhonchi to my exam. Chest:     Chest wall: No tenderness.  Abdominal:     General: Bowel sounds are normal. There is no distension.     Palpations: Abdomen is soft.     Tenderness: There is no abdominal tenderness.     Comments: Soft, not tender to palpation on my exam.  Bowel sounds are diminished.  Musculoskeletal:        General: No tenderness or deformity.  Skin:    General: Skin is warm and dry.  Neurological:     Mental Status: He is alert and oriented to  person, place, and time.      ED Treatments / Results  Labs (all labs ordered are listed, but only abnormal results are displayed) Labs Reviewed  COMPREHENSIVE METABOLIC PANEL - Abnormal; Notable for the following components:      Result Value   Glucose, Bld 351 (*)    AST 14 (*)    All other components within normal limits  CBC - Abnormal; Notable for the following components:   MCV 79.3 (*)    All other components within normal limits  URINALYSIS, ROUTINE W REFLEX MICROSCOPIC - Abnormal; Notable for the following components:   Specific Gravity, Urine 1.044 (*)    Glucose, UA >=500 (*)    Ketones, ur 80 (*)    Bacteria, UA RARE (*)    All other components within normal limits  LIPASE, BLOOD    EKG EKG Interpretation  Date/Time:  Sunday June 26 2019 16:25:34 EDT Ventricular Rate:  57 PR Interval:    QRS Duration: 98 QT Interval:  431 QTC Calculation: 420 R Axis:   76 Text Interpretation:  Age not entered, assumed to be  28 years old for purpose of ECG interpretation Sinus rhythm Short PR interval Low voltage, precordial leads Baseline wander in lead(s) V2 Confirmed by Dene Gentry [phone]) on 06/26/2019 6:44:29 PM   Radiology No results found.  Procedures Procedures (including critical care time)  Medications Ordered in ED Medications  sodium chloride flush (NS) 0.9 % injection 3 mL (3 mLs Intravenous Not Given 06/26/19 1647)  ondansetron (ZOFRAN-ODT) disintegrating tablet 4 mg (4 mg Oral Given 06/26/19 1508)  sodium chloride 0.9 % bolus 1,000 mL (0 mLs Intravenous Stopped 06/26/19 1906)  LORazepam (ATIVAN) injection 1 mg (1 mg Intravenous Given 06/26/19 1653)     Initial Impression / Assessment and Plan / ED Course  I have reviewed the triage vital signs and the nursing notes.  Pertinent labs & imaging results that were available during my care of the patient were reviewed by me and  considered in my medical decision making (see chart for details).        Patient with a PMH of diabetic gastroparesis, mariajuana abuse presents to the ED with a chief complaint of abdominal pain. Patient was seen at [facility] yesterday, he had an unremarkarble work-up, found to not be in DKA.  Was tolerating p.o. upon discharge.  Today patient returns as he reports abdominal pain has returned, he has had no episodes of vomiting since yesterday, states his stomach hurts all throughout, last used marijuana 2 days ago.  He is currently measuring his sugar although reports he has not been eating much.  Lab work was obtained today which showed no electrolyte derangement, glucose is elevated on today's visit by creatinine level remained stable.  LFTs are within normal limits.  No anion gap, doubt DKA.  UA did have some ketones but rare bacteria.  BC without any leukocytosis.  Lipase level is within normal limits.  Patient received fluids, Ativan.  EKG showed borderline prolongation in QT as he received Tylenol for the past 2 days.  Will attempt Ativan to help with his symptoms.  Patient states he has not picked up his medication from the pharmacy his pharmacy was closed over the weekend, when asked about having the prescription printed, he reports he only gets his medication from that particular pharmacy.  His vitals are within normal limits, there is no hypertension, afebrile, no tachycardia.   7:16 PM patient reports improvement in symptoms after Ativan, he is tolerating p.o. with ginger ale and crackers without any vomiting episodes while in the ED.  Patient did have a CT 3 months ago, this did not show any acute process back pain.  Reports that pain is similar to his previous gastroparesis symptoms.  According to his chart which I have extensively reviewed, patient was attempted to be scheduled with [facility], unfortunately due to him not paying a bill several years ago they were unable to schedule him.  Patient does have good follow-up with a [facility].  He was  advised to follow-up with them.  Patient with otherwise stable vital signs, asymptomatic stable for discharge.   Portions of this note were generated with [employer]. Dictation errors may occur despite best attempts at proofreading.  Final Clinical Impressions(s) / ED Diagnoses   Final diagnoses:  Generalized abdominal pain    ED Discharge Orders    None       Janeece Fitting, Hershal Coria 06/26/19 1920    Valarie Merino, MD 06/27/19 1943

## 2019-06-26 NOTE — ED Notes (Signed)
Gave pt Ginger Ale for Fluid/PO Challenge. Pt able to take a sip without any issues.

## 2019-06-27 MED FILL — ACCU-CHEK FASTCLIX LANCETS: 34 days supply | Qty: 102 | Fill #0

## 2019-06-27 MED FILL — NOVOLOG FLEXPEN SYRINGE: 100 | 33 days supply | Qty: 15 | Fill #2

## 2019-06-27 MED FILL — ONDANSETRON ODT 4 MG TABLET: 4 | 3 days supply | Qty: 10 | Fill #0

## 2019-06-27 MED FILL — ACCU-CHEK GUIDE TEST STRIP: 25 days supply | Qty: 100 | Fill #0

## 2019-06-27 MED FILL — LANTUS SOLOSTAR 100 UNITS/M: 100 | 90 days supply | Qty: 15 | Fill #1

## 2019-06-28 ENCOUNTER — Encounter (HOSPITAL_COMMUNITY): Payer: Self-pay | Admitting: Emergency Medicine

## 2019-06-28 ENCOUNTER — Emergency Department (HOSPITAL_COMMUNITY)
Admission: EM | Admit: 2019-06-28 | Discharge: 2019-06-28 | Disposition: A | Discharge status: Home / Self Care | Payer: Medicaid Other | Attending: Emergency Medicine | Admitting: Emergency Medicine

## 2019-06-28 ENCOUNTER — Other Ambulatory Visit: Payer: Self-pay

## 2019-06-28 DIAGNOSIS — Z79899 Other long term (current) drug therapy: Secondary | ICD-10-CM | POA: Diagnosis not present

## 2019-06-28 DIAGNOSIS — E109 Type 1 diabetes mellitus without complications: Secondary | ICD-10-CM | POA: Insufficient documentation

## 2019-06-28 DIAGNOSIS — F1290 Cannabis use, unspecified, uncomplicated: Secondary | ICD-10-CM | POA: Diagnosis not present

## 2019-06-28 DIAGNOSIS — R112 Nausea with vomiting, unspecified: Secondary | ICD-10-CM | POA: Insufficient documentation

## 2019-06-28 DIAGNOSIS — F17210 Nicotine dependence, cigarettes, uncomplicated: Secondary | ICD-10-CM | POA: Insufficient documentation

## 2019-06-28 DIAGNOSIS — R1010 Upper abdominal pain, unspecified: Secondary | ICD-10-CM | POA: Diagnosis present

## 2019-06-28 LAB — URINALYSIS, ROUTINE W REFLEX MICROSCOPIC
Bilirubin Urine: NEGATIVE
Glucose, UA: >=500 mg/dL — AB
Hgb urine dipstick: NEGATIVE
Ketones, ur: 80 mg/dL — AB
Leukocytes,Ua: NEGATIVE
Nitrite: NEGATIVE
Protein, ur: 30 mg/dL — AB
Specific Gravity, Urine: 1.029 (ref 1.005–1.030)
pH: 5 (ref 5.0–8.0)

## 2019-06-28 LAB — LIPASE, BLOOD: Lipase: 18 U/L (ref 11–51)

## 2019-06-28 LAB — COMPREHENSIVE METABOLIC PANEL
ALT: 15 U/L (ref 0–44)
AST: 15 U/L (ref 15–41)
Albumin: 4 g/dL (ref 3.5–5.0)
Alkaline Phosphatase: 103 U/L (ref 38–126)
Anion gap: 16 — ABNORMAL HIGH (ref 5–15)
BUN: 10 mg/dL (ref 6–20)
CO2: 24 mmol/L (ref 22–32)
Calcium: 9.1 mg/dL (ref 8.9–10.3)
Chloride: 99 mmol/L (ref 98–111)
Creatinine, Ser: 1.44 mg/dL — ABNORMAL HIGH (ref 0.61–1.24)
GFR calc Af Amer: >60 mL/min (ref >60)
GFR calc non Af Amer: >60 mL/min (ref >60)
Glucose, Bld: 265 mg/dL — ABNORMAL HIGH (ref 70–99)
Potassium: 3.4 mmol/L — ABNORMAL LOW (ref 3.5–5.1)
Sodium: 139 mmol/L (ref 135–145)
Total Bilirubin: 0.8 mg/dL (ref 0.3–1.2)
Total Protein: 7.2 g/dL (ref 6.5–8.1)

## 2019-06-28 LAB — CBC
HCT: 44.9 % (ref 39.0–52.0)
Hemoglobin: 14.7 g/dL (ref 13.0–17.0)
MCH: 26.3 pg (ref 26.0–34.0)
MCHC: 32.7 g/dL (ref 30.0–36.0)
MCV: 80.5 fL (ref 80.0–100.0)
Platelets: 351 10*3/uL (ref 150–400)
RBC: 5.58 MIL/uL (ref 4.22–5.81)
RDW: 13 % (ref 11.5–15.5)
WBC: 8.9 10*3/uL (ref 4.0–10.5)
nRBC: 0 % (ref 0.0–0.2)

## 2019-06-28 MED ORDER — CAPSAICIN 0.025 % EX CREA
TOPICAL_CREAM | Freq: Once | CUTANEOUS | Status: AC
  Administered 2019-06-28: 12:00:00 via TOPICAL
  Filled 2019-06-28: qty 60

## 2019-06-28 MED ORDER — HALOPERIDOL LACTATE 5 MG/ML IJ SOLN
5.0000 mg | Freq: Once | INTRAMUSCULAR | Status: AC
  Administered 2019-06-28: 09:00:00 5 mg via INTRAVENOUS
  Filled 2019-06-28: qty 1

## 2019-06-28 MED ORDER — ONDANSETRON HCL 4 MG/2ML IJ SOLN
4.0000 mg | Freq: Once | INTRAMUSCULAR | Status: AC
  Administered 2019-06-28: 09:00:00 4 mg via INTRAVENOUS
  Filled 2019-06-28: qty 2

## 2019-06-28 MED ORDER — SODIUM CHLORIDE 0.9 % IV BOLUS: 1000.0000 mL | Freq: Once | INTRAVENOUS | Status: DC

## 2019-06-28 MED ORDER — SODIUM CHLORIDE 0.9% FLUSH
3.0000 mL | Freq: Once | INTRAVENOUS | Status: AC
  Administered 2019-06-28: 3 mL via INTRAVENOUS

## 2019-06-28 MED ORDER — SODIUM CHLORIDE 0.9 % IV BOLUS
1000.0000 mL | Freq: Once | INTRAVENOUS | Status: AC
  Administered 2019-06-28: 1000 mL via INTRAVENOUS

## 2019-06-28 MED FILL — ACCU-CHEK GUIDE W/DEVICE KI: W/DEVICE | 30 days supply | Qty: 1 | Fill #0

## 2019-06-28 NOTE — ED Triage Notes (Signed)
Patient here for abdominal pain and emesis.  Patient seen in hospital 4 times in last week for same.  Patient given meds, he states they are not working.

## 2019-06-28 NOTE — Discharge Instructions (Signed)
Avoid marijuana use as it may contribute to your symptoms.  Monitor your blood sugar closely.  Stay hydrated.  Return if your condition worsen as it may be due to diabetic ketoacidosis.

## 2019-06-28 NOTE — ED Provider Notes (Signed)
[facility] EMERGENCY DEPARTMENT Provider Note   CSN: [id number] Arrival date & time: 06/28/19  2202     History   Chief Complaint Chief Complaint  Patient presents with  . Abdominal Pain    HPI Jonathan Porter is a 28 y.o. male.     The history is provided by the patient and medical records. No language interpreter was used.  Abdominal Pain    28 year old male with history of diabetes, diabetic gastroparesis, marijuana abuse, presenting for evaluation of abdominal pain.  Please note this is patient's fourth visit within the past 2 weeks for similar symptoms.  Patient report despite receiving treatment in the ED for his abdominal discomfort for the past week, his symptoms do persist.  He described sharp crampy upper abdominal pain, moderate to severe, persistent, nothing seems to make it better or worse.  He endorsed dry heaving with minimal vomiting.  Normal bowel movement.  No associated fever chills no chest pain shortness of breath or productive cough no dysuria.  He admits to use marijuana but states that he quit using it for the past for 5 days but symptoms still persist.  Patient mention he tries to be compliant with his diabetic medication.  He has not had a chance to follow-up with his PCP yet.  Report feeling thirsty.  No recent sick contact.  Past Medical History:  Diagnosis Date  . Diabetes mellitus without complication [facility])     Patient Active Problem List   Diagnosis Date Noted  . Influenza vaccine refused 06/03/2019  . Erectile dysfunction 06/03/2019  . Obesity (BMI 30-39.9) 06/03/2019  . Type 1 diabetes mellitus with diabetic polyneuropathy ([place]) 01/21/2019  . Type 1 diabetes mellitus with hyperglycemia ([place]) 01/21/2019  . Paronychia of finger of left hand 01/20/2017  . 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 Diabetes type 1, uncontrolled ([place]) 08/23/2015  . Onychomycosis of toenail 08/23/2015  . Phimosis 08/07/2014     Past Surgical History:  Procedure Laterality Date  . NO PAST SURGERIES          Home Medications    Prior to Admission medications   Medication Sig Start Date End Date Taking? Authorizing Provider  Accu-Chek FastClix Lancets MISC Use to check blood sugar up to 3 times daily. 06/03/19   Ladell Pier, MD  Blood Glucose Monitoring Suppl (ACCU-CHEK GUIDE ME) w/Device KIT 1 kit by Does not apply route 3 (three) times daily. Use to check blood sugar up to 3 times daily. 06/03/19   Ladell Pier, MD  dicyclomine (BENTYL) 20 MG tablet Take 1 tablet (20 mg total) by mouth 2 (two) times daily. 04/11/19   Blanchie Dessert, MD  gabapentin (NEURONTIN) 100 MG capsule Take 1 capsule (100 mg total) by mouth 3 (three) times daily. Patient not taking: Reported on 09/28/2018 08/25/18   Gildardo Pounds, NP  glucose blood (ACCU-CHEK GUIDE) test strip 4x a day 06/03/19   Ladell Pier, MD  glucose blood (TRUE METRIX BLOOD GLUCOSE TEST) test strip 1 each by Other route 3 (three) times daily. Patient not taking: Reported on 03/03/2019 09/28/18   Ladell Pier, MD  imiquimod Leroy Sea) 5 % cream Apply a thin layer 3 times per week (on alternate days) prior to bedtime; leave on skin for 6 to 10 hours, then remove with mild soap and water. Continue until there is total clearance of the genital/perianal warts or for a maximum duration of therapy of 16 weeks Patient  not taking: Reported on 06/23/2019 07/02/18   Ladell Pier, MD  insulin aspart (NOVOLOG) 100 UNIT/ML FlexPen Inject 15 Units into the skin 3 (three) times daily with meals. 09/28/18   Ladell Pier, MD  Insulin Glargine (LANTUS SOLOSTAR) 100 UNIT/ML Solostar Pen Inject 15 Units into the skin daily at 10 pm. 09/28/18   Ladell Pier, MD  Insulin Pen Needle (B-D ULTRAFINE III SHORT PEN) 31G X 8 MM MISC 1 application by Does not apply route 4 (four) times daily. 05/21/18   Ladell Pier, MD  metoCLOPramide (REGLAN) 10 MG tablet Take  1 tablet (10 mg total) by mouth every 6 (six) hours. Patient not taking: Reported on 06/23/2019 04/11/19   Blanchie Dessert, MD  nicotine (NICODERM CQ - DOSED IN MG/24 HOURS) 14 mg/24hr patch Place 1 patch (14 mg total) onto the skin daily. Patient not taking: Reported on 01/21/2019 09/28/18   Ladell Pier, MD  nicotine (NICODERM CQ - DOSED IN MG/24 HR) 7 mg/24hr patch Place 1 patch (7 mg total) onto the skin daily. Patient not taking: Reported on 06/23/2019 06/03/19   Ladell Pier, MD  omeprazole (PRILOSEC) 20 MG capsule Take 1 capsule (20 mg total) by mouth daily. Patient not taking: Reported on 06/23/2019 04/11/19   Blanchie Dessert, MD  ondansetron (ZOFRAN ODT) 4 MG disintegrating tablet Take 1 tablet (4 mg total) by mouth every 8 (eight) hours as needed for nausea or vomiting. 06/24/19   Montine Circle, PA-C  sildenafil (VIAGRA) 50 MG tablet TAKE 1 TABLET BY MOUTH A HALF HOUR PRIOR TO SEXUAL INTERCOURSE AS NEEDED. *LIMIT TO 1 TABLET PER 24 HOURS* 06/03/19   Ladell Pier, MD  triamcinolone cream (KENALOG) 0.1 % Apply 1 application topically 2 (two) times daily. Patient not taking: Reported on 06/23/2019 03/03/19   Argentina Donovan, PA-C    Family History Family History  Problem Relation Age of Onset  . Thyroid disease Mother   . Diabetes Maternal Aunt   . Diabetes Maternal Grandmother   . Diabetes Maternal Grandfather   . Thyroid disease Other     Social History Social History   Tobacco Use  . Smoking status: Current Every Day Smoker    Packs/day: 0.25    Years: 6.00    Pack years: 1.50    Types: Cigarettes  . Smokeless tobacco: Never Used  Substance Use Topics  . Alcohol use: Yes    Comment: social   . Drug use: Yes    Types: Marijuana    Comment: last used 04/17/2016     Allergies   Shellfish allergy   Review of Systems Review of Systems  Gastrointestinal: Positive for abdominal pain.  All other systems reviewed and are negative.    Physical Exam  Updated Vital Signs BP (!) 153/101 (BP Location: Right Arm)   Pulse 63   Temp 97.7 F (36.5 C) (Oral)   Resp 20   SpO2 100%   Physical Exam Vitals signs and nursing note reviewed.  Constitutional:      General: He is not in acute distress.    Appearance: He is well-developed.     Comments: Patient appears uncomfortable, pacing around the room.  HENT:     Head: Atraumatic.  Eyes:     Conjunctiva/sclera: Conjunctivae normal.  Neck:     Musculoskeletal: Neck supple.  Cardiovascular:     Rate and Rhythm: Normal rate and regular rhythm.  Pulmonary:     Effort: Pulmonary effort is normal.  Breath sounds: Normal breath sounds.  Abdominal:     General: Abdomen is flat.     Palpations: Abdomen is soft.     Tenderness: There is abdominal tenderness (Minimal epigastric tenderness without guarding or rebound tenderness.  Negative Murphy sign, no pain at McBurney's point.).  Skin:    Findings: No rash.  Neurological:     Mental Status: He is alert and oriented to person, place, and time.  Psychiatric:        Mood and Affect: Mood normal.      ED Treatments / Results  Labs (all labs ordered are listed, but only abnormal results are displayed) Labs Reviewed  COMPREHENSIVE METABOLIC PANEL - Abnormal; Notable for the following components:      Result Value   Potassium 3.4 (*)    Glucose, Bld 265 (*)    Creatinine, Ser 1.44 (*)    Anion gap 16 (*)    All other components within normal limits  URINALYSIS, ROUTINE W REFLEX MICROSCOPIC - Abnormal; Notable for the following components:   Glucose, UA >=500 (*)    Ketones, ur 80 (*)    Protein, ur 30 (*)    Bacteria, UA RARE (*)    All other components within normal limits  LIPASE, BLOOD  CBC    EKG None  Radiology No results found.  Procedures Procedures (including critical care time)  Medications Ordered in ED Medications  capsaicin (ZOSTRIX) 0.025 % cream (has no administration in time range)  sodium chloride  flush (NS) 0.9 % injection 3 mL (3 mLs Intravenous Given 06/28/19 0931)  haloperidol lactate (HALDOL) injection 5 mg (5 mg Intravenous Given 06/28/19 0928)  sodium chloride 0.9 % bolus 1,000 mL (0 mLs Intravenous Stopped 06/28/19 1118)  ondansetron (ZOFRAN) injection 4 mg (4 mg Intravenous Given 06/28/19 0928)     Initial Impression / Assessment and Plan / ED Course  I have reviewed the triage vital signs and the nursing notes.  Pertinent labs & imaging results that were available during my care of the patient were reviewed by me and considered in my medical decision making (see chart for details).        BP (!) 154/106 (BP Location: Right Arm)   Pulse 71   Temp 98.2 F (36.8 C) (Oral)   Resp (!) 22   SpO2 100%    Final Clinical Impressions(s) / ED Diagnoses   Final diagnoses:  Intractable vomiting with nausea, unspecified vomiting type    ED Discharge Orders    None     8:50 AM Patient with history of type 1 diabetes and possibly diabetic gastroparesis as well as history of marijuana use here with recurrent upper abdominal pain and associate nausea and vomiting.  This is his fourth ER visit within the past 1 to 2 weeks for similar.  He has had previous CT scan that was unremarkable.  He has had prior EKG without prolonged QT.  He has a fairly benign abdominal exam.  Vital signs stable.  Will provide symptomatic treatment.  11:34 AM Patient reported much improvement after receiving Haldol, capsaicin cream, and IV fluids with antinausea medication.  He is able to tolerate p.o.  Labs remarkable for creatinine of 1.44, and 80 ketones in urine consistent with dehydration.  Elevated CBG of 265 with a mildly elevated anion gap of 16.  By lab values 1 could argue that patient is currently in DKA.  I felt this is more likely secondary to his dehydration.  He is clinically  better.  We will continue to monitor, and will recheck his CMP.  If anion gap improves, patient can be discharged.  I  discussed plan of care with patient however he states that he feels much better and he does not want to stay.  Patient understand that he can return promptly if his condition worsen.  Start encourage patient to avoid marijuana use and to also follow-up with his doctor closely.  Care discussed with Dr. Eulis Foster.    Domenic Moras, PA-C 06/28/19 1142    Daleen Bo, MD 06/28/19 2014

## 2019-06-28 NOTE — ED Notes (Signed)
PO challenge complete. Patient tolerated well. Able to consume full cup of water. Denies nausea.

## 2019-06-28 NOTE — Discharge Planning (Signed)
[facility] notes multiple admissions.  Scheduled follow-up appointment with PCP 10/26 @ 11:00

## 2019-06-29 ENCOUNTER — Encounter (HOSPITAL_COMMUNITY): Payer: Self-pay | Admitting: Emergency Medicine

## 2019-06-29 ENCOUNTER — Emergency Department (HOSPITAL_COMMUNITY)
Admission: EM | Admit: 2019-06-29 | Discharge: 2019-06-29 | Disposition: A | Discharge status: Home / Self Care | Payer: Medicaid Other | Attending: Emergency Medicine | Admitting: Emergency Medicine

## 2019-06-29 DIAGNOSIS — Z20828 Contact with and (suspected) exposure to other viral communicable diseases: Secondary | ICD-10-CM | POA: Diagnosis not present

## 2019-06-29 DIAGNOSIS — R1084 Generalized abdominal pain: Secondary | ICD-10-CM | POA: Diagnosis not present

## 2019-06-29 DIAGNOSIS — Z79899 Other long term (current) drug therapy: Secondary | ICD-10-CM | POA: Insufficient documentation

## 2019-06-29 DIAGNOSIS — E109 Type 1 diabetes mellitus without complications: Secondary | ICD-10-CM | POA: Diagnosis not present

## 2019-06-29 DIAGNOSIS — F17210 Nicotine dependence, cigarettes, uncomplicated: Secondary | ICD-10-CM | POA: Diagnosis not present

## 2019-06-29 LAB — COMPREHENSIVE METABOLIC PANEL WITH GFR
ALT: 15 U/L (ref 0–44)
AST: 14 U/L — ABNORMAL LOW (ref 15–41)
Albumin: 4.8 g/dL (ref 3.5–5.0)
Alkaline Phosphatase: 98 U/L (ref 38–126)
Anion gap: 14 (ref 5–15)
BUN: 10 mg/dL (ref 6–20)
CO2: 23 mmol/L (ref 22–32)
Calcium: 9.5 mg/dL (ref 8.9–10.3)
Chloride: 102 mmol/L (ref 98–111)
Creatinine, Ser: 1.04 mg/dL (ref 0.61–1.24)
GFR calc Af Amer: >60 mL/min
GFR calc non Af Amer: >60 mL/min
Glucose, Bld: 143 mg/dL — ABNORMAL HIGH (ref 70–99)
Potassium: 3.2 mmol/L — ABNORMAL LOW (ref 3.5–5.1)
Sodium: 139 mmol/L (ref 135–145)
Total Bilirubin: 0.8 mg/dL (ref 0.3–1.2)
Total Protein: 8.3 g/dL — ABNORMAL HIGH (ref 6.5–8.1)

## 2019-06-29 LAB — CBC WITH DIFFERENTIAL/PLATELET
Abs Immature Granulocytes: 0.04 K/uL (ref 0.00–0.07)
Basophils Absolute: 0.1 K/uL (ref 0.0–0.1)
Basophils Relative: 1 %
Eosinophils Absolute: 0 K/uL (ref 0.0–0.5)
Eosinophils Relative: 0 %
HCT: 45.8 % (ref 39.0–52.0)
Hemoglobin: 15.1 g/dL (ref 13.0–17.0)
Immature Granulocytes: 0 %
Lymphocytes Relative: 19 %
Lymphs Abs: 2 K/uL (ref 0.7–4.0)
MCH: 26.6 pg (ref 26.0–34.0)
MCHC: 33 g/dL (ref 30.0–36.0)
MCV: 80.8 fL (ref 80.0–100.0)
Monocytes Absolute: 0.4 K/uL (ref 0.1–1.0)
Monocytes Relative: 4 %
Neutro Abs: 7.8 K/uL — ABNORMAL HIGH (ref 1.7–7.7)
Neutrophils Relative %: 76 %
Platelets: 341 K/uL (ref 150–400)
RBC: 5.67 MIL/uL (ref 4.22–5.81)
RDW: 13.2 % (ref 11.5–15.5)
WBC: 10.4 K/uL (ref 4.0–10.5)
nRBC: 0 % (ref 0.0–0.2)

## 2019-06-29 MED ORDER — HALOPERIDOL LACTATE 5 MG/ML IJ SOLN
2.0000 mg | Freq: Once | INTRAMUSCULAR | Status: AC
  Administered 2019-06-29: 2 mg via INTRAVENOUS
  Filled 2019-06-29: qty 1

## 2019-06-29 MED ORDER — HYOSCYAMINE SULFATE 0.125 MG PO TABS: 0.1250 mg | ORAL_TABLET | ORAL | 1 refills | Status: DC | PRN

## 2019-06-29 MED ORDER — FENTANYL CITRATE (PF) 100 MCG/2ML IJ SOLN
100.0000 ug | INTRAMUSCULAR | Status: DC | PRN
  Administered 2019-06-29: 100 ug via INTRAVENOUS
  Filled 2019-06-29: qty 2

## 2019-06-29 MED ORDER — SODIUM CHLORIDE 0.9 % IV SOLN: INTRAVENOUS | Status: DC

## 2019-06-29 MED ORDER — SODIUM CHLORIDE 0.9 % IV BOLUS
1000.0000 mL | Freq: Once | INTRAVENOUS | Status: AC
  Administered 2019-06-29: 1000 mL via INTRAVENOUS

## 2019-06-29 MED ORDER — ONDANSETRON HCL 4 MG/2ML IJ SOLN
4.0000 mg | Freq: Once | INTRAMUSCULAR | Status: AC
  Administered 2019-06-29: 4 mg via INTRAVENOUS
  Filled 2019-06-29: qty 2

## 2019-06-29 NOTE — ED Triage Notes (Signed)
Per [facility] pt from home for abd pains that he has been seen for multiple times. Taken lots of Zofran in past 2 days.

## 2019-06-29 NOTE — ED Provider Notes (Signed)
[facility]-EMERGENCY DEPT Provider Note   CSN: [id number] Arrival date & time: 06/29/19  1001     History   Chief Complaint Chief Complaint  Patient presents with  . Abdominal Pain    HPI Jonathan Porter is a 28 y.o. male.     HPI  He presents, by EMS from home for evaluation abdominal pain.  Seen several times for this recently including yesterday.  Using Zofran for vomiting, without relief.  Concern has been raised for hyperemesis cannabinoid syndrome.  He reports having abdominal pain for at least a week, with a similar episode about a month ago.  He uses marijuana.  He states he takes his insulin as directed, both Lantus and Humalog, with blood sugars running in the mid 200s.  He denies fever, cough, shortness of breath, chest pain, weakness or dizziness.  There are no other known modifying factors.  Past Medical History:  Diagnosis Date  . Diabetes mellitus without complication (HCC)     Patient Active Problem List   Diagnosis Date Noted  . Influenza vaccine refused 06/03/2019  . Erectile dysfunction 06/03/2019  . Obesity (BMI 30-39.9) 06/03/2019  . Type 1 diabetes mellitus with diabetic polyneuropathy (HCC) 01/21/2019  . Type 1 diabetes mellitus with hyperglycemia (HCC) 01/21/2019  . Paronychia of finger of left hand 01/20/2017  . 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 Diabetes type 1, uncontrolled (HCC) 08/23/2015  . Onychomycosis of toenail 08/23/2015  . Phimosis 08/07/2014    Past Surgical History:  Procedure Laterality Date  . NO PAST SURGERIES          Home Medications    Prior to Admission medications   Medication Sig Start Date End Date Taking? Authorizing Provider  bismuth subsalicylate (PEPTO BISMOL) 262 MG/15ML suspension Take 30 mLs by mouth every 6 (six) hours as needed for indigestion or diarrhea or loose stools.   Yes [provider]  imiquimod (ALDARA) 5 % cream Apply a thin layer 3 times  per week (on alternate days) prior to bedtime; leave on skin for 6 to 10 hours, then remove with mild soap and water. Continue until there is total clearance of the genital/perianal warts or for a maximum duration of therapy of 16 weeks 07/02/18  Yes Johnson, Deborah B, MD  insulin aspart (NOVOLOG) 100 UNIT/ML FlexPen Inject 15 Units into the skin 3 (three) times daily with meals. 09/28/18  Yes Johnson, Deborah B, MD  Insulin Glargine (LANTUS SOLOSTAR) 100 UNIT/ML Solostar Pen Inject 15 Units into the skin daily at 10 pm. 09/28/18  Yes Johnson, Deborah B, MD  ondansetron (ZOFRAN ODT) 4 MG disintegrating tablet Take 1 tablet (4 mg total) by mouth every 8 (eight) hours as needed for nausea or vomiting. 06/24/19  Yes Browning, Robert, PA-C  Accu-Chek FastClix Lancets MISC Use to check blood sugar up to 3 times daily. 06/03/19   Johnson, Deborah B, MD  Blood Glucose Monitoring Suppl (ACCU-CHEK GUIDE ME) w/Device KIT 1 kit by Does not apply route 3 (three) times daily. Use to check blood sugar up to 3 times daily. 06/03/19   Johnson, Deborah B, MD  dicyclomine (BENTYL) 20 MG tablet Take 1 tablet (20 mg total) by mouth 2 (two) times daily. 04/11/19   Plunkett, Whitney, MD  gabapentin (NEURONTIN) 100 MG capsule Take 1 capsule (100 mg total) by mouth 3 (three) times daily. Patient not taking: Reported on 09/28/2018 08/25/18   Fleming, Zelda W, NP  glucose blood (ACCU-CHEK GUIDE)   test strip 4x a day 06/03/19   Ladell Pier, MD  glucose blood (TRUE METRIX BLOOD GLUCOSE TEST) test strip 1 each by Other route 3 (three) times daily. Patient not taking: Reported on 03/03/2019 09/28/18   Ladell Pier, MD  hyoscyamine (LEVSIN) 0.125 MG tablet Take 1 tablet (0.125 mg total) by mouth every 4 (four) hours as needed for cramping. 06/29/19   Daleen Bo, MD  Insulin Pen Needle (B-D ULTRAFINE III SHORT PEN) 31G X 8 MM MISC 1 application by Does not apply route 4 (four) times daily. 05/21/18   Ladell Pier, MD   metoCLOPramide (REGLAN) 10 MG tablet Take 1 tablet (10 mg total) by mouth every 6 (six) hours. Patient not taking: Reported on 06/23/2019 04/11/19   Blanchie Dessert, MD  nicotine (NICODERM CQ - DOSED IN MG/24 HOURS) 14 mg/24hr patch Place 1 patch (14 mg total) onto the skin daily. Patient not taking: Reported on 01/21/2019 09/28/18   Ladell Pier, MD  nicotine (NICODERM CQ - DOSED IN MG/24 HR) 7 mg/24hr patch Place 1 patch (7 mg total) onto the skin daily. Patient not taking: Reported on 06/23/2019 06/03/19   Ladell Pier, MD  omeprazole (PRILOSEC) 20 MG capsule Take 1 capsule (20 mg total) by mouth daily. Patient not taking: Reported on 06/23/2019 04/11/19   Blanchie Dessert, MD  sildenafil (VIAGRA) 50 MG tablet TAKE 1 TABLET BY MOUTH A HALF HOUR PRIOR TO SEXUAL INTERCOURSE AS NEEDED. *LIMIT TO 1 TABLET PER 24 HOURS* Patient taking differently: Take 50 mg by mouth as needed for erectile dysfunction. TAKE 1 TABLET BY MOUTH A HALF HOUR PRIOR TO SEXUAL INTERCOURSE AS NEEDED. *LIMIT TO 1 TABLET PER 24 HOURS* 06/03/19   Ladell Pier, MD  triamcinolone cream (KENALOG) 0.1 % Apply 1 application topically 2 (two) times daily. Patient not taking: Reported on 06/23/2019 03/03/19   Argentina Donovan, PA-C    Family History Family History  Problem Relation Age of Onset  . Thyroid disease Mother   . Diabetes Maternal Aunt   . Diabetes Maternal Grandmother   . Diabetes Maternal Grandfather   . Thyroid disease Other     Social History Social History   Tobacco Use  . Smoking status: Current Every Day Smoker    Packs/day: 0.25    Years: 6.00    Pack years: 1.50    Types: Cigarettes  . Smokeless tobacco: Never Used  Substance Use Topics  . Alcohol use: Yes    Comment: social   . Drug use: Yes    Types: Marijuana    Comment: last used 04/17/2016     Allergies   Shellfish allergy   Review of Systems Review of Systems  All other systems reviewed and are negative.    Physical  Exam Updated Vital Signs BP (!) 132/93 (BP Location: Left Arm)   Pulse 69   Temp 98.3 F (36.8 C) (Oral)   Resp 18   SpO2 100%   Physical Exam Vitals signs and nursing note reviewed.  Constitutional:      Appearance: He is well-developed.  HENT:     Head: Normocephalic and atraumatic.     Right Ear: External ear normal.     Left Ear: External ear normal.  Eyes:     Conjunctiva/sclera: Conjunctivae normal.     Pupils: Pupils are equal, round, and reactive to light.  Neck:     Musculoskeletal: Normal range of motion and neck supple.     Trachea: Phonation  normal.  Cardiovascular:     Rate and Rhythm: Normal rate and regular rhythm.     Heart sounds: Normal heart sounds.  Pulmonary:     Effort: Pulmonary effort is normal.     Breath sounds: Normal breath sounds.  Abdominal:     General: There is no distension.     Palpations: Abdomen is soft. There is no mass.     Tenderness: There is abdominal tenderness (Diffuse, mild). There is no guarding or rebound.  Musculoskeletal: Normal range of motion.        General: No swelling or tenderness.  Skin:    General: Skin is warm and dry.     Coloration: Skin is not jaundiced or pale.  Neurological:     Mental Status: He is alert and oriented to person, place, and time.     Cranial Nerves: No cranial nerve deficit.     Sensory: No sensory deficit.     Motor: No abnormal muscle tone.     Coordination: Coordination normal.  Psychiatric:        Mood and Affect: Mood normal.        Behavior: Behavior normal.        Thought Content: Thought content normal.        Judgment: Judgment normal.      ED Treatments / Results  Labs (all labs ordered are listed, but only abnormal results are displayed) Labs Reviewed  COMPREHENSIVE METABOLIC PANEL - Abnormal; Notable for the following components:      Result Value   Potassium 3.2 (*)    Glucose, Bld 143 (*)    Total Protein 8.3 (*)    AST 14 (*)    All other components within normal  limits  CBC WITH DIFFERENTIAL/PLATELET - Abnormal; Notable for the following components:   Neutro Abs 7.8 (*)    All other components within normal limits  SARS CORONAVIRUS 2 (TAT 6-24 HRS)    EKG None  Radiology No results found.  Procedures Procedures (including critical care time)  Medications Ordered in ED Medications  sodium chloride 0.9 % bolus 1,000 mL (0 mLs Intravenous Stopped 06/29/19 1857)  ondansetron (ZOFRAN) injection 4 mg (4 mg Intravenous Given 06/29/19 1527)  haloperidol lactate (HALDOL) injection 2 mg (2 mg Intravenous Given 06/29/19 1526)     Initial Impression / Assessment and Plan / ED Course  I have reviewed the triage vital signs and the nursing notes.  Pertinent labs & imaging results that were available during my care of the patient were reviewed by me and considered in my medical decision making (see chart for details).         Patient Vitals for the past 24 hrs:  BP Temp Temp src Pulse Resp SpO2  06/29/19 1734 (!) 132/93 - - 69 18 100 %  06/29/19 1442 (!) 147/98 - - 60 18 100 %  06/29/19 1008 (!) 130/107 98.3 F (36.8 C) Oral 72 20 100 %    At discharge- reevaluation with update and discussion. After initial assessment and treatment, an updated evaluation reveals he states he feels better and is ready to go home.  Findings discussed and questions answered.     Medical Decision Making: Recurrent abdominal pain with vomiting, improved with treatment.  Patient has failed prior treatments.  Patient medication changed to Levsin for abdominal discomfort and referred to gastroenterology.  Doubt serious bacterial infection, metabolic instability or acute intra-abdominal abnormality.  CRITICAL CARE-no Performed by:    Nursing Notes Reviewed/   Care Coordinated Applicable Imaging Reviewed Interpretation of Laboratory Data incorporated into ED treatment  The patient appears reasonably screened and/or stabilized for discharge and  I doubt any other medical condition or other EMC requiring further screening, evaluation, or treatment in the ED at this time prior to discharge.  Plan: Home Medications-OTC as needed; Home Treatments-gradually advance diet; return here if the recommended treatment, does not improve the symptoms; Recommended follow up-PCP, PRN    Final Clinical Impressions(s) / ED Diagnoses   Final diagnoses:  Generalized abdominal pain    ED Discharge Orders         Ordered    hyoscyamine (LEVSIN) 0.125 MG tablet  Every 4 hours PRN     06/29/19 1845           , , MD 06/29/19 2221  

## 2019-06-29 NOTE — Discharge Instructions (Addendum)
Try the new medicine prescribed today.  Call the GI doctor listed to get an appointment to be seen for further care and treatment.

## 2019-06-30 ENCOUNTER — Encounter (HOSPITAL_COMMUNITY): Payer: Self-pay | Admitting: Emergency Medicine

## 2019-06-30 ENCOUNTER — Other Ambulatory Visit: Payer: Self-pay

## 2019-06-30 ENCOUNTER — Emergency Department (HOSPITAL_COMMUNITY)
Admission: EM | Admit: 2019-06-30 | Discharge: 2019-06-30 | Disposition: A | Discharge status: Home / Self Care | Payer: Medicaid Other | Attending: Emergency Medicine | Admitting: Emergency Medicine

## 2019-06-30 ENCOUNTER — Encounter: Payer: Self-pay | Admitting: Physician Assistant

## 2019-06-30 DIAGNOSIS — Z5321 Procedure and treatment not carried out due to patient leaving prior to being seen by health care provider: Secondary | ICD-10-CM | POA: Diagnosis not present

## 2019-06-30 DIAGNOSIS — R109 Unspecified abdominal pain: Secondary | ICD-10-CM | POA: Diagnosis present

## 2019-06-30 LAB — COMPREHENSIVE METABOLIC PANEL
ALT: 12 U/L (ref 0–44)
AST: 12 U/L — ABNORMAL LOW (ref 15–41)
Albumin: 4.5 g/dL (ref 3.5–5.0)
Alkaline Phosphatase: 98 U/L (ref 38–126)
Anion gap: 12 (ref 5–15)
BUN: 9 mg/dL (ref 6–20)
CO2: 23 mmol/L (ref 22–32)
Calcium: 9.3 mg/dL (ref 8.9–10.3)
Chloride: 102 mmol/L (ref 98–111)
Creatinine, Ser: 0.99 mg/dL (ref 0.61–1.24)
GFR calc Af Amer: >60 mL/min (ref >60)
GFR calc non Af Amer: >60 mL/min (ref >60)
Glucose, Bld: 159 mg/dL — ABNORMAL HIGH (ref 70–99)
Potassium: 3.3 mmol/L — ABNORMAL LOW (ref 3.5–5.1)
Sodium: 137 mmol/L (ref 135–145)
Total Bilirubin: 1 mg/dL (ref 0.3–1.2)
Total Protein: 7.7 g/dL (ref 6.5–8.1)

## 2019-06-30 LAB — CBC
HCT: 44.7 % (ref 39.0–52.0)
Hemoglobin: 14.7 g/dL (ref 13.0–17.0)
MCH: 26.4 pg (ref 26.0–34.0)
MCHC: 32.9 g/dL (ref 30.0–36.0)
MCV: 80.4 fL (ref 80.0–100.0)
Platelets: 357 10*3/uL (ref 150–400)
RBC: 5.56 MIL/uL (ref 4.22–5.81)
RDW: 13.2 % (ref 11.5–15.5)
WBC: 9.1 10*3/uL (ref 4.0–10.5)
nRBC: 0 % (ref 0.0–0.2)

## 2019-06-30 LAB — SARS CORONAVIRUS 2 (TAT 6-24 HRS): SARS Coronavirus 2: NEGATIVE

## 2019-06-30 LAB — LIPASE, BLOOD: Lipase: 15 U/L (ref 11–51)

## 2019-06-30 MED ORDER — SODIUM CHLORIDE 0.9% FLUSH: 3.0000 mL | Freq: Once | INTRAVENOUS | Status: DC

## 2019-06-30 NOTE — ED Triage Notes (Signed)
Patient c/o abdominal pain. States "I have been seen every day this week for the same."

## 2019-07-01 ENCOUNTER — Emergency Department (HOSPITAL_COMMUNITY): Payer: Medicaid Other

## 2019-07-01 ENCOUNTER — Encounter (HOSPITAL_COMMUNITY): Payer: Self-pay

## 2019-07-01 ENCOUNTER — Emergency Department (HOSPITAL_COMMUNITY)
Admission: EM | Admit: 2019-07-01 | Discharge: 2019-07-01 | Disposition: A | Discharge status: Home / Self Care | Payer: Medicaid Other | Attending: Emergency Medicine | Admitting: Emergency Medicine

## 2019-07-01 ENCOUNTER — Other Ambulatory Visit: Payer: Self-pay

## 2019-07-01 ENCOUNTER — Emergency Department (HOSPITAL_COMMUNITY)
Admission: EM | Admit: 2019-07-01 | Discharge: 2019-07-01 | Disposition: A | Discharge status: Home / Self Care | Payer: Medicaid Other | Source: Home / Self Care | Attending: Emergency Medicine | Admitting: Emergency Medicine

## 2019-07-01 DIAGNOSIS — Z79899 Other long term (current) drug therapy: Secondary | ICD-10-CM | POA: Insufficient documentation

## 2019-07-01 DIAGNOSIS — F17210 Nicotine dependence, cigarettes, uncomplicated: Secondary | ICD-10-CM | POA: Insufficient documentation

## 2019-07-01 DIAGNOSIS — E876 Hypokalemia: Secondary | ICD-10-CM | POA: Insufficient documentation

## 2019-07-01 DIAGNOSIS — E109 Type 1 diabetes mellitus without complications: Secondary | ICD-10-CM | POA: Insufficient documentation

## 2019-07-01 DIAGNOSIS — R109 Unspecified abdominal pain: Secondary | ICD-10-CM | POA: Diagnosis present

## 2019-07-01 DIAGNOSIS — R112 Nausea with vomiting, unspecified: Secondary | ICD-10-CM | POA: Insufficient documentation

## 2019-07-01 DIAGNOSIS — K3184 Gastroparesis: Secondary | ICD-10-CM

## 2019-07-01 DIAGNOSIS — Z794 Long term (current) use of insulin: Secondary | ICD-10-CM | POA: Insufficient documentation

## 2019-07-01 DIAGNOSIS — F1210 Cannabis abuse, uncomplicated: Secondary | ICD-10-CM | POA: Insufficient documentation

## 2019-07-01 LAB — COMPREHENSIVE METABOLIC PANEL
ALT: 13 U/L (ref 0–44)
ALT: 13 U/L (ref 0–44)
AST: 14 U/L — ABNORMAL LOW (ref 15–41)
AST: 17 U/L (ref 15–41)
Albumin: 4.6 g/dL (ref 3.5–5.0)
Albumin: 5.1 g/dL — ABNORMAL HIGH (ref 3.5–5.0)
Alkaline Phosphatase: 91 U/L (ref 38–126)
Alkaline Phosphatase: 97 U/L (ref 38–126)
Anion gap: 13 (ref 5–15)
Anion gap: 12 (ref 5–15)
BUN: 10 mg/dL (ref 6–20)
BUN: 8 mg/dL (ref 6–20)
CO2: 27 mmol/L (ref 22–32)
CO2: 23 mmol/L (ref 22–32)
Calcium: 9.5 mg/dL (ref 8.9–10.3)
Calcium: 9.6 mg/dL (ref 8.9–10.3)
Chloride: 101 mmol/L (ref 98–111)
Chloride: 100 mmol/L (ref 98–111)
Creatinine, Ser: 1.01 mg/dL (ref 0.61–1.24)
Creatinine, Ser: 0.91 mg/dL (ref 0.61–1.24)
GFR calc Af Amer: >60 mL/min (ref >60)
GFR calc Af Amer: >60 mL/min (ref >60)
GFR calc non Af Amer: >60 mL/min (ref >60)
GFR calc non Af Amer: >60 mL/min (ref >60)
Glucose, Bld: 55 mg/dL — ABNORMAL LOW (ref 70–99)
Glucose, Bld: 132 mg/dL — ABNORMAL HIGH (ref 70–99)
Potassium: 2.8 mmol/L — ABNORMAL LOW (ref 3.5–5.1)
Potassium: 3.1 mmol/L — ABNORMAL LOW (ref 3.5–5.1)
Sodium: 141 mmol/L (ref 135–145)
Sodium: 135 mmol/L (ref 135–145)
Total Bilirubin: 0.8 mg/dL (ref 0.3–1.2)
Total Bilirubin: 1.2 mg/dL (ref 0.3–1.2)
Total Protein: 7.8 g/dL (ref 6.5–8.1)
Total Protein: 8.6 g/dL — ABNORMAL HIGH (ref 6.5–8.1)

## 2019-07-01 LAB — CBG MONITORING, ED
Glucose-Capillary: 57 mg/dL — ABNORMAL LOW (ref 70–99)
Glucose-Capillary: 62 mg/dL — ABNORMAL LOW (ref 70–99)
Glucose-Capillary: 127 mg/dL — ABNORMAL HIGH (ref 70–99)

## 2019-07-01 LAB — CBC
HCT: 45.7 % (ref 39.0–52.0)
Hemoglobin: 15.1 g/dL (ref 13.0–17.0)
MCH: 26.7 pg (ref 26.0–34.0)
MCHC: 33 g/dL (ref 30.0–36.0)
MCV: 80.7 fL (ref 80.0–100.0)
Platelets: 376 10*3/uL (ref 150–400)
RBC: 5.66 MIL/uL (ref 4.22–5.81)
RDW: 13.5 % (ref 11.5–15.5)
WBC: 9.1 10*3/uL (ref 4.0–10.5)
nRBC: 0 % (ref 0.0–0.2)

## 2019-07-01 LAB — CBC WITH DIFFERENTIAL/PLATELET
Abs Immature Granulocytes: 0.03 10*3/uL (ref 0.00–0.07)
Basophils Absolute: 0.1 10*3/uL (ref 0.0–0.1)
Basophils Relative: 1 %
Eosinophils Absolute: 0.1 10*3/uL (ref 0.0–0.5)
Eosinophils Relative: 1 %
HCT: 46.4 % (ref 39.0–52.0)
Hemoglobin: 15.6 g/dL (ref 13.0–17.0)
Immature Granulocytes: 0 %
Lymphocytes Relative: 27 %
Lymphs Abs: 2.3 10*3/uL (ref 0.7–4.0)
MCH: 27 pg (ref 26.0–34.0)
MCHC: 33.6 g/dL (ref 30.0–36.0)
MCV: 80.3 fL (ref 80.0–100.0)
Monocytes Absolute: 0.5 10*3/uL (ref 0.1–1.0)
Monocytes Relative: 6 %
Neutro Abs: 5.6 10*3/uL (ref 1.7–7.7)
Neutrophils Relative %: 65 %
Platelets: 357 10*3/uL (ref 150–400)
RBC: 5.78 MIL/uL (ref 4.22–5.81)
RDW: 13.4 % (ref 11.5–15.5)
WBC: 8.7 10*3/uL (ref 4.0–10.5)
nRBC: 0 % (ref 0.0–0.2)

## 2019-07-01 LAB — URINALYSIS, ROUTINE W REFLEX MICROSCOPIC
Bilirubin Urine: NEGATIVE
Glucose, UA: NEGATIVE mg/dL
Hgb urine dipstick: NEGATIVE
Ketones, ur: 5 mg/dL — AB
Leukocytes,Ua: NEGATIVE
Nitrite: NEGATIVE
Protein, ur: NEGATIVE mg/dL
Specific Gravity, Urine: 1.016 (ref 1.005–1.030)
pH: 6 (ref 5.0–8.0)

## 2019-07-01 LAB — LIPASE, BLOOD
Lipase: 18 U/L (ref 11–51)
Lipase: 15 U/L (ref 11–51)

## 2019-07-01 MED ORDER — HYDROMORPHONE HCL 1 MG/ML IJ SOLN
1.0000 mg | Freq: Once | INTRAMUSCULAR | Status: AC
  Administered 2019-07-01: 16:00:00 1 mg via INTRAVENOUS
  Filled 2019-07-01: qty 1

## 2019-07-01 MED ORDER — ONDANSETRON HCL 4 MG/2ML IJ SOLN
4.0000 mg | Freq: Once | INTRAMUSCULAR | Status: AC
  Administered 2019-07-01: 16:00:00 4 mg via INTRAVENOUS
  Filled 2019-07-01: qty 2

## 2019-07-01 MED ORDER — IOHEXOL 300 MG/ML  SOLN
100.0000 mL | Freq: Once | INTRAMUSCULAR | Status: AC | PRN
  Administered 2019-07-01: 100 mL via INTRAVENOUS

## 2019-07-01 MED ORDER — HALOPERIDOL LACTATE 5 MG/ML IJ SOLN
2.0000 mg | Freq: Once | INTRAMUSCULAR | Status: AC
  Administered 2019-07-01: 09:00:00 2 mg via INTRAVENOUS
  Filled 2019-07-01: qty 1

## 2019-07-01 MED ORDER — POTASSIUM CHLORIDE 10 MEQ/100ML IV SOLN
10.0000 meq | Freq: Once | INTRAVENOUS | Status: AC
  Administered 2019-07-01: 10 meq via INTRAVENOUS
  Filled 2019-07-01: qty 100

## 2019-07-01 MED ORDER — HYDROCODONE-ACETAMINOPHEN 5-325 MG PO TABS: 1.0000 | ORAL_TABLET | ORAL | 0 refills | Status: DC | PRN

## 2019-07-01 MED ORDER — POTASSIUM CHLORIDE ER 10 MEQ PO TBCR: 20.0000 meq | EXTENDED_RELEASE_TABLET | Freq: Every day | ORAL | 0 refills | Status: DC

## 2019-07-01 MED ORDER — SODIUM CHLORIDE 0.9 % IV BOLUS
1000.0000 mL | Freq: Once | INTRAVENOUS | Status: AC
  Administered 2019-07-01: 16:00:00 1000 mL via INTRAVENOUS

## 2019-07-01 MED ORDER — ONDANSETRON 8 MG PO TBDP: ORAL_TABLET | ORAL | 0 refills | Status: DC

## 2019-07-01 MED ORDER — POTASSIUM CHLORIDE CRYS ER 20 MEQ PO TBCR
40.0000 meq | EXTENDED_RELEASE_TABLET | Freq: Once | ORAL | Status: AC
  Administered 2019-07-01: 10:00:00 40 meq via ORAL
  Filled 2019-07-01: qty 2

## 2019-07-01 MED ORDER — KETOROLAC TROMETHAMINE 30 MG/ML IJ SOLN
30.0000 mg | Freq: Once | INTRAMUSCULAR | Status: AC
  Administered 2019-07-01: 30 mg via INTRAVENOUS
  Filled 2019-07-01: qty 1

## 2019-07-01 MED ORDER — SODIUM CHLORIDE 0.9 % IV BOLUS
1000.0000 mL | Freq: Once | INTRAVENOUS | Status: AC
  Administered 2019-07-01: 08:00:00 1000 mL via INTRAVENOUS

## 2019-07-01 MED ORDER — SODIUM CHLORIDE 0.9% FLUSH
3.0000 mL | Freq: Once | INTRAVENOUS | Status: AC
  Administered 2019-07-01: 08:00:00 3 mL via INTRAVENOUS

## 2019-07-01 NOTE — ED Notes (Signed)
Patient was given urinal to provide urine sample.

## 2019-07-01 NOTE — ED Notes (Signed)
Patient given discharge teaching and verbalized understanding. Patient ambulated out of ED with a steady gait. 

## 2019-07-01 NOTE — TOC Initial Note (Signed)
Transition of Care [facility]) - Initial/Assessment Note    Patient Details  Name: Jonathan Porter MRN: [id number] Date of Birth: 12-27-1990  Transition of [facility]) CM/SW Contact:    Erenest Rasher, RN Phone Number:  [phone] 07/01/2019, 6:13 PM  Clinical Narrative:  10 ED visits in Six months                Spoke to pt at bedside. Reviewed diet for gastroparesis with pt added to dc instructions. States he was drinking soda and explained soda is not a food recommended. Pt has follow up with [facility] on 07/07/2019, encouraged him to discuss treatment options and diet to help manage gastroparesis.   PCP Karle Plumber MD   Expected Discharge Plan: Home/Self Care Barriers to Discharge: No Barriers Identified   Patient Goals and CMS Choice        Expected Discharge Plan and Services Expected Discharge Plan: Home/Self Care   Discharge Planning Services: CM Consult   Living arrangements for the past 2 months: Apartment                                      Prior Living Arrangements/Services Living arrangements for the past 2 months: Apartment Lives with:: Self Patient language and need for interpreter reviewed:: Yes Do you feel safe going back to the place where you live?: Yes      Need for Family Participation in Patient Care: No (Comment) Care giver support system in place?: No (comment)   Criminal Activity/Legal Involvement Pertinent to Current Situation/Hospitalization: No - Comment as needed  Activities of Daily Living      Permission Sought/Granted Permission sought to share information with : Case Manager, Family Supports Permission granted to share information with : Yes, Verbal Permission Granted              Emotional Assessment Appearance:: Other (Comment Required Attitude/Demeanor/Rapport: Gracious Affect (typically observed): Accepting Orientation: : Oriented to Self, Oriented to Place, Oriented to  Time, Oriented to  Situation Alcohol / Substance Use: Tobacco Use Psych Involvement: No (comment)  Admission diagnosis:  abd pain Patient Active Problem List   Diagnosis Date Noted  . Influenza vaccine refused 06/03/2019  . Erectile dysfunction 06/03/2019  . Obesity (BMI 30-39.9) 06/03/2019  . Type 1 diabetes mellitus with diabetic polyneuropathy ([place]) 01/21/2019  . Type 1 diabetes mellitus with hyperglycemia ([place]) 01/21/2019  . Paronychia of finger of left hand 01/20/2017  . 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 Diabetes type 1, uncontrolled ([place]) 08/23/2015  . Onychomycosis of toenail 08/23/2015  . Phimosis 08/07/2014   PCP:  Ladell Pier, MD Pharmacy:   [facility], [address] [place] [address] Phone: [phone] Fax: [phone]     Social Determinants of Health (SDOH) Interventions    Readmission Risk Interventions No flowsheet data found.

## 2019-07-01 NOTE — ED Notes (Signed)
EKG handed to MD Nanavati  

## 2019-07-01 NOTE — ED Notes (Signed)
Patient was given orange juice and graham crackers w/ peanut butter.

## 2019-07-01 NOTE — ED Notes (Signed)
Patient ambulated to the bathroom w/ a steady gate. 

## 2019-07-01 NOTE — ED Provider Notes (Signed)
[facility] DEPT Provider Note   CSN: [id number] Arrival date & time: 07/01/19  0518     History   Chief Complaint Chief Complaint  Patient presents with  . Abdominal Pain    HPI Jonathan Porter is a 28 y.o. male history of diabetes, gastroparesis, marijuana use presents today for abdominal pain nausea and vomiting x1 week.  Multiple ED visits for the same.  Reports that after each ED visit he feels improved however symptoms returned shortly afterwards.  He describes a generalized cramping aching abdominal pain waxing and waning constant without clear aggravating or alleviating factors.  Patient reports multiple episodes of nonbloody/nonbilious emesis this week.  Patient endorses is some marijuana use however reports he has not used in the past 3-4 days as he has been told it may be related to his symptoms.  Denies fever/chills, headache, chest pain/shortness of breath, constipation/diarrhea, fall/injury, extremity pain/swelling, rash, dysuria/hematuria, testicular pain/swelling, penile discharge or any additional concerns.     HPI  Past Medical History:  Diagnosis Date  . Diabetes mellitus without complication [facility])     Patient Active Problem List   Diagnosis Date Noted  . Influenza vaccine refused 06/03/2019  . Erectile dysfunction 06/03/2019  . Obesity (BMI 30-39.9) 06/03/2019  . Type 1 diabetes mellitus with diabetic polyneuropathy ([place]) 01/21/2019  . Type 1 diabetes mellitus with hyperglycemia ([place]) 01/21/2019  . Paronychia of finger of left hand 01/20/2017  . 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 Diabetes type 1, uncontrolled ([place]) 08/23/2015  . Onychomycosis of toenail 08/23/2015  . Phimosis 08/07/2014    Past Surgical History:  Procedure Laterality Date  . NO PAST SURGERIES          Home Medications    Prior to Admission medications   Medication Sig Start Date End Date Taking? Authorizing Provider   Accu-Chek FastClix Lancets MISC Use to check blood sugar up to 3 times daily. 06/03/19   Ladell Pier, MD  bismuth subsalicylate (PEPTO BISMOL) 262 MG/15ML suspension Take 30 mLs by mouth every 6 (six) hours as needed for indigestion or diarrhea or loose stools.    [provider]  Blood Glucose Monitoring Suppl (ACCU-CHEK GUIDE ME) w/Device KIT 1 kit by Does not apply route 3 (three) times daily. Use to check blood sugar up to 3 times daily. 06/03/19   Ladell Pier, MD  dicyclomine (BENTYL) 20 MG tablet Take 1 tablet (20 mg total) by mouth 2 (two) times daily. 04/11/19   Blanchie Dessert, MD  gabapentin (NEURONTIN) 100 MG capsule Take 1 capsule (100 mg total) by mouth 3 (three) times daily. Patient not taking: Reported on 09/28/2018 08/25/18   Gildardo Pounds, NP  glucose blood (ACCU-CHEK GUIDE) test strip 4x a day 06/03/19   Ladell Pier, MD  glucose blood (TRUE METRIX BLOOD GLUCOSE TEST) test strip 1 each by Other route 3 (three) times daily. Patient not taking: Reported on 03/03/2019 09/28/18   Ladell Pier, MD  hyoscyamine (LEVSIN) 0.125 MG tablet Take 1 tablet (0.125 mg total) by mouth every 4 (four) hours as needed for cramping. 06/29/19   Daleen Bo, MD  imiquimod [place]) 5 % cream Apply a thin layer 3 times per week (on alternate days) prior to bedtime; leave on skin for 6 to 10 hours, then remove with mild soap and water. Continue until there is total clearance of the genital/perianal warts or for a maximum duration of therapy of 16 weeks 07/02/18  Ladell Pier, MD  insulin aspart (NOVOLOG) 100 UNIT/ML FlexPen Inject 15 Units into the skin 3 (three) times daily with meals. 09/28/18   Ladell Pier, MD  Insulin Glargine (LANTUS SOLOSTAR) 100 UNIT/ML Solostar Pen Inject 15 Units into the skin daily at 10 pm. 09/28/18   Ladell Pier, MD  Insulin Pen Needle (B-D ULTRAFINE III SHORT PEN) 31G X 8 MM MISC 1 application by Does not apply route 4 (four) times  daily. 05/21/18   Ladell Pier, MD  metoCLOPramide (REGLAN) 10 MG tablet Take 1 tablet (10 mg total) by mouth every 6 (six) hours. Patient not taking: Reported on 06/23/2019 04/11/19   Blanchie Dessert, MD  nicotine (NICODERM CQ - DOSED IN MG/24 HOURS) 14 mg/24hr patch Place 1 patch (14 mg total) onto the skin daily. Patient not taking: Reported on 01/21/2019 09/28/18   Ladell Pier, MD  nicotine (NICODERM CQ - DOSED IN MG/24 HR) 7 mg/24hr patch Place 1 patch (7 mg total) onto the skin daily. Patient not taking: Reported on 06/23/2019 06/03/19   Ladell Pier, MD  omeprazole (PRILOSEC) 20 MG capsule Take 1 capsule (20 mg total) by mouth daily. Patient not taking: Reported on 06/23/2019 04/11/19   Blanchie Dessert, MD  ondansetron (ZOFRAN ODT) 4 MG disintegrating tablet Take 1 tablet (4 mg total) by mouth every 8 (eight) hours as needed for nausea or vomiting. 06/24/19   Montine Circle, PA-C  potassium chloride (KLOR-CON) 10 MEQ tablet Take 2 tablets (20 mEq total) by mouth daily for 3 days. 07/01/19 07/04/19  Nuala Alpha A, PA-C  sildenafil (VIAGRA) 50 MG tablet TAKE 1 TABLET BY MOUTH A HALF HOUR PRIOR TO SEXUAL INTERCOURSE AS NEEDED. *LIMIT TO 1 TABLET PER 24 HOURS* Patient taking differently: Take 50 mg by mouth as needed for erectile dysfunction. TAKE 1 TABLET BY MOUTH A HALF HOUR PRIOR TO SEXUAL INTERCOURSE AS NEEDED. *LIMIT TO 1 TABLET PER 24 HOURS* 06/03/19   Ladell Pier, MD  triamcinolone cream (KENALOG) 0.1 % Apply 1 application topically 2 (two) times daily. Patient not taking: Reported on 06/23/2019 03/03/19   Argentina Donovan, PA-C    Family History Family History  Problem Relation Age of Onset  . Thyroid disease Mother   . Diabetes Maternal Aunt   . Diabetes Maternal Grandmother   . Diabetes Maternal Grandfather   . Thyroid disease Other     Social History Social History   Tobacco Use  . Smoking status: Current Every Day Smoker    Packs/day: 0.25     Years: 6.00    Pack years: 1.50    Types: Cigarettes  . Smokeless tobacco: Never Used  Substance Use Topics  . Alcohol use: Yes    Comment: social   . Drug use: Yes    Types: Marijuana    Comment: last used 04/17/2016     Allergies   Shellfish allergy   Review of Systems Review of Systems Ten systems are reviewed and are negative for acute change except as noted in the HPI   Physical Exam Updated Vital Signs BP 121/90   Pulse 67   Temp 98.4 F (36.9 C) (Oral)   Resp 15   SpO2 100%   Physical Exam Constitutional:      General: He is not in acute distress.    Appearance: Normal appearance. He is well-developed. He is obese. He is not ill-appearing or diaphoretic.  HENT:     Head: Normocephalic and atraumatic.  Right Ear: External ear normal.     Left Ear: External ear normal.     Nose: Nose normal.  Eyes:     General: Vision grossly intact. Gaze aligned appropriately.     Pupils: Pupils are equal, round, and reactive to light.  Neck:     Musculoskeletal: Normal range of motion.     Trachea: Trachea and phonation normal. No tracheal deviation.  Pulmonary:     Effort: Pulmonary effort is normal. No respiratory distress.  Abdominal:     General: There is no distension.     Palpations: Abdomen is soft.     Tenderness: There is no abdominal tenderness. There is no guarding or rebound.  Musculoskeletal: Normal range of motion.  Skin:    General: Skin is warm and dry.  Neurological:     Mental Status: He is alert.     GCS: GCS eye subscore is 4. GCS verbal subscore is 5. GCS motor subscore is 6.     Comments: Speech is clear and goal oriented, follows commands Major Cranial nerves without deficit, no facial droop Moves extremities without ataxia, coordination intact  Psychiatric:        Behavior: Behavior normal.      ED Treatments / Results  Labs (all labs ordered are listed, but only abnormal results are displayed) Labs Reviewed  COMPREHENSIVE  METABOLIC PANEL - Abnormal; Notable for the following components:      Result Value   Potassium 2.8 (*)    Glucose, Bld 55 (*)    AST 14 (*)    All other components within normal limits  URINALYSIS, ROUTINE W REFLEX MICROSCOPIC - Abnormal; Notable for the following components:   Ketones, ur 5 (*)    All other components within normal limits  CBG MONITORING, ED - Abnormal; Notable for the following components:   Glucose-Capillary 57 (*)    All other components within normal limits  CBG MONITORING, ED - Abnormal; Notable for the following components:   Glucose-Capillary 62 (*)    All other components within normal limits  CBG MONITORING, ED - Abnormal; Notable for the following components:   Glucose-Capillary 127 (*)    All other components within normal limits  LIPASE, BLOOD  CBC    EKG EKG Interpretation  Date/Time:  Friday July 01 2019 08:24:01 EDT Ventricular Rate:  70 PR Interval:    QRS Duration: 88 QT Interval:  375 QTC Calculation: 405 R Axis:   75 Text Interpretation:  Sinus rhythm Borderline T abnormalities, anterior leads No acute changes No significant change since last tracing Confirmed by Varney Biles ([id number]) on 07/01/2019 8:47:32 AM   Radiology No results found.  Procedures Procedures (including critical care time)  Medications Ordered in ED Medications  sodium chloride flush (NS) 0.9 % injection 3 mL (3 mLs Intravenous Given 07/01/19 0812)  sodium chloride 0.9 % bolus 1,000 mL (0 mLs Intravenous Stopped 07/01/19 0925)  potassium chloride 10 mEq in 100 mL IVPB (0 mEq Intravenous Stopped 07/01/19 0908)  haloperidol lactate (HALDOL) injection 2 mg (2 mg Intravenous Given 07/01/19 0836)  potassium chloride SA (KLOR-CON) CR tablet 40 mEq (40 mEq Oral Given 07/01/19 1018)     Initial Impression / Assessment and Plan / ED Course  I have reviewed the triage vital signs and the nursing notes.  Pertinent labs & imaging results that were available during my  care of the patient were reviewed by me and considered in my medical decision making (see chart for details).  On initial evaluation patient overall well-appearing and in no acute distress.  Abdomen soft nontender and without peritoneal signs.  Is endorsing nausea and emesis with cramping abdominal pain for multiple days.  Vital signs stable.  Patient requesting food here in the ER.  Will obtain labs, see no indication for imaging at this time, suspect cyclical emesis, gastroparesis possible cannabis hyperemesis syndrome. - Urinalysis with 5 ketones, suspect secondary to dehydration, fluid bolus given Lipase within normal limits CBC within normal limits CBG initially low improved following orange juice and food here in the ER CMP with hypokalemia of 2.8, repletion begun in the ER we will continue outpatient, anion gap 13, bicarb normal  EKG: Sinus rhythm Borderline T abnormalities, anterior leads No acute changes No significant change since last tracing Confirmed by Varney Biles [phone]) on 07/01/2019 8:47:32 AM - No evidence of DKA today, patient treated with 2 mg Haldol with complete resolution of his symptoms.  He is tolerating p.o. here in the emergency department without difficulty and is requesting discharge.  Patient informed to stop using marijuana as this may be exacerbating his symptoms.  Encouraged to take all medications as prescribed by PCP.  He will be given continued potassium supplements over the next 3 days for his hypokalemia, patient encouraged to have recheck by PCP office next week.  With a soft nontender abdomen without peritoneal signs see no indication for imaging at this time.  Doubt appendicitis, cholecystitis, SBO, perforation, HHS/DKA or other acute pathologies at this time.  At this time there does not appear to be any evidence of an acute emergency medical condition and the patient appears stable for discharge with appropriate outpatient follow up. Diagnosis was  discussed with patient who verbalizes understanding of care plan and is agreeable to discharge. I have discussed return precautions with patient who verbalizes understanding of return precautions. Patient encouraged to follow-up with their PCP. All questions answered.  Patient has been discharged in good condition.  Patient's case discussed with Dr. Kathrynn Humble who agrees with plan to discharge with outpatient follow-up.   Note: Portions of this report may have been transcribed using voice recognition software. Every effort was made to ensure accuracy; however, inadvertent computerized transcription errors may still be present. Final Clinical Impressions(s) / ED Diagnoses   Final diagnoses:  Non-intractable vomiting with nausea, unspecified vomiting type  Hypokalemia    ED Discharge Orders         Ordered    potassium chloride (KLOR-CON) 10 MEQ tablet  Daily     07/01/19 1051           Gari Crown 07/01/19 1053    Varney Biles, MD 07/01/19 1137

## 2019-07-01 NOTE — ED Triage Notes (Signed)
Pt from home, was here earlier today and was discharged. Pt states he felt better when he left and took a nap at home, but woke up a few hours later in more pain than before. Pt had 1 occurrence of black emesis in the ED.

## 2019-07-01 NOTE — Discharge Instructions (Addendum)
Zofran as prescribed as needed for nausea.  Hydrocodone as prescribed as needed for pain.  Return to the emergency department if symptoms significantly worsen or change.   Gastroparesis  Gastroparesis is a condition in which food takes longer than normal to empty from the stomach. The condition is usually long-lasting (chronic). It may also be called delayed gastric emptying. There is no cure, but there are treatments and things that you can do at home to help relieve symptoms. Treating the underlying condition that causes gastroparesis can also help relieve symptoms. What are the causes? In many cases, the cause of this condition is not known. Possible causes include: A hormone (endocrine) disorder, such as hypothyroidism or diabetes. A nervous system disease, such as Parkinson's disease or multiple sclerosis. Cancer, infection, or surgery that affects the stomach or vagus nerve. The vagus nerve runs from your chest, through your neck, to the lower part of your brain. A connective tissue disorder, such as scleroderma. Certain medicines. What increases the risk? You are more likely to develop this condition if you: Have certain disorders or diseases, including: An endocrine disorder. An eating disorder. Amyloidosis. Scleroderma. Parkinson's disease. Multiple sclerosis. Cancer or infection of the stomach or the vagus nerve. Have had surgery on the stomach or vagus nerve. Take certain medicines. Are male. What are the signs or symptoms? Symptoms of this condition include: Feeling full after eating very little. Nausea. Vomiting. Heartburn. Abdominal bloating. Inconsistent blood sugar (glucose) levels on blood tests. Lack of appetite. Weight loss. Acid from the stomach coming up into the esophagus (gastroesophageal reflux). Sudden tightening (spasm) of the stomach, which can be painful. Symptoms may come and go. Some people may not notice any symptoms. How is this  diagnosed? This condition is diagnosed with tests, such as: Tests that check how long it takes food to move through the stomach and intestines. These tests include: Upper gastrointestinal (GI) series. For this test, you drink a liquid that shows up well on X-rays, and then X-rays will be taken of your intestines. Gastric emptying scintigraphy. For this test, you eat food that contains a small amount of radioactive material, and then scans are taken. Wireless capsule GI monitoring system. For this test, you swallow a pill (capsule) that records information about how foods and fluid move through your stomach. Gastric manometry. For this test, a tube is passed down your throat and into your stomach to measure electrical and muscular activity. Endoscopy. For this test, a long, thin tube is passed down your throat and into your stomach to check for problems in your stomach lining. Ultrasound. This test uses sound waves to create images of inside the body. This can help rule out gallbladder disease or pancreatitis as a cause of your symptoms. How is this treated? There is no cure for gastroparesis. Treatment may include: Treating the underlying cause. Managing your symptoms by making changes to your diet and exercise habits. Taking medicines to control nausea and vomiting and to stimulate stomach muscles. Getting food through a feeding tube in the hospital. This may be done in severe cases. Having surgery to insert a device into your body that helps improve stomach emptying and control nausea and vomiting (gastric neurostimulator). Follow these instructions at home: Take over-the-counter and prescription medicines only as told by your health care provider. Follow instructions from your health care provider about eating or drinking restrictions. Your health care provider may recommend that you: Eat smaller meals more often. Eat low-fat foods. Eat low-fiber forms of high-fiber  foods. For example, eat  cooked vegetables instead of raw vegetables. Have only liquid foods instead of solid foods. Liquid foods are easier to digest. Drink enough fluid to keep your urine pale yellow. Exercise as often as told by your health care provider. Keep all follow-up visits as told by your health care provider. This is important. Contact a health care provider if you: Notice that your symptoms do not improve with treatment. Have new symptoms. Get help right away if you: Have severe abdominal pain that does not improve with treatment. Have nausea that is severe or does not go away. Cannot drink fluids without vomiting. Summary Gastroparesis is a chronic condition in which food takes longer than normal to empty from the stomach. Symptoms include nausea, vomiting, heartburn, abdominal bloating, and loss of appetite. Eating smaller portions, and low-fat, low-fiber foods may help you manage your symptoms. Get help right away if you have severe abdominal pain. This information is not intended to replace advice given to you by your health care provider. Make sure you discuss any questions you have with your health care provider. Document Released: 09/08/2005 Document Revised: 12/07/2017 Document Reviewed: 07/14/2017 Elsevier Patient Education  2020 [employer].

## 2019-07-01 NOTE — ED Triage Notes (Signed)
Pt reports generalized abdominal pain. He states that he has been seen almost every day this week for the same. He states that the medication that he has received is not helping. He also reports that he has been vomiting since 430a.

## 2019-07-01 NOTE — ED Provider Notes (Signed)
[facility] DEPT Provider Note   CSN: [id number] Arrival date & time: 07/01/19  1458     History   Chief Complaint Chief Complaint  Patient presents with   Abdominal Pain   Nausea    HPI Jonathan Porter is a 28 y.o. male.     Patient is a 28 year old male with history of type 1 diabetes and diabetic gastroparesis.  He presents today with complaints of abdominal cramping and vomiting.  Patient has presented to the emergency department multiple times this week with similar complaints, most recently this morning.  He has been discharged each time, but symptoms returned shortly after discharge.  He denies any bloody stool or vomit.  He denies any fevers or chills.  Patient does admit to daily marijuana use but tells me he has not used this in 5 days.  The history is provided by the patient.  Abdominal Pain Pain location:  Generalized Pain quality: cramping   Pain radiates to:  Does not radiate Pain severity:  Severe Onset quality:  Sudden Timing:  Constant Progression:  Worsening Chronicity:  Recurrent Context: not alcohol use     Past Medical History:  Diagnosis Date   Diabetes mellitus without complication ([place])     Patient Active Problem List   Diagnosis Date Noted   Influenza vaccine refused 06/03/2019   Erectile dysfunction 06/03/2019   Obesity (BMI 30-39.9) 06/03/2019   Type 1 diabetes mellitus with diabetic polyneuropathy ([place]) 01/21/2019   Type 1 diabetes mellitus with hyperglycemia ([place]) 01/21/2019   Paronychia of finger of left hand 01/20/2017   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 Authorizing  Provider  Accu-Chek FastClix Lancets MISC Use to check blood sugar up to 3 times daily. 06/03/19   Ladell Pier, MD  bismuth subsalicylate (PEPTO BISMOL) 262 MG/15ML suspension Take 30 mLs by mouth every 6 (six) hours as needed for indigestion or diarrhea or loose stools.    [provider]  Blood Glucose Monitoring Suppl (ACCU-CHEK GUIDE ME) w/Device KIT 1 kit by Does not apply route 3 (three) times daily. Use to check blood sugar up to 3 times daily. 06/03/19   Ladell Pier, MD  dicyclomine (BENTYL) 20 MG tablet Take 1 tablet (20 mg total) by mouth 2 (two) times daily. 04/11/19   Blanchie Dessert, MD  gabapentin (NEURONTIN) 100 MG capsule Take 1 capsule (100 mg total) by mouth 3 (three) times daily. Patient not taking: Reported on 09/28/2018 08/25/18   Gildardo Pounds, NP  glucose blood (ACCU-CHEK GUIDE) test strip 4x a day 06/03/19   Ladell Pier, MD  glucose blood (TRUE METRIX BLOOD GLUCOSE TEST) test strip 1 each by Other route 3 (three) times daily. Patient not taking: Reported on 03/03/2019 09/28/18   Ladell Pier, MD  hyoscyamine (LEVSIN) 0.125 MG tablet Take 1 tablet (0.125 mg total) by mouth every 4 (four) hours as needed for cramping. 06/29/19   Daleen Bo, MD  imiquimod Leroy Sea) 5 % cream Apply a thin layer 3 times per week (on alternate days) prior to bedtime; leave on skin for 6 to 10 hours, then remove with  mild soap and water. Continue until there is total clearance of the genital/perianal warts or for a maximum duration of therapy of 16 weeks 07/02/18   Ladell Pier, MD  insulin aspart (NOVOLOG) 100 UNIT/ML FlexPen Inject 15 Units into the skin 3 (three) times daily with meals. 09/28/18   Ladell Pier, MD  Insulin Glargine (LANTUS SOLOSTAR) 100 UNIT/ML Solostar Pen Inject 15 Units into the skin daily at 10 pm. 09/28/18   Ladell Pier, MD  Insulin Pen Needle (B-D ULTRAFINE III SHORT PEN) 31G X 8 MM MISC 1 application by Does not apply route 4  (four) times daily. 05/21/18   Ladell Pier, MD  metoCLOPramide (REGLAN) 10 MG tablet Take 1 tablet (10 mg total) by mouth every 6 (six) hours. Patient not taking: Reported on 06/23/2019 04/11/19   Blanchie Dessert, MD  nicotine (NICODERM CQ - DOSED IN MG/24 HOURS) 14 mg/24hr patch Place 1 patch (14 mg total) onto the skin daily. Patient not taking: Reported on 01/21/2019 09/28/18   Ladell Pier, MD  nicotine (NICODERM CQ - DOSED IN MG/24 HR) 7 mg/24hr patch Place 1 patch (7 mg total) onto the skin daily. Patient not taking: Reported on 06/23/2019 06/03/19   Ladell Pier, MD  omeprazole (PRILOSEC) 20 MG capsule Take 1 capsule (20 mg total) by mouth daily. Patient not taking: Reported on 06/23/2019 04/11/19   Blanchie Dessert, MD  ondansetron (ZOFRAN ODT) 4 MG disintegrating tablet Take 1 tablet (4 mg total) by mouth every 8 (eight) hours as needed for nausea or vomiting. 06/24/19   Montine Circle, PA-C  potassium chloride (KLOR-CON) 10 MEQ tablet Take 2 tablets (20 mEq total) by mouth daily for 3 days. 07/01/19 07/04/19  Nuala Alpha A, PA-C  sildenafil (VIAGRA) 50 MG tablet TAKE 1 TABLET BY MOUTH A HALF HOUR PRIOR TO SEXUAL INTERCOURSE AS NEEDED. *LIMIT TO 1 TABLET PER 24 HOURS* Patient taking differently: Take 50 mg by mouth as needed for erectile dysfunction. TAKE 1 TABLET BY MOUTH A HALF HOUR PRIOR TO SEXUAL INTERCOURSE AS NEEDED. *LIMIT TO 1 TABLET PER 24 HOURS* 06/03/19   Ladell Pier, MD  triamcinolone cream (KENALOG) 0.1 % Apply 1 application topically 2 (two) times daily. Patient not taking: Reported on 06/23/2019 03/03/19   Argentina Donovan, PA-C    Family History Family History  Problem Relation Age of Onset   Thyroid disease Mother    Diabetes Maternal Aunt    Diabetes Maternal Grandmother    Diabetes Maternal Grandfather    Thyroid disease Other     Social History Social History   Tobacco Use   Smoking status: Current Every Day Smoker    Packs/day:  0.25    Years: 6.00    Pack years: 1.50    Types: Cigarettes   Smokeless tobacco: Never Used  Substance Use Topics   Alcohol use: Yes    Comment: social    Drug use: Not Currently    Types: Marijuana    Comment: last used 4 days ago.     Allergies   Shellfish allergy   Review of Systems Review of Systems  Gastrointestinal: Positive for abdominal pain.  All other systems reviewed and are negative.    Physical Exam Updated Vital Signs BP (!) 155/102 (BP Location: Left Arm)    Pulse 70    Temp 97.7 F (36.5 C)    Resp 16    Ht 5' 11"  (1.803 m)    Wt 99.8 kg  SpO2 100%    BMI 30.68 kg/m   Physical Exam Vitals signs and nursing note reviewed.  Constitutional:      General: He is not in acute distress.    Appearance: He is well-developed. He is not diaphoretic.  HENT:     Head: Normocephalic and atraumatic.  Neck:     Musculoskeletal: Normal range of motion and neck supple.  Cardiovascular:     Rate and Rhythm: Normal rate and regular rhythm.     Heart sounds: No murmur. No friction rub.  Pulmonary:     Effort: Pulmonary effort is normal. No respiratory distress.     Breath sounds: Normal breath sounds. No wheezing or rales.  Abdominal:     General: Bowel sounds are normal. There is no distension.     Palpations: Abdomen is soft.     Tenderness: There is generalized abdominal tenderness. There is no right CVA tenderness, left CVA tenderness, guarding or rebound.     Comments: There is generalized abdominal tenderness but most profound in the epigastric region.  Musculoskeletal: Normal range of motion.  Skin:    General: Skin is warm and dry.  Neurological:     Mental Status: He is alert and oriented to person, place, and time.     Coordination: Coordination normal.      ED Treatments / Results  Labs (all labs ordered are listed, but only abnormal results are displayed) Labs Reviewed  COMPREHENSIVE METABOLIC PANEL  LIPASE, BLOOD  CBC WITH  DIFFERENTIAL/PLATELET    EKG None  Radiology No results found.  Procedures Procedures (including critical care time)  Medications Ordered in ED Medications  sodium chloride 0.9 % bolus 1,000 mL (has no administration in time range)  ondansetron (ZOFRAN) injection 4 mg (has no administration in time range)  ketorolac (TORADOL) 30 MG/ML injection 30 mg (has no administration in time range)  HYDROmorphone (DILAUDID) injection 1 mg (has no administration in time range)     Initial Impression / Assessment and Plan / ED Course  I have reviewed the triage vital signs and the nursing notes.  Pertinent labs & imaging results that were available during my care of the patient were reviewed by me and considered in my medical decision making (see chart for details).  Patient presenting with complaints of abdominal pain and vomiting.  Patient has history of gastroparesis and has had multiple episodes and visits to the ER this week.  He was discharged earlier this morning, but returns with a recurrence of severe pain, nausea and vomiting, and inability to keep food or liquids down.  Patient's laboratory studies are basically unchanged and CT scan of the abdomen and pelvis is unremarkable.  Patient feeling better after IV fluids and medications here in the ER.  I have observed him for an additional period of time and he states that he feels well enough to try to go home again.  I discussed the possibility of admission, however the patient would prefer to leave.  He will be discharged with pain and nausea medicines and follow-up later this week with GI.  He has a scheduled appointment with the gastroenterologist this coming Thursday.  Final Clinical Impressions(s) / ED Diagnoses   Final diagnoses:  None    ED Discharge Orders    None       Veryl Speak, MD 07/01/19 [id number]

## 2019-07-01 NOTE — Discharge Instructions (Addendum)
You have been diagnosed today with nausea and vomiting.  At this time there does not appear to be the presence of an emergent medical condition, however there is always the potential for conditions to change. Please read and follow the below instructions.  Please return to the Emergency Department immediately for any new or worsening symptoms. Please be sure to follow up with your Primary Care Provider within one week regarding your visit today; please call their office to schedule an appointment even if you are feeling better for a follow-up visit. Please be sure to drink plenty of water and get plenty of rest to avoid dehydration.  Please avoid using marijuana as this may worsen your symptoms.  Be sure to take all of your home medications as prescribed by your primary care provider. Your potassium level was low today.  Please have your potassium level rechecked at your primary care doctor's office next week and take the potassium supplements prescribed to you today as prescribed.  Get help right away if: You have pain in your chest, neck, arm, or jaw. You feel very weak or you pass out (faint). You throw up again and again. You have throw up that is bright red or looks like black coffee grounds. You have bloody or black poop (stools) or poop that looks like tar. You have a very bad headache, a stiff neck, or both. You have very bad pain, cramping, or bloating in your belly (abdomen). You have trouble breathing. You are breathing very quickly. Your heart is beating very quickly. Your skin feels cold and clammy. You feel confused. You have signs of losing too much water in your body, such as: Dark pee, very little pee, or no pee. Cracked lips. Dry mouth. Sunken eyes. Sleepiness. Weakness. You have any new/concerning or worsening of symptoms  Please read the additional information packets attached to your discharge summary.  Do not take your medicine if  develop an itchy rash,  swelling in your mouth or lips, or difficulty breathing; call 911 and seek immediate emergency medical attention if this occurs.  Note: Portions of this text may have been transcribed using voice recognition software. Every effort was made to ensure accuracy; however, inadvertent computerized transcription errors may still be present.

## 2019-07-01 NOTE — ED Triage Notes (Signed)
Patient reports that he is having abdominal cramping and nausea. Patient states, "I was here this AM, but they keep sending me back home.

## 2019-07-02 ENCOUNTER — Encounter (HOSPITAL_COMMUNITY): Payer: Self-pay

## 2019-07-02 ENCOUNTER — Inpatient Hospital Stay (HOSPITAL_COMMUNITY)
Admission: EM | Admit: 2019-07-02 | Discharge: 2019-07-03 | DRG: 074 | Discharge status: Against Medical Advice | Payer: Medicaid Other | Attending: Internal Medicine | Admitting: Internal Medicine

## 2019-07-02 DIAGNOSIS — Z20828 Contact with and (suspected) exposure to other viral communicable diseases: Secondary | ICD-10-CM | POA: Diagnosis present

## 2019-07-02 DIAGNOSIS — Z833 Family history of diabetes mellitus: Secondary | ICD-10-CM

## 2019-07-02 DIAGNOSIS — E1042 Type 1 diabetes mellitus with diabetic polyneuropathy: Secondary | ICD-10-CM | POA: Diagnosis present

## 2019-07-02 DIAGNOSIS — K3184 Gastroparesis: Secondary | ICD-10-CM | POA: Diagnosis present

## 2019-07-02 DIAGNOSIS — Z79899 Other long term (current) drug therapy: Secondary | ICD-10-CM

## 2019-07-02 DIAGNOSIS — Z87892 Personal history of anaphylaxis: Secondary | ICD-10-CM

## 2019-07-02 DIAGNOSIS — R1084 Generalized abdominal pain: Secondary | ICD-10-CM

## 2019-07-02 DIAGNOSIS — E876 Hypokalemia: Secondary | ICD-10-CM | POA: Diagnosis present

## 2019-07-02 DIAGNOSIS — R112 Nausea with vomiting, unspecified: Secondary | ICD-10-CM | POA: Diagnosis not present

## 2019-07-02 DIAGNOSIS — F17210 Nicotine dependence, cigarettes, uncomplicated: Secondary | ICD-10-CM | POA: Diagnosis present

## 2019-07-02 DIAGNOSIS — Z794 Long term (current) use of insulin: Secondary | ICD-10-CM

## 2019-07-02 DIAGNOSIS — E1143 Type 2 diabetes mellitus with diabetic autonomic (poly)neuropathy: Principal | ICD-10-CM | POA: Diagnosis present

## 2019-07-02 DIAGNOSIS — IMO0002 Reserved for concepts with insufficient information to code with codable children: Secondary | ICD-10-CM | POA: Diagnosis present

## 2019-07-02 DIAGNOSIS — N529 Male erectile dysfunction, unspecified: Secondary | ICD-10-CM | POA: Diagnosis present

## 2019-07-02 DIAGNOSIS — Z91013 Allergy to seafood: Secondary | ICD-10-CM

## 2019-07-02 DIAGNOSIS — E1065 Type 1 diabetes mellitus with hyperglycemia: Secondary | ICD-10-CM | POA: Diagnosis present

## 2019-07-02 DIAGNOSIS — K219 Gastro-esophageal reflux disease without esophagitis: Secondary | ICD-10-CM | POA: Diagnosis present

## 2019-07-02 LAB — URINALYSIS, ROUTINE W REFLEX MICROSCOPIC
Bacteria, UA: NONE SEEN
Bilirubin Urine: NEGATIVE
Glucose, UA: 50 mg/dL — AB
Hgb urine dipstick: NEGATIVE
Ketones, ur: 80 mg/dL — AB
Nitrite: NEGATIVE
Protein, ur: NEGATIVE mg/dL
Specific Gravity, Urine: 1.024 (ref 1.005–1.030)
pH: 6 (ref 5.0–8.0)

## 2019-07-02 LAB — COMPREHENSIVE METABOLIC PANEL
ALT: 11 U/L (ref 0–44)
AST: 14 U/L — ABNORMAL LOW (ref 15–41)
Albumin: 4.2 g/dL (ref 3.5–5.0)
Alkaline Phosphatase: 81 U/L (ref 38–126)
Anion gap: 11 (ref 5–15)
BUN: 6 mg/dL (ref 6–20)
CO2: 23 mmol/L (ref 22–32)
Calcium: 8.9 mg/dL (ref 8.9–10.3)
Chloride: 103 mmol/L (ref 98–111)
Creatinine, Ser: 0.96 mg/dL (ref 0.61–1.24)
GFR calc Af Amer: >60 mL/min (ref >60)
GFR calc non Af Amer: >60 mL/min (ref >60)
Glucose, Bld: 198 mg/dL — ABNORMAL HIGH (ref 70–99)
Potassium: 3.3 mmol/L — ABNORMAL LOW (ref 3.5–5.1)
Sodium: 137 mmol/L (ref 135–145)
Total Bilirubin: 0.8 mg/dL (ref 0.3–1.2)
Total Protein: 7.4 g/dL (ref 6.5–8.1)

## 2019-07-02 LAB — CBC
HCT: 43.1 % (ref 39.0–52.0)
Hemoglobin: 14 g/dL (ref 13.0–17.0)
MCH: 26.5 pg (ref 26.0–34.0)
MCHC: 32.5 g/dL (ref 30.0–36.0)
MCV: 81.6 fL (ref 80.0–100.0)
Platelets: 335 10*3/uL (ref 150–400)
RBC: 5.28 MIL/uL (ref 4.22–5.81)
RDW: 13.3 % (ref 11.5–15.5)
WBC: 7.1 10*3/uL (ref 4.0–10.5)
nRBC: 0 % (ref 0.0–0.2)

## 2019-07-02 LAB — GLUCOSE, CAPILLARY: Glucose-Capillary: 165 mg/dL — ABNORMAL HIGH (ref 70–99)

## 2019-07-02 LAB — SARS CORONAVIRUS 2 (TAT 6-24 HRS): SARS Coronavirus 2: NEGATIVE

## 2019-07-02 LAB — LIPASE, BLOOD: Lipase: 19 U/L (ref 11–51)

## 2019-07-02 MED ORDER — HYDRALAZINE HCL 20 MG/ML IJ SOLN: 10.0000 mg | Freq: Four times a day (QID) | INTRAMUSCULAR | Status: DC | PRN

## 2019-07-02 MED ORDER — POTASSIUM CHLORIDE IN NACL 20-0.45 MEQ/L-% IV SOLN
INTRAVENOUS | Status: DC
  Administered 2019-07-02 – 2019-07-03 (×2): via INTRAVENOUS
  Filled 2019-07-02 (×2): qty 1000

## 2019-07-02 MED ORDER — METOCLOPRAMIDE HCL 5 MG/ML IJ SOLN
10.0000 mg | Freq: Once | INTRAMUSCULAR | Status: AC
  Administered 2019-07-02: 10 mg via INTRAVENOUS
  Filled 2019-07-02: qty 2

## 2019-07-02 MED ORDER — ACETAMINOPHEN 325 MG PO TABS
650.0000 mg | ORAL_TABLET | Freq: Four times a day (QID) | ORAL | Status: DC | PRN
  Administered 2019-07-02: 650 mg via ORAL
  Filled 2019-07-02: qty 2

## 2019-07-02 MED ORDER — PANTOPRAZOLE SODIUM 40 MG IV SOLR
40.0000 mg | Freq: Once | INTRAVENOUS | Status: AC
  Administered 2019-07-02: 40 mg via INTRAVENOUS
  Filled 2019-07-02: qty 40

## 2019-07-02 MED ORDER — DIPHENHYDRAMINE HCL 50 MG/ML IJ SOLN
25.0000 mg | Freq: Once | INTRAMUSCULAR | Status: AC
  Administered 2019-07-02: 25 mg via INTRAVENOUS
  Filled 2019-07-02: qty 1

## 2019-07-02 MED ORDER — HYOSCYAMINE SULFATE 0.125 MG PO TABS
0.1250 mg | ORAL_TABLET | Freq: Four times a day (QID) | ORAL | Status: DC | PRN
  Filled 2019-07-02: qty 1

## 2019-07-02 MED ORDER — ACETAMINOPHEN 650 MG RE SUPP: 650.0000 mg | Freq: Four times a day (QID) | RECTAL | Status: DC | PRN

## 2019-07-02 MED ORDER — ONDANSETRON HCL 4 MG PO TABS: 4.0000 mg | ORAL_TABLET | Freq: Four times a day (QID) | ORAL | Status: DC | PRN

## 2019-07-02 MED ORDER — HYDROMORPHONE HCL 1 MG/ML IJ SOLN
0.5000 mg | Freq: Once | INTRAMUSCULAR | Status: AC
  Administered 2019-07-02: 0.5 mg via INTRAVENOUS
  Filled 2019-07-02: qty 0.5

## 2019-07-02 MED ORDER — INSULIN ASPART 100 UNIT/ML ~~LOC~~ SOLN
0.0000 [IU] | SUBCUTANEOUS | Status: DC
  Administered 2019-07-02: 2 [IU] via SUBCUTANEOUS
  Administered 2019-07-03: 3 [IU] via SUBCUTANEOUS
  Administered 2019-07-03 (×2): 2 [IU] via SUBCUTANEOUS

## 2019-07-02 MED ORDER — POTASSIUM CHLORIDE 10 MEQ/100ML IV SOLN
10.0000 meq | INTRAVENOUS | Status: AC
  Administered 2019-07-02 (×3): 10 meq via INTRAVENOUS
  Filled 2019-07-02 (×3): qty 100

## 2019-07-02 MED ORDER — POLYETHYLENE GLYCOL 3350 17 G PO PACK
17.0000 g | PACK | Freq: Every day | ORAL | Status: DC
  Administered 2019-07-02 – 2019-07-03 (×2): 17 g via ORAL
  Filled 2019-07-02 (×2): qty 1

## 2019-07-02 MED ORDER — METOCLOPRAMIDE HCL 5 MG/ML IJ SOLN
10.0000 mg | Freq: Four times a day (QID) | INTRAMUSCULAR | Status: DC
  Administered 2019-07-02 – 2019-07-03 (×4): 10 mg via INTRAVENOUS
  Filled 2019-07-02 (×4): qty 2

## 2019-07-02 MED ORDER — SODIUM CHLORIDE 0.9 % IV BOLUS
1000.0000 mL | Freq: Once | INTRAVENOUS | Status: AC
  Administered 2019-07-02: 1000 mL via INTRAVENOUS

## 2019-07-02 MED ORDER — ONDANSETRON HCL 4 MG/2ML IJ SOLN: 4.0000 mg | Freq: Four times a day (QID) | INTRAMUSCULAR | Status: DC | PRN

## 2019-07-02 MED ORDER — INSULIN GLARGINE 100 UNIT/ML ~~LOC~~ SOLN
6.0000 [IU] | Freq: Every day | SUBCUTANEOUS | Status: DC
  Administered 2019-07-02: 6 [IU] via SUBCUTANEOUS
  Filled 2019-07-02 (×2): qty 0.06

## 2019-07-02 MED ORDER — SODIUM CHLORIDE 0.9% FLUSH
3.0000 mL | Freq: Once | INTRAVENOUS | Status: AC
  Administered 2019-07-02: 3 mL via INTRAVENOUS

## 2019-07-02 MED ORDER — KETOROLAC TROMETHAMINE 30 MG/ML IJ SOLN
30.0000 mg | Freq: Three times a day (TID) | INTRAMUSCULAR | Status: DC
  Administered 2019-07-02 – 2019-07-03 (×3): 30 mg via INTRAVENOUS
  Filled 2019-07-02 (×3): qty 1

## 2019-07-02 MED ORDER — HYDROMORPHONE HCL 1 MG/ML IJ SOLN
0.5000 mg | Freq: Once | INTRAMUSCULAR | Status: AC
  Administered 2019-07-02: 0.5 mg via INTRAVENOUS
  Filled 2019-07-02: qty 1

## 2019-07-02 MED ORDER — ENOXAPARIN SODIUM 40 MG/0.4ML ~~LOC~~ SOLN: 40.0000 mg | SUBCUTANEOUS | Status: DC

## 2019-07-02 NOTE — ED Provider Notes (Signed)
[facility] DEPT Provider Note   CSN: [id number] Arrival date & time: 07/02/19  0907     History   Chief Complaint Chief Complaint  Patient presents with   Abdominal Pain   Emesis    HPI Jonathan Porter is a 28 y.o. male.     HPI   28 year old male, with PMH of diabetes, gastroparesis, presents with nausea and vomiting.  Per chart review this is patient's sixth visit in 5 days for this complaint.  He had a CT yesterday which was unremarkable.  He states each time he goes home and feels better for several hours and then starts vomiting again.  He states he has been vomiting since 6 AM and he has had approximately 6 episodes of vomiting.  He has some associated lower abdominal cramping which she states is normal for him with his gastroparesis.  He denies any fevers, chills, hematemesis.  He notes one episode of loose stool yesterday.   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 Authorizing Provider  Accu-Chek FastClix Lancets MISC Use to check blood sugar up to 3 times daily. 06/03/19   Ladell Pier, MD  bismuth subsalicylate (PEPTO BISMOL) 262 MG/15ML suspension Take 30 mLs by mouth every 6  (six) hours as needed for indigestion or diarrhea or loose stools.    [provider]  Blood Glucose Monitoring Suppl (ACCU-CHEK GUIDE ME) w/Device KIT 1 kit by Does not apply route 3 (three) times daily. Use to check blood sugar up to 3 times daily. 06/03/19   Ladell Pier, MD  dicyclomine (BENTYL) 20 MG tablet Take 1 tablet (20 mg total) by mouth 2 (two) times daily. 04/11/19   Blanchie Dessert, MD  gabapentin (NEURONTIN) 100 MG capsule Take 1 capsule (100 mg total) by mouth 3 (three) times daily. Patient not taking: Reported on 09/28/2018 08/25/18   Gildardo Pounds, NP  glucose blood (ACCU-CHEK GUIDE) test strip 4x a day 06/03/19   Ladell Pier, MD  glucose blood (TRUE METRIX BLOOD GLUCOSE TEST) test strip 1 each by Other route 3 (three) times daily. Patient not taking: Reported on 03/03/2019 09/28/18   Ladell Pier, MD  HYDROcodone-acetaminophen [facility]) 5-325 MG tablet Take 1-2 tablets by mouth every 4 (four) hours as needed. 07/01/19   Veryl Speak, MD  hyoscyamine (LEVSIN) 0.125 MG tablet Take 1 tablet (0.125 mg total) by mouth every 4 (four) hours as needed for cramping. 06/29/19   Daleen Bo, MD  imiquimod Leroy Sea) 5 % cream Apply a thin layer 3 times per week (on alternate days) prior to bedtime; leave on skin for 6 to 10 hours, then remove with mild soap and water. Continue until there  is total clearance of the genital/perianal warts or for a maximum duration of therapy of 16 weeks 07/02/18   Ladell Pier, MD  insulin aspart (NOVOLOG) 100 UNIT/ML FlexPen Inject 15 Units into the skin 3 (three) times daily with meals. 09/28/18   Ladell Pier, MD  Insulin Glargine (LANTUS SOLOSTAR) 100 UNIT/ML Solostar Pen Inject 15 Units into the skin daily at 10 pm. 09/28/18   Ladell Pier, MD  Insulin Pen Needle (B-D ULTRAFINE III SHORT PEN) 31G X 8 MM MISC 1 application by Does not apply route 4 (four) times daily. 05/21/18   Ladell Pier, MD  metoCLOPramide  (REGLAN) 10 MG tablet Take 1 tablet (10 mg total) by mouth every 6 (six) hours. Patient not taking: Reported on 06/23/2019 04/11/19   Blanchie Dessert, MD  nicotine (NICODERM CQ - DOSED IN MG/24 HOURS) 14 mg/24hr patch Place 1 patch (14 mg total) onto the skin daily. Patient not taking: Reported on 01/21/2019 09/28/18   Ladell Pier, MD  nicotine (NICODERM CQ - DOSED IN MG/24 HR) 7 mg/24hr patch Place 1 patch (7 mg total) onto the skin daily. Patient not taking: Reported on 06/23/2019 06/03/19   Ladell Pier, MD  omeprazole (PRILOSEC) 20 MG capsule Take 1 capsule (20 mg total) by mouth daily. Patient not taking: Reported on 06/23/2019 04/11/19   Blanchie Dessert, MD  ondansetron (ZOFRAN ODT) 8 MG disintegrating tablet 54m ODT q4 hours prn nausea 07/01/19   DVeryl Speak MD  potassium chloride (KLOR-CON) 10 MEQ tablet Take 2 tablets (20 mEq total) by mouth daily for 3 days. 07/01/19 07/04/19  MNuala AlphaA, PA-C  sildenafil (VIAGRA) 50 MG tablet TAKE 1 TABLET BY MOUTH A HALF HOUR PRIOR TO SEXUAL INTERCOURSE AS NEEDED. *LIMIT TO 1 TABLET PER 24 HOURS* Patient taking differently: Take 50 mg by mouth as needed for erectile dysfunction. TAKE 1 TABLET BY MOUTH A HALF HOUR PRIOR TO SEXUAL INTERCOURSE AS NEEDED. *LIMIT TO 1 TABLET PER 24 HOURS* 06/03/19   JLadell Pier MD  triamcinolone cream (KENALOG) 0.1 % Apply 1 application topically 2 (two) times daily. Patient not taking: Reported on 07/01/2019 03/03/19   MArgentina Donovan PA-C    Family History Family History  Problem Relation Age of Onset   Thyroid disease Mother    Diabetes Maternal Aunt    Diabetes Maternal Grandmother    Diabetes Maternal Grandfather    Thyroid disease Other     Social History Social History   Tobacco Use   Smoking status: Current Every Day Smoker    Packs/day: 0.25    Years: 6.00    Pack years: 1.50    Types: Cigarettes   Smokeless tobacco: Never Used  Substance Use Topics   Alcohol use:  Yes    Comment: social    Drug use: Not Currently    Types: Marijuana    Comment: last used 4 days ago.     Allergies   Shellfish allergy   Review of Systems Review of Systems  Constitutional: Negative for chills and fever.  Respiratory: Negative for shortness of breath.   Cardiovascular: Negative for chest pain.  Gastrointestinal: Positive for abdominal pain, nausea and vomiting.     Physical Exam Updated Vital Signs BP (!) 153/108 (BP Location: Left Arm)    Pulse 68    Temp 98.3 F (36.8 C) (Oral)    Resp 16    Ht 6' (1.829 m)    Wt 99.8 kg  SpO2 100%    BMI 29.84 kg/m   Physical Exam Vitals signs and nursing note reviewed.  Constitutional:      Appearance: He is well-developed.  HENT:     Head: Normocephalic and atraumatic.  Eyes:     Conjunctiva/sclera: Conjunctivae normal.  Neck:     Musculoskeletal: Neck supple.  Cardiovascular:     Rate and Rhythm: Normal rate and regular rhythm.     Heart sounds: Normal heart sounds. No murmur.  Pulmonary:     Effort: Pulmonary effort is normal. No respiratory distress.     Breath sounds: Normal breath sounds. No wheezing or rales.  Abdominal:     General: Bowel sounds are normal. There is no distension.     Palpations: Abdomen is soft.     Tenderness: There is generalized abdominal tenderness.  Musculoskeletal: Normal range of motion.        General: No tenderness or deformity.  Skin:    General: Skin is warm and dry.     Findings: No erythema or rash.  Neurological:     Mental Status: He is alert and oriented to person, place, and time.  Psychiatric:        Behavior: Behavior normal.      ED Treatments / Results  Labs (all labs ordered are listed, but only abnormal results are displayed) Labs Reviewed  LIPASE, BLOOD  COMPREHENSIVE METABOLIC PANEL  CBC  URINALYSIS, ROUTINE W REFLEX MICROSCOPIC    EKG None  Radiology Ct Abdomen Pelvis W Contrast  Result Date: 07/01/2019 CLINICAL DATA:  Abdominal  cramping and nausea. EXAM: CT ABDOMEN AND PELVIS WITH CONTRAST TECHNIQUE: Multidetector CT imaging of the abdomen and pelvis was performed using the standard protocol following bolus administration of intravenous contrast. CONTRAST:  150m OMNIPAQUE IOHEXOL 300 MG/ML  SOLN COMPARISON:  CT scan 04/10/2006 FINDINGS: Lower chest: The lung bases are clear of acute process. No pleural effusion or pulmonary lesions. 6 mm nodule in the right middle lobe is stable. The heart is normal in size. No pericardial effusion. The distal esophagus and aorta are unremarkable. Hepatobiliary: No focal hepatic lesions or intrahepatic biliary dilatation. The gallbladder appears normal. No common bile duct dilatation. Pancreas: No mass, inflammation or ductal dilatation. Spleen: Normal size.  No focal lesions. Adrenals/Urinary Tract: The adrenal glands and kidneys are unremarkable. No renal, ureteral or bladder calculi or mass. No findings suspicious for pyelonephritis. Stomach/Bowel: The stomach, duodenum, small bowel and colon are grossly normal without oral contrast. No inflammatory changes, mass lesions or obstructive findings. The terminal ileum and appendix are normal. Vascular/Lymphatic: The aorta is normal in caliber. No dissection. The branch vessels are patent. The major venous structures are patent. No mesenteric or retroperitoneal mass or adenopathy. Small scattered lymph nodes are noted. Reproductive: The prostate gland and seminal vesicles are unremarkable. Other: No pelvic mass or adenopathy. No free pelvic fluid collections. No inguinal mass or adenopathy. No abdominal wall hernia or subcutaneous lesions. Musculoskeletal: No significant bony findings. Stable mild degenerate disc disease at L5-S1. IMPRESSION: 1. No acute abdominal/pelvic findings, mass lesions or adenopathy. 2. Stable 6 mm nodule in the right middle lobe, likely a benign lymph node. Electronically Signed   By: PMarijo SanesM.D.   On: 07/01/2019 17:21     Procedures Procedures (including critical care time)  Medications Ordered in ED Medications  sodium chloride flush (NS) 0.9 % injection 3 mL (has no administration in time range)  metoCLOPramide (REGLAN) injection 10 mg (has no administration in time  range)  diphenhydrAMINE (BENADRYL) injection 25 mg (has no administration in time range)     Initial Impression / Assessment and Plan / ED Course  I have reviewed the triage vital signs and the nursing notes.  Pertinent labs & imaging results that were available during my care of the patient were reviewed by me and considered in my medical decision making (see chart for details).        Patient presents with nausea and vomiting.  He has a history of gastroparesis.  This is his sixth visit in the last 5 days and his third visit in the last 24 hours.  He has received multiple rounds of antiemetics, fluids.  He states he goes home and feels better for several hours and then symptoms worsen.  Given his number of visits and concerns for intractable nausea and vomiting, I believe the patient needs admission.  He is feeling better after Reglan.  He was given a small dose of pain medicine.  Abdomen mildly tender palpation but he states this is normal with his gastroparesis.  He had a CT scan yesterday which was negative for acute intra-abdominal abnormality.  Blood work stable.  Discussed with the hospitalist, Dr. Marlowe Sax who will admit the patient.  Placed in inpatient COVID test.  Jonathan Porter was evaluated in Emergency Department on 07/02/2019 for the symptoms described in the history of present illness. He was evaluated in the context of the global COVID-19 pandemic, which necessitated consideration that the patient might be at risk for infection with the SARS-CoV-2 virus that causes COVID-19. Institutional protocols and algorithms that pertain to the evaluation of patients at risk for COVID-19 are in a state of rapid change based on  information released by regulatory bodies including the CDC and federal and state organizations. These policies and algorithms were followed during the patient's care in the ED.  Final Clinical Impressions(s) / ED Diagnoses   Final diagnoses:  None    ED Discharge Orders    None       Etter Sjogren, PA-C 07/02/19 1707    Lacretia Leigh, MD 07/03/19 1410

## 2019-07-02 NOTE — ED Triage Notes (Signed)
Pt presents with c/o abdominal pain and vomiting since 6 am this morning. Pt has been seen several times over the last few days for same. Reports the pain is in his lower abdomen and that he has vomited twice this morning.

## 2019-07-02 NOTE — Progress Notes (Signed)
Patient was transferred to % E at 1525. RN got report at 1500. Alert and oriented x 4. Pain complained. Room is set up. Call light was within patient's reach.

## 2019-07-02 NOTE — H&P (Signed)
[facility] History and Physical   Patient: Jonathan Porter GYF:[id number]   PCP: Ladell Pier, MD DOB: 03-09-1991   DOA: 07/02/2019   DOS: 07/02/2019   DOS: the patient was seen and examined on 07/02/2019  Patient coming from: The patient is coming from Home  Chief Complaint: Nausea and vomiting and abdominal pain  HPI: Jonathan Porter is a 28 y.o. male with Past medical history of type 2 diabetes mellitus, chronic gastroparesis, former marijuana use. Presents with complaints of abdominal pain and nausea and vomiting ongoing for last several days.  Patient has frequent gastroparesis flareup and seen in the ER in July August and September. In October patient has been in the ER on 06/23/2019, 06/25/2019, 06/26/2019, 06/28/2019, 06/29/2019, 06/30/2019 and today 07/02/2019. Patient gets some IV hydration and get some IV medication with improvement he was able to tolerate about diet and will go home and I will have recurrence of the symptoms. At the time of my evaluation reports that he continues to have some nausea as well as epigastric abdominal pain. Passing gas but no significant bowel movement. No fever no chills.  No chest pain or shortness of breath.  Denies any alcohol abuse. Used to smoke in the past marijuana but currently not smoking because he is aware that this can lead to cyclical vomiting syndrome. Smokes 1 pack that will last him for 3 days. Reports occasional acid reflux no other drug abuse.  ED Course: With multiple ER visit patient was referred for admission for gastroparesis flareup.  At his baseline ambulates without assistance independent for most of his ADL;  manages his medication on his own.  Review of Systems: as mentioned in the history of present illness.  All other systems reviewed and are negative.  Past Medical History:  Diagnosis Date  . Diabetes mellitus without complication [facility])    Past Surgical History:  Procedure Laterality Date  . NO PAST  SURGERIES     Social History:  reports that he has been smoking cigarettes. He has a 1.50 pack-year smoking history. He has never used smokeless tobacco. He reports current alcohol use. He reports previous drug use. Drug: Marijuana.  Allergies  Allergen Reactions  . Shellfish Allergy Anaphylaxis    Family history reviewed and not pertinent Family History  Problem Relation Age of Onset  . Thyroid disease Mother   . Diabetes Maternal Aunt   . Diabetes Maternal Grandmother   . Diabetes Maternal Grandfather   . Thyroid disease Other      Prior to Admission medications   Medication Sig Start Date End Date Taking? Authorizing Provider  bismuth subsalicylate (PEPTO BISMOL) 262 MG/15ML suspension Take 30 mLs by mouth every 6 (six) hours as needed for indigestion or diarrhea or loose stools.   Yes [provider]  HYDROcodone-acetaminophen (NORCO) 5-325 MG tablet Take 1-2 tablets by mouth every 4 (four) hours as needed. 07/01/19  Yes Delo, Nathaneil Canary, MD  hyoscyamine (LEVSIN) 0.125 MG tablet Take 1 tablet (0.125 mg total) by mouth every 4 (four) hours as needed for cramping. 06/29/19  Yes Daleen Bo, MD  insulin aspart (NOVOLOG) 100 UNIT/ML FlexPen Inject 15 Units into the skin 3 (three) times daily with meals. 09/28/18  Yes Ladell Pier, MD  Insulin Glargine (LANTUS SOLOSTAR) 100 UNIT/ML Solostar Pen Inject 15 Units into the skin daily at 10 pm. 09/28/18  Yes Ladell Pier, MD  ondansetron (ZOFRAN ODT) 8 MG disintegrating tablet 11m ODT q4 hours prn nausea 07/01/19  Yes  Veryl Speak, MD  potassium chloride (KLOR-CON) 10 MEQ tablet Take 2 tablets (20 mEq total) by mouth daily for 3 days. 07/01/19 07/04/19 Yes Nuala Alpha A, PA-C  sildenafil (VIAGRA) 50 MG tablet TAKE 1 TABLET BY MOUTH A HALF HOUR PRIOR TO SEXUAL INTERCOURSE AS NEEDED. *LIMIT TO 1 TABLET PER 24 HOURS* Patient taking differently: Take 50 mg by mouth as needed for erectile dysfunction. TAKE 1 TABLET BY MOUTH A  HALF HOUR PRIOR TO SEXUAL INTERCOURSE AS NEEDED. *LIMIT TO 1 TABLET PER 24 HOURS* 06/03/19  Yes Ladell Pier, MD  Accu-Chek FastClix Lancets MISC Use to check blood sugar up to 3 times daily. 06/03/19   Ladell Pier, MD  Blood Glucose Monitoring Suppl (ACCU-CHEK GUIDE ME) w/Device KIT 1 kit by Does not apply route 3 (three) times daily. Use to check blood sugar up to 3 times daily. 06/03/19   Ladell Pier, MD  glucose blood (ACCU-CHEK GUIDE) test strip 4x a day 06/03/19   Ladell Pier, MD  glucose blood (TRUE METRIX BLOOD GLUCOSE TEST) test strip 1 each by Other route 3 (three) times daily. Patient not taking: Reported on 03/03/2019 09/28/18   Ladell Pier, MD  Insulin Pen Needle (B-D ULTRAFINE III SHORT PEN) 31G X 8 MM MISC 1 application by Does not apply route 4 (four) times daily. 05/21/18   Ladell Pier, MD    Physical Exam: Vitals:   07/02/19 1440 07/02/19 1445 07/02/19 1531 07/02/19 1533  BP:   (!) 143/95   Pulse: 66 (!) 53 (!) 58 62  Resp:   20   Temp:   98.4 F (36.9 C)   TempSrc:      SpO2: 100% 100% 100%   Weight:      Height:        General: alert and oriented to time, place, and person. Appear in mild distress, affect appropriate Eyes: PERRL, Conjunctiva normal ENT: Oral Mucosa Clear, moist  Neck: no JVD, no Abnormal Mass Or lumps Cardiovascular: S1 and S2 Present, no Murmur, peripheral pulses symmetrical Respiratory: good respiratory effort, Bilateral Air entry equal and Decreased, no signs of accessory muscle use, Clear to Auscultation, no Crackles, no wheezes Abdomen: Bowel Sound present, Soft and mild tenderness, no hernia Skin: no rashes  Extremities: no Pedal edema, no calf tenderness Neurologic: without any new focal findings Gait not checked due to patient safety concerns  Data Reviewed: I have personally reviewed and interpreted labs, imaging as discussed below.  CBC: Recent Labs  Lab 06/29/19 1525 06/30/19 1454 07/01/19 0600  07/01/19 1555 07/02/19 1122  WBC 10.4 9.1 9.1 8.7 7.1  NEUTROABS 7.8*  --   --  5.6  --   HGB 15.1 14.7 15.1 15.6 14.0  HCT 45.8 44.7 45.7 46.4 43.1  MCV 80.8 80.4 80.7 80.3 81.6  PLT 341 357 376 357 098   Basic Metabolic Panel: Recent Labs  Lab 06/29/19 1525 06/30/19 1454 07/01/19 0600 07/01/19 1555 07/02/19 1122  NA 139 137 141 135 137  K 3.2* 3.3* 2.8* 3.1* 3.3*  CL 102 102 101 100 103  CO2 23 23 27 23 23   GLUCOSE 143* 159* 55* 132* 198*  BUN 10 9 10 8 6   CREATININE 1.04 0.99 1.01 0.91 0.96  CALCIUM 9.5 9.3 9.5 9.6 8.9   GFR: Estimated Creatinine Clearance: 137.7 mL/min (by C-G formula based on SCr of 0.96 mg/dL). Liver Function Tests: Recent Labs  Lab 06/29/19 1525 06/30/19 1454 07/01/19 0600 07/01/19 1555 07/02/19  1122  AST 14* 12* 14* 17 14*  ALT 15 12 13 13 11   ALKPHOS 98 98 91 97 81  BILITOT 0.8 1.0 0.8 1.2 0.8  PROT 8.3* 7.7 7.8 8.6* 7.4  ALBUMIN 4.8 4.5 4.6 5.1* 4.2   Recent Labs  Lab 06/28/19 0643 06/30/19 1454 07/01/19 0600 07/01/19 1555 07/02/19 1122  LIPASE 18 15 18 15 19    No results for input(s): AMMONIA in the last 168 hours. Coagulation Profile: No results for input(s): INR, PROTIME in the last 168 hours. Cardiac Enzymes: No results for input(s): CKTOTAL, CKMB, CKMBINDEX, TROPONINI in the last 168 hours. BNP (last 3 results) No results for input(s): PROBNP in the last 8760 hours. HbA1C: No results for input(s): HGBA1C in the last 72 hours. CBG: Recent Labs  Lab 07/01/19 0554 07/01/19 0738 07/01/19 1009  GLUCAP 57* 62* 127*   Lipid Profile: No results for input(s): CHOL, HDL, LDLCALC, TRIG, CHOLHDL, LDLDIRECT in the last 72 hours. Thyroid Function Tests: No results for input(s): TSH, T4TOTAL, FREET4, T3FREE, THYROIDAB in the last 72 hours. Anemia Panel: No results for input(s): VITAMINB12, FOLATE, FERRITIN, TIBC, IRON, RETICCTPCT in the last 72 hours. Urine analysis:    Component Value Date/Time   COLORURINE YELLOW  07/02/2019 1245   APPEARANCEUR CLEAR 07/02/2019 1245   LABSPEC 1.024 07/02/2019 1245   PHURINE 6.0 07/02/2019 1245   GLUCOSEU 50 (A) 07/02/2019 1245   HGBUR NEGATIVE 07/02/2019 1245   BILIRUBINUR NEGATIVE 07/02/2019 1245   BILIRUBINUR negative 07/02/2018 1552   BILIRUBINUR negative 04/18/2016 1107   KETONESUR 80 (A) 07/02/2019 1245   PROTEINUR NEGATIVE 07/02/2019 1245   UROBILINOGEN 0.2 07/02/2018 1552   UROBILINOGEN 0.2 11/22/2014 0214   NITRITE NEGATIVE 07/02/2019 1245   LEUKOCYTESUR TRACE (A) 07/02/2019 1245    Radiological Exams on Admission: Ct Abdomen Pelvis W Contrast  Result Date: 07/01/2019 CLINICAL DATA:  Abdominal cramping and nausea. EXAM: CT ABDOMEN AND PELVIS WITH CONTRAST TECHNIQUE: Multidetector CT imaging of the abdomen and pelvis was performed using the standard protocol following bolus administration of intravenous contrast. CONTRAST:  110m OMNIPAQUE IOHEXOL 300 MG/ML  SOLN COMPARISON:  CT scan 04/10/2006 FINDINGS: Lower chest: The lung bases are clear of acute process. No pleural effusion or pulmonary lesions. 6 mm nodule in the right middle lobe is stable. The heart is normal in size. No pericardial effusion. The distal esophagus and aorta are unremarkable. Hepatobiliary: No focal hepatic lesions or intrahepatic biliary dilatation. The gallbladder appears normal. No common bile duct dilatation. Pancreas: No mass, inflammation or ductal dilatation. Spleen: Normal size.  No focal lesions. Adrenals/Urinary Tract: The adrenal glands and kidneys are unremarkable. No renal, ureteral or bladder calculi or mass. No findings suspicious for pyelonephritis. Stomach/Bowel: The stomach, duodenum, small bowel and colon are grossly normal without oral contrast. No inflammatory changes, mass lesions or obstructive findings. The terminal ileum and appendix are normal. Vascular/Lymphatic: The aorta is normal in caliber. No dissection. The branch vessels are patent. The major venous structures  are patent. No mesenteric or retroperitoneal mass or adenopathy. Small scattered lymph nodes are noted. Reproductive: The prostate gland and seminal vesicles are unremarkable. Other: No pelvic mass or adenopathy. No free pelvic fluid collections. No inguinal mass or adenopathy. No abdominal wall hernia or subcutaneous lesions. Musculoskeletal: No significant bony findings. Stable mild degenerate disc disease at L5-S1. IMPRESSION: 1. No acute abdominal/pelvic findings, mass lesions or adenopathy. 2. Stable 6 mm nodule in the right middle lobe, likely a benign lymph node. Electronically Signed  By: Marijo Sanes M.D.   On: 07/01/2019 17:21    I reviewed all nursing notes, pharmacy notes, vitals, pertinent old records.  Assessment/Plan 1.  Gastroparesis flareup. Will provide with IV fluid prevention IV Reglan. IV Toradol for pain control prevention IV Pepcid for acid reflux control. Maintain a clear liquid diet for now. Currently no evidence of significant dehydration. Levsin for abdominal cramps. Avoiding narcotics for pain control.  2.  Type 2 diabetes mellitus, uncontrolled with hyperglycemia with gastroparesis Check hemoglobin A1c. Reducing home Lantus from 15 units to 6 units.  Insulin sensitive every 4 hours sliding scale. Monitor.  3.  Mild hypokalemia. Replacing orally.  4.  Concern for constipation. PRN stool softener.  Nutrition: Clear liquid diet DVT Prophylaxis: Subcutaneous Lovenox  Advance goals of care discussion: Full code   Consults: none   Family Communication: no family was present at bedside, at the time of interview.   Disposition: Admitted as observation, med-surge unit. Likely to be discharged home, in 1-2 days.  I have discussed plan of care as described above with RN and patient/family.  Author: Berle Mull, MD [facility] 07/02/2019 7:02 PM   To reach On-call, see care teams to locate the attending and reach out to them via [email or web address]. If  7PM-7AM, please contact night-coverage If you still have difficulty reaching the attending provider, please page the [facility] (Director on Call) for [facility] on amion for assistance.

## 2019-07-03 DIAGNOSIS — K3184 Gastroparesis: Secondary | ICD-10-CM | POA: Diagnosis present

## 2019-07-03 DIAGNOSIS — K219 Gastro-esophageal reflux disease without esophagitis: Secondary | ICD-10-CM | POA: Diagnosis present

## 2019-07-03 DIAGNOSIS — Z794 Long term (current) use of insulin: Secondary | ICD-10-CM | POA: Diagnosis not present

## 2019-07-03 DIAGNOSIS — F17210 Nicotine dependence, cigarettes, uncomplicated: Secondary | ICD-10-CM | POA: Diagnosis present

## 2019-07-03 DIAGNOSIS — Z91013 Allergy to seafood: Secondary | ICD-10-CM | POA: Diagnosis not present

## 2019-07-03 DIAGNOSIS — E1143 Type 2 diabetes mellitus with diabetic autonomic (poly)neuropathy: Secondary | ICD-10-CM | POA: Diagnosis present

## 2019-07-03 DIAGNOSIS — R112 Nausea with vomiting, unspecified: Secondary | ICD-10-CM | POA: Diagnosis present

## 2019-07-03 DIAGNOSIS — Z87892 Personal history of anaphylaxis: Secondary | ICD-10-CM | POA: Diagnosis not present

## 2019-07-03 DIAGNOSIS — Z833 Family history of diabetes mellitus: Secondary | ICD-10-CM | POA: Diagnosis not present

## 2019-07-03 DIAGNOSIS — E876 Hypokalemia: Secondary | ICD-10-CM | POA: Diagnosis present

## 2019-07-03 DIAGNOSIS — N529 Male erectile dysfunction, unspecified: Secondary | ICD-10-CM | POA: Diagnosis present

## 2019-07-03 DIAGNOSIS — Z79899 Other long term (current) drug therapy: Secondary | ICD-10-CM | POA: Diagnosis not present

## 2019-07-03 DIAGNOSIS — Z20828 Contact with and (suspected) exposure to other viral communicable diseases: Secondary | ICD-10-CM | POA: Diagnosis present

## 2019-07-03 LAB — CBC
HCT: 40.9 % (ref 39.0–52.0)
Hemoglobin: 13.4 g/dL (ref 13.0–17.0)
MCH: 26.3 pg (ref 26.0–34.0)
MCHC: 32.8 g/dL (ref 30.0–36.0)
MCV: 80.4 fL (ref 80.0–100.0)
Platelets: 301 10*3/uL (ref 150–400)
RBC: 5.09 MIL/uL (ref 4.22–5.81)
RDW: 13.1 % (ref 11.5–15.5)
WBC: 7.2 10*3/uL (ref 4.0–10.5)
nRBC: 0 % (ref 0.0–0.2)

## 2019-07-03 LAB — COMPREHENSIVE METABOLIC PANEL
ALT: 12 U/L (ref 0–44)
AST: 11 U/L — ABNORMAL LOW (ref 15–41)
Albumin: 3.9 g/dL (ref 3.5–5.0)
Alkaline Phosphatase: 75 U/L (ref 38–126)
Anion gap: 9 (ref 5–15)
BUN: 5 mg/dL — ABNORMAL LOW (ref 6–20)
CO2: 25 mmol/L (ref 22–32)
Calcium: 9 mg/dL (ref 8.9–10.3)
Chloride: 103 mmol/L (ref 98–111)
Creatinine, Ser: 0.8 mg/dL (ref 0.61–1.24)
GFR calc Af Amer: >60 mL/min (ref >60)
GFR calc non Af Amer: >60 mL/min (ref >60)
Glucose, Bld: 136 mg/dL — ABNORMAL HIGH (ref 70–99)
Potassium: 3.5 mmol/L (ref 3.5–5.1)
Sodium: 137 mmol/L (ref 135–145)
Total Bilirubin: 1.2 mg/dL (ref 0.3–1.2)
Total Protein: 6.8 g/dL (ref 6.5–8.1)

## 2019-07-03 LAB — HEMOGLOBIN A1C
Hgb A1c MFr Bld: 8.6 % — ABNORMAL HIGH (ref 4.8–5.6)
Mean Plasma Glucose: 200.12 mg/dL

## 2019-07-03 LAB — GLUCOSE, CAPILLARY
Glucose-Capillary: 124 mg/dL — ABNORMAL HIGH (ref 70–99)
Glucose-Capillary: 154 mg/dL — ABNORMAL HIGH (ref 70–99)
Glucose-Capillary: 188 mg/dL — ABNORMAL HIGH (ref 70–99)
Glucose-Capillary: 203 mg/dL — ABNORMAL HIGH (ref 70–99)
Glucose-Capillary: 66 mg/dL — ABNORMAL LOW (ref 70–99)

## 2019-07-03 LAB — HIV ANTIBODY (ROUTINE TESTING W REFLEX): HIV Screen 4th Generation wRfx: NONREACTIVE

## 2019-07-03 LAB — MAGNESIUM: Magnesium: 2.1 mg/dL (ref 1.7–2.4)

## 2019-07-03 NOTE — Progress Notes (Signed)
PROGRESS NOTE    Jonathan Porter  VHQ:[id number] DOB: 1991-02-19 DOA: 07/02/2019 PCP: Ladell Pier, MD   Brief Narrative:  28 year old with diabetes mellitus type 2, chronic gastroparesis, former marijuana use came to the hospital complains of abdominal pain, nausea.  Has had multiple ER visits due to this.  Admitted for gastroparesis flare   Assessment & Plan:   Principal Problem:   Gastroparesis Active Problems:   Diabetes type 1, uncontrolled (HCC)   Type 1 diabetes mellitus with diabetic polyneuropathy (HCC)   Erectile dysfunction  Nausea and vomiting secondary to gastroparesis flare, acute -Conservative management.  Still having nausea, continue IV fluids.  Antiemetics. -Avoid narcotics. -Reglan every 6 hours  Diabetes mellitus type 2, uncontrolled with hyperglycemia and gastroparesis - Lantus 6 units. - A1c 8.6  DVT prophylaxis: Lovenox Code Status: Full code Family Communication: None Disposition Plan: Maintain hospital stay as patient is still having nausea, unable to fully tolerate oral diet.  Plan would be to slowly advance his diet in the meantime give IV fluids.  Consultants:   None  Procedures:   None  Antimicrobials:   None   Subjective: Still having some nausea but relatively better compared to yesterday.  Would like to try some more diet.  Somewhat tolerated clears this morning.  Review of Systems Otherwise negative except as per HPI, including: General: Denies fever, chills, night sweats or unintended weight loss. Resp: Denies cough, wheezing, shortness of breath. Cardiac: Denies chest pain, palpitations, orthopnea, paroxysmal nocturnal dyspnea. GI: Denies abdominal pain,vomiting, diarrhea or constipation GU: Denies dysuria, frequency, hesitancy or incontinence MS: Denies muscle aches, joint pain or swelling Neuro: Denies headache, neurologic deficits (focal weakness, numbness, tingling), abnormal gait Psych: Denies anxiety,  depression, SI/HI/AVH Skin: Denies new rashes or lesions ID: Denies sick contacts, exotic exposures, travel  Objective: Vitals:   07/02/19 1531 07/02/19 1533 07/02/19 2035 07/03/19 0433  BP: (!) 143/95  (!) 152/99 (!) 147/97  Pulse: (!) 58 62 63 68  Resp: 20  19 19   Temp: 98.4 F (36.9 C)  (!) 97.5 F (36.4 C) 98.5 F (36.9 C)  TempSrc:   Oral Oral  SpO2: 100%  100% 100%  Weight:    96.7 kg  Height:        Intake/Output Summary (Last 24 hours) at 07/03/2019 0927 Last data filed at 07/03/2019 4132 Gross per 24 hour  Intake 1697.7 ml  Output 1900 ml  Net -202.3 ml   Filed Weights   07/02/19 0919 07/03/19 0433  Weight: 99.8 kg 96.7 kg    Examination:  General exam: Appears calm and comfortable  Respiratory system: Clear to auscultation. Respiratory effort normal. Cardiovascular system: S1 & S2 heard, RRR. No JVD, murmurs, rubs, gallops or clicks. No pedal edema. Gastrointestinal system: Abdomen is nondistended, soft and nontender. No organomegaly or masses felt. Normal bowel sounds heard. Central nervous system: Alert and oriented. No focal neurological deficits. Extremities: Symmetric 5 x 5 power. Skin: No rashes, lesions or ulcers Psychiatry: Judgement and insight appear normal. Mood & affect appropriate.     Data Reviewed:   CBC: Recent Labs  Lab 06/29/19 1525 06/30/19 1454 07/01/19 0600 07/01/19 1555 07/02/19 1122 07/03/19 0552  WBC 10.4 9.1 9.1 8.7 7.1 7.2  NEUTROABS 7.8*  --   --  5.6  --   --   HGB 15.1 14.7 15.1 15.6 14.0 13.4  HCT 45.8 44.7 45.7 46.4 43.1 40.9  MCV 80.8 80.4 80.7 80.3 81.6 80.4  PLT 341 357 376 357  335 301   Basic Metabolic Panel: Recent Labs  Lab 06/30/19 1454 07/01/19 0600 07/01/19 1555 07/02/19 1122 07/03/19 0552  NA 137 141 135 137 137  K 3.3* 2.8* 3.1* 3.3* 3.5  CL 102 101 100 103 103  CO2 23 27 23 23 25   GLUCOSE 159* 55* 132* 198* 136*  BUN 9 10 8 6  5*  CREATININE 0.99 1.01 0.91 0.96 0.80  CALCIUM 9.3 9.5 9.6  8.9 9.0  MG  --   --   --   --  2.1   GFR: Estimated Creatinine Clearance: 162.7 mL/min (by C-G formula based on SCr of 0.8 mg/dL). Liver Function Tests: Recent Labs  Lab 06/30/19 1454 07/01/19 0600 07/01/19 1555 07/02/19 1122 07/03/19 0552  AST 12* 14* 17 14* 11*  ALT 12 13 13 11 12   ALKPHOS 98 91 97 81 75  BILITOT 1.0 0.8 1.2 0.8 1.2  PROT 7.7 7.8 8.6* 7.4 6.8  ALBUMIN 4.5 4.6 5.1* 4.2 3.9   Recent Labs  Lab 06/28/19 0643 06/30/19 1454 07/01/19 0600 07/01/19 1555 07/02/19 1122  LIPASE 18 15 18 15 19    No results for input(s): AMMONIA in the last 168 hours. Coagulation Profile: No results for input(s): INR, PROTIME in the last 168 hours. Cardiac Enzymes: No results for input(s): CKTOTAL, CKMB, CKMBINDEX, TROPONINI in the last 168 hours. BNP (last 3 results) No results for input(s): PROBNP in the last 8760 hours. HbA1C: Recent Labs    07/03/19 0552  HGBA1C 8.6*   CBG: Recent Labs  Lab 07/02/19 2038 07/03/19 0012 07/03/19 0436 07/03/19 0710 07/03/19 0905  GLUCAP 165* 188* 203* 66* 124*   Lipid Profile: No results for input(s): CHOL, HDL, LDLCALC, TRIG, CHOLHDL, LDLDIRECT in the last 72 hours. Thyroid Function Tests: No results for input(s): TSH, T4TOTAL, FREET4, T3FREE, THYROIDAB in the last 72 hours. Anemia Panel: No results for input(s): VITAMINB12, FOLATE, FERRITIN, TIBC, IRON, RETICCTPCT in the last 72 hours. Sepsis Labs: No results for input(s): PROCALCITON, LATICACIDVEN in the last 168 hours.  Recent Results (from the past 240 hour(s))  SARS CORONAVIRUS 2 (TAT 6-24 HRS) Nasopharyngeal Nasopharyngeal Swab     Status: None   Collection Time: 06/29/19  3:30 PM   Specimen: Nasopharyngeal Swab  Result Value Ref Range Status   SARS Coronavirus 2 NEGATIVE NEGATIVE Final    Comment: (NOTE) SARS-CoV-2 target nucleic acids are NOT DETECTED. The SARS-CoV-2 RNA is generally detectable in upper and lower respiratory specimens during the acute phase of  infection. Negative results do not preclude SARS-CoV-2 infection, do not rule out co-infections with other pathogens, and should not be used as the sole basis for treatment or other patient management decisions. Negative results must be combined with clinical observations, patient history, and epidemiological information. The expected result is Negative. Fact Sheet for Patients: HairSlick.nohttps://www.fda.gov/media/138098/download Fact Sheet for Healthcare Providers: quierodirigir.comhttps://www.fda.gov/media/138095/download This test is not yet approved or cleared by the [place] FDA and  has been authorized for detection and/or diagnosis of SARS-CoV-2 by FDA under an Emergency Use Authorization (EUA). This EUA will remain  in effect (meaning this test can be used) for the duration of the COVID-19 declaration under Section 56 4(b)(1) of the Act, 21 U.S.C. section 360bbb-3(b)(1), unless the authorization is terminated or revoked sooner. Performed at [facility], [address] [place], [place] [address]   SARS CORONAVIRUS 2 (TAT 6-24 HRS) Nasopharyngeal Nasopharyngeal Swab     Status: None   Collection Time: 07/02/19  2:17 PM  Specimen: Nasopharyngeal Swab  Result Value Ref Range Status   SARS Coronavirus 2 NEGATIVE NEGATIVE Final    Comment: (NOTE) SARS-CoV-2 target nucleic acids are NOT DETECTED. The SARS-CoV-2 RNA is generally detectable in upper and lower respiratory specimens during the acute phase of infection. Negative results do not preclude SARS-CoV-2 infection, do not rule out co-infections with other pathogens, and should not be used as the sole basis for treatment or other patient management decisions. Negative results must be combined with clinical observations, patient history, and epidemiological information. The expected result is Negative. Fact Sheet for Patients: HairSlick.no Fact Sheet for Healthcare  Providers: quierodirigir.com This test is not yet approved or cleared by the [place] FDA and  has been authorized for detection and/or diagnosis of SARS-CoV-2 by FDA under an Emergency Use Authorization (EUA). This EUA will remain  in effect (meaning this test can be used) for the duration of the COVID-19 declaration under Section 56 4(b)(1) of the Act, 21 U.S.C. section 360bbb-3(b)(1), unless the authorization is terminated or revoked sooner. Performed at [facility], [address], [place], [place] [address]          Radiology Studies: Ct Abdomen Pelvis W Contrast  Result Date: 07/01/2019 CLINICAL DATA:  Abdominal cramping and nausea. EXAM: CT ABDOMEN AND PELVIS WITH CONTRAST TECHNIQUE: Multidetector CT imaging of the abdomen and pelvis was performed using the standard protocol following bolus administration of intravenous contrast. CONTRAST:  OMNIPAQUE IOHEXOL 300 MG/ML  SOLN COMPARISON:  CT scan 04/10/2006 FINDINGS: Lower chest: The lung bases are clear of acute process. No pleural effusion or pulmonary lesions. 6 mm nodule in the right middle lobe is stable. The heart is normal in size. No pericardial effusion. The distal esophagus and aorta are unremarkable. Hepatobiliary: No focal hepatic lesions or intrahepatic biliary dilatation. The gallbladder appears normal. No common bile duct dilatation. Pancreas: No mass, inflammation or ductal dilatation. Spleen: Normal size.  No focal lesions. Adrenals/Urinary Tract: The adrenal glands and kidneys are unremarkable. No renal, ureteral or bladder calculi or mass. No findings suspicious for pyelonephritis. Stomach/Bowel: The stomach, duodenum, small bowel and colon are grossly normal without oral contrast. No inflammatory changes, mass lesions or obstructive findings. The terminal ileum and appendix are normal. Vascular/Lymphatic: The aorta is normal in caliber. No dissection. The branch vessels are  patent. The major venous structures are patent. No mesenteric or retroperitoneal mass or adenopathy. Small scattered lymph nodes are noted. Reproductive: The prostate gland and seminal vesicles are unremarkable. Other: No pelvic mass or adenopathy. No free pelvic fluid collections. No inguinal mass or adenopathy. No abdominal wall hernia or subcutaneous lesions. Musculoskeletal: No significant bony findings. Stable mild degenerate disc disease at L5-S1. IMPRESSION: 1. No acute abdominal/pelvic findings, mass lesions or adenopathy. 2. Stable 6 mm nodule in the right middle lobe, likely a benign lymph node. Electronically Signed   By: Rudie Meyer M.D.   On: 07/01/2019 17:21        Scheduled Meds:  enoxaparin (LOVENOX) injection  40 mg Subcutaneous Q24H   insulin aspart  0-9 Units Subcutaneous Q4H   insulin glargine  6 Units Subcutaneous Q2200   ketorolac  30 mg Intravenous Q8H   metoCLOPramide (REGLAN) injection  10 mg Intravenous Q6H   polyethylene glycol  17 g Oral Daily   Continuous Infusions:  0.45 % NaCl with KCl 20 mEq / L 75 mL/hr at 07/03/19 0600     LOS: 0 days   Time spent= 25 mins  Domitila Stetler Joline Maxcy, MD [facility]  If 7PM-7AM, please contact night-coverage [email or web address] 07/03/2019, 9:27 AM

## 2019-07-03 NOTE — Progress Notes (Signed)
Pt called RN to the room around 12:30, expressed that he needs to be discharged he has a family emergency and he needs to leave, does not want to leave AMA. RN notified pt that she will contact provider to get d/c order. RN messaged provider in epic, no response received. At 12:50 pt requested RN take his IV out, he has to leave now. Pt told RN he is a single father and has a family emergency with his son and has to leave now. Pt d/c from facility at 12:51.

## 2019-07-04 ENCOUNTER — Emergency Department (HOSPITAL_COMMUNITY)
Admission: EM | Admit: 2019-07-04 | Discharge: 2019-07-04 | Disposition: A | Discharge status: Home / Self Care | Payer: Medicaid Other | Attending: Emergency Medicine | Admitting: Emergency Medicine

## 2019-07-04 ENCOUNTER — Other Ambulatory Visit: Payer: Self-pay

## 2019-07-04 ENCOUNTER — Encounter (HOSPITAL_COMMUNITY): Payer: Self-pay

## 2019-07-04 DIAGNOSIS — F1210 Cannabis abuse, uncomplicated: Secondary | ICD-10-CM | POA: Diagnosis not present

## 2019-07-04 DIAGNOSIS — K3184 Gastroparesis: Secondary | ICD-10-CM

## 2019-07-04 DIAGNOSIS — Z79899 Other long term (current) drug therapy: Secondary | ICD-10-CM | POA: Diagnosis not present

## 2019-07-04 DIAGNOSIS — R109 Unspecified abdominal pain: Secondary | ICD-10-CM | POA: Diagnosis present

## 2019-07-04 DIAGNOSIS — Z794 Long term (current) use of insulin: Secondary | ICD-10-CM | POA: Diagnosis not present

## 2019-07-04 DIAGNOSIS — F17210 Nicotine dependence, cigarettes, uncomplicated: Secondary | ICD-10-CM | POA: Insufficient documentation

## 2019-07-04 DIAGNOSIS — E109 Type 1 diabetes mellitus without complications: Secondary | ICD-10-CM | POA: Diagnosis not present

## 2019-07-04 LAB — CBC WITH DIFFERENTIAL/PLATELET
Abs Immature Granulocytes: 0.02 10*3/uL (ref 0.00–0.07)
Basophils Absolute: 0.1 10*3/uL (ref 0.0–0.1)
Basophils Relative: 1 %
Eosinophils Absolute: 0.1 10*3/uL (ref 0.0–0.5)
Eosinophils Relative: 1 %
HCT: 45.9 % (ref 39.0–52.0)
Hemoglobin: 15.1 g/dL (ref 13.0–17.0)
Immature Granulocytes: 0 %
Lymphocytes Relative: 17 %
Lymphs Abs: 1.4 10*3/uL (ref 0.7–4.0)
MCH: 26.6 pg (ref 26.0–34.0)
MCHC: 32.9 g/dL (ref 30.0–36.0)
MCV: 81 fL (ref 80.0–100.0)
Monocytes Absolute: 0.4 10*3/uL (ref 0.1–1.0)
Monocytes Relative: 6 %
Neutro Abs: 6 10*3/uL (ref 1.7–7.7)
Neutrophils Relative %: 75 %
Platelets: 386 10*3/uL (ref 150–400)
RBC: 5.67 MIL/uL (ref 4.22–5.81)
RDW: 13.3 % (ref 11.5–15.5)
WBC: 8 10*3/uL (ref 4.0–10.5)
nRBC: 0 % (ref 0.0–0.2)

## 2019-07-04 LAB — BASIC METABOLIC PANEL
Anion gap: 14 (ref 5–15)
BUN: 9 mg/dL (ref 6–20)
CO2: 25 mmol/L (ref 22–32)
Calcium: 9.7 mg/dL (ref 8.9–10.3)
Chloride: 98 mmol/L (ref 98–111)
Creatinine, Ser: 1.04 mg/dL (ref 0.61–1.24)
GFR calc Af Amer: >60 mL/min (ref >60)
GFR calc non Af Amer: >60 mL/min (ref >60)
Glucose, Bld: 223 mg/dL — ABNORMAL HIGH (ref 70–99)
Potassium: 3.7 mmol/L (ref 3.5–5.1)
Sodium: 137 mmol/L (ref 135–145)

## 2019-07-04 LAB — URINALYSIS, ROUTINE W REFLEX MICROSCOPIC
Bilirubin Urine: NEGATIVE
Glucose, UA: 50 mg/dL — AB
Hgb urine dipstick: NEGATIVE
Ketones, ur: 80 mg/dL — AB
Leukocytes,Ua: NEGATIVE
Nitrite: NEGATIVE
Protein, ur: NEGATIVE mg/dL
Specific Gravity, Urine: 1.019 (ref 1.005–1.030)
pH: 6 (ref 5.0–8.0)

## 2019-07-04 LAB — RAPID URINE DRUG SCREEN, HOSP PERFORMED
Amphetamines: NOT DETECTED
Barbiturates: NOT DETECTED
Benzodiazepines: NOT DETECTED
Cocaine: NOT DETECTED
Opiates: NOT DETECTED
Tetrahydrocannabinol: POSITIVE — AB

## 2019-07-04 MED ORDER — HALOPERIDOL LACTATE 5 MG/ML IJ SOLN
5.0000 mg | Freq: Once | INTRAMUSCULAR | Status: AC
  Administered 2019-07-04: 5 mg via INTRAVENOUS
  Filled 2019-07-04: qty 1

## 2019-07-04 MED ORDER — KETOROLAC TROMETHAMINE 15 MG/ML IJ SOLN
15.0000 mg | Freq: Once | INTRAMUSCULAR | Status: AC
  Administered 2019-07-04: 15 mg via INTRAVENOUS
  Filled 2019-07-04: qty 1

## 2019-07-04 MED ORDER — METOCLOPRAMIDE HCL 5 MG/ML IJ SOLN
10.0000 mg | Freq: Once | INTRAMUSCULAR | Status: AC
  Administered 2019-07-04: 10 mg via INTRAVENOUS
  Filled 2019-07-04: qty 2

## 2019-07-04 MED ORDER — SUCRALFATE 1 G PO TABS
1.0000 g | ORAL_TABLET | Freq: Once | ORAL | Status: AC
  Administered 2019-07-04: 1 g via ORAL
  Filled 2019-07-04: qty 1

## 2019-07-04 MED ORDER — ONDANSETRON 4 MG PO TBDP
4.0000 mg | ORAL_TABLET | Freq: Once | ORAL | Status: AC | PRN
  Administered 2019-07-04: 4 mg via ORAL
  Filled 2019-07-04: qty 1

## 2019-07-04 MED ORDER — SODIUM CHLORIDE 0.9 % IV BOLUS
1000.0000 mL | Freq: Once | INTRAVENOUS | Status: AC
  Administered 2019-07-04: 1000 mL via INTRAVENOUS

## 2019-07-04 MED ORDER — FENTANYL CITRATE (PF) 100 MCG/2ML IJ SOLN
50.0000 ug | Freq: Once | INTRAMUSCULAR | Status: AC
  Administered 2019-07-04: 50 ug via INTRAVENOUS
  Filled 2019-07-04: qty 2

## 2019-07-04 NOTE — Discharge Summary (Signed)
Physician Discharge Summary  RUSHI CHASEN TLX:[id number] DOB: 1990/12/12 DOA: 07/02/2019  PCP: Ladell Pier, MD  Admit date: 07/02/2019 Discharge date: 07/04/2019  Admitted From: Home Disposition: Home  Recommendations for Outpatient Follow-up:  Left AGAINST MEDICAL ADVICE  Brief/Interim Summary: 28 year old with diabetes mellitus type 2, chronic gastroparesis, former marijuana use came to the hospital complains of abdominal pain, nausea.  Has had multiple ER visits due to this.  Admitted for gastroparesis flare.  Conservative management with IV fluids and antiemetics.  Following day patient was still nauseous but left AGAINST MEDICAL ADVICE due to some " family emergency".   Discharge Diagnoses:  Principal Problem:   Gastroparesis Active Problems:   Diabetes type 1, uncontrolled (HCC)   Type 1 diabetes mellitus with diabetic polyneuropathy (HCC)   Erectile dysfunction   Intractable nausea and vomiting    Discharge Exam: Vitals:   07/02/19 2035 07/03/19 0433  BP: (!) 152/99 (!) 147/97  Pulse: 63 68  Resp: 19 19  Temp: (!) 97.5 F (36.4 C) 98.5 F (36.9 C)  SpO2: 100% 100%   Vitals:   07/02/19 1531 07/02/19 1533 07/02/19 2035 07/03/19 0433  BP: (!) 143/95  (!) 152/99 (!) 147/97  Pulse: (!) 58 62 63 68  Resp: 20  19 19   Temp: 98.4 F (36.9 C)  (!) 97.5 F (36.4 C) 98.5 F (36.9 C)  TempSrc:   Oral Oral  SpO2: 100%  100% 100%  Weight:    96.7 kg  Height:          Discharge Instructions   Allergies as of 07/03/2019      Reactions   Shellfish Allergy Anaphylaxis      Medication List    ASK your doctor about these medications   Accu-Chek FastClix Lancets Misc Use to check blood sugar up to 3 times daily.   Accu-Chek Guide Me w/Device Kit 1 kit by Does not apply route 3 (three) times daily. Use to check blood sugar up to 3 times daily.   bismuth subsalicylate 741 UL/84TX suspension Commonly known as: PEPTO BISMOL Take 30 mLs by mouth every 6  (six) hours as needed for indigestion or diarrhea or loose stools.   glucose blood test strip Commonly known as: True Metrix Blood Glucose Test 1 each by Other route 3 (three) times daily.   Accu-Chek Guide test strip Generic drug: glucose blood 4x a day   HYDROcodone-acetaminophen 5-325 MG tablet Commonly known as: Norco Take 1-2 tablets by mouth every 4 (four) hours as needed.   hyoscyamine 0.125 MG tablet Commonly known as: LEVSIN Take 1 tablet (0.125 mg total) by mouth every 4 (four) hours as needed for cramping.   insulin aspart 100 UNIT/ML FlexPen Commonly known as: NOVOLOG Inject 15 Units into the skin 3 (three) times daily with meals.   Insulin Glargine 100 UNIT/ML Solostar Pen Commonly known as: Lantus SoloStar Inject 15 Units into the skin daily at 10 pm.   Insulin Pen Needle 31G X 8 MM Misc Commonly known as: B-D ULTRAFINE III SHORT PEN 1 application by Does not apply route 4 (four) times daily.   ondansetron 8 MG disintegrating tablet Commonly known as: Zofran ODT 43m ODT q4 hours prn nausea   potassium chloride 10 MEQ tablet Commonly known as: KLOR-CON Take 2 tablets (20 mEq total) by mouth daily for 3 days.   sildenafil 50 MG tablet Commonly known as: VIAGRA TAKE 1 TABLET BY MOUTH A HALF HOUR PRIOR TO SEXUAL INTERCOURSE AS NEEDED. *LIMIT TO  1 TABLET PER 24 HOURS*       Allergies  Allergen Reactions  . Shellfish Allergy Anaphylaxis    You were cared for by a hospitalist during your hospital stay. If you have any questions about your discharge medications or the care you received while you were in the hospital after you are discharged, you can call the unit and asked to speak with the hospitalist on call if the hospitalist that took care of you is not available. Once you are discharged, your primary care physician will handle any further medical issues. Please note that no refills for any discharge medications will be authorized once you are discharged,  as it is imperative that you return to your primary care physician (or establish a relationship with a primary care physician if you do not have one) for your aftercare needs so that they can reassess your need for medications and monitor your lab values.   Procedures/Studies: Ct Abdomen Pelvis W Contrast  Result Date: 07/01/2019 CLINICAL DATA:  Abdominal cramping and nausea. EXAM: CT ABDOMEN AND PELVIS WITH CONTRAST TECHNIQUE: Multidetector CT imaging of the abdomen and pelvis was performed using the standard protocol following bolus administration of intravenous contrast. CONTRAST:  158m OMNIPAQUE IOHEXOL 300 MG/ML  SOLN COMPARISON:  CT scan 04/10/2006 FINDINGS: Lower chest: The lung bases are clear of acute process. No pleural effusion or pulmonary lesions. 6 mm nodule in the right middle lobe is stable. The heart is normal in size. No pericardial effusion. The distal esophagus and aorta are unremarkable. Hepatobiliary: No focal hepatic lesions or intrahepatic biliary dilatation. The gallbladder appears normal. No common bile duct dilatation. Pancreas: No mass, inflammation or ductal dilatation. Spleen: Normal size.  No focal lesions. Adrenals/Urinary Tract: The adrenal glands and kidneys are unremarkable. No renal, ureteral or bladder calculi or mass. No findings suspicious for pyelonephritis. Stomach/Bowel: The stomach, duodenum, small bowel and colon are grossly normal without oral contrast. No inflammatory changes, mass lesions or obstructive findings. The terminal ileum and appendix are normal. Vascular/Lymphatic: The aorta is normal in caliber. No dissection. The branch vessels are patent. The major venous structures are patent. No mesenteric or retroperitoneal mass or adenopathy. Small scattered lymph nodes are noted. Reproductive: The prostate gland and seminal vesicles are unremarkable. Other: No pelvic mass or adenopathy. No free pelvic fluid collections. No inguinal mass or adenopathy. No  abdominal wall hernia or subcutaneous lesions. Musculoskeletal: No significant bony findings. Stable mild degenerate disc disease at L5-S1. IMPRESSION: 1. No acute abdominal/pelvic findings, mass lesions or adenopathy. 2. Stable 6 mm nodule in the right middle lobe, likely a benign lymph node. Electronically Signed   By: PMarijo SanesM.D.   On: 07/01/2019 17:21      The results of significant diagnostics from this hospitalization (including imaging, microbiology, ancillary and laboratory) are listed below for reference.     Microbiology: Recent Results (from the past 240 hour(s))  SARS CORONAVIRUS 2 (TAT 6-24 HRS) Nasopharyngeal Nasopharyngeal Swab     Status: None   Collection Time: 06/29/19  3:30 PM   Specimen: Nasopharyngeal Swab  Result Value Ref Range Status   SARS Coronavirus 2 NEGATIVE NEGATIVE Final    Comment: (NOTE) SARS-CoV-2 target nucleic acids are NOT DETECTED. The SARS-CoV-2 RNA is generally detectable in upper and lower respiratory specimens during the acute phase of infection. Negative results do not preclude SARS-CoV-2 infection, do not rule out co-infections with other pathogens, and should not be used as the sole basis for treatment or other  patient management decisions. Negative results must be combined with clinical observations, patient history, and epidemiological information. The expected result is Negative. Fact Sheet for Patients: SugarRoll.be Fact Sheet for Healthcare Providers: https://www.woods-mathews.com/ This test is not yet approved or cleared by the [place] FDA and  has been authorized for detection and/or diagnosis of SARS-CoV-2 by FDA under an Emergency Use Authorization (EUA). This EUA will remain  in effect (meaning this test can be used) for the duration of the COVID-19 declaration under Section 56 4(b)(1) of the Act, 21 U.S.C. section 360bbb-3(b)(1), unless the authorization is terminated  or revoked sooner. Performed at [facility], [place] [address] [place], [place] [address]   SARS CORONAVIRUS 2 (TAT 6-24 HRS) Nasopharyngeal Nasopharyngeal Swab     Status: None   Collection Time: 07/02/19  2:17 PM   Specimen: Nasopharyngeal Swab  Result Value Ref Range Status   SARS Coronavirus 2 NEGATIVE NEGATIVE Final    Comment: (NOTE) SARS-CoV-2 target nucleic acids are NOT DETECTED. The SARS-CoV-2 RNA is generally detectable in upper and lower respiratory specimens during the acute phase of infection. Negative results do not preclude SARS-CoV-2 infection, do not rule out co-infections with other pathogens, and should not be used as the sole basis for treatment or other patient management decisions. Negative results must be combined with clinical observations, patient history, and epidemiological information. The expected result is Negative. Fact Sheet for Patients: SugarRoll.be Fact Sheet for Healthcare Providers: https://www.woods-mathews.com/ This test is not yet approved or cleared by the [place] FDA and  has been authorized for detection and/or diagnosis of SARS-CoV-2 by FDA under an Emergency Use Authorization (EUA). This EUA will remain  in effect (meaning this test can be used) for the duration of the COVID-19 declaration under Section 56 4(b)(1) of the Act, 21 U.S.C. section 360bbb-3(b)(1), unless the authorization is terminated or revoked sooner. Performed at [facility], [place] [address], [place], [place] [address]      Labs: BNP (last 3 results) No results for input(s): BNP in the last 8760 hours. Basic Metabolic Panel: Recent Labs  Lab 07/01/19 0600 07/01/19 1555 07/02/19 1122 07/03/19 0552 07/04/19 0812  NA 141 135 137 137 137  K 2.8* 3.1* 3.3* 3.5 3.7  CL 101 100 103 103 98  CO2 27 23 23 25 25   GLUCOSE 55* 132* 198* 136* 223*  BUN 10 8 6  5* 9  CREATININE 1.01 0.91 0.96 0.80 1.04  CALCIUM  9.5 9.6 8.9 9.0 9.7  MG  --   --   --  2.1  --    Liver Function Tests: Recent Labs  Lab 06/30/19 1454 07/01/19 0600 07/01/19 1555 07/02/19 1122 07/03/19 0552  AST 12* 14* 17 14* 11*  ALT 12 13 13 11 12   ALKPHOS 98 91 97 81 75  BILITOT 1.0 0.8 1.2 0.8 1.2  PROT 7.7 7.8 8.6* 7.4 6.8  ALBUMIN 4.5 4.6 5.1* 4.2 3.9   Recent Labs  Lab 06/28/19 0643 06/30/19 1454 07/01/19 0600 07/01/19 1555 07/02/19 1122  LIPASE 18 15 18 15 19    No results for input(s): AMMONIA in the last 168 hours. CBC: Recent Labs  Lab 06/29/19 1525  07/01/19 0600 07/01/19 1555 07/02/19 1122 07/03/19 0552 07/04/19 0812  WBC 10.4   < > 9.1 8.7 7.1 7.2 8.0  NEUTROABS 7.8*  --   --  5.6  --   --  6.0  HGB 15.1   < > 15.1 15.6 14.0 13.4 15.1  HCT 45.8   < > 45.7 46.4 43.1 40.9 45.9  MCV 80.8   < > 80.7 80.3 81.6 80.4 81.0  PLT 341   < > 376 357 335 301 386   < > = values in this interval not displayed.   Cardiac Enzymes: No results for input(s): CKTOTAL, CKMB, CKMBINDEX, TROPONINI in the last 168 hours. BNP: Invalid input(s): POCBNP CBG: Recent Labs  Lab 07/03/19 0012 07/03/19 0436 07/03/19 0710 07/03/19 0905 07/03/19 1142  GLUCAP 188* 203* 66* 124* 154*   D-Dimer No results for input(s): DDIMER in the last 72 hours. Hgb A1c Recent Labs    07/03/19 0552  HGBA1C 8.6*   Lipid Profile No results for input(s): CHOL, HDL, LDLCALC, TRIG, CHOLHDL, LDLDIRECT in the last 72 hours. Thyroid function studies No results for input(s): TSH, T4TOTAL, T3FREE, THYROIDAB in the last 72 hours.  Invalid input(s): FREET3 Anemia work up No results for input(s): VITAMINB12, FOLATE, FERRITIN, TIBC, IRON, RETICCTPCT in the last 72 hours. Urinalysis    Component Value Date/Time   COLORURINE YELLOW 07/04/2019 0727   APPEARANCEUR CLEAR 07/04/2019 0727   LABSPEC 1.019 07/04/2019 0727   PHURINE 6.0 07/04/2019 0727   GLUCOSEU 50 (A) 07/04/2019 0727   HGBUR NEGATIVE 07/04/2019 0727   BILIRUBINUR NEGATIVE  07/04/2019 0727   BILIRUBINUR negative 07/02/2018 1552   BILIRUBINUR negative 04/18/2016 1107   KETONESUR 80 (A) 07/04/2019 0727   PROTEINUR NEGATIVE 07/04/2019 0727   UROBILINOGEN 0.2 07/02/2018 1552   UROBILINOGEN 0.2 11/22/2014 0214   NITRITE NEGATIVE 07/04/2019 0727   LEUKOCYTESUR NEGATIVE 07/04/2019 0727   Sepsis Labs Invalid input(s): PROCALCITONIN,  WBC,  LACTICIDVEN Microbiology Recent Results (from the past 240 hour(s))  SARS CORONAVIRUS 2 (TAT 6-24 HRS) Nasopharyngeal Nasopharyngeal Swab     Status: None   Collection Time: 06/29/19  3:30 PM   Specimen: Nasopharyngeal Swab  Result Value Ref Range Status   SARS Coronavirus 2 NEGATIVE NEGATIVE Final    Comment: (NOTE) SARS-CoV-2 target nucleic acids are NOT DETECTED. The SARS-CoV-2 RNA is generally detectable in upper and lower respiratory specimens during the acute phase of infection. Negative results do not preclude SARS-CoV-2 infection, do not rule out co-infections with other pathogens, and should not be used as the sole basis for treatment or other patient management decisions. Negative results must be combined with clinical observations, patient history, and epidemiological information. The expected result is Negative. Fact Sheet for Patients: SugarRoll.be Fact Sheet for Healthcare Providers: https://www.woods-mathews.com/ This test is not yet approved or cleared by the [place] FDA and  has been authorized for detection and/or diagnosis of SARS-CoV-2 by FDA under an Emergency Use Authorization (EUA). This EUA will remain  in effect (meaning this test can be used) for the duration of the COVID-19 declaration under Section 56 4(b)(1) of the Act, 21 U.S.C. section 360bbb-3(b)(1), unless the authorization is terminated or revoked sooner. Performed at [facility], [place] [address], [place], [place] [address]   SARS CORONAVIRUS 2 (TAT 6-24 HRS) Nasopharyngeal  Nasopharyngeal Swab     Status: None   Collection Time: 07/02/19  2:17 PM   Specimen: Nasopharyngeal Swab  Result Value Ref Range Status   SARS Coronavirus 2 NEGATIVE NEGATIVE Final    Comment: (NOTE) SARS-CoV-2 target nucleic acids are NOT DETECTED. The SARS-CoV-2 RNA is generally detectable in upper and lower respiratory specimens during the acute phase of infection. Negative results do not preclude SARS-CoV-2 infection, do not rule out co-infections with other pathogens, and should not  be used as the sole basis for treatment or other patient management decisions. Negative results must be combined with clinical observations, patient history, and epidemiological information. The expected result is Negative. Fact Sheet for Patients: SugarRoll.be Fact Sheet for Healthcare Providers: https://www.woods-mathews.com/ This test is not yet approved or cleared by the [place] FDA and  has been authorized for detection and/or diagnosis of SARS-CoV-2 by FDA under an Emergency Use Authorization (EUA). This EUA will remain  in effect (meaning this test can be used) for the duration of the COVID-19 declaration under Section 56 4(b)(1) of the Act, 21 U.S.C. section 360bbb-3(b)(1), unless the authorization is terminated or revoked sooner. Performed at [facility], [place] [address] [place], [place] [address]      Time coordinating discharge:  I have spent 35 minutes face to face with the patient and on the ward discussing the patients care, assessment, plan and disposition with other care givers. >50% of the time was devoted counseling the patient about the risks and benefits of treatment/Discharge disposition and coordinating care.   SIGNED:   Damita Lack, MD  [facility] 07/04/2019, 12:56 PM   If 7PM-7AM, please contact night-coverage [email or web address]

## 2019-07-04 NOTE — ED Notes (Signed)
Pt provided ice chips and water for PO tolerance trail

## 2019-07-04 NOTE — ED Notes (Signed)
Pt made aware need urine sample 

## 2019-07-04 NOTE — Progress Notes (Signed)
TOC CM chart reviewed, pt with multiple ED visit for gastroparesis. He has follow up appt with [facility] on 07/07/2019 and [facility] on 07/18/2019. Pt had Rx for Norco for 1-2 tablets q 4, received call from [place] and they cannot fill Rx because exceeds daily limit for new narcotic. Requesting medication be changed to q 6 hours. Will need a new Rx escribed to pharmacy. Message sent EDP with info from [facility]. [place], [place] [facility] TOC CM [phone]

## 2019-07-04 NOTE — ED Triage Notes (Signed)
Pt arrives with complaints of generalized abdominal pain, nausea, and vomiting. States symptoms started at 3 am, took prescribed medication at 330 but reports vomiting it back up. States he has not been able to file his most recent prescription due to a pharmacy error.

## 2019-07-04 NOTE — Discharge Instructions (Signed)
Continue with the medications have been prescribed.  Clear liquid diet is recommended until you see the GI doctors.

## 2019-07-05 NOTE — ED Provider Notes (Signed)
[facility] DEPT Provider Note   CSN: [id number] Arrival date & time: 07/04/19  0450     History   Chief Complaint Chief Complaint  Patient presents with  . Abdominal Pain    HPI Jonathan Porter is a 28 y.o. male.     HPI  28 year old male comes in a chief complaint of abdominal pain.  His history of diabetes.  Is been struggling with abdominal discomfort along with nausea and vomiting for the last several weeks.  He reports that earlier today started having intractable nausea and vomiting with epigastric abdominal pain.  He is unsure what triggered his symptoms.  He has a GI appointment coming up this week.  Past Medical History:  Diagnosis Date  . Diabetes mellitus without complication [facility])     Patient Active Problem List   Diagnosis Date Noted  . Intractable nausea and vomiting 07/03/2019  . Gastroparesis 07/02/2019  . Influenza vaccine refused 06/03/2019  . Erectile dysfunction 06/03/2019  . Obesity (BMI 30-39.9) 06/03/2019  . Type 1 diabetes mellitus with diabetic polyneuropathy ([place]) 01/21/2019  . Type 1 diabetes mellitus with hyperglycemia ([place]) 01/21/2019  . Paronychia of finger of left hand 01/20/2017  . 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 Diabetes type 1, uncontrolled ([place]) 08/23/2015  . Onychomycosis of toenail 08/23/2015  . Phimosis 08/07/2014    Past Surgical History:  Procedure Laterality Date  . NO PAST SURGERIES          Home Medications    Prior to Admission medications   Medication Sig Start Date End Date Taking? Authorizing Provider  Accu-Chek FastClix Lancets MISC Use to check blood sugar up to 3 times daily. 06/03/19  Yes Ladell Pier, MD  bismuth subsalicylate (PEPTO BISMOL) 262 MG/15ML suspension Take 30 mLs by mouth every 6 (six) hours as needed for indigestion or diarrhea or loose stools.   Yes [provider]  Blood Glucose Monitoring Suppl (ACCU-CHEK GUIDE ME)  w/Device KIT 1 kit by Does not apply route 3 (three) times daily. Use to check blood sugar up to 3 times daily. 06/03/19  Yes Ladell Pier, MD  glucose blood (ACCU-CHEK GUIDE) test strip 4x a day 06/03/19  Yes Ladell Pier, MD  glucose blood (TRUE METRIX BLOOD GLUCOSE TEST) test strip 1 each by Other route 3 (three) times daily. 09/28/18  Yes Ladell Pier, MD  HYDROcodone-acetaminophen (NORCO) 5-325 MG tablet Take 1-2 tablets by mouth every 4 (four) hours as needed. 07/01/19  Yes Delo, Nathaneil Canary, MD  hyoscyamine (LEVSIN) 0.125 MG tablet Take 1 tablet (0.125 mg total) by mouth every 4 (four) hours as needed for cramping. 06/29/19  Yes Daleen Bo, MD  insulin aspart (NOVOLOG) 100 UNIT/ML FlexPen Inject 15 Units into the skin 3 (three) times daily with meals. 09/28/18  Yes Ladell Pier, MD  Insulin Glargine (LANTUS SOLOSTAR) 100 UNIT/ML Solostar Pen Inject 15 Units into the skin daily at 10 pm. 09/28/18  Yes Ladell Pier, MD  Insulin Pen Needle (B-D ULTRAFINE III SHORT PEN) 31G X 8 MM MISC 1 application by Does not apply route 4 (four) times daily. 05/21/18  Yes Ladell Pier, MD  ondansetron (ZOFRAN ODT) 8 MG disintegrating tablet 73m ODT q4 hours prn nausea 07/01/19  Yes Delo, DNathaneil Canary MD  potassium chloride (KLOR-CON) 10 MEQ tablet Take 2 tablets (20 mEq total) by mouth daily for 3 days. 07/01/19 07/04/19 Yes MNuala AlphaA, PA-C  sildenafil (VIAGRA) 50 MG  tablet TAKE 1 TABLET BY MOUTH A HALF HOUR PRIOR TO SEXUAL INTERCOURSE AS NEEDED. *LIMIT TO 1 TABLET PER 24 HOURS* Patient taking differently: Take 50 mg by mouth as needed for erectile dysfunction. TAKE 1 TABLET BY MOUTH A HALF HOUR PRIOR TO SEXUAL INTERCOURSE AS NEEDED. *LIMIT TO 1 TABLET PER 24 HOURS* 06/03/19  Yes Ladell Pier, MD    Family History Family History  Problem Relation Age of Onset  . Thyroid disease Mother   . Diabetes Maternal Aunt   . Diabetes Maternal Grandmother   . Diabetes Maternal  Grandfather   . Thyroid disease Other     Social History Social History   Tobacco Use  . Smoking status: Current Every Day Smoker    Packs/day: 0.25    Years: 6.00    Pack years: 1.50    Types: Cigarettes  . Smokeless tobacco: Never Used  Substance Use Topics  . Alcohol use: Yes    Comment: social   . Drug use: Not Currently    Types: Marijuana    Comment: last used 4 days ago.     Allergies   Shellfish allergy   Review of Systems Review of Systems  Constitutional: Positive for activity change.  Gastrointestinal: Positive for abdominal pain, nausea and vomiting.  Genitourinary: Negative for dysuria.  Musculoskeletal: Negative for back pain.  Allergic/Immunologic: Negative for immunocompromised state.  All other systems reviewed and are negative.    Physical Exam Updated Vital Signs BP 126/86 (BP Location: Right Arm)   Pulse 70   Temp 98.9 F (37.2 C) (Oral)   Resp 12   SpO2 100%   Physical Exam Vitals signs and nursing note reviewed.  Constitutional:      Appearance: He is well-developed.  HENT:     Head: Atraumatic.  Neck:     Musculoskeletal: Neck supple.  Cardiovascular:     Rate and Rhythm: Normal rate.  Pulmonary:     Effort: Pulmonary effort is normal.  Abdominal:     Tenderness: There is generalized abdominal tenderness and tenderness in the epigastric area. There is no guarding or rebound.  Skin:    General: Skin is warm.  Neurological:     Mental Status: He is alert and oriented to person, place, and time.      ED Treatments / Results  Labs (all labs ordered are listed, but only abnormal results are displayed) Labs Reviewed  BASIC METABOLIC PANEL - Abnormal; Notable for the following components:      Result Value   Glucose, Bld 223 (*)    All other components within normal limits  URINALYSIS, ROUTINE W REFLEX MICROSCOPIC - Abnormal; Notable for the following components:   Glucose, UA 50 (*)    Ketones, ur 80 (*)    All other  components within normal limits  RAPID URINE DRUG SCREEN, HOSP PERFORMED - Abnormal; Notable for the following components:   Tetrahydrocannabinol POSITIVE (*)    All other components within normal limits  CBC WITH DIFFERENTIAL/PLATELET    EKG EKG Interpretation  Date/Time:  Monday July 04 2019 07:50:29 EDT Ventricular Rate:  65 PR Interval:    QRS Duration: 117 QT Interval:  420 QTC Calculation: 437 R Axis:   70 Text Interpretation:  Incomplete analysis due to missing data in precordial lead(s) Sinus rhythm Short PR interval Nonspecific intraventricular conduction delay No acute changes Confirmed by Varney Biles [phone]) on 07/04/2019 12:13:41 PM   Radiology No results found.  Procedures Procedures (including critical care time)  Medications Ordered in ED Medications  ondansetron (ZOFRAN-ODT) disintegrating tablet 4 mg (4 mg Oral Given 07/04/19 0655)  sodium chloride 0.9 % bolus 1,000 mL (0 mLs Intravenous Stopped 07/04/19 1000)  metoCLOPramide (REGLAN) injection 10 mg (10 mg Intravenous Given 07/04/19 0808)  ketorolac (TORADOL) 15 MG/ML injection 15 mg (15 mg Intravenous Given 07/04/19 0808)  sucralfate (CARAFATE) tablet 1 g (1 g Oral Given 07/04/19 0830)  fentaNYL (SUBLIMAZE) injection 50 mcg (50 mcg Intravenous Given 07/04/19 0952)  haloperidol lactate (HALDOL) injection 5 mg (5 mg Intravenous Given 07/04/19 8937)     Initial Impression / Assessment and Plan / ED Course  I have reviewed the triage vital signs and the nursing notes.  Pertinent labs & imaging results that were available during my care of the patient were reviewed by me and considered in my medical decision making (see chart for details).        DDx includes: Pancreatitis Hepatobiliary pathology including cholecystitis Gastritis/PUD SBO ACS syndrome  Patient has a chief complaint of abdominal pain. Symptoms are concerning for gastroparesis versus gastritis.  He has recurrent issues with  this type of pain.  We will try to manage his symptoms.  Final Clinical Impressions(s) / ED Diagnoses   Final diagnoses:  Gastroparesis    ED Discharge Orders    None       Varney Biles, MD 07/05/19 1601

## 2019-07-06 ENCOUNTER — Other Ambulatory Visit: Payer: Self-pay

## 2019-07-06 ENCOUNTER — Encounter (HOSPITAL_COMMUNITY): Payer: Self-pay | Admitting: Obstetrics and Gynecology

## 2019-07-06 ENCOUNTER — Emergency Department (HOSPITAL_COMMUNITY)
Admission: EM | Admit: 2019-07-06 | Discharge: 2019-07-06 | Disposition: A | Discharge status: Home / Self Care | Payer: Medicaid Other | Attending: Emergency Medicine | Admitting: Emergency Medicine

## 2019-07-06 DIAGNOSIS — Z794 Long term (current) use of insulin: Secondary | ICD-10-CM | POA: Diagnosis not present

## 2019-07-06 DIAGNOSIS — E109 Type 1 diabetes mellitus without complications: Secondary | ICD-10-CM | POA: Insufficient documentation

## 2019-07-06 DIAGNOSIS — K3184 Gastroparesis: Secondary | ICD-10-CM | POA: Insufficient documentation

## 2019-07-06 DIAGNOSIS — F17210 Nicotine dependence, cigarettes, uncomplicated: Secondary | ICD-10-CM | POA: Insufficient documentation

## 2019-07-06 DIAGNOSIS — R112 Nausea with vomiting, unspecified: Secondary | ICD-10-CM | POA: Diagnosis not present

## 2019-07-06 DIAGNOSIS — R1013 Epigastric pain: Secondary | ICD-10-CM | POA: Diagnosis present

## 2019-07-06 HISTORY — DX: Gastroparesis: K31.84

## 2019-07-06 LAB — COMPREHENSIVE METABOLIC PANEL
ALT: 12 U/L (ref 0–44)
AST: 14 U/L — ABNORMAL LOW (ref 15–41)
Albumin: 4.4 g/dL (ref 3.5–5.0)
Alkaline Phosphatase: 84 U/L (ref 38–126)
Anion gap: 10 (ref 5–15)
BUN: 12 mg/dL (ref 6–20)
CO2: 25 mmol/L (ref 22–32)
Calcium: 9.4 mg/dL (ref 8.9–10.3)
Chloride: 101 mmol/L (ref 98–111)
Creatinine, Ser: 0.99 mg/dL (ref 0.61–1.24)
GFR calc Af Amer: >60 mL/min (ref >60)
GFR calc non Af Amer: >60 mL/min (ref >60)
Glucose, Bld: 249 mg/dL — ABNORMAL HIGH (ref 70–99)
Potassium: 4.3 mmol/L (ref 3.5–5.1)
Sodium: 136 mmol/L (ref 135–145)
Total Bilirubin: 0.9 mg/dL (ref 0.3–1.2)
Total Protein: 7.7 g/dL (ref 6.5–8.1)

## 2019-07-06 LAB — URINALYSIS, ROUTINE W REFLEX MICROSCOPIC
Bilirubin Urine: NEGATIVE
Glucose, UA: 150 mg/dL — AB
Hgb urine dipstick: NEGATIVE
Ketones, ur: 80 mg/dL — AB
Leukocytes,Ua: NEGATIVE
Nitrite: NEGATIVE
Protein, ur: NEGATIVE mg/dL
Specific Gravity, Urine: 1.03 (ref 1.005–1.030)
pH: 5 (ref 5.0–8.0)

## 2019-07-06 LAB — CBC
HCT: 46.5 % (ref 39.0–52.0)
Hemoglobin: 15.2 g/dL (ref 13.0–17.0)
MCH: 27 pg (ref 26.0–34.0)
MCHC: 32.7 g/dL (ref 30.0–36.0)
MCV: 82.6 fL (ref 80.0–100.0)
Platelets: 380 10*3/uL (ref 150–400)
RBC: 5.63 MIL/uL (ref 4.22–5.81)
RDW: 13.6 % (ref 11.5–15.5)
WBC: 13.5 10*3/uL — ABNORMAL HIGH (ref 4.0–10.5)
nRBC: 0 % (ref 0.0–0.2)

## 2019-07-06 LAB — LIPASE, BLOOD: Lipase: 20 U/L (ref 11–51)

## 2019-07-06 MED ORDER — HALOPERIDOL LACTATE 5 MG/ML IJ SOLN
5.0000 mg | Freq: Once | INTRAMUSCULAR | Status: AC
  Administered 2019-07-06: 5 mg via INTRAVENOUS
  Filled 2019-07-06: qty 1

## 2019-07-06 MED ORDER — BLOOD GLUCOSE MONITOR KIT: PACK | 0 refills | Status: DC

## 2019-07-06 MED ORDER — SODIUM CHLORIDE 0.9% FLUSH
3.0000 mL | Freq: Once | INTRAVENOUS | Status: AC
  Administered 2019-07-06: 3 mL via INTRAVENOUS

## 2019-07-06 MED ORDER — DIPHENHYDRAMINE HCL 50 MG/ML IJ SOLN
12.5000 mg | Freq: Once | INTRAMUSCULAR | Status: AC
  Administered 2019-07-06: 17:00:00 12.5 mg via INTRAVENOUS
  Filled 2019-07-06: qty 1

## 2019-07-06 MED ORDER — KETOROLAC TROMETHAMINE 30 MG/ML IJ SOLN
30.0000 mg | Freq: Once | INTRAMUSCULAR | Status: AC
  Administered 2019-07-06: 17:00:00 30 mg via INTRAVENOUS
  Filled 2019-07-06: qty 1

## 2019-07-06 MED ORDER — PROMETHAZINE HCL 25 MG PO TABS: 25.0000 mg | ORAL_TABLET | Freq: Four times a day (QID) | ORAL | 0 refills | Status: DC | PRN

## 2019-07-06 MED ORDER — SODIUM CHLORIDE 0.9 % IV BOLUS
1000.0000 mL | Freq: Once | INTRAVENOUS | Status: AC
  Administered 2019-07-06: 17:00:00 1000 mL via INTRAVENOUS

## 2019-07-06 NOTE — ED Notes (Signed)
Unable to collect vitals patient is on a face time call

## 2019-07-06 NOTE — ED Triage Notes (Signed)
Patient reports to the ED on facetime. NT. Unable to get triage as he is screaming on the phone.  Patient checked in for abdominal pain and has been seen for the same multiple times in the past week.

## 2019-07-06 NOTE — Progress Notes (Signed)
TOC CM received referral for glucometer. Pt has Medicaid that will cover the cost of glucometer. EDP will provide pt with a Rx at dc. [place], [place] ED TOC CM [phone]

## 2019-07-06 NOTE — Discharge Instructions (Addendum)
Your medicaid will cover you for a glucometer at no cost, per our case manager. It is important you monitor your blood sugars regularly. Attend your GI appointment tomorrow for further evaluation of your ongoing issues with gastroparesis. Avoid marijuana as this can worsen your symptoms.

## 2019-07-06 NOTE — ED Provider Notes (Signed)
[facility] DEPT Provider Note   CSN: [id number] Arrival date & time: 07/06/19  1211     History   Chief Complaint Chief Complaint  Patient presents with  . Abdominal Pain    HPI Jonathan Porter is a 28 y.o. male w PMHx T1DM, gastroparesis, presenting to the ED with visit for nausea, vomiting and abdominal pain assoc with his gastroparesis. Symptoms have been ongoing since Monday of last week. He has been seen multiple times this month, 10 times prior to today including an admission on 07/02/2019.  He was discharged on 07/03/2019 and back again to the ED on 07/04/2019 for recurrence of symptoms.  He states he is simply having trouble controlling his symptoms at home with Zofran and Bentyl.  He does have a outpatient GI appointment tomorrow which he plans to attend for further management of this.  His symptoms consist of intermittent epigastric abdominal pain that is described as burning and crampy.  He has had about 10 episodes of emesis today though is feeling somewhat better regarding the nausea on evaluation.  He reports his pain is well improved with Toradol during previous ED visits as well as the Haldol he was given during last ED visit.  He states he did not find much relief from Reglan or Zofran.  Of note, he reports frequent use of marijuana though has decreased use the last week to help his symptoms.  No fevers, urinary symptoms, or URI symptoms.  He states he has not had a glucometer for at least 1 month and therefore has been unable to monitor his blood sugars.  He did not take his insulin today.    The history is provided by the patient and medical records.    Past Medical History:  Diagnosis Date  . Diabetes mellitus without complication ([place])   . Gastroparesis     Patient Active Problem List   Diagnosis Date Noted  . Intractable nausea and vomiting 07/03/2019  . Gastroparesis 07/02/2019  . Influenza vaccine refused 06/03/2019  . Erectile  dysfunction 06/03/2019  . Obesity (BMI 30-39.9) 06/03/2019  . Type 1 diabetes mellitus with diabetic polyneuropathy ([place]) 01/21/2019  . Type 1 diabetes mellitus with hyperglycemia ([place]) 01/21/2019  . Paronychia of finger of left hand 01/20/2017  . 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 Diabetes type 1, uncontrolled ([place]) 08/23/2015  . Onychomycosis of toenail 08/23/2015  . Phimosis 08/07/2014    Past Surgical History:  Procedure Laterality Date  . NO PAST SURGERIES          Home Medications    Prior to Admission medications   Medication Sig Start Date End Date Taking? Authorizing Provider  bismuth subsalicylate (PEPTO BISMOL) 262 MG/15ML suspension Take 30 mLs by mouth every 6 (six) hours as needed for indigestion or diarrhea or loose stools.   Yes [provider]  hyoscyamine (LEVSIN) 0.125 MG tablet Take 1 tablet (0.125 mg total) by mouth every 4 (four) hours as needed for cramping. 06/29/19  Yes Daleen Bo, MD  insulin aspart (NOVOLOG) 100 UNIT/ML FlexPen Inject 15 Units into the skin 3 (three) times daily with meals. 09/28/18  Yes Ladell Pier, MD  Insulin Glargine (LANTUS SOLOSTAR) 100 UNIT/ML Solostar Pen Inject 15 Units into the skin daily at 10 pm. 09/28/18  Yes Ladell Pier, MD  ondansetron (ZOFRAN ODT) 8 MG disintegrating tablet 11m ODT q4 hours prn nausea 07/01/19  Yes DVeryl Speak MD  sildenafil (VIAGRA) 50 MG tablet  TAKE 1 TABLET BY MOUTH A HALF HOUR PRIOR TO SEXUAL INTERCOURSE AS NEEDED. *LIMIT TO 1 TABLET PER 24 HOURS* Patient taking differently: Take 50 mg by mouth as needed for erectile dysfunction. TAKE 1 TABLET BY MOUTH A HALF HOUR PRIOR TO SEXUAL INTERCOURSE AS NEEDED. *LIMIT TO 1 TABLET PER 24 HOURS* 06/03/19  Yes Ladell Pier, MD  Accu-Chek FastClix Lancets MISC Use to check blood sugar up to 3 times daily. 06/03/19   Ladell Pier, MD  blood glucose meter kit and supplies KIT Dispense based on patient and  insurance preference. Use up to four times daily as directed. (FOR ICD-9 250.00, 250.01). 07/06/19   , [place] N, PA-C  Blood Glucose Monitoring Suppl (ACCU-CHEK GUIDE ME) w/Device KIT 1 kit by Does not apply route 3 (three) times daily. Use to check blood sugar up to 3 times daily. 06/03/19   Ladell Pier, MD  glucose blood (ACCU-CHEK GUIDE) test strip 4x a day 06/03/19   Ladell Pier, MD  glucose blood (TRUE METRIX BLOOD GLUCOSE TEST) test strip 1 each by Other route 3 (three) times daily. 09/28/18   Ladell Pier, MD  HYDROcodone-acetaminophen (NORCO) 5-325 MG tablet Take 1-2 tablets by mouth every 4 (four) hours as needed. 07/01/19   Veryl Speak, MD  Insulin Pen Needle (B-D ULTRAFINE III SHORT PEN) 31G X 8 MM MISC 1 application by Does not apply route 4 (four) times daily. 05/21/18   Ladell Pier, MD  potassium chloride (KLOR-CON) 10 MEQ tablet Take 2 tablets (20 mEq total) by mouth daily for 3 days. 07/01/19 07/04/19  Nuala Alpha A, PA-C  promethazine (PHENERGAN) 25 MG tablet Take 1 tablet (25 mg total) by mouth every 6 (six) hours as needed for nausea or vomiting. 07/06/19   , [place] N, PA-C    Family History Family History  Problem Relation Age of Onset  . Thyroid disease Mother   . Diabetes Maternal Aunt   . Diabetes Maternal Grandmother   . Diabetes Maternal Grandfather   . Thyroid disease Other     Social History Social History   Tobacco Use  . Smoking status: Current Every Day Smoker    Packs/day: 0.25    Years: 6.00    Pack years: 1.50    Types: Cigarettes  . Smokeless tobacco: Never Used  Substance Use Topics  . Alcohol use: Yes    Comment: social   . Drug use: Not Currently    Types: Marijuana    Comment: last used 4 days ago.     Allergies   Shellfish allergy   Review of Systems Review of Systems  Constitutional: Negative for fever.  Gastrointestinal: Positive for abdominal pain, nausea and vomiting.  All other  systems reviewed and are negative.    Physical Exam Updated Vital Signs BP 138/84   Pulse 78   Temp 99 F (37.2 C) (Oral)   Resp 16   SpO2 98%   Physical Exam Vitals signs and nursing note reviewed.  Constitutional:      General: He is not in acute distress.    Appearance: He is well-developed.  HENT:     Head: Normocephalic and atraumatic.  Eyes:     Conjunctiva/sclera: Conjunctivae normal.  Cardiovascular:     Rate and Rhythm: Normal rate and regular rhythm.  Pulmonary:     Effort: Pulmonary effort is normal. No respiratory distress.     Breath sounds: Normal breath sounds.  Abdominal:     General:  Bowel sounds are normal.     Palpations: Abdomen is soft.     Tenderness: There is generalized abdominal tenderness. There is no guarding or rebound.  Skin:    General: Skin is warm.  Neurological:     Mental Status: He is alert.  Psychiatric:        Behavior: Behavior normal.      ED Treatments / Results  Labs (all labs ordered are listed, but only abnormal results are displayed) Labs Reviewed  COMPREHENSIVE METABOLIC PANEL - Abnormal; Notable for the following components:      Result Value   Glucose, Bld 249 (*)    AST 14 (*)    All other components within normal limits  CBC - Abnormal; Notable for the following components:   WBC 13.5 (*)    All other components within normal limits  URINALYSIS, ROUTINE W REFLEX MICROSCOPIC - Abnormal; Notable for the following components:   Glucose, UA 150 (*)    Ketones, ur 80 (*)    All other components within normal limits  LIPASE, BLOOD    EKG None  Radiology No results found.  Procedures Procedures (including critical care time)  Medications Ordered in ED Medications  sodium chloride flush (NS) 0.9 % injection 3 mL (3 mLs Intravenous Given 07/06/19 1713)  sodium chloride 0.9 % bolus 1,000 mL (0 mLs Intravenous Stopped 07/06/19 1855)  ketorolac (TORADOL) 30 MG/ML injection 30 mg (30 mg Intravenous Given  07/06/19 1712)  haloperidol lactate (HALDOL) injection 5 mg (5 mg Intravenous Given 07/06/19 1713)  diphenhydrAMINE (BENADRYL) injection 12.5 mg (12.5 mg Intravenous Given 07/06/19 1713)     Initial Impression / Assessment and Plan / ED Course  I have reviewed the triage vital signs and the nursing notes.  Pertinent labs & imaging results that were available during my care of the patient were reviewed by me and considered in my medical decision making (see chart for details).  Clinical Course as of Jul 06 1911  Wed Jul 06, 2019  1827 Pt re-evaluated, reports improvement in symptoms.   [JR]    Clinical Course User Index [JR] , [place] N, PA-C       Patient with type 1 diabetes and chronic gastroparesis, presenting for nausea and vomiting with abdominal pain.  Symptoms have been ongoing since last Monday and he has been seen multiple times in the ED for his symptoms.  He states he is having trouble managing him at home though he does have a GI appointment tomorrow morning which he intends to attend.  His abdominal pain is his usual pain.  Overall he states his symptoms are somewhat better than his initial presentation though not managed well with Zofran and Reglan.  He is overall in no distress, abdominal exam reveals no peritoneal signs with generalized tenderness.  He had normal CT scan on 07/01/2019 with similar presentation and symptoms today are not worse.  Labs today with hyperglycemia of 249 though no signs of DKA.  Mild leukocytosis, likely reactive from recurrent vomiting.  Otherwise patient is afebrile with stable vital signs.  Will treat symptoms, patient requesting Toradol as this provided improvement in his abdominal pain.  He also reports Haldol improves his vomiting as well.   After IV medications, patient reports improvement in symptoms.  He will be discharged with symptomatic management and strong encouragement to attend ST appointment tomorrow.  Patient verbalized  understanding and is comfortable with discharge.  Consulted with case management as patient states he cannot afford a  glucometer who informed me his glucometer would be free of charge per his Medicaid coverage.  Patient informed and glucometer prescribed.  Discussed results, findings, treatment and follow up. Patient advised of return precautions. Patient verbalized understanding and agreed with plan.  Final Clinical Impressions(s) / ED Diagnoses   Final diagnoses:  Gastroparesis    ED Discharge Orders         Ordered    promethazine (PHENERGAN) 25 MG tablet  Every 6 hours PRN     07/06/19 1843    blood glucose meter kit and supplies KIT     07/06/19 1843           , [place] N, PA-C 07/06/19 1912    Nat Christen, MD 07/07/19 1712

## 2019-07-07 ENCOUNTER — Ambulatory Visit: Payer: Medicaid Other | Admitting: Physician Assistant

## 2019-07-07 ENCOUNTER — Encounter: Payer: Self-pay | Admitting: Physician Assistant

## 2019-07-07 ENCOUNTER — Other Ambulatory Visit (INDEPENDENT_AMBULATORY_CARE_PROVIDER_SITE_OTHER): Payer: Medicaid Other

## 2019-07-07 VITALS — BP 138/90 | HR 74 | Temp 97.9°F | Ht 72.0 in | Wt 209.6 lb

## 2019-07-07 DIAGNOSIS — U071 COVID-19: Secondary | ICD-10-CM | POA: Diagnosis not present

## 2019-07-07 DIAGNOSIS — R112 Nausea with vomiting, unspecified: Secondary | ICD-10-CM | POA: Diagnosis not present

## 2019-07-07 DIAGNOSIS — R1013 Epigastric pain: Secondary | ICD-10-CM | POA: Diagnosis not present

## 2019-07-07 LAB — H. PYLORI ANTIBODY, IGG: H Pylori IgG: NEGATIVE

## 2019-07-07 MED ORDER — ONDANSETRON 8 MG PO TBDP: ORAL_TABLET | ORAL | 4 refills | Status: DC

## 2019-07-07 NOTE — Patient Instructions (Addendum)
Your provider has requested that you go to the basement level for lab work before leaving today. Press "B" on the elevator. The lab is located at the first door on the left as you exit the elevator.  We have sent the following medications to your pharmacy for you to pick up at your convenience:  Zofran   You have been scheduled for a gastric emptying scan at [facility] on 07/12/2019 at 8:00am. Please arrive at least 15 minutes prior to your appointment for registration. Please make certain not to have anything to eat or drink after midnight the night before your test. Hold all stomach medications (ex: Zofran, phenergan, Reglan) 48 hours prior to your test. If you need to reschedule your appointment, please contact radiology scheduling at [phone]. _____________________________________________________________________ A gastric-emptying study measures how long it takes for food to move through your stomach. There are several ways to measure stomach emptying. In the most common test, you eat food that contains a small amount of radioactive material. A scanner that detects the movement of the radioactive material is placed over your abdomen to monitor the rate at which food leaves your stomach. This test normally takes about 4 hours to complete. _____________________________________________________________________  Use Miralax daily in 8 ounces of water  Discontinue Levsin  Look at step 2 and 3 of gastroparesis diet

## 2019-07-07 NOTE — Progress Notes (Signed)
Subjective:    Patient ID: Jonathan Porter, male    DOB: 1990-10-31, 28 y.o.   MRN: [id number]  HPI Jonathan Porter is a 28 year old African-American male, new to GI today referred after recent ER visit for evaluation of recurrent episodes of nausea vomiting and upper abdominal pain.  Patient has had 12 ER visits since July 2020. He has history of insulin-dependent diabetes mellitus with polyneuropathy, and has been given a tentative diagnosis of gastroparesis. He had an overnight admission 10/10 through 07/03/2019 but left AMA. CT scan of the abdomen and pelvis October 2020 was negative, he was noted to have a stable 6 mm nodule in the right middle lobe felt to be a benign node.  Most recent drug screen positive for THC. Gastric emptying scan had been done in October 2019 and was normal. Says he has had his current symptoms for a couple of years altogether, progressive over the past 4 months and much worse over the past few weeks.  He says he will have occasional days that he feels fine, like this morning but most days will wake up feeling sick and will immediately vomit.  Sometimes he vomits just fluid and other times will bring up some food that he had eaten the night before.  He says he can usually tell in the morning when he first wakes up with his stomach feels full and heavy that he is going to vomit.  He may have several episodes of vomiting in the morning.  Appetite has been decreased, he has been afraid to eat at times and says he has "been grazing" weight is down about 11 pounds over the past month or so.  Denies any regular EtOH, no regular aspirin or NSAIDs.  He usually has upper abdominal pain which she describes as aching burning and cramping in nature on the days that he is having nausea and vomiting.  He denies any chronic GERD symptoms.  He will have occasional constipation no melena or hematochezia and no hematemesis. He has a prescription for Zofran 8 ODT which she does find helpful, has been  using as needed, no PPI.  He says has been given Toradol in the past for pain and also has a prescription for Levsin sublingual for pain and abdominal cramping.  He also has a prescription for capsaicin and says he has used this on occasion and that sometimes it does seem to help.  Review of Systems Pertinent positive and negative review of systems were noted in the above HPI section.  All other review of systems was otherwise negative.  Outpatient Encounter Medications as of 07/07/2019  Medication Sig  . Accu-Chek FastClix Lancets MISC Use to check blood sugar up to 3 times daily.  Marland Kitchen bismuth subsalicylate (PEPTO BISMOL) 262 MG/15ML suspension Take 30 mLs by mouth every 6 (six) hours as needed for indigestion or diarrhea or loose stools.  . blood glucose meter kit and supplies KIT Dispense based on patient and insurance preference. Use up to four times daily as directed. (FOR ICD-9 250.00, 250.01).  . Blood Glucose Monitoring Suppl (ACCU-CHEK GUIDE ME) w/Device KIT 1 kit by Does not apply route 3 (three) times daily. Use to check blood sugar up to 3 times daily.  . capsaicin (ZOSTRIX) 0.025 % cream Apply topically as needed.  Marland Kitchen glucose blood (ACCU-CHEK GUIDE) test strip 4x a day  . insulin aspart (NOVOLOG) 100 UNIT/ML FlexPen Inject 15 Units into the skin 3 (three) times daily with meals.  . Insulin Glargine (  LANTUS SOLOSTAR) 100 UNIT/ML Solostar Pen Inject 15 Units into the skin daily at 10 pm.  . Insulin Pen Needle (B-D ULTRAFINE III SHORT PEN) 31G X 8 MM MISC 1 application by Does not apply route 4 (four) times daily.  . ondansetron (ZOFRAN ODT) 8 MG disintegrating tablet Take one tablet every morning before eating then repeat in 6 hours  . sildenafil (VIAGRA) 50 MG tablet TAKE 1 TABLET BY MOUTH A HALF HOUR PRIOR TO SEXUAL INTERCOURSE AS NEEDED. *LIMIT TO 1 TABLET PER 24 HOURS* (Patient taking differently: Take 50 mg by mouth as needed for erectile dysfunction. TAKE 1 TABLET BY MOUTH A HALF HOUR  PRIOR TO SEXUAL INTERCOURSE AS NEEDED. *LIMIT TO 1 TABLET PER 24 HOURS*)  . [DISCONTINUED] hyoscyamine (LEVSIN) 0.125 MG tablet Take 1 tablet (0.125 mg total) by mouth every 4 (four) hours as needed for cramping.  . [DISCONTINUED] ondansetron (ZOFRAN ODT) 8 MG disintegrating tablet 38m ODT q4 hours prn nausea  . potassium chloride (KLOR-CON) 10 MEQ tablet Take 2 tablets (20 mEq total) by mouth daily for 3 days.  . promethazine (PHENERGAN) 25 MG tablet Take 1 tablet (25 mg total) by mouth every 6 (six) hours as needed for nausea or vomiting. (Patient not taking: Reported on 07/07/2019)  . [DISCONTINUED] glucose blood (TRUE METRIX BLOOD GLUCOSE TEST) test strip 1 each by Other route 3 (three) times daily.  . [DISCONTINUED] HYDROcodone-acetaminophen (NORCO) 5-325 MG tablet Take 1-2 tablets by mouth every 4 (four) hours as needed.   No facility-administered encounter medications on file as of 07/07/2019.    Allergies  Allergen Reactions  . Shellfish Allergy Anaphylaxis   Patient Active Problem List   Diagnosis Date Noted  . COVID-19 virus infection 07/07/2019  . Intractable nausea and vomiting 07/03/2019  . Gastroparesis 07/02/2019  . Influenza vaccine refused 06/03/2019  . Erectile dysfunction 06/03/2019  . Obesity (BMI 30-39.9) 06/03/2019  . Type 1 diabetes mellitus with diabetic polyneuropathy ([place] 01/21/2019  . Type 1 diabetes mellitus with hyperglycemia ([place] 01/21/2019  . Paronychia of finger of left hand 01/20/2017  . 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 Diabetes type 1, uncontrolled ([facility] 08/23/2015  . Onychomycosis of toenail 08/23/2015  . Phimosis 08/07/2014   Social History   Socioeconomic History  . Marital status: Single    Spouse name: Not on file  . Number of children: Not on file  . Years of education: Not on file  . Highest education level: Not on file  Occupational History  . Not on file  Social Needs  . Financial resource strain: Not  on file  . Food insecurity    Worry: Not on file    Inability: Not on file  . Transportation needs    Medical: Not on file    Non-medical: Not on file  Tobacco Use  . Smoking status: Current Every Day Smoker    Packs/day: 0.25    Years: 6.00    Pack years: 1.50    Types: Cigarettes  . Smokeless tobacco: Never Used  Substance and Sexual Activity  . Alcohol use: Yes    Comment: social   . Drug use: Not Currently    Types: Marijuana    Comment: last used 4 days ago.  .Marland KitchenSexual activity: Yes    Birth control/protection: Condom    Comment: stated used condom 80% of the time   Lifestyle  . Physical activity    Days per week: Not on file    Minutes  per session: Not on file  . Stress: Not on file  Relationships  . Social Herbalist on phone: Not on file    Gets together: Not on file    Attends religious service: Not on file    Active member of club or organization: Not on file    Attends meetings of clubs or organizations: Not on file    Relationship status: Not on file  . Intimate partner violence    Fear of current or ex partner: Not on file    Emotionally abused: Not on file    Physically abused: Not on file    Forced sexual activity: Not on file  Other Topics Concern  . Not on file  Social History Narrative  . Not on file    Mr. Wilmes family history includes Diabetes in his maternal aunt, maternal grandfather, and maternal grandmother; Thyroid disease in his mother and another family member.      Objective:    Vitals:   07/07/19 1002  BP: 138/90  Pulse: 74  Temp: 97.9 F (36.6 C)    Physical Exam;Well-developed well-nourished young African-American male male in no acute distress.  Height, Weight, 209 BMI 28.4  HEENT; nontraumatic normocephalic, EOMI, PER R LA, sclera anicteric. Oropharynx; not examined/mask/Covid Neck; supple, no JVD Cardiovascular; regular rate and rhythm with S1-S2, no murmur rub or gallop Pulmonary; Clear bilaterally  Abdomen; soft, nontender, nondistended, no palpable mass or hepatosplenomegaly, bowel sounds are active.  No succussion splash Rectal; not done Skin; benign exam, no jaundice rash or appreciable lesions Extremities; no clubbing cyanosis or edema skin warm and dry Neuro/Psych; alert and oriented x4, grossly nonfocal mood and affect appropriate       Assessment & Plan:     #78 28 year old African-American male, insulin-dependent diabetic with 2-year history of intermittent nausea vomiting and upper abdominal pain with significant worsening of symptoms over the past 4 months.  He is having several days per week with early morning nausea vomiting and abdominal pain, sometimes vomiting undigested food. He complains of frequent heavy full feeling in his upper abdomen, has had decrease in appetite and a 10 to 11 pound weight loss over the past month or so.  He had been a regular marijuana user, says he has not had any marijuana over the past 2 weeks after he was told this may be contributing at a recent ER visit. A lot of his symptoms do sound consistent with underlying gastroparesis, and he certainly could have a combination of diabetic gastroparesis and Cannabis hyperemesis.  #2 insulin-dependent diabetes mellitus with history of polyneuropathy  Plan; start gastroparesis diet step 2 and step 3, and small frequent feedings were discussed with the patient. Start Zofran ODT 8 mg every morning prior to eating, then repeat in 6 hours, refills sent Stop Levsin as may worsen gastroparetic symptoms We discussed importance of good glucose control. Start MiraLAX 17 g in 8 ounces of water daily as needed for obstipation. We discussed cannabis hyperemesis syndrome, he understands and intends to stay off of marijuana as he has been miserable with his GI symptoms. Check H. pylori antibody and treat if positive Schedule for gastric emptying scan-if this is positive will start trial of metoclopramide 5 mg AC,  if negative he may need EGD with Dr. Rush Landmark.    Genia Harold PA-C 07/07/2019   Cc: Ladell Pier, MD

## 2019-07-11 NOTE — Progress Notes (Signed)
Attending Physician's Attestation   I have reviewed the chart.   I agree with the Advanced Practitioner's note, impression, and recommendations with any updates as below.  We will see results of gastric emptying study.  Even if the gastric emptying study is positive, I think that a diagnostic upper endoscopy due to the weight loss would be reasonable to consider.   Justice Britain, MD [facility] Advanced Endoscopy Office # [phone]

## 2019-07-12 ENCOUNTER — Encounter (HOSPITAL_COMMUNITY)
Admission: RE | Admit: 2019-07-12 | Discharge: 2019-07-12 | Disposition: A | Discharge status: Home / Self Care | Payer: Medicaid Other | Source: Ambulatory Visit | Attending: Physician Assistant | Admitting: Physician Assistant

## 2019-07-12 ENCOUNTER — Other Ambulatory Visit: Payer: Self-pay

## 2019-07-12 DIAGNOSIS — R112 Nausea with vomiting, unspecified: Secondary | ICD-10-CM | POA: Diagnosis not present

## 2019-07-12 DIAGNOSIS — R1013 Epigastric pain: Secondary | ICD-10-CM | POA: Diagnosis present

## 2019-07-12 MED ORDER — TECHNETIUM TC 99M SULFUR COLLOID
2.2000 | Freq: Once | INTRAVENOUS | Status: AC | PRN
  Administered 2019-07-12: 2.2 via INTRAVENOUS

## 2019-07-18 ENCOUNTER — Inpatient Hospital Stay: Payer: Medicaid Other | Admitting: Family Medicine

## 2019-08-16 ENCOUNTER — Encounter: Payer: Medicaid Other | Admitting: Gastroenterology

## 2019-08-25 MED FILL — NOVOLOG FLEXPEN SYRINGE: 100 | 33 days supply | Qty: 15 | Fill #3

## 2019-08-25 MED FILL — ACCU-CHEK GUIDE TEST STRIP: 25 days supply | Qty: 100 | Fill #1

## 2019-09-18 ENCOUNTER — Other Ambulatory Visit: Payer: Self-pay

## 2019-09-18 ENCOUNTER — Encounter (HOSPITAL_COMMUNITY): Payer: Self-pay

## 2019-09-18 ENCOUNTER — Emergency Department (HOSPITAL_COMMUNITY)
Admission: EM | Admit: 2019-09-18 | Discharge: 2019-09-19 | Disposition: A | Discharge status: Home / Self Care | Payer: Medicaid Other | Attending: Emergency Medicine | Admitting: Emergency Medicine

## 2019-09-18 DIAGNOSIS — Z794 Long term (current) use of insulin: Secondary | ICD-10-CM | POA: Diagnosis not present

## 2019-09-18 DIAGNOSIS — F17210 Nicotine dependence, cigarettes, uncomplicated: Secondary | ICD-10-CM | POA: Diagnosis not present

## 2019-09-18 DIAGNOSIS — Z79899 Other long term (current) drug therapy: Secondary | ICD-10-CM | POA: Insufficient documentation

## 2019-09-18 DIAGNOSIS — E109 Type 1 diabetes mellitus without complications: Secondary | ICD-10-CM | POA: Diagnosis not present

## 2019-09-18 DIAGNOSIS — K3184 Gastroparesis: Secondary | ICD-10-CM | POA: Diagnosis not present

## 2019-09-18 DIAGNOSIS — R109 Unspecified abdominal pain: Secondary | ICD-10-CM | POA: Diagnosis present

## 2019-09-18 LAB — URINALYSIS, ROUTINE W REFLEX MICROSCOPIC
Bacteria, UA: NONE SEEN
Bilirubin Urine: NEGATIVE
Glucose, UA: >=500 mg/dL — AB
Hgb urine dipstick: NEGATIVE
Ketones, ur: 20 mg/dL — AB
Leukocytes,Ua: NEGATIVE
Nitrite: NEGATIVE
Protein, ur: NEGATIVE mg/dL
Specific Gravity, Urine: 1.033 — ABNORMAL HIGH (ref 1.005–1.030)
pH: 5 (ref 5.0–8.0)

## 2019-09-18 LAB — COMPREHENSIVE METABOLIC PANEL
ALT: 21 U/L (ref 0–44)
AST: 20 U/L (ref 15–41)
Albumin: 4.4 g/dL (ref 3.5–5.0)
Alkaline Phosphatase: 113 U/L (ref 38–126)
Anion gap: 13 (ref 5–15)
BUN: 11 mg/dL (ref 6–20)
CO2: 22 mmol/L (ref 22–32)
Calcium: 9.6 mg/dL (ref 8.9–10.3)
Chloride: 102 mmol/L (ref 98–111)
Creatinine, Ser: 0.91 mg/dL (ref 0.61–1.24)
GFR calc Af Amer: >60 mL/min (ref >60)
GFR calc non Af Amer: >60 mL/min (ref >60)
Glucose, Bld: 332 mg/dL — ABNORMAL HIGH (ref 70–99)
Potassium: 3.8 mmol/L (ref 3.5–5.1)
Sodium: 137 mmol/L (ref 135–145)
Total Bilirubin: 0.8 mg/dL (ref 0.3–1.2)
Total Protein: 7.9 g/dL (ref 6.5–8.1)

## 2019-09-18 LAB — CBC
HCT: 46.8 % (ref 39.0–52.0)
Hemoglobin: 14.8 g/dL (ref 13.0–17.0)
MCH: 26.4 pg (ref 26.0–34.0)
MCHC: 31.6 g/dL (ref 30.0–36.0)
MCV: 83.4 fL (ref 80.0–100.0)
Platelets: 314 10*3/uL (ref 150–400)
RBC: 5.61 MIL/uL (ref 4.22–5.81)
RDW: 13.2 % (ref 11.5–15.5)
WBC: 9.7 10*3/uL (ref 4.0–10.5)
nRBC: 0 % (ref 0.0–0.2)

## 2019-09-18 LAB — LIPASE, BLOOD: Lipase: 19 U/L (ref 11–51)

## 2019-09-18 MED ORDER — SODIUM CHLORIDE 0.9% FLUSH
3.0000 mL | Freq: Once | INTRAVENOUS | Status: AC
  Administered 2019-09-18: 3 mL via INTRAVENOUS

## 2019-09-18 MED ORDER — KETOROLAC TROMETHAMINE 30 MG/ML IJ SOLN
15.0000 mg | Freq: Once | INTRAMUSCULAR | Status: AC
  Administered 2019-09-18: 15 mg via INTRAVENOUS
  Filled 2019-09-18: qty 1

## 2019-09-18 MED ORDER — SODIUM CHLORIDE 0.9 % IV BOLUS
1000.0000 mL | Freq: Once | INTRAVENOUS | Status: AC
  Administered 2019-09-18: 1000 mL via INTRAVENOUS

## 2019-09-18 MED ORDER — FENTANYL CITRATE (PF) 100 MCG/2ML IJ SOLN
50.0000 ug | Freq: Once | INTRAMUSCULAR | Status: AC
  Administered 2019-09-18: 21:00:00 50 ug via INTRAVENOUS
  Filled 2019-09-18: qty 2

## 2019-09-18 NOTE — ED Provider Notes (Signed)
[facility] DEPT Provider Note   CSN: [id number] Arrival date & time: 09/18/19  1735     History Chief Complaint  Patient presents with  . Emesis  . Abdominal Pain    Jonathan Porter is a 28 y.o. male with a past medical history of diabetes, gastroparesis presenting to the ED with a chief complaint of abdominal pain and emesis.  Woke up this morning with generalized abdominal pain, several episodes of nonbloody, nonbilious emesis.  States that this feels similar to his gastroparesis flareups.  No improvement noted with his home Zofran.  He was evaluated by GI several weeks ago but did not follow-up after that for endoscopy.  He cannot recall any inciting event that may have triggered his symptoms.  He does endorse marijuana use but denies any other drug use or alcohol use.  Denies prior abdominal surgeries, changes to bowel movements, changes to urination, sick contacts with similar symptoms, fever, cough or shortness of breath.  HPI     Past Medical History:  Diagnosis Date  . Diabetes mellitus without complication ([place])   . Gastroparesis     Patient Active Problem List   Diagnosis Date Noted  . Intractable nausea and vomiting 07/03/2019  . Gastroparesis 07/02/2019  . Influenza vaccine refused 06/03/2019  . Erectile dysfunction 06/03/2019  . Obesity (BMI 30-39.9) 06/03/2019  . Type 1 diabetes mellitus with diabetic polyneuropathy ([place]) 01/21/2019  . Type 1 diabetes mellitus with hyperglycemia ([place]) 01/21/2019  . Paronychia of finger of left hand 01/20/2017  . 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 Diabetes type 1, uncontrolled ([place]) 08/23/2015  . Onychomycosis of toenail 08/23/2015  . Phimosis 08/07/2014    Past Surgical History:  Procedure Laterality Date  . NO PAST SURGERIES         Family History  Problem Relation Age of Onset  . Thyroid disease Mother   . Diabetes Maternal Aunt   . Diabetes Maternal  Grandmother   . Diabetes Maternal Grandfather   . Thyroid disease Other     Social History   Tobacco Use  . Smoking status: Current Every Day Smoker    Packs/day: 0.25    Years: 6.00    Pack years: 1.50    Types: Cigarettes  . Smokeless tobacco: Never Used  Substance Use Topics  . Alcohol use: Yes    Comment: social   . Drug use: Not Currently    Types: Marijuana    Comment: last used 4 days ago.    Home Medications Prior to Admission medications   Medication Sig Start Date End Date Taking? Authorizing Provider  Accu-Chek FastClix Lancets MISC Use to check blood sugar up to 3 times daily. 06/03/19  Yes Ladell Pier, MD  blood glucose meter kit and supplies KIT Dispense based on patient and insurance preference. Use up to four times daily as directed. (FOR ICD-9 250.00, 250.01). 07/06/19  Yes Quentin Cornwall, [place] N, PA-C  Blood Glucose Monitoring Suppl (ACCU-CHEK GUIDE ME) w/Device KIT 1 kit by Does not apply route 3 (three) times daily. Use to check blood sugar up to 3 times daily. 06/03/19  Yes Ladell Pier, MD  glucose blood (ACCU-CHEK GUIDE) test strip 4x a day 06/03/19  Yes Ladell Pier, MD  insulin aspart (NOVOLOG) 100 UNIT/ML FlexPen Inject 15 Units into the skin 3 (three) times daily with meals. 09/28/18  Yes Ladell Pier, MD  Insulin Glargine (LANTUS SOLOSTAR) 100 UNIT/ML Solostar Pen Inject 15  Units into the skin daily at 10 pm. 09/28/18  Yes Ladell Pier, MD  Insulin Pen Needle (B-D ULTRAFINE III SHORT PEN) 31G X 8 MM MISC 1 application by Does not apply route 4 (four) times daily. 05/21/18  Yes Ladell Pier, MD  sildenafil (VIAGRA) 50 MG tablet TAKE 1 TABLET BY MOUTH A HALF HOUR PRIOR TO SEXUAL INTERCOURSE AS NEEDED. *LIMIT TO 1 TABLET PER 24 HOURS* Patient taking differently: Take 50 mg by mouth as needed for erectile dysfunction. TAKE 1 TABLET BY MOUTH A HALF HOUR PRIOR TO SEXUAL INTERCOURSE AS NEEDED. *LIMIT TO 1 TABLET PER 24 HOURS* 06/03/19  Yes  Ladell Pier, MD  ondansetron (ZOFRAN ODT) 8 MG disintegrating tablet Take one tablet every morning before eating then repeat in 6 hours Patient not taking: Reported on 09/18/2019 07/07/19   Esterwood, Amy S, PA-C  promethazine (PHENERGAN) 25 MG tablet Take 1 tablet (25 mg total) by mouth every 6 (six) hours as needed for nausea or vomiting. Patient not taking: Reported on 07/07/2019 07/06/19   Robinson, [place] N, PA-C    Allergies    Shellfish allergy  Review of Systems   Review of Systems  Constitutional: Negative for appetite change, chills and fever.  HENT: Negative for ear pain, rhinorrhea, sneezing and sore throat.   Eyes: Negative for photophobia and visual disturbance.  Respiratory: Negative for cough, chest tightness, shortness of breath and wheezing.   Cardiovascular: Negative for chest pain and palpitations.  Gastrointestinal: Positive for abdominal pain, nausea and vomiting. Negative for blood in stool, constipation and diarrhea.  Genitourinary: Negative for dysuria, hematuria and urgency.  Musculoskeletal: Negative for myalgias.  Skin: Negative for rash.  Neurological: Negative for dizziness, weakness and light-headedness.    Physical Exam Updated Vital Signs BP (!) 115/91 (BP Location: Left Arm)   Pulse (!) 54   Temp (!) 97.5 F (36.4 C) (Oral)   Resp (!) 22   SpO2 100%   Physical Exam Vitals and nursing note reviewed.  Constitutional:      General: He is not in acute distress.    Appearance: He is well-developed.  HENT:     Head: Normocephalic and atraumatic.     Nose: Nose normal.  Eyes:     General: No scleral icterus.       Left eye: No discharge.     Conjunctiva/sclera: Conjunctivae normal.  Cardiovascular:     Rate and Rhythm: Normal rate and regular rhythm.     Heart sounds: Normal heart sounds. No murmur. No friction rub. No gallop.   Pulmonary:     Effort: Pulmonary effort is normal. No respiratory distress.     Breath sounds: Normal  breath sounds.  Abdominal:     General: Bowel sounds are normal. There is no distension.     Palpations: Abdomen is soft.     Tenderness: There is generalized abdominal tenderness. There is no guarding.  Musculoskeletal:        General: Normal range of motion.     Cervical back: Normal range of motion and neck supple.  Skin:    General: Skin is warm and dry.     Findings: No rash.  Neurological:     Mental Status: He is alert.     Motor: No abnormal muscle tone.     Coordination: Coordination normal.     ED Results / Procedures / Treatments   Labs (all labs ordered are listed, but only abnormal results are displayed) Labs Reviewed  COMPREHENSIVE METABOLIC PANEL - Abnormal; Notable for the following components:      Result Value   Glucose, Bld 332 (*)    All other components within normal limits  URINALYSIS, ROUTINE W REFLEX MICROSCOPIC - Abnormal; Notable for the following components:   Specific Gravity, Urine 1.033 (*)    Glucose, UA >=500 (*)    Ketones, ur 20 (*)    All other components within normal limits  LIPASE, BLOOD  CBC    EKG None  Radiology No results found.  Procedures Procedures (including critical care time)  Medications Ordered in ED Medications  sodium chloride flush (NS) 0.9 % injection 3 mL (3 mLs Intravenous Given 09/18/19 2035)  sodium chloride 0.9 % bolus 1,000 mL (0 mLs Intravenous Stopped 09/18/19 2154)  ketorolac (TORADOL) 30 MG/ML injection 15 mg (15 mg Intravenous Given 09/18/19 2034)  fentaNYL (SUBLIMAZE) injection 50 mcg (50 mcg Intravenous Given 09/18/19 2033)    ED Course  I have reviewed the triage vital signs and the nursing notes.  Pertinent labs & imaging results that were available during my care of the patient were reviewed by me and considered in my medical decision making (see chart for details).    MDM Rules/Calculators/A&P                      28 year old male with a past medical history of diabetes, gastroparesis  presents to the ED with a chief complaint of abdominal pain and vomiting.  States that his symptoms feel similar to gastroparesis flareups in the past.  No improvement with his home antiemetics.  On exam patient is dry heaving with generalized tenderness to palpation of the abdomen.  Patient tachypneic appears to be hyperventilating.  Other vital signs are within normal limits.  Lab work shows elevated was without other abnormalities.  Patient was given IV fluids, Toradol with significant improvement in his symptoms.  Repeat abdominal exams are benign.  He is able to tolerate PO intake without difficulty prior to discharge. Doubt appendicitis, cholecystitis or other emergent cause of symptoms.  We will have him continue his home medications and return for worsening symptoms.  Patient is hemodynamically stable, in NAD, and able to ambulate in the ED. Evaluation does not show pathology that would require ongoing emergent intervention or inpatient treatment. I explained the diagnosis to the patient. Pain has been managed and has no complaints prior to discharge. Patient is comfortable with above plan and is stable for discharge at this time. All questions were answered prior to disposition. Strict return precautions for returning to the ED were discussed. Encouraged follow up with PCP.   An After Visit Summary was printed and given to the patient.   Portions of this note were generated with [employer]. Dictation errors may occur despite best attempts at proofreading.   Final Clinical Impression(s) / ED Diagnoses Final diagnoses:  Gastroparesis    Rx / DC Orders ED Discharge Orders    None       Delia Heady, PA-C 09/18/19 2305    Sherwood Gambler, MD 09/18/19 [phone]

## 2019-09-18 NOTE — ED Notes (Signed)
Pt sleeping when I checked to see how fluid/PO challenge was going.  Pt had not drank any fluids so I encouraged him to drink and will continue to check back.

## 2019-09-18 NOTE — ED Triage Notes (Signed)
Pt states he has had abd pain for several days, but started having emesis today. Pt states 5+ episodes of emesis today. Denies diarrhea.

## 2019-09-18 NOTE — ED Notes (Signed)
Pt aware urine sample is needed but states he is unable to provide at this time.  Urinal was left at bedside.

## 2019-09-18 NOTE — Discharge Instructions (Signed)
Continue your home medications as previously prescribed. Return to the ED if you start to have worsening symptoms, develop a fever, chest pain, shortness of breath, vomiting blood.

## 2019-09-18 NOTE — ED Notes (Signed)
Provided pt with cranberry juice for fluid/po challenge.

## 2019-09-21 ENCOUNTER — Emergency Department (HOSPITAL_COMMUNITY)
Admission: EM | Admit: 2019-09-21 | Discharge: 2019-09-21 | Disposition: A | Discharge status: Home / Self Care | Payer: Medicaid Other | Attending: Emergency Medicine | Admitting: Emergency Medicine

## 2019-09-21 ENCOUNTER — Other Ambulatory Visit: Payer: Self-pay

## 2019-09-21 ENCOUNTER — Encounter (HOSPITAL_COMMUNITY): Payer: Self-pay | Admitting: Obstetrics and Gynecology

## 2019-09-21 DIAGNOSIS — F17210 Nicotine dependence, cigarettes, uncomplicated: Secondary | ICD-10-CM | POA: Insufficient documentation

## 2019-09-21 DIAGNOSIS — E109 Type 1 diabetes mellitus without complications: Secondary | ICD-10-CM | POA: Insufficient documentation

## 2019-09-21 DIAGNOSIS — R109 Unspecified abdominal pain: Secondary | ICD-10-CM | POA: Diagnosis present

## 2019-09-21 DIAGNOSIS — R112 Nausea with vomiting, unspecified: Secondary | ICD-10-CM | POA: Diagnosis not present

## 2019-09-21 DIAGNOSIS — Z794 Long term (current) use of insulin: Secondary | ICD-10-CM | POA: Diagnosis not present

## 2019-09-21 DIAGNOSIS — R1084 Generalized abdominal pain: Secondary | ICD-10-CM

## 2019-09-21 LAB — COMPREHENSIVE METABOLIC PANEL
ALT: 24 U/L (ref 0–44)
AST: 26 U/L (ref 15–41)
Albumin: 4.3 g/dL (ref 3.5–5.0)
Alkaline Phosphatase: 112 U/L (ref 38–126)
Anion gap: 13 (ref 5–15)
BUN: 15 mg/dL (ref 6–20)
CO2: 20 mmol/L — ABNORMAL LOW (ref 22–32)
Calcium: 9.4 mg/dL (ref 8.9–10.3)
Chloride: 104 mmol/L (ref 98–111)
Creatinine, Ser: 1.19 mg/dL (ref 0.61–1.24)
GFR calc Af Amer: >60 mL/min (ref >60)
GFR calc non Af Amer: >60 mL/min (ref >60)
Glucose, Bld: 234 mg/dL — ABNORMAL HIGH (ref 70–99)
Potassium: 4.5 mmol/L (ref 3.5–5.1)
Sodium: 137 mmol/L (ref 135–145)
Total Bilirubin: 0.5 mg/dL (ref 0.3–1.2)
Total Protein: 7.9 g/dL (ref 6.5–8.1)

## 2019-09-21 LAB — CBC
HCT: 47.1 % (ref 39.0–52.0)
Hemoglobin: 14.9 g/dL (ref 13.0–17.0)
MCH: 26.3 pg (ref 26.0–34.0)
MCHC: 31.6 g/dL (ref 30.0–36.0)
MCV: 83.1 fL (ref 80.0–100.0)
Platelets: 353 10*3/uL (ref 150–400)
RBC: 5.67 MIL/uL (ref 4.22–5.81)
RDW: 13.3 % (ref 11.5–15.5)
WBC: 13.7 10*3/uL — ABNORMAL HIGH (ref 4.0–10.5)
nRBC: 0 % (ref 0.0–0.2)

## 2019-09-21 LAB — LIPASE, BLOOD: Lipase: 17 U/L (ref 11–51)

## 2019-09-21 LAB — CBG MONITORING, ED: Glucose-Capillary: 191 mg/dL — ABNORMAL HIGH (ref 70–99)

## 2019-09-21 MED ORDER — LORAZEPAM 2 MG/ML IJ SOLN
1.0000 mg | Freq: Once | INTRAMUSCULAR | Status: AC
  Administered 2019-09-21: 1 mg via INTRAVENOUS
  Filled 2019-09-21: qty 1

## 2019-09-21 MED ORDER — SODIUM CHLORIDE 0.9 % IV BOLUS
1000.0000 mL | Freq: Once | INTRAVENOUS | Status: AC
  Administered 2019-09-21: 1000 mL via INTRAVENOUS

## 2019-09-21 MED ORDER — HALOPERIDOL LACTATE 5 MG/ML IJ SOLN
5.0000 mg | Freq: Once | INTRAMUSCULAR | Status: AC
  Administered 2019-09-21: 5 mg via INTRAVENOUS
  Filled 2019-09-21: qty 1

## 2019-09-21 MED ORDER — SODIUM CHLORIDE 0.9% FLUSH
3.0000 mL | Freq: Once | INTRAVENOUS | Status: AC
  Administered 2019-09-21: 11:00:00 3 mL via INTRAVENOUS

## 2019-09-21 NOTE — ED Triage Notes (Signed)
Pt reports emesis and abdominal pain. Patient reports he has diabetes and believes he has gastroparesis.

## 2019-09-21 NOTE — ED Provider Notes (Signed)
[facility] DEPT Provider Note   CSN: [id number] Arrival date & time: 09/21/19  1008     History Chief Complaint  Patient presents with  . Emesis    Jonathan Porter is a 28 y.o. male.  The history is provided by the patient and medical records. No language interpreter was used.      28 year old male with history of type 1 diabetes, gastroparesis, obesity presenting for evaluation of abdominal pain.  Patient reports since this morning he developed diffuse abdominal pain described as sharp, 10 out of 10, persistent, with associated nausea, vomiting nonbloody nonbilious contents including sweaty.  Symptom is moderate to severe without any relief.  No specific treatment tried.  He endorsed a mild loose stools.  He does not complain of any fever, headache, chest pain, shortness of breath, productive cough or any COVID-19 symptoms.  He does admits to using marijuana on a regular basis last use was yesterday.  He admits that his diabetes is not well controlled.  Denies any recent alcohol use.    Past Medical History:  Diagnosis Date  . Diabetes mellitus without complication ([place])   . Gastroparesis     Patient Active Problem List   Diagnosis Date Noted  . Intractable nausea and vomiting 07/03/2019  . Gastroparesis 07/02/2019  . Influenza vaccine refused 06/03/2019  . Erectile dysfunction 06/03/2019  . Obesity (BMI 30-39.9) 06/03/2019  . Type 1 diabetes mellitus with diabetic polyneuropathy ([place]) 01/21/2019  . Type 1 diabetes mellitus with hyperglycemia ([place]) 01/21/2019  . Paronychia of finger of left hand 01/20/2017  . 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 Diabetes type 1, uncontrolled ([place]) 08/23/2015  . Onychomycosis of toenail 08/23/2015  . Phimosis 08/07/2014    Past Surgical History:  Procedure Laterality Date  . NO PAST SURGERIES         Family History  Problem Relation Age of Onset  . Thyroid disease Mother     . Diabetes Maternal Aunt   . Diabetes Maternal Grandmother   . Diabetes Maternal Grandfather   . Thyroid disease Other     Social History   Tobacco Use  . Smoking status: Current Every Day Smoker    Packs/day: 0.25    Years: 6.00    Pack years: 1.50    Types: Cigarettes  . Smokeless tobacco: Never Used  Substance Use Topics  . Alcohol use: Yes    Comment: social   . Drug use: Yes    Types: Marijuana    Comment: last used 4 days ago.    Home Medications Prior to Admission medications   Medication Sig Start Date End Date Taking? Authorizing Provider  Accu-Chek FastClix Lancets MISC Use to check blood sugar up to 3 times daily. 06/03/19   Ladell Pier, MD  blood glucose meter kit and supplies KIT Dispense based on patient and insurance preference. Use up to four times daily as directed. (FOR ICD-9 250.00, 250.01). 07/06/19   Robinson, [place] N, PA-C  Blood Glucose Monitoring Suppl (ACCU-CHEK GUIDE ME) w/Device KIT 1 kit by Does not apply route 3 (three) times daily. Use to check blood sugar up to 3 times daily. 06/03/19   Ladell Pier, MD  glucose blood (ACCU-CHEK GUIDE) test strip 4x a day 06/03/19   Ladell Pier, MD  insulin aspart (NOVOLOG) 100 UNIT/ML FlexPen Inject 15 Units into the skin 3 (three) times daily with meals. 09/28/18   Ladell Pier, MD  Insulin Glargine (  LANTUS SOLOSTAR) 100 UNIT/ML Solostar Pen Inject 15 Units into the skin daily at 10 pm. 09/28/18   Ladell Pier, MD  Insulin Pen Needle (B-D ULTRAFINE III SHORT PEN) 31G X 8 MM MISC 1 application by Does not apply route 4 (four) times daily. 05/21/18   Ladell Pier, MD  ondansetron (ZOFRAN ODT) 8 MG disintegrating tablet Take one tablet every morning before eating then repeat in 6 hours Patient not taking: Reported on 09/18/2019 07/07/19   Esterwood, Amy S, PA-C  promethazine (PHENERGAN) 25 MG tablet Take 1 tablet (25 mg total) by mouth every 6 (six) hours as needed for nausea or  vomiting. Patient not taking: Reported on 07/07/2019 07/06/19   Robinson, [place] N, PA-C  sildenafil (VIAGRA) 50 MG tablet TAKE 1 TABLET BY MOUTH A HALF HOUR PRIOR TO SEXUAL INTERCOURSE AS NEEDED. *LIMIT TO 1 TABLET PER 24 HOURS* Patient taking differently: Take 50 mg by mouth as needed for erectile dysfunction. TAKE 1 TABLET BY MOUTH A HALF HOUR PRIOR TO SEXUAL INTERCOURSE AS NEEDED. *LIMIT TO 1 TABLET PER 24 HOURS* 06/03/19   Ladell Pier, MD    Allergies    Shellfish allergy  Review of Systems   Review of Systems  All other systems reviewed and are negative.   Physical Exam Updated Vital Signs BP (!) 156/123 (BP Location: Right Arm)   Pulse 75   Temp 98.8 F (37.1 C) (Oral)   Resp (!) 21   Ht 5' 11.75" (1.822 m)   Wt 98.8 kg   SpO2 100%   BMI 29.75 kg/m   Physical Exam Vitals and nursing note reviewed.  Constitutional:      Appearance: He is well-developed.     Comments: Appears uncomfortable, writhing in bed.   HENT:     Head: Atraumatic.  Eyes:     Conjunctiva/sclera: Conjunctivae normal.  Cardiovascular:     Rate and Rhythm: Normal rate and regular rhythm.     Pulses: Normal pulses.     Heart sounds: Normal heart sounds.  Pulmonary:     Effort: Pulmonary effort is normal.     Breath sounds: Normal breath sounds.  Abdominal:     Palpations: Abdomen is soft.     Tenderness: There is no abdominal tenderness.  Musculoskeletal:     Cervical back: Neck supple.  Skin:    Findings: No rash.  Neurological:     Mental Status: He is alert and oriented to person, place, and time.     ED Results / Procedures / Treatments   Labs (all labs ordered are listed, but only abnormal results are displayed) Labs Reviewed  COMPREHENSIVE METABOLIC PANEL - Abnormal; Notable for the following components:      Result Value   CO2 20 (*)    Glucose, Bld 234 (*)    All other components within normal limits  CBC - Abnormal; Notable for the following components:   WBC 13.7  (*)    All other components within normal limits  CBG MONITORING, ED - Abnormal; Notable for the following components:   Glucose-Capillary 191 (*)    All other components within normal limits  LIPASE, BLOOD  RAPID URINE DRUG SCREEN, HOSP PERFORMED    EKG None  ED ECG REPORT   Date: 09/21/2019  Rate: 70  Rhythm: normal sinus rhythm  QRS Axis: normal  Intervals: PR shortened  ST/T Wave abnormalities: normal  Conduction Disutrbances:none  Narrative Interpretation:   Old EKG Reviewed: unchanged  I have personally  reviewed the EKG tracing and agree with the computerized printout as noted.   Radiology No results found.  Procedures Procedures (including critical care time)  Medications Ordered in ED Medications  sodium chloride flush (NS) 0.9 % injection 3 mL (has no administration in time range)    ED Course  I have reviewed the triage vital signs and the nursing notes.  Pertinent labs & imaging results that were available during my care of the patient were reviewed by me and considered in my medical decision making (see chart for details).    MDM Rules/Calculators/A&P                      BP (!) 156/123 (BP Location: Right Arm)   Pulse 75   Temp 98.8 F (37.1 C) (Oral)   Resp (!) 21   Ht 5' 11.75" (1.822 m)   Wt 98.8 kg   SpO2 100%   BMI 29.75 kg/m   Final Clinical Impression(s) / ED Diagnoses Final diagnoses:  Intractable vomiting with nausea  Generalized abdominal pain    Rx / DC Orders ED Discharge Orders    None     11:16 AM This is a type I diabetic patient with history of gastroparesis here with complaints of nausea vomiting and abdominal discomfort.  On exam, his abdomen is soft and nontender however patient appears very uncomfortable, diaphoretic, actively retching.  Does have history of marijuana use which may increase risk of cannabinoid hyperemesis syndrome.  Will provide symptomatic treatment, will monitor closely.  2:07 PM Mild elevated  WBC of 13.7 likely stress reaction.  Elevated CBG of 234 with normal anion gap.  Normal lipase.  Repeat CBG is 191.  Given symptomatic treatment and after close monitoring, he felt better, tolerates p.o.  I encourage patient to avoid marijuana use as it may induce his current symptoms.  I have low suspicion for acute abdominal pathology.  Patient otherwise stable for discharge.  Return precaution discussed.   Domenic Moras, PA-C 09/21/19 1409    Tegeler, Gwenyth Allegra, MD 09/21/19 [phone]

## 2019-09-25 ENCOUNTER — Encounter (HOSPITAL_COMMUNITY): Payer: Self-pay | Admitting: Emergency Medicine

## 2019-09-25 ENCOUNTER — Emergency Department (HOSPITAL_COMMUNITY)
Admission: EM | Admit: 2019-09-25 | Discharge: 2019-09-26 | Disposition: A | Discharge status: Home / Self Care | Payer: Medicaid Other | Attending: Emergency Medicine | Admitting: Emergency Medicine

## 2019-09-25 ENCOUNTER — Other Ambulatory Visit: Payer: Self-pay

## 2019-09-25 DIAGNOSIS — G8929 Other chronic pain: Secondary | ICD-10-CM | POA: Insufficient documentation

## 2019-09-25 DIAGNOSIS — Z79899 Other long term (current) drug therapy: Secondary | ICD-10-CM | POA: Diagnosis not present

## 2019-09-25 DIAGNOSIS — Z794 Long term (current) use of insulin: Secondary | ICD-10-CM | POA: Insufficient documentation

## 2019-09-25 DIAGNOSIS — F17210 Nicotine dependence, cigarettes, uncomplicated: Secondary | ICD-10-CM | POA: Insufficient documentation

## 2019-09-25 DIAGNOSIS — R109 Unspecified abdominal pain: Secondary | ICD-10-CM | POA: Diagnosis not present

## 2019-09-25 DIAGNOSIS — E1042 Type 1 diabetes mellitus with diabetic polyneuropathy: Secondary | ICD-10-CM | POA: Diagnosis not present

## 2019-09-25 DIAGNOSIS — R112 Nausea with vomiting, unspecified: Secondary | ICD-10-CM | POA: Diagnosis present

## 2019-09-25 LAB — CBC
HCT: 46.2 % (ref 39.0–52.0)
Hemoglobin: 14.9 g/dL (ref 13.0–17.0)
MCH: 26.7 pg (ref 26.0–34.0)
MCHC: 32.3 g/dL (ref 30.0–36.0)
MCV: 82.8 fL (ref 80.0–100.0)
Platelets: 308 10*3/uL (ref 150–400)
RBC: 5.58 MIL/uL (ref 4.22–5.81)
RDW: 13.2 % (ref 11.5–15.5)
WBC: 8.3 10*3/uL (ref 4.0–10.5)
nRBC: 0 % (ref 0.0–0.2)

## 2019-09-25 LAB — COMPREHENSIVE METABOLIC PANEL
ALT: 17 U/L (ref 0–44)
AST: 17 U/L (ref 15–41)
Albumin: 4.4 g/dL (ref 3.5–5.0)
Alkaline Phosphatase: 114 U/L (ref 38–126)
Anion gap: 8 (ref 5–15)
BUN: 11 mg/dL (ref 6–20)
CO2: 27 mmol/L (ref 22–32)
Calcium: 9.8 mg/dL (ref 8.9–10.3)
Chloride: 105 mmol/L (ref 98–111)
Creatinine, Ser: 1.02 mg/dL (ref 0.61–1.24)
GFR calc Af Amer: >60 mL/min (ref >60)
GFR calc non Af Amer: >60 mL/min (ref >60)
Glucose, Bld: 193 mg/dL — ABNORMAL HIGH (ref 70–99)
Potassium: 3.9 mmol/L (ref 3.5–5.1)
Sodium: 140 mmol/L (ref 135–145)
Total Bilirubin: 0.5 mg/dL (ref 0.3–1.2)
Total Protein: 8.2 g/dL — ABNORMAL HIGH (ref 6.5–8.1)

## 2019-09-25 LAB — LIPASE, BLOOD: Lipase: 19 U/L (ref 11–51)

## 2019-09-25 MED ORDER — DICYCLOMINE HCL 10 MG/ML IM SOLN
10.0000 mg | Freq: Once | INTRAMUSCULAR | Status: AC
  Administered 2019-09-25: 10 mg via INTRAMUSCULAR
  Filled 2019-09-25: qty 2

## 2019-09-25 MED ORDER — KETOROLAC TROMETHAMINE 30 MG/ML IJ SOLN
30.0000 mg | Freq: Once | INTRAMUSCULAR | Status: AC
  Administered 2019-09-25: 30 mg via INTRAVENOUS
  Filled 2019-09-25: qty 1

## 2019-09-25 MED ORDER — METOCLOPRAMIDE HCL 5 MG/ML IJ SOLN
10.0000 mg | Freq: Once | INTRAMUSCULAR | Status: AC
  Administered 2019-09-25: 10 mg via INTRAVENOUS
  Filled 2019-09-25: qty 2

## 2019-09-25 NOTE — ED Triage Notes (Signed)
Patient left and went home-patient called 911 with the same complaints

## 2019-09-25 NOTE — ED Triage Notes (Signed)
States he has been vomiting, states he has DM-states he keeps coming in and getting admitted but then comes right back in-patient did not check his sugar today and has not followed up with endo in months

## 2019-09-25 NOTE — ED Provider Notes (Signed)
[facility] DEPT Provider Note   CSN: [id number] Arrival date & time: 09/25/19  1650     History Chief Complaint  Patient presents with  . Emesis    Jonathan Porter is a 29 y.o. male.   29 y/o male with hx of DM and gastroparesis vs cannabis hyperemesis presents to the ED c/o acute on chronic abdominal pain. Pain is sharp and in his epigastrium, is intermittent and waxes and wanes in severity. He has been using Zofran without relief of pain. Antiemetics not controlling emesis. He reports TNTC episodes of vomiting today. He continues to pass flatus. No fevers, hematemesis, melena, hematochezia. Continues to use marijuana, but states he has "cut back". On chart review, negative gastric emptying study in October. Last saw [facility] in October 2020 as well. Has neglected to continue follow up with them outpatient.  The history is provided by the patient. No language interpreter was used.  Emesis      Past Medical History:  Diagnosis Date  . Diabetes mellitus without complication ([place])   . Gastroparesis     Patient Active Problem List   Diagnosis Date Noted  . Intractable nausea and vomiting 07/03/2019  . Gastroparesis 07/02/2019  . Influenza vaccine refused 06/03/2019  . Erectile dysfunction 06/03/2019  . Obesity (BMI 30-39.9) 06/03/2019  . Type 1 diabetes mellitus with diabetic polyneuropathy ([place]) 01/21/2019  . Type 1 diabetes mellitus with hyperglycemia ([place]) 01/21/2019  . Paronychia of finger of left hand 01/20/2017  . 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 Diabetes type 1, uncontrolled ([place]) 08/23/2015  . Onychomycosis of toenail 08/23/2015  . Phimosis 08/07/2014    Past Surgical History:  Procedure Laterality Date  . NO PAST SURGERIES         Family History  Problem Relation Age of Onset  . Thyroid disease Mother   . Diabetes Maternal Aunt   . Diabetes Maternal Grandmother   . Diabetes Maternal  Grandfather   . Thyroid disease Other     Social History   Tobacco Use  . Smoking status: Current Every Day Smoker    Packs/day: 0.25    Years: 6.00    Pack years: 1.50    Types: Cigarettes  . Smokeless tobacco: Never Used  Substance Use Topics  . Alcohol use: Yes    Comment: social   . Drug use: Yes    Types: Marijuana    Comment: last used 4 days ago.    Home Medications Prior to Admission medications   Medication Sig Start Date End Date Taking? Authorizing Provider  Accu-Chek FastClix Lancets MISC Use to check blood sugar up to 3 times daily. 06/03/19  Yes Ladell Pier, MD  blood glucose meter kit and supplies KIT Dispense based on patient and insurance preference. Use up to four times daily as directed. (FOR ICD-9 250.00, 250.01). 07/06/19  Yes Quentin Cornwall, [place] N, PA-C  Blood Glucose Monitoring Suppl (ACCU-CHEK GUIDE ME) w/Device KIT 1 kit by Does not apply route 3 (three) times daily. Use to check blood sugar up to 3 times daily. 06/03/19  Yes Ladell Pier, MD  glucose blood (ACCU-CHEK GUIDE) test strip 4x a day 06/03/19  Yes Ladell Pier, MD  insulin aspart (NOVOLOG) 100 UNIT/ML FlexPen Inject 15 Units into the skin 3 (three) times daily with meals. 09/28/18  Yes Ladell Pier, MD  Insulin Glargine (LANTUS SOLOSTAR) 100 UNIT/ML Solostar Pen Inject 15 Units into the skin daily at 10  pm. 09/28/18  Yes Ladell Pier, MD  Insulin Pen Needle (B-D ULTRAFINE III SHORT PEN) 31G X 8 MM MISC 1 application by Does not apply route 4 (four) times daily. 05/21/18  Yes Ladell Pier, MD  sildenafil (VIAGRA) 50 MG tablet TAKE 1 TABLET BY MOUTH A HALF HOUR PRIOR TO SEXUAL INTERCOURSE AS NEEDED. *LIMIT TO 1 TABLET PER 24 HOURS* Patient taking differently: Take 50 mg by mouth as needed for erectile dysfunction. TAKE 1 TABLET BY MOUTH A HALF HOUR PRIOR TO SEXUAL INTERCOURSE AS NEEDED. *LIMIT TO 1 TABLET PER 24 HOURS* 06/03/19  Yes Ladell Pier, MD  dicyclomine  (BENTYL) 20 MG tablet Take 1 tablet (20 mg total) by mouth every 12 (twelve) hours as needed (for abdominal pain/cramping). 09/26/19   Antonietta Breach, PA-C  promethazine (PHENERGAN) 25 MG tablet Take 1 tablet (25 mg total) by mouth every 6 (six) hours as needed for nausea or vomiting. 09/26/19   Antonietta Breach, PA-C    Allergies    Shellfish allergy  Review of Systems   Review of Systems  Gastrointestinal: Positive for vomiting.  Ten systems reviewed and are negative for acute change, except as noted in the HPI.    Physical Exam Updated Vital Signs BP 127/85   Pulse 84   Temp 98.4 F (36.9 C) (Oral)   Resp 14   SpO2 99%   Physical Exam Vitals and nursing note reviewed.  Constitutional:      General: He is not in acute distress.    Appearance: He is well-developed. He is not diaphoretic.     Comments: Nontoxic appearing and in NAD  HENT:     Head: Normocephalic and atraumatic.  Eyes:     General: No scleral icterus.    Conjunctiva/sclera: Conjunctivae normal.  Pulmonary:     Effort: Pulmonary effort is normal. No respiratory distress.     Comments: Respirations even and unlabored Abdominal:     Palpations: Abdomen is soft. There is no mass.     Tenderness: There is no guarding.     Comments: Soft, nondistended abdomen. Reports pain in the epigastrium, but nontender to palpation on exam.  Musculoskeletal:        General: Normal range of motion.     Cervical back: Normal range of motion.  Skin:    General: Skin is warm and dry.     Coloration: Skin is not pale.     Findings: No erythema or rash.  Neurological:     Mental Status: He is alert and oriented to person, place, and time.     Coordination: Coordination normal.  Psychiatric:        Behavior: Behavior normal.     ED Results / Procedures / Treatments   Labs (all labs ordered are listed, but only abnormal results are displayed) Labs Reviewed  COMPREHENSIVE METABOLIC PANEL - Abnormal; Notable for the following  components:      Result Value   Glucose, Bld 193 (*)    Total Protein 8.2 (*)    All other components within normal limits  URINALYSIS, ROUTINE W REFLEX MICROSCOPIC - Abnormal; Notable for the following components:   APPearance HAZY (*)    Specific Gravity, Urine 1.033 (*)    Glucose, UA 150 (*)    Ketones, ur 80 (*)    Protein, ur 30 (*)    All other components within normal limits  LIPASE, BLOOD  CBC    EKG None  Radiology No results found.  Procedures Procedures (including critical care time)  Medications Ordered in ED Medications  ketorolac (TORADOL) 30 MG/ML injection 30 mg (30 mg Intravenous Given 09/25/19 2349)  metoCLOPramide (REGLAN) injection 10 mg (10 mg Intravenous Given 09/25/19 2348)  dicyclomine (BENTYL) injection 10 mg (10 mg Intramuscular Given 09/25/19 2351)  haloperidol lactate (HALDOL) injection 2 mg (2 mg Intravenous Given 09/26/19 0148)  sodium chloride 0.9 % bolus 1,000 mL (1,000 mLs Intravenous New Bag/Given 09/26/19 0149)  pantoprazole (PROTONIX) injection 40 mg (40 mg Intravenous Given 09/26/19 0326)    ED Course  I have reviewed the triage vital signs and the nursing notes.  Pertinent labs & imaging results that were available during my care of the patient were reviewed by me and considered in my medical decision making (see chart for details).  12:09 AM Patient requesting something to drink. Will PO challenge.  4:16 AM Now tolerating PO fluids after additional medications. UA c/w baseline. VSS.     MDM Rules/Calculators/A&P                       29 year old male with a history of diabetes presents to the emergency department for evaluation of ongoing epigastric abdominal pain.  He reports associated nausea and vomiting.  Has been seen in the emergency department previously for similar complaints.  On chart review, was evaluated x11 in the ED in October; subsequently followed up with GI, but has since been lost to follow up.  Negative CT and gastric  emptying study 2 months ago.  Clinically, patient appears well.  He has a fairly benign abdominal exam.  His laboratory evaluation today is reassuring and consistent with baseline.  There is no leukocytosis or significant electrolyte derangements.  Liver and kidney function preserved.  Lipase normal.  The patient has remained hemodynamically stable since arrival.  Symptoms managed with IV fluids, IV antiemetics.  He received a dose of Toradol for pain.  Narcotics withheld given questionable history of gastroparesis.  There is also underlying concern for possible cannabis hyperemesis.  While he has cut back on marijuana use, he does continue to smoke on occasion.  Have instructed the patient to follow-up with gastroenterology for management of ongoing symptoms.  Will discharge with a course of Phenergan and Bentyl as the patient is presently tolerating PO.  Return precautions discussed and provided. Patient discharged in stable condition with no unaddressed concerns.   Final Clinical Impression(s) / ED Diagnoses Final diagnoses:  Chronic abdominal pain    Rx / DC Orders ED Discharge Orders         Ordered    dicyclomine (BENTYL) 20 MG tablet  Every 12 hours PRN     09/26/19 0418    promethazine (PHENERGAN) 25 MG tablet  Every 6 hours PRN     09/26/19 0418           Antonietta Breach, PA-C 09/26/19 0423    Ward, Delice Bison, DO 09/26/19 [phone]

## 2019-09-26 LAB — URINALYSIS, ROUTINE W REFLEX MICROSCOPIC
Bacteria, UA: NONE SEEN
Bilirubin Urine: NEGATIVE
Glucose, UA: 150 mg/dL — AB
Hgb urine dipstick: NEGATIVE
Ketones, ur: 80 mg/dL — AB
Leukocytes,Ua: NEGATIVE
Nitrite: NEGATIVE
Protein, ur: 30 mg/dL — AB
Specific Gravity, Urine: 1.033 — ABNORMAL HIGH (ref 1.005–1.030)
pH: 5 (ref 5.0–8.0)

## 2019-09-26 MED ORDER — PROMETHAZINE HCL 25 MG PO TABS: 25.0000 mg | ORAL_TABLET | Freq: Four times a day (QID) | ORAL | 0 refills | Status: DC | PRN

## 2019-09-26 MED ORDER — SODIUM CHLORIDE 0.9 % IV BOLUS
1000.0000 mL | Freq: Once | INTRAVENOUS | Status: AC
  Administered 2019-09-26: 02:00:00 1000 mL via INTRAVENOUS

## 2019-09-26 MED ORDER — PANTOPRAZOLE SODIUM 40 MG IV SOLR
40.0000 mg | Freq: Once | INTRAVENOUS | Status: AC
  Administered 2019-09-26: 40 mg via INTRAVENOUS
  Filled 2019-09-26: qty 40

## 2019-09-26 MED ORDER — HALOPERIDOL LACTATE 5 MG/ML IJ SOLN
2.0000 mg | Freq: Once | INTRAMUSCULAR | Status: AC
  Administered 2019-09-26: 02:00:00 2 mg via INTRAVENOUS
  Filled 2019-09-26: qty 1

## 2019-09-26 MED ORDER — DICYCLOMINE HCL 20 MG PO TABS: 20.0000 mg | ORAL_TABLET | Freq: Two times a day (BID) | ORAL | 0 refills | Status: DC | PRN

## 2019-09-26 NOTE — ED Notes (Signed)
Pt provided water for fluid challenge 

## 2019-09-26 NOTE — Discharge Instructions (Addendum)
Your work-up in the emergency department today was reassuring.  We advise continued follow-up with your gastroenterologist as well as your primary care doctor.  You have been given a prescription for Bentyl to use for abdominal pain.  Take Phenergan as prescribed for management of nausea.  You may return for any new or concerning symptoms.

## 2019-09-26 NOTE — ED Notes (Signed)
Pt had episode of emesis following the PO challenge.

## 2019-09-26 NOTE — ED Notes (Signed)
PT able to tolerate water at this time.

## 2019-09-27 ENCOUNTER — Encounter (HOSPITAL_COMMUNITY): Payer: Self-pay | Admitting: Emergency Medicine

## 2019-09-27 ENCOUNTER — Other Ambulatory Visit: Payer: Self-pay

## 2019-09-27 ENCOUNTER — Emergency Department (HOSPITAL_COMMUNITY)
Admission: EM | Admit: 2019-09-27 | Discharge: 2019-09-28 | Disposition: A | Discharge status: Home / Self Care | Payer: Medicaid Other | Source: Home / Self Care | Attending: Emergency Medicine | Admitting: Emergency Medicine

## 2019-09-27 ENCOUNTER — Encounter (HOSPITAL_COMMUNITY): Payer: Self-pay | Admitting: *Deleted

## 2019-09-27 ENCOUNTER — Emergency Department (HOSPITAL_COMMUNITY)
Admission: EM | Admit: 2019-09-27 | Discharge: 2019-09-27 | Disposition: A | Discharge status: Home / Self Care | Payer: Medicaid Other | Attending: Emergency Medicine | Admitting: Emergency Medicine

## 2019-09-27 DIAGNOSIS — Z79899 Other long term (current) drug therapy: Secondary | ICD-10-CM | POA: Insufficient documentation

## 2019-09-27 DIAGNOSIS — G8929 Other chronic pain: Secondary | ICD-10-CM | POA: Diagnosis not present

## 2019-09-27 DIAGNOSIS — R1013 Epigastric pain: Secondary | ICD-10-CM | POA: Insufficient documentation

## 2019-09-27 DIAGNOSIS — E109 Type 1 diabetes mellitus without complications: Secondary | ICD-10-CM | POA: Insufficient documentation

## 2019-09-27 DIAGNOSIS — F17210 Nicotine dependence, cigarettes, uncomplicated: Secondary | ICD-10-CM | POA: Insufficient documentation

## 2019-09-27 DIAGNOSIS — F1290 Cannabis use, unspecified, uncomplicated: Secondary | ICD-10-CM | POA: Diagnosis not present

## 2019-09-27 DIAGNOSIS — Z5321 Procedure and treatment not carried out due to patient leaving prior to being seen by health care provider: Secondary | ICD-10-CM | POA: Insufficient documentation

## 2019-09-27 DIAGNOSIS — R110 Nausea: Secondary | ICD-10-CM | POA: Insufficient documentation

## 2019-09-27 DIAGNOSIS — R109 Unspecified abdominal pain: Secondary | ICD-10-CM | POA: Diagnosis present

## 2019-09-27 LAB — COMPREHENSIVE METABOLIC PANEL
ALT: 17 U/L (ref 0–44)
AST: 17 U/L (ref 15–41)
Albumin: 4 g/dL (ref 3.5–5.0)
Alkaline Phosphatase: 106 U/L (ref 38–126)
Anion gap: 14 (ref 5–15)
BUN: 8 mg/dL (ref 6–20)
CO2: 23 mmol/L (ref 22–32)
Calcium: 9.6 mg/dL (ref 8.9–10.3)
Chloride: 102 mmol/L (ref 98–111)
Creatinine, Ser: 1.09 mg/dL (ref 0.61–1.24)
GFR calc Af Amer: >60 mL/min (ref >60)
GFR calc non Af Amer: >60 mL/min (ref >60)
Glucose, Bld: 269 mg/dL — ABNORMAL HIGH (ref 70–99)
Potassium: 3.7 mmol/L (ref 3.5–5.1)
Sodium: 139 mmol/L (ref 135–145)
Total Bilirubin: 0.7 mg/dL (ref 0.3–1.2)
Total Protein: 7.5 g/dL (ref 6.5–8.1)

## 2019-09-27 LAB — CBC
HCT: 42.5 % (ref 39.0–52.0)
Hemoglobin: 13.9 g/dL (ref 13.0–17.0)
MCH: 26.5 pg (ref 26.0–34.0)
MCHC: 32.7 g/dL (ref 30.0–36.0)
MCV: 81.1 fL (ref 80.0–100.0)
Platelets: 326 10*3/uL (ref 150–400)
RBC: 5.24 MIL/uL (ref 4.22–5.81)
RDW: 13.2 % (ref 11.5–15.5)
WBC: 8 10*3/uL (ref 4.0–10.5)
nRBC: 0 % (ref 0.0–0.2)

## 2019-09-27 LAB — LIPASE, BLOOD: Lipase: 16 U/L (ref 11–51)

## 2019-09-27 NOTE — ED Notes (Signed)
Jonathan Porter, [facility] reports she received a call to take patient out of the [facility] system since patient is at [facility].

## 2019-09-27 NOTE — ED Triage Notes (Signed)
Pt come in with Abd bubbling due to gastro pheresis. Failed to get his meds filled due to falling asleep yesterday. Seen several times in last few days for same

## 2019-09-27 NOTE — ED Notes (Signed)
Pt refused for vitals to be checked again while in waiting room

## 2019-09-27 NOTE — ED Notes (Signed)
Pt refused vitals 

## 2019-09-27 NOTE — ED Triage Notes (Signed)
Pt reports abd pain since this AM. Has promethazine and dicyclomine but denies any relief. Endorses emesis.

## 2019-09-28 LAB — URINALYSIS, ROUTINE W REFLEX MICROSCOPIC
Bacteria, UA: NONE SEEN
Bilirubin Urine: NEGATIVE
Glucose, UA: >=500 mg/dL — AB
Hgb urine dipstick: NEGATIVE
Ketones, ur: 80 mg/dL — AB
Leukocytes,Ua: NEGATIVE
Nitrite: NEGATIVE
Protein, ur: NEGATIVE mg/dL
Specific Gravity, Urine: 1.038 — ABNORMAL HIGH (ref 1.005–1.030)
pH: 6 (ref 5.0–8.0)

## 2019-09-28 MED ORDER — KETOROLAC TROMETHAMINE 30 MG/ML IJ SOLN
30.0000 mg | Freq: Once | INTRAMUSCULAR | Status: AC
  Administered 2019-09-28: 03:00:00 30 mg via INTRAVENOUS
  Filled 2019-09-28: qty 1

## 2019-09-28 MED ORDER — HALOPERIDOL LACTATE 5 MG/ML IJ SOLN
2.0000 mg | Freq: Once | INTRAMUSCULAR | Status: AC
  Administered 2019-09-28: 04:00:00 2 mg via INTRAVENOUS
  Filled 2019-09-28: qty 1

## 2019-09-28 MED ORDER — SODIUM CHLORIDE 0.9 % IV BOLUS
1000.0000 mL | Freq: Once | INTRAVENOUS | Status: AC
  Administered 2019-09-28: 04:00:00 1000 mL via INTRAVENOUS

## 2019-09-28 MED ORDER — PANTOPRAZOLE SODIUM 40 MG IV SOLR
40.0000 mg | Freq: Once | INTRAVENOUS | Status: AC
  Administered 2019-09-28: 40 mg via INTRAVENOUS
  Filled 2019-09-28: qty 40

## 2019-09-28 MED ORDER — SODIUM CHLORIDE 0.9 % IV BOLUS
1000.0000 mL | Freq: Once | INTRAVENOUS | Status: AC
  Administered 2019-09-28: 03:00:00 1000 mL via INTRAVENOUS

## 2019-09-28 MED ORDER — METOCLOPRAMIDE HCL 5 MG/ML IJ SOLN
10.0000 mg | Freq: Once | INTRAMUSCULAR | Status: AC
  Administered 2019-09-28: 03:00:00 10 mg via INTRAVENOUS
  Filled 2019-09-28: qty 2

## 2019-09-28 NOTE — ED Provider Notes (Signed)
[facility] EMERGENCY DEPARTMENT Provider Note   CSN: [id number] Arrival date & time: 09/27/19  1354     History Chief Complaint  Patient presents with  . Abdominal Pain    Jonathan Porter is a 29 y.o. male with past medical history significant for type 1 diabetes, gastroparesis presenting to emergency department today with chief complaint of abdominal pain and nausea.  Patient has acute on chronic abdominal pain, he states this episode started when he woke up this morning.  Pain is located in his epigastric area.  He describes the pain intermittent and sharp.  Pain does not radiate.  He estimates 10 episodes of non bloody non bilious emesis today.  He has been unable to tolerate p.o. intake today secondary to nausea.  He used to smoke marijuana daily, however has been attempting to cut back. He last smoked yesterday.  Patient was seen in the emergency department yesterday for the same.  He was discharged with prescriptions for Bentyl and Phenergan.  He states he has been taking these medications however has continued to have pain, nausea and emesis. He denies any fever, chills, hematemesis, melena, hematochezia, urinary symptoms.  He admits to passing flatus.  He denies abdominal surgical history.  He has followed up with  [facility] in the past, last visit was in 06/2019.  Chart review shows he had a negative gastric emptying study.  It is recommended that he have an outpatient EGD.  Patient states he has been unable to have procedure due to childcare issues.     Past Medical History:  Diagnosis Date  . Diabetes mellitus without complication ([place])   . Gastroparesis     Patient Active Problem List   Diagnosis Date Noted  . Intractable nausea and vomiting 07/03/2019  . Gastroparesis 07/02/2019  . Influenza vaccine refused 06/03/2019  . Erectile dysfunction 06/03/2019  . Obesity (BMI 30-39.9) 06/03/2019  . Type 1 diabetes mellitus with diabetic polyneuropathy ([place])  01/21/2019  . Type 1 diabetes mellitus with hyperglycemia ([place]) 01/21/2019  . Paronychia of finger of left hand 01/20/2017  . 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 Diabetes type 1, uncontrolled ([place]) 08/23/2015  . Onychomycosis of toenail 08/23/2015  . Phimosis 08/07/2014    Past Surgical History:  Procedure Laterality Date  . NO PAST SURGERIES         Family History  Problem Relation Age of Onset  . Thyroid disease Mother   . Diabetes Maternal Aunt   . Diabetes Maternal Grandmother   . Diabetes Maternal Grandfather   . Thyroid disease Other     Social History   Tobacco Use  . Smoking status: Current Every Day Smoker    Packs/day: 0.25    Years: 6.00    Pack years: 1.50    Types: Cigarettes  . Smokeless tobacco: Never Used  Substance Use Topics  . Alcohol use: Yes    Comment: social   . Drug use: Yes    Types: Marijuana    Comment: last used 4 days ago.    Home Medications Prior to Admission medications   Medication Sig Start Date End Date Taking? Authorizing Provider  Accu-Chek FastClix Lancets MISC Use to check blood sugar up to 3 times daily. 06/03/19   Ladell Pier, MD  blood glucose meter kit and supplies KIT Dispense based on patient and insurance preference. Use up to four times daily as directed. (FOR ICD-9 250.00, 250.01). 07/06/19   Robinson, [place] N,  PA-C  Blood Glucose Monitoring Suppl (ACCU-CHEK GUIDE ME) w/Device KIT 1 kit by Does not apply route 3 (three) times daily. Use to check blood sugar up to 3 times daily. 06/03/19   Ladell Pier, MD  dicyclomine (BENTYL) 20 MG tablet Take 1 tablet (20 mg total) by mouth every 12 (twelve) hours as needed (for abdominal pain/cramping). 09/26/19   Antonietta Breach, PA-C  glucose blood (ACCU-CHEK GUIDE) test strip 4x a day 06/03/19   Ladell Pier, MD  insulin aspart (NOVOLOG) 100 UNIT/ML FlexPen Inject 15 Units into the skin 3 (three) times daily with meals. 09/28/18   Ladell Pier, MD  Insulin Glargine (LANTUS SOLOSTAR) 100 UNIT/ML Solostar Pen Inject 15 Units into the skin daily at 10 pm. 09/28/18   Ladell Pier, MD  Insulin Pen Needle (B-D ULTRAFINE III SHORT PEN) 31G X 8 MM MISC 1 application by Does not apply route 4 (four) times daily. 05/21/18   Ladell Pier, MD  promethazine (PHENERGAN) 25 MG tablet Take 1 tablet (25 mg total) by mouth every 6 (six) hours as needed for nausea or vomiting. 09/26/19   Antonietta Breach, PA-C  sildenafil (VIAGRA) 50 MG tablet TAKE 1 TABLET BY MOUTH A HALF HOUR PRIOR TO SEXUAL INTERCOURSE AS NEEDED. *LIMIT TO 1 TABLET PER 24 HOURS* Patient taking differently: Take 50 mg by mouth as needed for erectile dysfunction. TAKE 1 TABLET BY MOUTH A HALF HOUR PRIOR TO SEXUAL INTERCOURSE AS NEEDED. *LIMIT TO 1 TABLET PER 24 HOURS* 06/03/19   Ladell Pier, MD    Allergies    Shellfish allergy  Review of Systems   Review of Systems  All other systems are reviewed and are negative for acute change except as noted in the HPI.   Physical Exam Updated Vital Signs BP (!) 144/92   Pulse 72   Temp 98.5 F (36.9 C) (Oral)   Resp 15   SpO2 100%   Physical Exam Vitals and nursing note reviewed.  Constitutional:      General: He is not in acute distress.    Appearance: He is not ill-appearing.  HENT:     Head: Normocephalic and atraumatic.     Right Ear: Tympanic membrane and external ear normal.     Left Ear: Tympanic membrane and external ear normal.     Nose: Nose normal.     Mouth/Throat:     Mouth: Mucous membranes are dry.     Pharynx: Oropharynx is clear.  Eyes:     General: No scleral icterus.       Right eye: No discharge.        Left eye: No discharge.     Extraocular Movements: Extraocular movements intact.     Conjunctiva/sclera: Conjunctivae normal.     Pupils: Pupils are equal, round, and reactive to light.  Neck:     Vascular: No JVD.  Cardiovascular:     Rate and Rhythm: Normal rate and regular  rhythm.     Pulses: Normal pulses.          Radial pulses are 2+ on the right side and 2+ on the left side.     Heart sounds: Normal heart sounds.  Pulmonary:     Comments: Lungs clear to auscultation in all fields. Symmetric chest rise. No wheezing, rales, or rhonchi. Abdominal:     General: Bowel sounds are normal.     Tenderness: There is no right CVA tenderness or left CVA tenderness.  Comments: Abdomen is soft, non-distended, and non-tender in all quadrants. No rigidity, no guarding. No peritoneal signs.  Musculoskeletal:        General: Normal range of motion.     Cervical back: Normal range of motion.  Skin:    General: Skin is warm and dry.     Capillary Refill: Capillary refill takes less than 2 seconds.  Neurological:     Mental Status: He is oriented to person, place, and time.     GCS: GCS eye subscore is 4. GCS verbal subscore is 5. GCS motor subscore is 6.     Comments: Fluent speech, no facial droop.  Psychiatric:        Behavior: Behavior normal.       ED Results / Procedures / Treatments   Labs (all labs ordered are listed, but only abnormal results are displayed) Labs Reviewed  COMPREHENSIVE METABOLIC PANEL - Abnormal; Notable for the following components:      Result Value   Glucose, Bld 269 (*)    All other components within normal limits  URINALYSIS, ROUTINE W REFLEX MICROSCOPIC - Abnormal; Notable for the following components:   Specific Gravity, Urine 1.038 (*)    Glucose, UA >=500 (*)    Ketones, ur 80 (*)    All other components within normal limits  LIPASE, BLOOD  CBC    EKG None  Radiology No results found.  Procedures Procedures (including critical care time)  Medications Ordered in ED Medications  sodium chloride 0.9 % bolus 1,000 mL (0 mLs Intravenous Stopped 09/28/19 0402)  metoCLOPramide (REGLAN) injection 10 mg (10 mg Intravenous Given 09/28/19 0231)  ketorolac (TORADOL) 30 MG/ML injection 30 mg (30 mg Intravenous Given 09/28/19  0231)  sodium chloride 0.9 % bolus 1,000 mL (0 mLs Intravenous Stopped 09/28/19 0442)  pantoprazole (PROTONIX) injection 40 mg (40 mg Intravenous Given 09/28/19 0348)  haloperidol lactate (HALDOL) injection 2 mg (2 mg Intravenous Given 09/28/19 0348)    ED Course  I have reviewed the triage vital signs and the nursing notes.  Pertinent labs & imaging results that were available during my care of the patient were reviewed by me and considered in my medical decision making (see chart for details).  Vitals:   09/28/19 0255 09/28/19 0321 09/28/19 0436 09/28/19 0449  BP: (!) 131/108 (!) 144/89 140/87 140/87  Pulse: 75 82 80 71  Resp: (!) 22 14 14 12   Temp:      TempSrc:      SpO2: 100% 98% 98% 100%     MDM Rules/Calculators/A&P                     Patient has multiple ED visits with similar presentation. Today patient is very well appearing, in no acute distress,. He is afebrile, hemodynamically stable. He is presenting with epigastric pain however on my exam abdomen is benign. No tenderness, no peritoneal signs. Low suspicion for acute surgical abdomen. I viewed labs collected in triage which show hyperglycemia 269, otherwise unremarkable. Anion gap 15, at the top end of normal. Specifically no leukocytosis, no anemia, no severe electrolyte derangement, no renal insufficiencies, normal liver enzymes, normal lipase. UA without infection, however is suggestive of dehydration.  Patient given 2 L IVF as well as IV reglan, protonix, toradol, and haldol, patient does not have history of prolonged QTC, previous EKGs reviewed. On reassessment he reports pain has resolved and is tolerating PO intake. Serial abdominal exams are benign. Engaged in shared decision making  and patient feels he can manage his symptoms at home. We discussed at length the important of following up with outpatient GI. Recommend he continue phenergan and bentyl as prescribed at recent ED visit. The patient appears reasonably screened  and/or stabilized for discharge and I doubt any other medical condition or other [facility] requiring further screening, evaluation, or treatment in the ED at this time prior to discharge. The patient is safe for discharge with strict return precautions discussed.    Portions of this note were generated with [employer]. Dictation errors may occur despite best attempts at proofreading.    Final Clinical Impression(s) / ED Diagnoses Final diagnoses:  Chronic abdominal pain    Rx / DC Orders ED Discharge Orders    None       Flint Melter 49/32/41 9914    Delora Fuel, MD 44/58/48 2242

## 2019-09-28 NOTE — ED Notes (Signed)
Pt able to tolerate fluids 

## 2019-09-28 NOTE — ED Notes (Signed)
Discharge instructions discussed with pt. Pt verbalized understanding with no questions. Pt to follow up with GI.

## 2019-09-28 NOTE — ED Notes (Signed)
Pt refused vitals 

## 2019-09-28 NOTE — ED Notes (Signed)
Fluids given to pt. For fluid challenge

## 2019-09-28 NOTE — Discharge Instructions (Addendum)
You have been seen today for abdominal pain and vomiting. Please read and follow all provided instructions. Return to the emergency room for worsening condition or new concerning symptoms.    Your lab work was normal besides high blood sugar and possible dehydration   1. Medications:  Continue usual home medications Take medications as prescribed. Please review all of the medicines and only take them if you do not have an allergy to them.   2. Treatment: rest, drink plenty of fluids  3. Follow Up:  Please follow up [facility] as we discussed. I have included their information in your discharge paperwork   It is also a possibility that you have an allergic reaction to any of the medicines that you have been prescribed - Everybody reacts differently to medications and while MOST people have no trouble with most medicines, you may have a reaction such as nausea, vomiting, rash, swelling, shortness of breath. If this is the case, please stop taking the medicine immediately and contact your physician.  ?

## 2019-09-30 ENCOUNTER — Emergency Department (HOSPITAL_COMMUNITY)
Admission: EM | Admit: 2019-09-30 | Discharge: 2019-10-01 | Disposition: A | Discharge status: Home / Self Care | Payer: Medicaid Other | Source: Home / Self Care | Attending: Emergency Medicine | Admitting: Emergency Medicine

## 2019-09-30 ENCOUNTER — Emergency Department (HOSPITAL_COMMUNITY)
Admission: EM | Admit: 2019-09-30 | Discharge: 2019-09-30 | Discharge status: Against Medical Advice | Payer: Medicaid Other | Attending: Emergency Medicine | Admitting: Emergency Medicine

## 2019-09-30 ENCOUNTER — Encounter (HOSPITAL_COMMUNITY): Payer: Self-pay | Admitting: Emergency Medicine

## 2019-09-30 ENCOUNTER — Other Ambulatory Visit: Payer: Self-pay

## 2019-09-30 DIAGNOSIS — R112 Nausea with vomiting, unspecified: Secondary | ICD-10-CM

## 2019-09-30 DIAGNOSIS — R1084 Generalized abdominal pain: Secondary | ICD-10-CM | POA: Insufficient documentation

## 2019-09-30 DIAGNOSIS — F17210 Nicotine dependence, cigarettes, uncomplicated: Secondary | ICD-10-CM | POA: Insufficient documentation

## 2019-09-30 DIAGNOSIS — R1013 Epigastric pain: Secondary | ICD-10-CM | POA: Insufficient documentation

## 2019-09-30 DIAGNOSIS — E10649 Type 1 diabetes mellitus with hypoglycemia without coma: Secondary | ICD-10-CM | POA: Diagnosis not present

## 2019-09-30 DIAGNOSIS — F1290 Cannabis use, unspecified, uncomplicated: Secondary | ICD-10-CM | POA: Insufficient documentation

## 2019-09-30 DIAGNOSIS — Z794 Long term (current) use of insulin: Secondary | ICD-10-CM | POA: Insufficient documentation

## 2019-09-30 DIAGNOSIS — E162 Hypoglycemia, unspecified: Secondary | ICD-10-CM

## 2019-09-30 DIAGNOSIS — K3184 Gastroparesis: Secondary | ICD-10-CM | POA: Insufficient documentation

## 2019-09-30 DIAGNOSIS — Z79899 Other long term (current) drug therapy: Secondary | ICD-10-CM | POA: Insufficient documentation

## 2019-09-30 DIAGNOSIS — E1042 Type 1 diabetes mellitus with diabetic polyneuropathy: Secondary | ICD-10-CM | POA: Insufficient documentation

## 2019-09-30 LAB — LIPASE, BLOOD: Lipase: 18 U/L (ref 11–51)

## 2019-09-30 LAB — COMPREHENSIVE METABOLIC PANEL
ALT: 16 U/L (ref 0–44)
AST: 16 U/L (ref 15–41)
Albumin: 3.9 g/dL (ref 3.5–5.0)
Alkaline Phosphatase: 93 U/L (ref 38–126)
Anion gap: 9 (ref 5–15)
BUN: 13 mg/dL (ref 6–20)
CO2: 26 mmol/L (ref 22–32)
Calcium: 9.1 mg/dL (ref 8.9–10.3)
Chloride: 109 mmol/L (ref 98–111)
Creatinine, Ser: 0.97 mg/dL (ref 0.61–1.24)
GFR calc Af Amer: >60 mL/min (ref >60)
GFR calc non Af Amer: >60 mL/min (ref >60)
Glucose, Bld: 51 mg/dL — ABNORMAL LOW (ref 70–99)
Potassium: 3.1 mmol/L — ABNORMAL LOW (ref 3.5–5.1)
Sodium: 144 mmol/L (ref 135–145)
Total Bilirubin: 0.5 mg/dL (ref 0.3–1.2)
Total Protein: 7.3 g/dL (ref 6.5–8.1)

## 2019-09-30 LAB — URINALYSIS, ROUTINE W REFLEX MICROSCOPIC
Bilirubin Urine: NEGATIVE
Glucose, UA: NEGATIVE mg/dL
Hgb urine dipstick: NEGATIVE
Ketones, ur: NEGATIVE mg/dL
Leukocytes,Ua: NEGATIVE
Nitrite: NEGATIVE
Protein, ur: NEGATIVE mg/dL
Specific Gravity, Urine: 1.012 (ref 1.005–1.030)
pH: 7 (ref 5.0–8.0)

## 2019-09-30 LAB — CBG MONITORING, ED
Glucose-Capillary: 43 mg/dL — CL (ref 70–99)
Glucose-Capillary: 77 mg/dL (ref 70–99)
Glucose-Capillary: 69 mg/dL — ABNORMAL LOW (ref 70–99)
Glucose-Capillary: 99 mg/dL (ref 70–99)
Glucose-Capillary: 173 mg/dL — ABNORMAL HIGH (ref 70–99)

## 2019-09-30 LAB — CBC
HCT: 42.6 % (ref 39.0–52.0)
Hemoglobin: 13.7 g/dL (ref 13.0–17.0)
MCH: 27 pg (ref 26.0–34.0)
MCHC: 32.2 g/dL (ref 30.0–36.0)
MCV: 84 fL (ref 80.0–100.0)
Platelets: 327 10*3/uL (ref 150–400)
RBC: 5.07 MIL/uL (ref 4.22–5.81)
RDW: 13.4 % (ref 11.5–15.5)
WBC: 12.5 10*3/uL — ABNORMAL HIGH (ref 4.0–10.5)
nRBC: 0 % (ref 0.0–0.2)

## 2019-09-30 MED ORDER — SODIUM CHLORIDE 0.9 % IV BOLUS
2000.0000 mL | Freq: Once | INTRAVENOUS | Status: AC
  Administered 2019-09-30: 2000 mL via INTRAVENOUS

## 2019-09-30 MED ORDER — PANTOPRAZOLE SODIUM 40 MG IV SOLR
40.0000 mg | Freq: Once | INTRAVENOUS | Status: AC
  Administered 2019-09-30: 40 mg via INTRAVENOUS
  Filled 2019-09-30: qty 40

## 2019-09-30 MED ORDER — DICYCLOMINE HCL 10 MG/ML IM SOLN
10.0000 mg | Freq: Once | INTRAMUSCULAR | Status: AC
  Administered 2019-09-30: 10 mg via INTRAMUSCULAR
  Filled 2019-09-30: qty 2

## 2019-09-30 MED ORDER — FAMOTIDINE IN NACL 20-0.9 MG/50ML-% IV SOLN
20.0000 mg | Freq: Once | INTRAVENOUS | Status: AC
  Administered 2019-10-01: 20 mg via INTRAVENOUS
  Filled 2019-09-30: qty 50

## 2019-09-30 MED ORDER — POTASSIUM CHLORIDE CRYS ER 20 MEQ PO TBCR
40.0000 meq | EXTENDED_RELEASE_TABLET | Freq: Once | ORAL | Status: DC
  Filled 2019-09-30: qty 2

## 2019-09-30 MED ORDER — DEXTROSE 50 % IV SOLN: 1.0000 | Freq: Once | INTRAVENOUS | Status: DC

## 2019-09-30 MED ORDER — HALOPERIDOL LACTATE 5 MG/ML IJ SOLN
2.0000 mg | Freq: Once | INTRAMUSCULAR | Status: AC
  Administered 2019-09-30: 2 mg via INTRAVENOUS
  Filled 2019-09-30: qty 1

## 2019-09-30 MED ORDER — KETOROLAC TROMETHAMINE 15 MG/ML IJ SOLN
15.0000 mg | Freq: Once | INTRAMUSCULAR | Status: AC
  Administered 2019-09-30: 15 mg via INTRAVENOUS
  Filled 2019-09-30: qty 1

## 2019-09-30 MED ORDER — SODIUM CHLORIDE 0.9% FLUSH: 3.0000 mL | Freq: Once | INTRAVENOUS | Status: DC

## 2019-09-30 NOTE — ED Notes (Signed)
Pt ambulating around the lobby without assistance, steady gait noted, stating, "I feel like I'm going to pass out." Lyla Son, Charity fundraiser notified.

## 2019-09-30 NOTE — ED Notes (Signed)
Pt called out reporting he "feel like my blood sugar is low," This RN checked sugar, CBG 43, EDP Sammy made aware.

## 2019-09-30 NOTE — Discharge Instructions (Addendum)
You were seen in the emergency department today for nausea, vomiting, abdominal pain.  Your work-up was overall reassuring however your blood sugar was low in the 40s at one point, this improved with food intake.  Your urine had not resulted at the time you requested to leave.  Please take your At Home Phenergan and bentyl as needed as previously prescribed.  Please try to stop smoking marijuana as this could be contributing to your symptoms.  Please monitor your blood sugar very closely at home given that it was low in the emergency department today and did not significantly increase, monitor your symptoms of hypoglycemia at home. Return to the ER at anytime.   Follow up with primary care for new or worsening symptoms including but not limited to worsened pain, inability to keep fluids down, passing out, seizure activity, or any other concerns.

## 2019-09-30 NOTE — ED Triage Notes (Signed)
C/o abd pains with n/v/d that started couple hours ago. Reports last use marijuana was last night.

## 2019-09-30 NOTE — ED Provider Notes (Addendum)
[facility] DEPT Provider Note   CSN: [id number] Arrival date & time: 09/30/19  1101     History Chief Complaint  Patient presents with  . Abdominal Pain  . Emesis  . Diarrhea    Jonathan Porter is a 29 y.o. male with a hx of tobacco abuse, T1DM, gastroparesis, & marijuana use who presents to the ED with complaints of N/V & abdominal pain intermittently this week that returned this AM. Reports 4-5 episodes of emesis with 1 episode of loose stool and upper abdominal pain. No alleviating/aggravating factors. No intervention PTA. Feels similar to sxs with prior ED visits, meds given at prior ED visits he states have helped. Last smoked marijuana last night. CBG was 250 this AM- gave himself 15 unites of sliding scale insulin, forgot to take his long acting insulin last night. Denies fever, chills, chest pain, dyspnea, hematemesis, melena, hematochezia, dysuria, or testicular pain/swelling.   HPI     Past Medical History:  Diagnosis Date  . Diabetes mellitus without complication ([place])   . Gastroparesis     Patient Active Problem List   Diagnosis Date Noted  . Intractable nausea and vomiting 07/03/2019  . Gastroparesis 07/02/2019  . Influenza vaccine refused 06/03/2019  . Erectile dysfunction 06/03/2019  . Obesity (BMI 30-39.9) 06/03/2019  . Type 1 diabetes mellitus with diabetic polyneuropathy ([place]) 01/21/2019  . Type 1 diabetes mellitus with hyperglycemia ([place]) 01/21/2019  . Paronychia of finger of left hand 01/20/2017  . 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 Diabetes type 1, uncontrolled ([place]) 08/23/2015  . Onychomycosis of toenail 08/23/2015  . Phimosis 08/07/2014    Past Surgical History:  Procedure Laterality Date  . NO PAST SURGERIES         Family History  Problem Relation Age of Onset  . Thyroid disease Mother   . Diabetes Maternal Aunt   . Diabetes Maternal Grandmother   . Diabetes Maternal Grandfather     . Thyroid disease Other     Social History   Tobacco Use  . Smoking status: Current Every Day Smoker    Packs/day: 0.25    Years: 6.00    Pack years: 1.50    Types: Cigarettes  . Smokeless tobacco: Never Used  Substance Use Topics  . Alcohol use: Yes    Comment: social   . Drug use: Yes    Types: Marijuana    Home Medications Prior to Admission medications   Medication Sig Start Date End Date Taking? Authorizing Provider  Accu-Chek FastClix Lancets MISC Use to check blood sugar up to 3 times daily. 06/03/19   Ladell Pier, MD  blood glucose meter kit and supplies KIT Dispense based on patient and insurance preference. Use up to four times daily as directed. (FOR ICD-9 250.00, 250.01). 07/06/19   Robinson, [place] N, PA-C  Blood Glucose Monitoring Suppl (ACCU-CHEK GUIDE ME) w/Device KIT 1 kit by Does not apply route 3 (three) times daily. Use to check blood sugar up to 3 times daily. 06/03/19   Ladell Pier, MD  dicyclomine (BENTYL) 20 MG tablet Take 1 tablet (20 mg total) by mouth every 12 (twelve) hours as needed (for abdominal pain/cramping). 09/26/19   Antonietta Breach, PA-C  glucose blood (ACCU-CHEK GUIDE) test strip 4x a day 06/03/19   Ladell Pier, MD  insulin aspart (NOVOLOG) 100 UNIT/ML FlexPen Inject 15 Units into the skin 3 (three) times daily with meals. 09/28/18   Ladell Pier, MD  Insulin Glargine (LANTUS SOLOSTAR) 100 UNIT/ML Solostar Pen Inject 15 Units into the skin daily at 10 pm. 09/28/18   Ladell Pier, MD  Insulin Pen Needle (B-D ULTRAFINE III SHORT PEN) 31G X 8 MM MISC 1 application by Does not apply route 4 (four) times daily. 05/21/18   Ladell Pier, MD  promethazine (PHENERGAN) 25 MG tablet Take 1 tablet (25 mg total) by mouth every 6 (six) hours as needed for nausea or vomiting. 09/26/19   Antonietta Breach, PA-C  sildenafil (VIAGRA) 50 MG tablet TAKE 1 TABLET BY MOUTH A HALF HOUR PRIOR TO SEXUAL INTERCOURSE AS NEEDED. *LIMIT TO 1 TABLET PER 24  HOURS* Patient taking differently: Take 50 mg by mouth as needed for erectile dysfunction. TAKE 1 TABLET BY MOUTH A HALF HOUR PRIOR TO SEXUAL INTERCOURSE AS NEEDED. *LIMIT TO 1 TABLET PER 24 HOURS* 06/03/19   Ladell Pier, MD    Allergies    Shellfish allergy  Review of Systems   Review of Systems  Constitutional: Negative for chills and fever.  HENT: Negative for congestion and sore throat.   Respiratory: Negative for cough and shortness of breath.   Cardiovascular: Negative for chest pain.  Gastrointestinal: Positive for abdominal pain, diarrhea, nausea and vomiting. Negative for anal bleeding, blood in stool and constipation.  Genitourinary: Negative for dysuria, scrotal swelling and testicular pain.  Neurological: Negative for syncope.  All other systems reviewed and are negative.   Physical Exam Updated Vital Signs BP (!) 149/95 (BP Location: Right Arm)   Pulse 65   Temp 98.2 F (36.8 C) (Oral)   Resp 20   SpO2 96%   Physical Exam Vitals and nursing note reviewed.  Constitutional:      General: He is not in acute distress.    Appearance: He is well-developed. He is not toxic-appearing.  HENT:     Head: Normocephalic and atraumatic.  Eyes:     General:        Right eye: No discharge.        Left eye: No discharge.     Conjunctiva/sclera: Conjunctivae normal.  Cardiovascular:     Rate and Rhythm: Normal rate and regular rhythm.  Pulmonary:     Effort: Pulmonary effort is normal. No respiratory distress.     Breath sounds: Normal breath sounds. No wheezing, rhonchi or rales.  Abdominal:     General: There is no distension.     Palpations: Abdomen is soft.     Tenderness: There is no abdominal tenderness. There is no guarding or rebound. Negative signs include Murphy's sign and McBurney's sign.  Musculoskeletal:     Cervical back: Neck supple.  Skin:    General: Skin is warm and dry.     Findings: No rash.  Neurological:     Mental Status: He is alert.      Comments: Clear speech.   Psychiatric:        Behavior: Behavior normal.    ED Results / Procedures / Treatments   Labs (all labs ordered are listed, but only abnormal results are displayed) Labs Reviewed  LIPASE, BLOOD  COMPREHENSIVE METABOLIC PANEL  CBC  URINALYSIS, ROUTINE W REFLEX MICROSCOPIC    EKG EKG Interpretation  Date/Time:  Friday September 30 2019 11:37:56 EST Ventricular Rate:  63 PR Interval:    QRS Duration: 92 QT Interval:  381 QTC Calculation: 390 R Axis:   73 Text Interpretation: Atrial fibrillation Borderline T wave abnormalities Confirmed by Virgel Manifold [phone]) on  09/30/2019 12:06:12 PM --> Discussed with Dr. Wilson Singer, not in afib, sinus, QTc WNL.   Radiology No results found.  Procedures Procedures (including critical care time)  Medications Ordered in ED Medications  sodium chloride flush (NS) 0.9 % injection 3 mL (has no administration in time range)  ketorolac (TORADOL) 15 MG/ML injection 15 mg (has no administration in time range)  haloperidol lactate (HALDOL) injection 2 mg (has no administration in time range)  pantoprazole (PROTONIX) injection 40 mg (has no administration in time range)  dicyclomine (BENTYL) injection 10 mg (has no administration in time range)  sodium chloride 0.9 % bolus 2,000 mL (2,000 mLs Intravenous New Bag/Given 09/30/19 1201)    ED Course  I have reviewed the triage vital signs and the nursing notes.  Pertinent labs & imaging results that were available during my care of the patient were reviewed by me and considered in my medical decision making (see chart for details).    MDM Rules/Calculators/A&P                      Patient presents to the ED for N/V/D & abdominal pain.  Hx of similar visits. Hx of T1DM & gastroparesis w/ marijuana use.  Abdomen without focal tenderness or peritoneal signs.  EKG with reassuring QTc. Will check labs, hydrate, & medicate with plan for re-assessment.   15:08: Nursing informed me  patient's CBG is 43- he is feeling much better in terms of N/V & abdominal pain, will provide something to eat/drink PO and recheck. Likely secondary to insulin administration this AM with inability to tolerate PO after & subsequent vomiting PTA.   CBC: Mild leukocytosis @ 12.5 felt to be nonspecific.  CMP: Hypoglycemia- drawn around similar time as CBG, hypokalemia- oral replacement. Renal function & LFTs preserved.  Lipase: WNL  13:56: Repeat CBG 77  UA unremarkable.   Repeat abdominal exam w/o peritoneal signs- doubt perf, obstruction, cholecystitis, appendicitis, pancreatitis, or diverticulitis. Patient tolerating PO without difficulty, feeling much better. Overall unclear definitive etiology- possibly multifactorial with gastroparesis & marijuana use- cessation counseling performed.   Nursing informed me patient requesting to leave, his repeat CBG 69, he does not wish to stay for further glucose monitoring, his friend is here to pick him up to take him to go get something to eat.  Upon my return to patient room to further discuss he had eloped from the emergency department. Patient has anti-emetics & anti-spasmodic he can take at home. RN states patient seemed fairly aware of his sxs of hypoglycemia and alerted staff prior to sugar in the 40s. I prepared discharge paperwork for patient should her return.    Final Clinical Impression(s) / ED Diagnoses Final diagnoses:  Non-intractable vomiting with nausea, unspecified vomiting type  Hypoglycemia    Rx / DC Orders ED Discharge Orders    None       Demaris Bousquet, Glynda Jaeger, PA-C 09/30/19 1502 Addendum to clarify nursing information provided regarding patient awareness of his sugars prior to elopement.     Leafy Kindle 09/30/19 1650    Virgel Manifold, MD 10/05/19 [phone]

## 2019-09-30 NOTE — ED Notes (Signed)
ED Provider at bedside. 

## 2019-09-30 NOTE — ED Notes (Addendum)
Pt left prior to being able to review pt discharge paperwork or provide education regarding low CBG.  RN unable to administered prescribed potassium prior to pt elopement. EDP Sammy made aware.

## 2019-09-30 NOTE — ED Notes (Signed)
EKG given to EDP,Palumbo,MD., for review. 

## 2019-09-30 NOTE — ED Notes (Signed)
Lab bloodwork recollected per lab request.

## 2019-09-30 NOTE — ED Notes (Signed)
Pt provided with orange juice, graham crackers and peanut butter, ham sandwich.  Will continue to monitor.

## 2019-09-30 NOTE — ED Notes (Signed)
Pt provided urinal, reports he will attempt to provide urine sample now.

## 2019-09-30 NOTE — ED Triage Notes (Signed)
Per pt, states abdominal pain-left AMA earlier "because he wanted to got out and eat"-states he is back because his stomach hurts

## 2019-09-30 NOTE — ED Notes (Signed)
Pt called x2 for vitals recheck with no answer. This writer found pt and woke him up top recheck his vitals. Pt did not express any new complaints.

## 2019-10-01 ENCOUNTER — Emergency Department (HOSPITAL_COMMUNITY)
Admission: EM | Admit: 2019-10-01 | Discharge: 2019-10-01 | Disposition: A | Discharge status: Home / Self Care | Payer: Medicaid Other | Attending: Emergency Medicine | Admitting: Emergency Medicine

## 2019-10-01 DIAGNOSIS — E109 Type 1 diabetes mellitus without complications: Secondary | ICD-10-CM | POA: Insufficient documentation

## 2019-10-01 DIAGNOSIS — F17210 Nicotine dependence, cigarettes, uncomplicated: Secondary | ICD-10-CM | POA: Diagnosis not present

## 2019-10-01 DIAGNOSIS — R1084 Generalized abdominal pain: Secondary | ICD-10-CM | POA: Diagnosis not present

## 2019-10-01 DIAGNOSIS — Z794 Long term (current) use of insulin: Secondary | ICD-10-CM | POA: Diagnosis not present

## 2019-10-01 DIAGNOSIS — Z79899 Other long term (current) drug therapy: Secondary | ICD-10-CM | POA: Diagnosis not present

## 2019-10-01 DIAGNOSIS — R1013 Epigastric pain: Secondary | ICD-10-CM | POA: Diagnosis present

## 2019-10-01 MED ORDER — SUCRALFATE 1 G PO TABS: 1.0000 g | ORAL_TABLET | Freq: Four times a day (QID) | ORAL | 0 refills | Status: DC

## 2019-10-01 MED ORDER — SUCRALFATE 1 G PO TABS
1.0000 g | ORAL_TABLET | Freq: Once | ORAL | Status: AC
  Administered 2019-10-01: 1 g via ORAL
  Filled 2019-10-01: qty 1

## 2019-10-01 MED ORDER — KETOROLAC TROMETHAMINE 60 MG/2ML IM SOLN
60.0000 mg | Freq: Once | INTRAMUSCULAR | Status: AC
  Administered 2019-10-01: 08:00:00 60 mg via INTRAMUSCULAR
  Filled 2019-10-01: qty 2

## 2019-10-01 MED ORDER — PROMETHAZINE HCL 25 MG RE SUPP: 25.0000 mg | Freq: Four times a day (QID) | RECTAL | 0 refills | Status: DC | PRN

## 2019-10-01 NOTE — ED Triage Notes (Signed)
Transported by [facility] from home-- seen hours ago and discharged for same. +n/v and upper abdominal pain; patient reports no relief with Zofran ODT. Was diagnosed with gastroparesis. VSS with EMS, CBG read 192 mg/dl.

## 2019-10-01 NOTE — ED Provider Notes (Signed)
[facility] DEPT Provider Note   CSN: [id number] Arrival date & time: 09/30/19  1629     History Chief Complaint  Patient presents with  . Abdominal Pain    Jonathan Porter is a 29 y.o. male with a history of diabetes mellitus type 1, cannabis use disorder who presents to the emergency department with a chief complaint of nausea, vomiting, and abdominal pain.  The patient has been seen in the ER on 3 previous occasions for the same since January 3.  He was most recently seen this afternoon around 11 AM.   He reports that he has been having intermittent episodes of nausea and vomiting for the last week.  He reports that his symptoms restarted this morning.  He is unsure of how many episodes of nonbloody, nonbilious vomiting that he has had today.  Reports that he had one episode of loose stools earlier today.  He reports that his pain is located in his epigastric region.  He characterizes the pain as feeling bloated and occasionally burning.  He does not feel as if pain is worse after eating.  No known aggravating or alleviating factors.  He reports that he has been compliant with his home insulin.  Earlier today, he had labs in the ER and was given fluids, Toradol, Haldol, and pantoprazole as well as Bentyl before eloping from the ER.  The patient is established with GI, but is supposed to return for an EGD, but has been unable to get it scheduled at this point.  Reports that he attempted to take his home oral antiemetics but continued to have vomiting.  He endorses cannabis use.  Reports that he last smoked marijuana 2 days ago.  He denies fever, chills, dysuria, hematuria, melena, hematochezia, hematuria, chest pain, shortness of breath, cough, or rash.   The history is provided by the patient. No language interpreter was used.       Past Medical History:  Diagnosis Date  . Diabetes mellitus without complication ([place])   . Gastroparesis     Patient  Active Problem List   Diagnosis Date Noted  . Intractable nausea and vomiting 07/03/2019  . Gastroparesis 07/02/2019  . Influenza vaccine refused 06/03/2019  . Erectile dysfunction 06/03/2019  . Obesity (BMI 30-39.9) 06/03/2019  . Type 1 diabetes mellitus with diabetic polyneuropathy ([place]) 01/21/2019  . Type 1 diabetes mellitus with hyperglycemia ([place]) 01/21/2019  . Paronychia of finger of left hand 01/20/2017  . 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 Diabetes type 1, uncontrolled ([place]) 08/23/2015  . Onychomycosis of toenail 08/23/2015  . Phimosis 08/07/2014    Past Surgical History:  Procedure Laterality Date  . NO PAST SURGERIES         Family History  Problem Relation Age of Onset  . Thyroid disease Mother   . Diabetes Maternal Aunt   . Diabetes Maternal Grandmother   . Diabetes Maternal Grandfather   . Thyroid disease Other     Social History   Tobacco Use  . Smoking status: Current Every Day Smoker    Packs/day: 0.25    Years: 6.00    Pack years: 1.50    Types: Cigarettes  . Smokeless tobacco: Never Used  Substance Use Topics  . Alcohol use: Yes    Comment: social   . Drug use: Yes    Types: Marijuana    Home Medications Prior to Admission medications   Medication Sig Start Date End Date Taking? Authorizing Provider  dicyclomine (BENTYL) 20 MG tablet Take 1 tablet (20 mg total) by mouth every 12 (twelve) hours as needed (for abdominal pain/cramping). 09/26/19  Yes Antonietta Breach, PA-C  insulin aspart (NOVOLOG) 100 UNIT/ML FlexPen Inject 15 Units into the skin 3 (three) times daily with meals. 09/28/18  Yes Ladell Pier, MD  Insulin Glargine (LANTUS SOLOSTAR) 100 UNIT/ML Solostar Pen Inject 15 Units into the skin daily at 10 pm. 09/28/18  Yes Ladell Pier, MD  ondansetron (ZOFRAN) 4 MG tablet Take 4 mg by mouth every 8 (eight) hours as needed for nausea or vomiting.   Yes [provider]  sildenafil (VIAGRA) 50 MG tablet  TAKE 1 TABLET BY MOUTH A HALF HOUR PRIOR TO SEXUAL INTERCOURSE AS NEEDED. *LIMIT TO 1 TABLET PER 24 HOURS* Patient taking differently: Take 50 mg by mouth as needed for erectile dysfunction. TAKE 1 TABLET BY MOUTH A HALF HOUR PRIOR TO SEXUAL INTERCOURSE AS NEEDED. *LIMIT TO 1 TABLET PER 24 HOURS* 06/03/19  Yes Ladell Pier, MD  Accu-Chek FastClix Lancets MISC Use to check blood sugar up to 3 times daily. 06/03/19   Ladell Pier, MD  blood glucose meter kit and supplies KIT Dispense based on patient and insurance preference. Use up to four times daily as directed. (FOR ICD-9 250.00, 250.01). 07/06/19   Robinson, [place] N, PA-C  Blood Glucose Monitoring Suppl (ACCU-CHEK GUIDE ME) w/Device KIT 1 kit by Does not apply route 3 (three) times daily. Use to check blood sugar up to 3 times daily. 06/03/19   Ladell Pier, MD  glucose blood (ACCU-CHEK GUIDE) test strip 4x a day 06/03/19   Ladell Pier, MD  Insulin Pen Needle (B-D ULTRAFINE III SHORT PEN) 31G X 8 MM MISC 1 application by Does not apply route 4 (four) times daily. 05/21/18   Ladell Pier, MD  promethazine (PHENERGAN) 25 MG suppository Place 1 suppository (25 mg total) rectally every 6 (six) hours as needed for nausea or vomiting. 10/01/19   Rashan Patient A, PA-C  sucralfate (CARAFATE) 1 g tablet Take 1 tablet (1 g total) by mouth 4 (four) times daily for 14 days. 10/01/19 10/15/19  Merida Alcantar A, PA-C    Allergies    Shellfish allergy  Review of Systems   Review of Systems  Constitutional: Negative for appetite change, chills, diaphoresis and fever.  Respiratory: Negative for cough, shortness of breath and wheezing.   Cardiovascular: Negative for chest pain and palpitations.  Gastrointestinal: Positive for abdominal pain, diarrhea, nausea and vomiting. Negative for constipation.  Genitourinary: Negative for discharge, dysuria, penile pain, penile swelling, scrotal swelling, testicular pain and urgency.  Musculoskeletal:  Negative for back pain, myalgias, neck pain and neck stiffness.  Skin: Negative for rash.  Allergic/Immunologic: Negative for immunocompromised state.  Neurological: Negative for dizziness, seizures, weakness, numbness and headaches.  Psychiatric/Behavioral: Negative for confusion.    Physical Exam Updated Vital Signs BP (!) 159/93   Pulse 69   Temp 97.7 F (36.5 C) (Oral)   Resp 14   Ht 6' (1.829 m)   SpO2 100%   BMI 29.54 kg/m   Physical Exam Vitals and nursing note reviewed.  Constitutional:      Appearance: He is well-developed.     Comments: Well-appearing.  No acute distress.  HENT:     Head: Normocephalic.     Mouth/Throat:     Mouth: Mucous membranes are moist.  Eyes:     Conjunctiva/sclera: Conjunctivae normal.  Cardiovascular:  Rate and Rhythm: Normal rate and regular rhythm.     Pulses: Normal pulses.     Heart sounds: Normal heart sounds. No murmur. No friction rub. No gallop.   Pulmonary:     Effort: Pulmonary effort is normal. No respiratory distress.     Breath sounds: No stridor. No wheezing, rhonchi or rales.  Chest:     Chest wall: No tenderness.  Abdominal:     General: There is no distension.     Palpations: Abdomen is soft.     Tenderness: There is abdominal tenderness.     Comments: Diffusely tender to palpation throughout the abdomen.  Maximum tenderness is in the epigastric region.  There is no rebound or guarding.  Negative Murphy sign.  Normoactive bowel sounds.  No CVA tenderness bilaterally.  No tenderness over McBurney's point.  Musculoskeletal:     Cervical back: Neck supple.  Skin:    General: Skin is warm and dry.     Capillary Refill: Capillary refill takes less than 2 seconds.  Neurological:     Mental Status: He is alert.  Psychiatric:        Behavior: Behavior normal.    ED Results / Procedures / Treatments   Labs (all labs ordered are listed, but only abnormal results are displayed) Labs Reviewed  CBG MONITORING, ED -  Abnormal; Notable for the following components:      Result Value   Glucose-Capillary 173 (*)    All other components within normal limits  CBG MONITORING, ED    EKG EKG Interpretation  Date/Time:  Friday September 30 2019 23:53:32 EST Ventricular Rate:  71 PR Interval:    QRS Duration: 92 QT Interval:  417 QTC Calculation: 460 R Axis:   67 Text Interpretation: Sinus arrhythmia Confirmed by Dory Horn) on 09/30/2019 11:56:16 PM   Radiology No results found.  Procedures Procedures (including critical care time)  Medications Ordered in ED Medications  haloperidol lactate (HALDOL) injection 2 mg (2 mg Intravenous Given 09/30/19 2359)  famotidine (PEPCID) IVPB 20 mg premix (0 mg Intravenous Stopped 10/01/19 0043)  sucralfate (CARAFATE) tablet 1 g (1 g Oral Given 10/01/19 0048)    ED Course  I have reviewed the triage vital signs and the nursing notes.  Pertinent labs & imaging results that were available during my care of the patient were reviewed by me and considered in my medical decision making (see chart for details).    MDM Rules/Calculators/A&P                      29 year old male with a history of diabetes mellitus type 1, cannabis use disorder presenting for his second ER visit today with nausea, vomiting, and upper abdominal pain.  Labs from earlier ER visit have been reviewed and are unremarkable.  Of note, he did become hypoglycemic during his previous ER visit, but CBG is 173.  The patient's medical record has been reviewed at length.  He had a negative gastric emptying study with GI several months ago.  He is supposed to follow-up and have an EGD.  He had an unremarkable CT in October.  He also had an unremarkable right upper quadrant ultrasound a year ago without evidence of gallstones.  On exam, he has a negative Murphy sign.  With normal hepatic function testing and lipase, doubt cholecystitis, choledocholithiasis, or pancreatitis.  Doubt ruptured peptic  ulcer.  He has no rebound or guarding, and I doubt he has a surgical abdomen.  Given that he is describing the pain as burning, we will give the patient a dose of Carafate and Pepcid in the ER.  He was also given a dose of Haldol given his history of chronic cannabis use, and following this treatment he reported significant improvement in his symptoms.  He was able to tolerate fluids by mouth prior to discharge.  I strongly encouraged cannabis cessation and follow-up with GI.  Will discharge with Phenergan suppositories and Carafate.  He is hemodynamically stable and in no acute distress.  Safe for discharge home with outpatient follow-up as indicated.  Final Clinical Impression(s) / ED Diagnoses Final diagnoses:  Generalized abdominal pain  Non-intractable vomiting with nausea, unspecified vomiting type    Rx / DC Orders ED Discharge Orders         Ordered    sucralfate (CARAFATE) 1 g tablet  4 times daily     10/01/19 0119    promethazine (PHENERGAN) 25 MG suppository  Every 6 hours PRN     10/01/19 0119           Joanne Gavel, PA-C 10/01/19 0159    Palumbo, April, MD 10/01/19 [id number]

## 2019-10-01 NOTE — ED Notes (Signed)
Pt able to tolerate water without vomiting.

## 2019-10-01 NOTE — ED Notes (Signed)
ED Provider at bedside. 

## 2019-10-01 NOTE — Discharge Instructions (Addendum)
Thank you for allowing me to care for you today in the Emergency Department.   Please call to schedule a follow-up appoint with gastroenterology.  You can insert 1 Phenergan suppository rectally for nausea and vomiting every 6 hours if you are unable to keep down nausea medication by mouth.  For the burning pain, you can try taking 1 tablet of Carafate every 6 hours.  Omeprazole is also available over-the-counter and you can try taking 1 tablet daily.  I would avoid smoking marijuana completely as this can make your symptoms worse.  Return to the emergency department if you stop making urine due to severe dehydration, if you develop severe abdominal pain with a high fever, black or bloody vomiting or stool, or other new, concerning symptoms.

## 2019-10-01 NOTE — ED Provider Notes (Signed)
[facility] DEPT Provider Note   CSN: [id number] Arrival date & time: 10/01/19  1856     History Chief Complaint  Patient presents with  . Abdominal Pain    Jonathan Porter is a 29 y.o. male.  29 year old male with history of diabetes and cannabis use who presents with worsening chronic abdominal discomfort.  Patient seen just a few hours ago and was treated and released.  States that he got home and then he developed epigastric pain without fever or vomiting.  No diarrhea.  Took his home medications without relief.  Is scheduled to see GI on Monday.  Patient has been seen multiple times in the department for similar complaints over the last several days.        Past Medical History:  Diagnosis Date  . Diabetes mellitus without complication ([place])   . Gastroparesis     Patient Active Problem List   Diagnosis Date Noted  . Intractable nausea and vomiting 07/03/2019  . Gastroparesis 07/02/2019  . Influenza vaccine refused 06/03/2019  . Erectile dysfunction 06/03/2019  . Obesity (BMI 30-39.9) 06/03/2019  . Type 1 diabetes mellitus with diabetic polyneuropathy ([place]) 01/21/2019  . Type 1 diabetes mellitus with hyperglycemia ([place]) 01/21/2019  . Paronychia of finger of left hand 01/20/2017  . 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 Diabetes type 1, uncontrolled ([place]) 08/23/2015  . Onychomycosis of toenail 08/23/2015  . Phimosis 08/07/2014    Past Surgical History:  Procedure Laterality Date  . NO PAST SURGERIES         Family History  Problem Relation Age of Onset  . Thyroid disease Mother   . Diabetes Maternal Aunt   . Diabetes Maternal Grandmother   . Diabetes Maternal Grandfather   . Thyroid disease Other     Social History   Tobacco Use  . Smoking status: Current Every Day Smoker    Packs/day: 0.25    Years: 6.00    Pack years: 1.50    Types: Cigarettes  . Smokeless tobacco: Never Used  Substance Use  Topics  . Alcohol use: Yes    Comment: social   . Drug use: Yes    Types: Marijuana    Home Medications Prior to Admission medications   Medication Sig Start Date End Date Taking? Authorizing Provider  Accu-Chek FastClix Lancets MISC Use to check blood sugar up to 3 times daily. 06/03/19   Ladell Pier, MD  blood glucose meter kit and supplies KIT Dispense based on patient and insurance preference. Use up to four times daily as directed. (FOR ICD-9 250.00, 250.01). 07/06/19   Robinson, [place] N, PA-C  Blood Glucose Monitoring Suppl (ACCU-CHEK GUIDE ME) w/Device KIT 1 kit by Does not apply route 3 (three) times daily. Use to check blood sugar up to 3 times daily. 06/03/19   Ladell Pier, MD  dicyclomine (BENTYL) 20 MG tablet Take 1 tablet (20 mg total) by mouth every 12 (twelve) hours as needed (for abdominal pain/cramping). 09/26/19   Antonietta Breach, PA-C  glucose blood (ACCU-CHEK GUIDE) test strip 4x a day 06/03/19   Ladell Pier, MD  insulin aspart (NOVOLOG) 100 UNIT/ML FlexPen Inject 15 Units into the skin 3 (three) times daily with meals. 09/28/18   Ladell Pier, MD  Insulin Glargine (LANTUS SOLOSTAR) 100 UNIT/ML Solostar Pen Inject 15 Units into the skin daily at 10 pm. 09/28/18   Ladell Pier, MD  Insulin Pen Needle (B-D ULTRAFINE III SHORT  PEN) 31G X 8 MM MISC 1 application by Does not apply route 4 (four) times daily. 05/21/18   Ladell Pier, MD  ondansetron (ZOFRAN) 4 MG tablet Take 4 mg by mouth every 8 (eight) hours as needed for nausea or vomiting.    [provider]  promethazine (PHENERGAN) 25 MG suppository Place 1 suppository (25 mg total) rectally every 6 (six) hours as needed for nausea or vomiting. 10/01/19   McDonald, Mia A, PA-C  sildenafil (VIAGRA) 50 MG tablet TAKE 1 TABLET BY MOUTH A HALF HOUR PRIOR TO SEXUAL INTERCOURSE AS NEEDED. *LIMIT TO 1 TABLET PER 24 HOURS* Patient taking differently: Take 50 mg by mouth as needed for erectile  dysfunction. TAKE 1 TABLET BY MOUTH A HALF HOUR PRIOR TO SEXUAL INTERCOURSE AS NEEDED. *LIMIT TO 1 TABLET PER 24 HOURS* 06/03/19   Ladell Pier, MD  sucralfate (CARAFATE) 1 g tablet Take 1 tablet (1 g total) by mouth 4 (four) times daily for 14 days. 10/01/19 10/15/19  McDonald, Mia A, PA-C    Allergies    Shellfish allergy  Review of Systems   Review of Systems  All other systems reviewed and are negative.   Physical Exam Updated Vital Signs BP (!) 148/104   Pulse 64   Temp 98.2 F (36.8 C) (Oral)   Resp 20   SpO2 100%   Physical Exam Vitals and nursing note reviewed.  Constitutional:      General: He is not in acute distress.    Appearance: Normal appearance. He is well-developed. He is not toxic-appearing.  HENT:     Head: Normocephalic and atraumatic.  Eyes:     General: Lids are normal.     Conjunctiva/sclera: Conjunctivae normal.     Pupils: Pupils are equal, round, and reactive to light.  Neck:     Thyroid: No thyroid mass.     Trachea: No tracheal deviation.  Cardiovascular:     Rate and Rhythm: Normal rate and regular rhythm.     Heart sounds: Normal heart sounds. No murmur. No gallop.   Pulmonary:     Effort: Pulmonary effort is normal. No respiratory distress.     Breath sounds: Normal breath sounds. No stridor. No decreased breath sounds, wheezing, rhonchi or rales.  Abdominal:     General: Bowel sounds are normal. There is no distension.     Palpations: Abdomen is soft.     Tenderness: There is no abdominal tenderness. There is no rebound.  Musculoskeletal:        General: No tenderness. Normal range of motion.     Cervical back: Normal range of motion and neck supple.  Skin:    General: Skin is warm and dry.     Findings: No abrasion or rash.  Neurological:     Mental Status: He is alert and oriented to person, place, and time.     GCS: GCS eye subscore is 4. GCS verbal subscore is 5. GCS motor subscore is 6.     Cranial Nerves: No cranial nerve  deficit.     Sensory: No sensory deficit.  Psychiatric:        Speech: Speech normal.        Behavior: Behavior normal.     ED Results / Procedures / Treatments   Labs (all labs ordered are listed, but only abnormal results are displayed) Labs Reviewed - No data to display  EKG None  Radiology No results found.  Procedures Procedures (including critical care time)  Medications Ordered in ED Medications  ketorolac (TORADOL) injection 60 mg (has no administration in time range)    ED Course  I have reviewed the triage vital signs and the nursing notes.  Pertinent labs & imaging results that were available during my care of the patient were reviewed by me and considered in my medical decision making (see chart for details).    MDM Rules/Calculators/A&P                      Patient will be given IM Toradol here and discharge and will follow with his doctor Final Clinical Impression(s) / ED Diagnoses Final diagnoses:  None    Rx / DC Orders ED Discharge Orders    None       Lacretia Leigh, MD 10/01/19 [phone]

## 2019-10-01 NOTE — ED Notes (Signed)
Pt provided water for PO challenge

## 2019-10-02 ENCOUNTER — Encounter (HOSPITAL_COMMUNITY): Payer: Self-pay

## 2019-10-02 ENCOUNTER — Emergency Department (HOSPITAL_COMMUNITY)
Admission: EM | Admit: 2019-10-02 | Discharge: 2019-10-02 | Disposition: A | Discharge status: Home / Self Care | Payer: Medicaid Other | Attending: Emergency Medicine | Admitting: Emergency Medicine

## 2019-10-02 ENCOUNTER — Other Ambulatory Visit: Payer: Self-pay

## 2019-10-02 ENCOUNTER — Emergency Department (HOSPITAL_COMMUNITY): Payer: Medicaid Other

## 2019-10-02 DIAGNOSIS — R1013 Epigastric pain: Secondary | ICD-10-CM | POA: Diagnosis present

## 2019-10-02 DIAGNOSIS — Z794 Long term (current) use of insulin: Secondary | ICD-10-CM | POA: Diagnosis not present

## 2019-10-02 DIAGNOSIS — E1065 Type 1 diabetes mellitus with hyperglycemia: Secondary | ICD-10-CM | POA: Insufficient documentation

## 2019-10-02 DIAGNOSIS — Z79899 Other long term (current) drug therapy: Secondary | ICD-10-CM | POA: Insufficient documentation

## 2019-10-02 DIAGNOSIS — F17210 Nicotine dependence, cigarettes, uncomplicated: Secondary | ICD-10-CM | POA: Insufficient documentation

## 2019-10-02 DIAGNOSIS — R109 Unspecified abdominal pain: Secondary | ICD-10-CM

## 2019-10-02 LAB — COMPREHENSIVE METABOLIC PANEL
ALT: 19 U/L (ref 0–44)
AST: 33 U/L (ref 15–41)
Albumin: 4.5 g/dL (ref 3.5–5.0)
Alkaline Phosphatase: 105 U/L (ref 38–126)
Anion gap: 13 (ref 5–15)
BUN: 9 mg/dL (ref 6–20)
CO2: 24 mmol/L (ref 22–32)
Calcium: 9.2 mg/dL (ref 8.9–10.3)
Chloride: 100 mmol/L (ref 98–111)
Creatinine, Ser: 1.09 mg/dL (ref 0.61–1.24)
GFR calc Af Amer: >60 mL/min (ref >60)
GFR calc non Af Amer: >60 mL/min (ref >60)
Glucose, Bld: 230 mg/dL — ABNORMAL HIGH (ref 70–99)
Potassium: 4.4 mmol/L (ref 3.5–5.1)
Sodium: 137 mmol/L (ref 135–145)
Total Bilirubin: 0.5 mg/dL (ref 0.3–1.2)
Total Protein: 8.1 g/dL (ref 6.5–8.1)

## 2019-10-02 LAB — CBC WITH DIFFERENTIAL/PLATELET
Abs Immature Granulocytes: 0.02 10*3/uL (ref 0.00–0.07)
Basophils Absolute: 0.1 10*3/uL (ref 0.0–0.1)
Basophils Relative: 1 %
Eosinophils Absolute: 0.1 10*3/uL (ref 0.0–0.5)
Eosinophils Relative: 1 %
HCT: 43.8 % (ref 39.0–52.0)
Hemoglobin: 14.4 g/dL (ref 13.0–17.0)
Immature Granulocytes: 0 %
Lymphocytes Relative: 19 %
Lymphs Abs: 1.4 10*3/uL (ref 0.7–4.0)
MCH: 27.1 pg (ref 26.0–34.0)
MCHC: 32.9 g/dL (ref 30.0–36.0)
MCV: 82.3 fL (ref 80.0–100.0)
Monocytes Absolute: 0.3 10*3/uL (ref 0.1–1.0)
Monocytes Relative: 4 %
Neutro Abs: 5.2 10*3/uL (ref 1.7–7.7)
Neutrophils Relative %: 75 %
Platelets: 349 10*3/uL (ref 150–400)
RBC: 5.32 MIL/uL (ref 4.22–5.81)
RDW: 13.6 % (ref 11.5–15.5)
WBC: 7 10*3/uL (ref 4.0–10.5)
nRBC: 0 % (ref 0.0–0.2)

## 2019-10-02 LAB — LIPASE, BLOOD: Lipase: 16 U/L (ref 11–51)

## 2019-10-02 MED ORDER — SODIUM CHLORIDE (PF) 0.9 % IJ SOLN
INTRAMUSCULAR | Status: AC
  Filled 2019-10-02: qty 50

## 2019-10-02 MED ORDER — LORAZEPAM 2 MG/ML IJ SOLN
0.5000 mg | Freq: Once | INTRAMUSCULAR | Status: AC
  Administered 2019-10-02: 08:00:00 0.5 mg via INTRAVENOUS
  Filled 2019-10-02: qty 1

## 2019-10-02 MED ORDER — SODIUM CHLORIDE 0.9 % IV BOLUS
1000.0000 mL | Freq: Once | INTRAVENOUS | Status: AC
  Administered 2019-10-02: 1000 mL via INTRAVENOUS

## 2019-10-02 MED ORDER — SODIUM CHLORIDE 0.9 % IV SOLN: INTRAVENOUS | Status: DC

## 2019-10-02 MED ORDER — IOHEXOL 300 MG/ML  SOLN
100.0000 mL | Freq: Once | INTRAMUSCULAR | Status: AC | PRN
  Administered 2019-10-02: 08:00:00 100 mL via INTRAVENOUS

## 2019-10-02 MED ORDER — METOCLOPRAMIDE HCL 5 MG/ML IJ SOLN
10.0000 mg | Freq: Once | INTRAMUSCULAR | Status: AC
  Administered 2019-10-02: 10 mg via INTRAVENOUS
  Filled 2019-10-02: qty 2

## 2019-10-02 MED ORDER — MORPHINE SULFATE (PF) 4 MG/ML IV SOLN
4.0000 mg | Freq: Once | INTRAVENOUS | Status: AC
  Administered 2019-10-02: 4 mg via INTRAVENOUS
  Filled 2019-10-02: qty 1

## 2019-10-02 NOTE — ED Notes (Addendum)
Per his request, accompanied Dr Freida Busman in room to speak with pt about spitting on floor and generally being disrespectful to staff whenever he is here for treatment. Pt was observed to be pacing, immediately became belligerent, cursing and yelling at this writer and Dr Freida Busman. Pt was advised that we would return we he was more calm. Pt continued to curse and yell after exiting the room.

## 2019-10-02 NOTE — ED Provider Notes (Signed)
[facility] DEPT Provider Note   CSN: [id number] Arrival date & time: 10/02/19  6789     History Chief Complaint  Patient presents with  . Abdominal Pain    Jonathan Porter is a 29 y.o. male.  29 year old male with history of diabetes and gastroparesis who presents with ongoing emesis.  Patient's had multiple visits for similar complaints over the last several days.  Seen by myself yesterday for similar complaints.  Complains of epigastric pain with nonbilious emesis.  No fever or chills.  Symptoms unresponsive to home medications.        Past Medical History:  Diagnosis Date  . Diabetes mellitus without complication ([place])   . Gastroparesis     Patient Active Problem List   Diagnosis Date Noted  . Intractable nausea and vomiting 07/03/2019  . Gastroparesis 07/02/2019  . Influenza vaccine refused 06/03/2019  . Erectile dysfunction 06/03/2019  . Obesity (BMI 30-39.9) 06/03/2019  . Type 1 diabetes mellitus with diabetic polyneuropathy ([place]) 01/21/2019  . Type 1 diabetes mellitus with hyperglycemia ([place]) 01/21/2019  . Paronychia of finger of left hand 01/20/2017  . 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 Diabetes type 1, uncontrolled ([place]) 08/23/2015  . Onychomycosis of toenail 08/23/2015  . Phimosis 08/07/2014    Past Surgical History:  Procedure Laterality Date  . NO PAST SURGERIES         Family History  Problem Relation Age of Onset  . Thyroid disease Mother   . Diabetes Maternal Aunt   . Diabetes Maternal Grandmother   . Diabetes Maternal Grandfather   . Thyroid disease Other     Social History   Tobacco Use  . Smoking status: Current Every Day Smoker    Packs/day: 0.25    Years: 6.00    Pack years: 1.50    Types: Cigarettes  . Smokeless tobacco: Never Used  Substance Use Topics  . Alcohol use: Yes    Comment: social   . Drug use: Yes    Types: Marijuana    Home Medications Prior to  Admission medications   Medication Sig Start Date End Date Taking? Authorizing Provider  Accu-Chek FastClix Lancets MISC Use to check blood sugar up to 3 times daily. 06/03/19   Ladell Pier, MD  blood glucose meter kit and supplies KIT Dispense based on patient and insurance preference. Use up to four times daily as directed. (FOR ICD-9 250.00, 250.01). 07/06/19   Robinson, [place] N, PA-C  Blood Glucose Monitoring Suppl (ACCU-CHEK GUIDE ME) w/Device KIT 1 kit by Does not apply route 3 (three) times daily. Use to check blood sugar up to 3 times daily. 06/03/19   Ladell Pier, MD  dicyclomine (BENTYL) 20 MG tablet Take 1 tablet (20 mg total) by mouth every 12 (twelve) hours as needed (for abdominal pain/cramping). 09/26/19   Antonietta Breach, PA-C  glucose blood (ACCU-CHEK GUIDE) test strip 4x a day 06/03/19   Ladell Pier, MD  insulin aspart (NOVOLOG) 100 UNIT/ML FlexPen Inject 15 Units into the skin 3 (three) times daily with meals. 09/28/18   Ladell Pier, MD  Insulin Glargine (LANTUS SOLOSTAR) 100 UNIT/ML Solostar Pen Inject 15 Units into the skin daily at 10 pm. 09/28/18   Ladell Pier, MD  Insulin Pen Needle (B-D ULTRAFINE III SHORT PEN) 31G X 8 MM MISC 1 application by Does not apply route 4 (four) times daily. 05/21/18   Ladell Pier, MD  ondansetron [facility]) 4  MG tablet Take 4 mg by mouth every 8 (eight) hours as needed for nausea or vomiting.    [provider]  promethazine (PHENERGAN) 25 MG suppository Place 1 suppository (25 mg total) rectally every 6 (six) hours as needed for nausea or vomiting. 10/01/19   McDonald, Mia A, PA-C  sildenafil (VIAGRA) 50 MG tablet TAKE 1 TABLET BY MOUTH A HALF HOUR PRIOR TO SEXUAL INTERCOURSE AS NEEDED. *LIMIT TO 1 TABLET PER 24 HOURS* Patient taking differently: Take 50 mg by mouth as needed for erectile dysfunction. TAKE 1 TABLET BY MOUTH A HALF HOUR PRIOR TO SEXUAL INTERCOURSE AS NEEDED. *LIMIT TO 1 TABLET PER 24 HOURS* 06/03/19    Ladell Pier, MD  sucralfate (CARAFATE) 1 g tablet Take 1 tablet (1 g total) by mouth 4 (four) times daily for 14 days. 10/01/19 10/15/19  McDonald, Mia A, PA-C    Allergies    Shellfish allergy  Review of Systems   Review of Systems  All other systems reviewed and are negative.   Physical Exam Updated Vital Signs BP (!) 155/96 (BP Location: Left Arm)   Pulse 78   Temp 98.2 F (36.8 C) (Oral)   Resp 20   SpO2 100%   Physical Exam Vitals and nursing note reviewed.  Constitutional:      General: He is not in acute distress.    Appearance: Normal appearance. He is well-developed. He is not toxic-appearing.  HENT:     Head: Normocephalic and atraumatic.  Eyes:     General: Lids are normal.     Conjunctiva/sclera: Conjunctivae normal.     Pupils: Pupils are equal, round, and reactive to light.  Neck:     Thyroid: No thyroid mass.     Trachea: No tracheal deviation.  Cardiovascular:     Rate and Rhythm: Normal rate and regular rhythm.     Heart sounds: Normal heart sounds. No murmur. No gallop.   Pulmonary:     Effort: Pulmonary effort is normal. No respiratory distress.     Breath sounds: Normal breath sounds. No stridor. No decreased breath sounds, wheezing, rhonchi or rales.  Abdominal:     General: Bowel sounds are normal. There is no distension.     Palpations: Abdomen is soft.     Tenderness: There is no abdominal tenderness. There is no guarding or rebound.  Musculoskeletal:        General: No tenderness. Normal range of motion.     Cervical back: Normal range of motion and neck supple.  Skin:    General: Skin is warm and dry.     Findings: No abrasion or rash.  Neurological:     Mental Status: He is alert and oriented to person, place, and time.     GCS: GCS eye subscore is 4. GCS verbal subscore is 5. GCS motor subscore is 6.     Cranial Nerves: No cranial nerve deficit.     Sensory: No sensory deficit.  Psychiatric:        Speech: Speech normal.         Behavior: Behavior normal.     ED Results / Procedures / Treatments   Labs (all labs ordered are listed, but only abnormal results are displayed) Labs Reviewed  CBC WITH DIFFERENTIAL/PLATELET  COMPREHENSIVE METABOLIC PANEL  LIPASE, BLOOD  RAPID URINE DRUG SCREEN, HOSP PERFORMED    EKG None  Radiology No results found.  Procedures Procedures (including critical care time)  Medications Ordered in ED Medications  0.9 %  sodium chloride infusion (has no administration in time range)  sodium chloride (PF) 0.9 % injection (has no administration in time range)  metoCLOPramide (REGLAN) injection 10 mg (has no administration in time range)  LORazepam (ATIVAN) injection 0.5 mg (has no administration in time range)  morphine 4 MG/ML injection 4 mg (has no administration in time range)  sodium chloride 0.9 % bolus 1,000 mL (1,000 mLs Intravenous New Bag/Given 10/02/19 0800)    ED Course  I have reviewed the triage vital signs and the nursing notes.  Pertinent labs & imaging results that were available during my care of the patient were reviewed by me and considered in my medical decision making (see chart for details).    MDM Rules/Calculators/A&P                     Labs reassuring here with exception of mild hyperglycemia 230. Abdominal CT negative for acute process.  She with IV fluids and antiemetics here.  Feels better.  Encouraged to see his GI doctor tomorrow. Final Clinical Impression(s) / ED Diagnoses Final diagnoses:  None    Rx / DC Orders ED Discharge Orders    None       Lacretia Leigh, MD 10/02/19 1012

## 2019-10-02 NOTE — ED Notes (Signed)
While in room, pt said he is unable to provide a urine specimen. He constantly paced the room and verbalized that he was in pain and groaned. After I inserted the IV, he began calling out using the call bell and saying he needed to see a doctor immediately. I explained the call bell is for the nurse and not for the doctor. He continued to call using the call bell. I explained that I was in the room and the call bell is for the nurse. He became angry, and yelled that I could not tell him not to use the call bell.

## 2019-10-02 NOTE — ED Triage Notes (Addendum)
Pt BIB [facility] from home. Reports that it has been intermittent for 2 weeks and started about 1 hour ago. Endorses N/V. 7/10. Denies cannabis use today.

## 2019-10-02 NOTE — ED Notes (Addendum)
Pt Vomiting on floor, housekeeping came and cleaned it up and pt immediately vomited on floor and told EVS it was their job to clean up and called her a "Bitch" multiple times. Security in lobby to deescalate. Pt has been given multiple emesis bags.

## 2019-10-02 NOTE — ED Notes (Addendum)
Pt said he has been in and out of here all week with the same 10/10 abdominal pain. He takes all the medication he has been prescribed prior to coming in, but it doesn't help. The medications we give him while he is in the ED work. As I was gathering supplies to start and IV, he angrily demanded, "Are you going to help me!" I assured him I was here to assist him.

## 2019-10-09 ENCOUNTER — Emergency Department (HOSPITAL_COMMUNITY)
Admission: EM | Admit: 2019-10-09 | Discharge: 2019-10-09 | Disposition: A | Discharge status: Home / Self Care | Payer: Medicaid Other | Attending: Emergency Medicine | Admitting: Emergency Medicine

## 2019-10-09 ENCOUNTER — Other Ambulatory Visit: Payer: Self-pay

## 2019-10-09 ENCOUNTER — Encounter (HOSPITAL_COMMUNITY): Payer: Self-pay | Admitting: Emergency Medicine

## 2019-10-09 DIAGNOSIS — E109 Type 1 diabetes mellitus without complications: Secondary | ICD-10-CM | POA: Insufficient documentation

## 2019-10-09 DIAGNOSIS — Z79899 Other long term (current) drug therapy: Secondary | ICD-10-CM | POA: Insufficient documentation

## 2019-10-09 DIAGNOSIS — R109 Unspecified abdominal pain: Secondary | ICD-10-CM | POA: Diagnosis present

## 2019-10-09 DIAGNOSIS — F17210 Nicotine dependence, cigarettes, uncomplicated: Secondary | ICD-10-CM | POA: Insufficient documentation

## 2019-10-09 DIAGNOSIS — R1115 Cyclical vomiting syndrome unrelated to migraine: Secondary | ICD-10-CM | POA: Insufficient documentation

## 2019-10-09 LAB — CBG MONITORING, ED: Glucose-Capillary: 205 mg/dL — ABNORMAL HIGH (ref 70–99)

## 2019-10-09 MED ORDER — SODIUM CHLORIDE 0.9 % IV BOLUS
1000.0000 mL | Freq: Once | INTRAVENOUS | Status: AC
  Administered 2019-10-09: 1000 mL via INTRAVENOUS

## 2019-10-09 MED ORDER — HALOPERIDOL LACTATE 5 MG/ML IJ SOLN
2.0000 mg | Freq: Once | INTRAMUSCULAR | Status: AC
  Administered 2019-10-09: 12:00:00 2 mg via INTRAVENOUS
  Filled 2019-10-09: qty 1

## 2019-10-09 NOTE — Discharge Instructions (Addendum)
Today you received medications that may make you sleepy or impair your ability to make decisions.  For the next 24 hours please do not drive, operate heavy machinery, care for a small child with out another adult present, or perform any activities that may cause harm to you or someone else if you were to fall asleep or be impaired.   

## 2019-10-09 NOTE — ED Notes (Signed)
When RN entered room to discharge patient, pt had left. Pt left IV hanging on trash can.

## 2019-10-09 NOTE — ED Triage Notes (Signed)
Per pt, started having some abdominal cramping this am-took all his meds to see if they would help-states still having discomfort and nausea-has'nt smoked THC in 4 days-still smokes cigs

## 2019-10-09 NOTE — ED Notes (Signed)
Pt given apple juice and crackers for PO challenge.  

## 2019-10-09 NOTE — ED Provider Notes (Signed)
[facility] DEPT Provider Note   CSN: [id number] Arrival date & time: 10/09/19  1023     History Chief Complaint  Patient presents with  . Abdominal Pain    Jonathan Porter is a 29 y.o. male with a past medical history of DM 1, gastroparesis, marijuana use, cyclic vomiting, who presents today for evaluation of abdominal cramping.  He reports that this morning when he woke up he had cramping in his abdomen consistent with his usual baseline.  He reports that he vomited twice.  He denies any significant diarrhea.  He states that he took his home medications of Zofran and Phenergan without relief causing him to present here today. On my initial exam he reports that he feels better after getting Haldol.  He states that he thinks the Haldol made him better.  He denies any trauma.  He states that this is consistent with his usual symptoms and not different are concerning to him in any way. He states that he did not take his insulin last night.  He denies any fevers.  No recent trauma.  He states that he has not consumed any marijuana in the past 4 days.  He states that he has not followed up with his GI doctor recently.  HPI     Past Medical History:  Diagnosis Date  . Diabetes mellitus without complication ([place])   . Gastroparesis     Patient Active Problem List   Diagnosis Date Noted  . Intractable nausea and vomiting 07/03/2019  . Gastroparesis 07/02/2019  . Influenza vaccine refused 06/03/2019  . Erectile dysfunction 06/03/2019  . Obesity (BMI 30-39.9) 06/03/2019  . Type 1 diabetes mellitus with diabetic polyneuropathy ([place]) 01/21/2019  . Type 1 diabetes mellitus with hyperglycemia ([place]) 01/21/2019  . Paronychia of finger of left hand 01/20/2017  . 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 Diabetes type 1, uncontrolled ([place]) 08/23/2015  . Onychomycosis of toenail 08/23/2015  . Phimosis 08/07/2014    Past Surgical History:   Procedure Laterality Date  . NO PAST SURGERIES         Family History  Problem Relation Age of Onset  . Thyroid disease Mother   . Diabetes Maternal Aunt   . Diabetes Maternal Grandmother   . Diabetes Maternal Grandfather   . Thyroid disease Other     Social History   Tobacco Use  . Smoking status: Current Every Day Smoker    Packs/day: 0.25    Years: 6.00    Pack years: 1.50    Types: Cigarettes  . Smokeless tobacco: Never Used  Substance Use Topics  . Alcohol use: Yes    Comment: social   . Drug use: Yes    Types: Marijuana    Home Medications Prior to Admission medications   Medication Sig Start Date End Date Taking? Authorizing Provider  dicyclomine (BENTYL) 20 MG tablet Take 1 tablet (20 mg total) by mouth every 12 (twelve) hours as needed (for abdominal pain/cramping). 09/26/19  Yes Antonietta Breach, PA-C  insulin aspart (NOVOLOG) 100 UNIT/ML FlexPen Inject 15 Units into the skin 3 (three) times daily with meals. 09/28/18  Yes Ladell Pier, MD  Insulin Glargine (LANTUS SOLOSTAR) 100 UNIT/ML Solostar Pen Inject 15 Units into the skin daily at 10 pm. 09/28/18  Yes Ladell Pier, MD  ondansetron (ZOFRAN) 4 MG tablet Take 4 mg by mouth every 8 (eight) hours as needed for nausea or vomiting.   Yes [provider]  PROMETHAZINE HCL PO Take 1 tablet by mouth every 6 (six) hours as needed (nausea/vomitting).   Yes [provider]  sildenafil (VIAGRA) 50 MG tablet TAKE 1 TABLET BY MOUTH A HALF HOUR PRIOR TO SEXUAL INTERCOURSE AS NEEDED. *LIMIT TO 1 TABLET PER 24 HOURS* Patient taking differently: Take 50 mg by mouth as needed for erectile dysfunction. TAKE 1 TABLET BY MOUTH A HALF HOUR PRIOR TO SEXUAL INTERCOURSE AS NEEDED. *LIMIT TO 1 TABLET PER 24 HOURS* 06/03/19  Yes Ladell Pier, MD  sucralfate (CARAFATE) 1 g tablet Take 1 tablet (1 g total) by mouth 4 (four) times daily for 14 days. 10/01/19 10/15/19 Yes McDonald, Mia A, PA-C  Accu-Chek FastClix  Lancets MISC Use to check blood sugar up to 3 times daily. 06/03/19   Ladell Pier, MD  blood glucose meter kit and supplies KIT Dispense based on patient and insurance preference. Use up to four times daily as directed. (FOR ICD-9 250.00, 250.01). 07/06/19   Robinson, [place] N, PA-C  Blood Glucose Monitoring Suppl (ACCU-CHEK GUIDE ME) w/Device KIT 1 kit by Does not apply route 3 (three) times daily. Use to check blood sugar up to 3 times daily. 06/03/19   Ladell Pier, MD  glucose blood (ACCU-CHEK GUIDE) test strip 4x a day 06/03/19   Ladell Pier, MD  Insulin Pen Needle (B-D ULTRAFINE III SHORT PEN) 31G X 8 MM MISC 1 application by Does not apply route 4 (four) times daily. 05/21/18   Ladell Pier, MD  promethazine (PHENERGAN) 25 MG suppository Place 1 suppository (25 mg total) rectally every 6 (six) hours as needed for nausea or vomiting. 10/01/19   McDonald, Mia A, PA-C    Allergies    Shellfish allergy  Review of Systems   Review of Systems  Constitutional: Negative for chills and fever.  Gastrointestinal: Positive for abdominal pain, nausea and vomiting. Negative for constipation and diarrhea.  Musculoskeletal: Negative for back pain and neck pain.  All other systems reviewed and are negative.   Physical Exam Updated Vital Signs BP (!) 141/78   Pulse 80   Temp 98.1 F (36.7 C) (Oral)   Resp 18   Ht 5' 11.75" (1.822 m)   Wt 99.8 kg   SpO2 99%   BMI 30.05 kg/m   Physical Exam Vitals and nursing note reviewed.  Constitutional:      General: He is not in acute distress.    Appearance: He is well-developed. He is not diaphoretic.  HENT:     Head: Normocephalic and atraumatic.  Eyes:     General: No scleral icterus.       Right eye: No discharge.        Left eye: No discharge.     Conjunctiva/sclera: Conjunctivae normal.  Cardiovascular:     Rate and Rhythm: Normal rate and regular rhythm.  Pulmonary:     Effort: Pulmonary effort is normal. No  respiratory distress.     Breath sounds: No stridor.  Abdominal:     General: There is no distension.     Palpations: Abdomen is soft.     Tenderness: There is no abdominal tenderness.  Musculoskeletal:        General: No deformity.     Cervical back: Normal range of motion.  Skin:    General: Skin is warm and dry.  Neurological:     General: No focal deficit present.     Mental Status: He is alert.     Motor:  No abnormal muscle tone.  Psychiatric:        Mood and Affect: Mood normal.        Behavior: Behavior normal.     ED Results / Procedures / Treatments   Labs (all labs ordered are listed, but only abnormal results are displayed) Labs Reviewed  CBG MONITORING, ED - Abnormal; Notable for the following components:      Result Value   Glucose-Capillary 205 (*)    All other components within normal limits    EKG None  Radiology No results found.  Procedures Procedures (including critical care time)  Medications Ordered in ED Medications  haloperidol lactate (HALDOL) injection 2 mg (2 mg Intravenous Given 10/09/19 1154)  sodium chloride 0.9 % bolus 1,000 mL (0 mLs Intravenous Stopped 10/09/19 1307)    ED Course  I have reviewed the triage vital signs and the nursing notes.  Pertinent labs & imaging results that were available during my care of the patient were reviewed by me and considered in my medical decision making (see chart for details).    MDM Rules/Calculators/A&P                     Patient is a 29 year old man with a past medical history of diabetes who presents today for evaluation of abdominal pain. He reports that prior to arrival he had abdominal pain, consistent with his usual cyclic abdominal pain and vomiting. He does admit to marijuana use 4 days ago. He was given IV Haldol and IV fluids. After that he stated that he felt better and wished to go home. He stated that he did not wish to wait for labs to result or for labs to be run. He did not wish  for any additional work up. He is not markedly hyperglycemic, and has not tachycardic, tachypneic, or febrile. He p.o. challenged without difficulty. Recommended outpatient follow-up.  Return precautions were discussed with patient who states their understanding.  At the time of discharge patient denied any unaddressed complaints or concerns.  Patient is agreeable for discharge home.  Note: Portions of this report may have been transcribed using voice recognition software. Every effort was made to ensure accuracy; however, inadvertent computerized transcription errors may be present   Final Clinical Impression(s) / ED Diagnoses Final diagnoses:  Cyclic vomiting syndrome    Rx / DC Orders ED Discharge Orders    None       Lorin Glass, PA-C 10/09/19 Reader, Ankit, MD 10/09/19 2214

## 2019-10-11 ENCOUNTER — Other Ambulatory Visit: Payer: Self-pay

## 2019-10-11 ENCOUNTER — Emergency Department (HOSPITAL_COMMUNITY)
Admission: EM | Admit: 2019-10-11 | Discharge: 2019-10-11 | Discharge status: Against Medical Advice | Payer: Medicaid Other | Attending: Emergency Medicine | Admitting: Emergency Medicine

## 2019-10-11 DIAGNOSIS — R109 Unspecified abdominal pain: Secondary | ICD-10-CM | POA: Diagnosis present

## 2019-10-11 DIAGNOSIS — Z5321 Procedure and treatment not carried out due to patient leaving prior to being seen by health care provider: Secondary | ICD-10-CM | POA: Insufficient documentation

## 2019-10-11 LAB — COMPREHENSIVE METABOLIC PANEL
ALT: 18 U/L (ref 0–44)
AST: 19 U/L (ref 15–41)
Albumin: 4.3 g/dL (ref 3.5–5.0)
Alkaline Phosphatase: 94 U/L (ref 38–126)
Anion gap: 7 (ref 5–15)
BUN: 11 mg/dL (ref 6–20)
CO2: 28 mmol/L (ref 22–32)
Calcium: 9.4 mg/dL (ref 8.9–10.3)
Chloride: 104 mmol/L (ref 98–111)
Creatinine, Ser: 0.96 mg/dL (ref 0.61–1.24)
GFR calc Af Amer: >60 mL/min (ref >60)
GFR calc non Af Amer: >60 mL/min (ref >60)
Glucose, Bld: 210 mg/dL — ABNORMAL HIGH (ref 70–99)
Potassium: 4.4 mmol/L (ref 3.5–5.1)
Sodium: 139 mmol/L (ref 135–145)
Total Bilirubin: 0.8 mg/dL (ref 0.3–1.2)
Total Protein: 7.5 g/dL (ref 6.5–8.1)

## 2019-10-11 LAB — CBC
HCT: 47.3 % (ref 39.0–52.0)
Hemoglobin: 15.2 g/dL (ref 13.0–17.0)
MCH: 26.9 pg (ref 26.0–34.0)
MCHC: 32.1 g/dL (ref 30.0–36.0)
MCV: 83.6 fL (ref 80.0–100.0)
Platelets: 372 10*3/uL (ref 150–400)
RBC: 5.66 MIL/uL (ref 4.22–5.81)
RDW: 13.5 % (ref 11.5–15.5)
WBC: 14.4 10*3/uL — ABNORMAL HIGH (ref 4.0–10.5)
nRBC: 0 % (ref 0.0–0.2)

## 2019-10-11 LAB — LIPASE, BLOOD: Lipase: 20 U/L (ref 11–51)

## 2019-10-11 MED ORDER — SODIUM CHLORIDE 0.9% FLUSH: 3.0000 mL | Freq: Once | INTRAVENOUS | Status: DC

## 2019-10-11 NOTE — ED Notes (Signed)
Pt refused vitals 

## 2019-10-11 NOTE — ED Triage Notes (Signed)
Pt c/o abd pains that started today that are really bad. Denies any other symptoms.

## 2019-10-11 NOTE — ED Notes (Signed)
Patient notified registration that he was leaving.  

## 2019-10-24 ENCOUNTER — Other Ambulatory Visit: Payer: Self-pay | Admitting: Internal Medicine

## 2019-10-25 ENCOUNTER — Other Ambulatory Visit: Payer: Self-pay | Admitting: Internal Medicine

## 2019-10-25 DIAGNOSIS — N529 Male erectile dysfunction, unspecified: Secondary | ICD-10-CM

## 2019-10-26 MED FILL — NOVOLOG FLEXPEN SYRINGE: 100 | 33 days supply | Qty: 15 | Fill #0

## 2019-10-27 ENCOUNTER — Other Ambulatory Visit: Payer: Self-pay

## 2019-10-27 ENCOUNTER — Ambulatory Visit: Payer: Medicaid Other | Attending: Internal Medicine | Admitting: Internal Medicine

## 2019-11-03 ENCOUNTER — Other Ambulatory Visit: Payer: Self-pay | Admitting: Internal Medicine

## 2019-11-03 DIAGNOSIS — N529 Male erectile dysfunction, unspecified: Secondary | ICD-10-CM

## 2019-11-22 ENCOUNTER — Other Ambulatory Visit: Payer: Self-pay | Admitting: Internal Medicine

## 2019-11-22 DIAGNOSIS — N529 Male erectile dysfunction, unspecified: Secondary | ICD-10-CM

## 2019-11-25 ENCOUNTER — Other Ambulatory Visit: Payer: Self-pay | Admitting: Internal Medicine

## 2019-11-25 DIAGNOSIS — N529 Male erectile dysfunction, unspecified: Secondary | ICD-10-CM

## 2019-12-01 ENCOUNTER — Ambulatory Visit: Payer: Medicaid Other | Attending: Internal Medicine | Admitting: Physician Assistant

## 2019-12-01 ENCOUNTER — Other Ambulatory Visit: Payer: Self-pay

## 2019-12-01 DIAGNOSIS — E1065 Type 1 diabetes mellitus with hyperglycemia: Secondary | ICD-10-CM | POA: Diagnosis not present

## 2019-12-01 DIAGNOSIS — N529 Male erectile dysfunction, unspecified: Secondary | ICD-10-CM

## 2019-12-01 MED ORDER — SILDENAFIL CITRATE 50 MG PO TABS: ORAL_TABLET | ORAL | 0 refills | Status: DC

## 2019-12-01 MED ORDER — BLOOD GLUCOSE MONITOR KIT: PACK | 0 refills | Status: DC

## 2019-12-01 MED ORDER — NOVOLOG FLEXPEN 100 UNIT/ML ~~LOC~~ SOPN: PEN_INJECTOR | SUBCUTANEOUS | 0 refills | Status: DC

## 2019-12-01 MED ORDER — INSULIN GLARGINE 100 UNIT/ML SOLOSTAR PEN: 15.0000 [IU] | PEN_INJECTOR | Freq: Every day | SUBCUTANEOUS | 11 refills | Status: DC

## 2019-12-01 MED FILL — LANTUS SOLOSTAR 100 UNITS/M: 100 | 80 days supply | Qty: 12 | Fill #0

## 2019-12-01 MED FILL — ACCU-CHEK SOFTCLIX LANCETS: 25 days supply | Qty: 100 | Fill #0

## 2019-12-01 MED FILL — NOVOLOG FLEXPEN SYRINGE: 100 | 30 days supply | Qty: 15 | Fill #0

## 2019-12-01 MED FILL — ACCU-CHEK GUIDE TEST STRIP: 25 days supply | Qty: 100 | Fill #0

## 2019-12-01 NOTE — Progress Notes (Signed)
Name and DOB verified  DM f /u  Meds refills Needs a new glucometer

## 2019-12-01 NOTE — Progress Notes (Signed)
Virtual Visit via Telephone Note  I connected with Jonathan Porter on 12/01/19 at  9:10 AM EST by telephone and verified that I am speaking with the correct person using two identifiers.   I discussed the limitations, risks, security and privacy concerns of performing an evaluation and management service by telephone and the availability of in person appointments. I also discussed with the patient that there may be a patient responsible charge related to this service. The patient expressed understanding and agreed to proceed.  PATIENT visit by telephone virtually in the context of Covid-19 pandemic. Patient location:  home My Location:  [place] office Persons on the call:  Me and the patient   History of Present Illness:  Needs med RF.  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  Blood sugars running 150-250 but not checking regularly/needs a new meter.  Needs RF on viagra     Observations/Objective: NAD.  A&Ox3  Assessment and Plan: 1. Type 1 diabetes mellitus with hyperglycemia ([facility]) Not at goal.  Takes meds about 75% of the time.  Encouraged 100% compliance and checking blood sugars.  Will assess control with lab appt at 4pm monday - Hemoglobin A1c; Future - Comprehensive metabolic panel; Future - Lipid panel; Future - insulin aspart (NOVOLOG FLEXPEN) 100 UNIT/ML FlexPen; INJECT 15 UNITS INTO THE SKIN 3 (THREE) TIMES DAILY WITH MEALS.  Dispense: 15 mL; Refill: 0 - insulin glargine (LANTUS) 100 UNIT/ML Solostar Pen; Inject 15 Units into the skin daily.  Dispense: 15 mL; Refill: 11 - blood glucose meter kit and supplies KIT; Dispense based on patient and insurance preference. Use up to four times daily as directed. (FOR ICD-9 250.00, 250.01).  Dispense: 1 each; Refill: 0  2. Erectile dysfunction, unspecified erectile dysfunction type Must come for labs for subsequent RF on viagra - sildenafil (VIAGRA) 50 MG tablet; TAKE ONE TABLET BY MOUTH HALF HOUR PRIOR TO SEXUAL INTERCOURSE  AS NEEDED. LIMIT TO 1 TABLET IN 24 HOURS  Dispense: 10 tablet; Refill: 0  Follow Up Instructions: Predicated on labs   I discussed the assessment and treatment plan with the patient. The patient was provided an opportunity to ask questions and all were answered. The patient agreed with the plan and demonstrated an understanding of the instructions.   The patient was advised to call back or seek an in-person evaluation if the symptoms worsen or if the condition fails to improve as anticipated.  I provided 11 minutes of non-face-to-face time during this encounter.   Freeman Caldron, PA-C  Patient ID: Jonathan Porter, male   DOB: 1991-04-06, 29 y.o.   MRN: [id number]

## 2019-12-20 ENCOUNTER — Telehealth: Payer: Self-pay | Admitting: Internal Medicine

## 2019-12-20 NOTE — Telephone Encounter (Signed)
Refill for 1 month supply was sent on 12/01/19. Refill is too soon to fill.

## 2019-12-20 NOTE — Telephone Encounter (Signed)
1) Medication(s) Requested (by name):sildenafil (VIAGRA) 50 MG tablet    2) Pharmacy of Choice:[facility]   3) Special Requests:   Approved medications will be sent to the pharmacy, we will reach out if there is an issue.  Requests made after 3pm may not be addressed until the following business day!  If a patient is unsure of the name of the medication(s) please note and ask patient to call back when they are able to provide all info, do not send to responsible party until all information is available!

## 2020-01-05 ENCOUNTER — Other Ambulatory Visit: Payer: Self-pay | Admitting: Internal Medicine

## 2020-01-05 MED FILL — NOVOLOG FLEXPEN SYRINGE: 100 | 30 days supply | Qty: 15 | Fill #0

## 2020-01-05 MED FILL — LANTUS SOLOSTAR 100 UNITS/M: 100 | 90 days supply | Qty: 15 | Fill #0

## 2020-01-05 MED FILL — ACCU-CHEK GUIDE TEST STRIP: 25 days supply | Qty: 100 | Fill #0

## 2020-01-05 MED FILL — ACCU-CHEK SOFTCLIX LANCETS: 25 days supply | Qty: 100 | Fill #0

## 2020-01-06 MED FILL — TRUEPLUS PEN NDL 31GX5/16: 31G X 8 MM | 25 days supply | Qty: 100 | Fill #0

## 2020-01-27 ENCOUNTER — Other Ambulatory Visit: Payer: Self-pay | Admitting: Internal Medicine

## 2020-01-27 DIAGNOSIS — N529 Male erectile dysfunction, unspecified: Secondary | ICD-10-CM

## 2020-01-29 IMAGING — US US ABDOMEN LIMITED
1 series · 14 of 25 positions shown · non-contrast
Comparison: None.

CLINICAL DATA: Abdominal pain for 2 days.

EXAM:
ULTRASOUND ABDOMEN LIMITED RIGHT UPPER QUADRANT

[Series 1: us abdomen limited · 0.15mm/px · 14 of 53 slices shown]
[im 1/53]
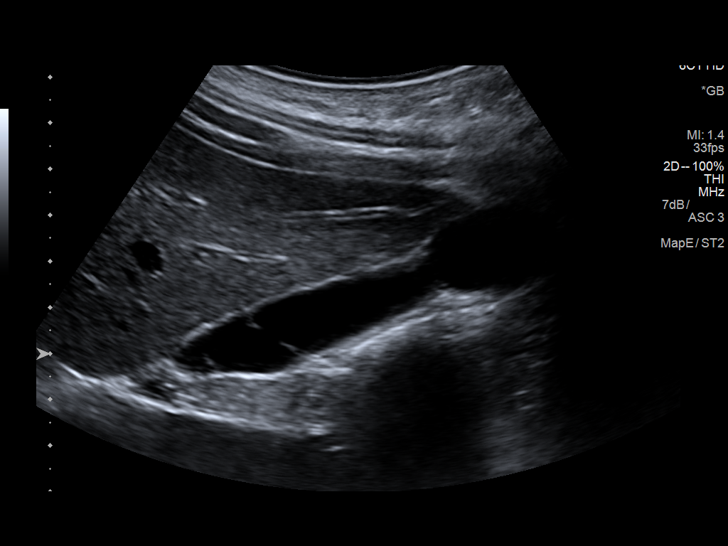
[im 5/53]
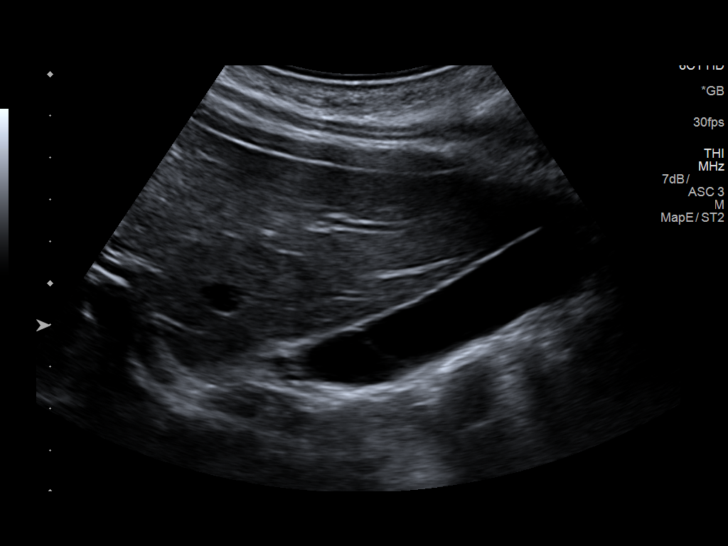
[im 9/53]
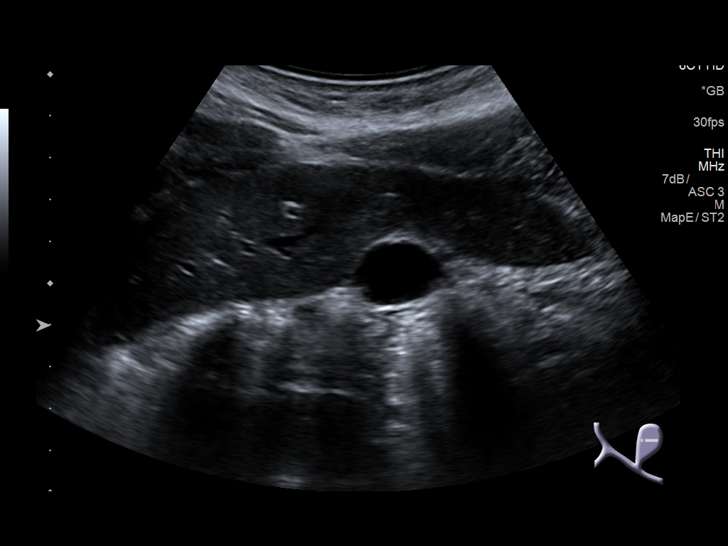
[im 14/53]
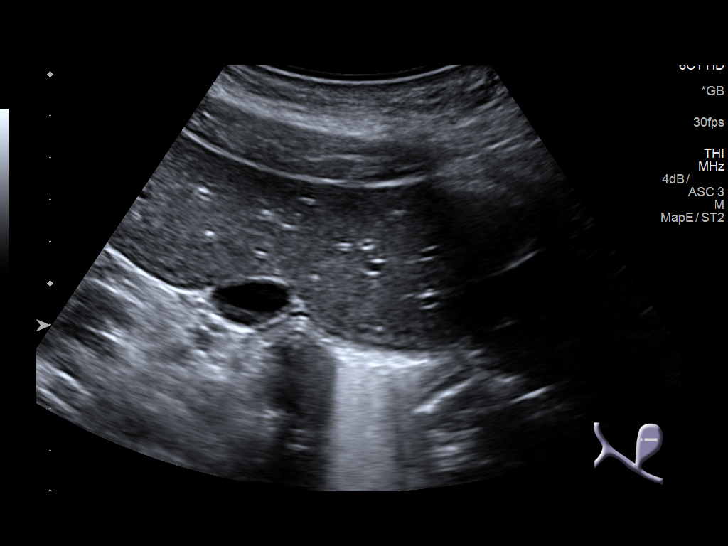
[im 18/53]
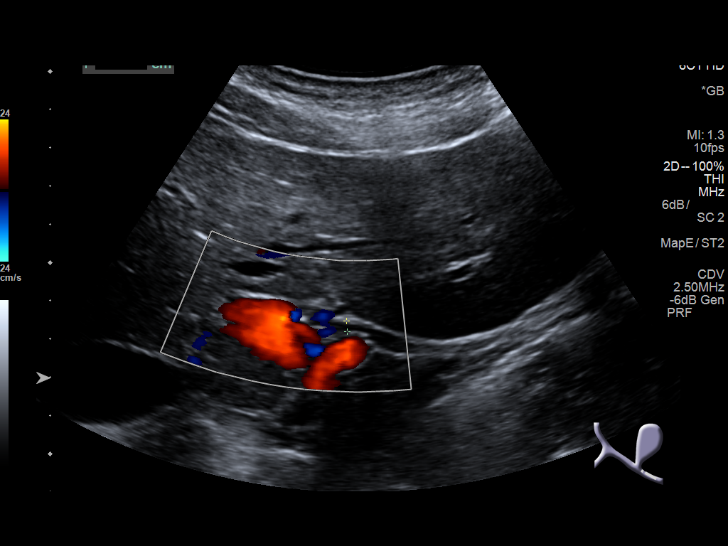
[im 20/53]
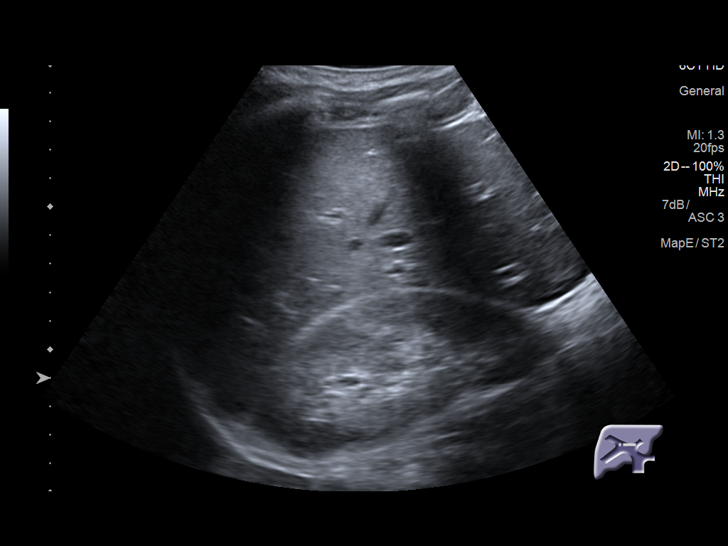
[im 24/53]
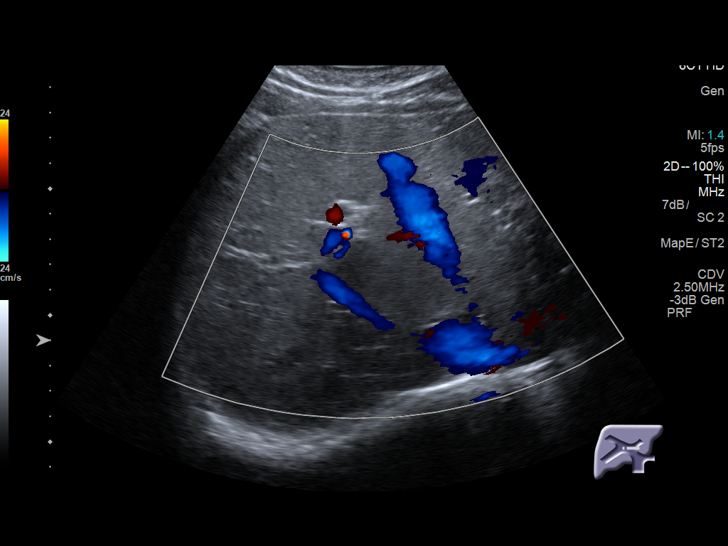
[im 29/53]
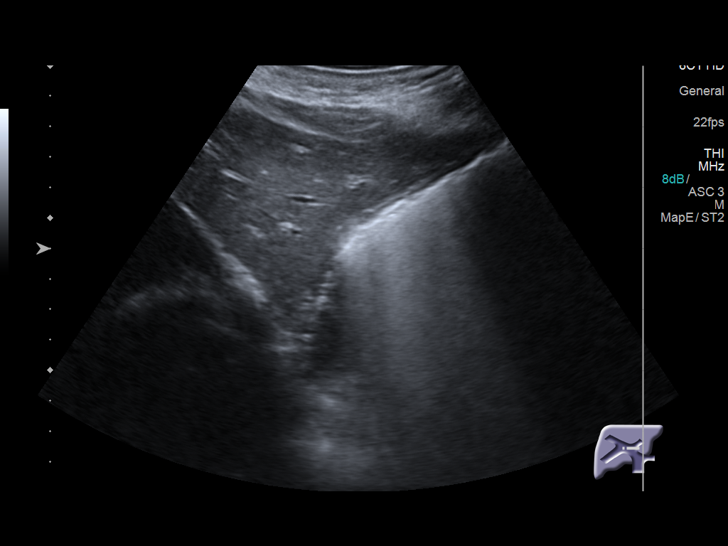
[im 33/53]
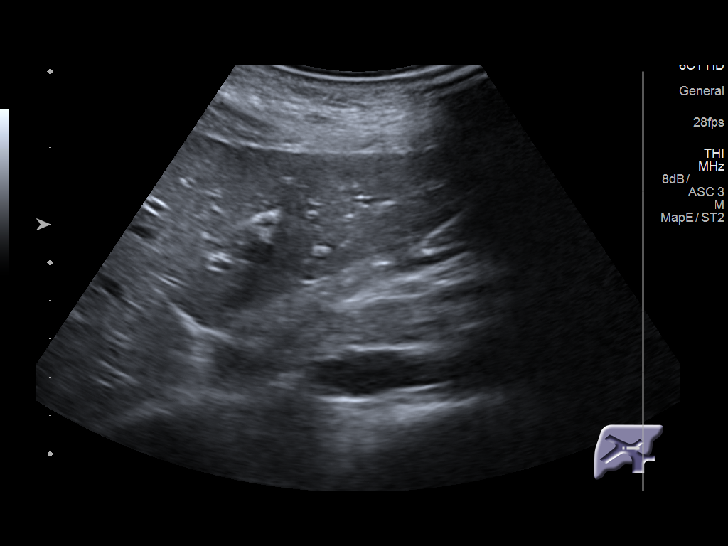
[im 35/53]
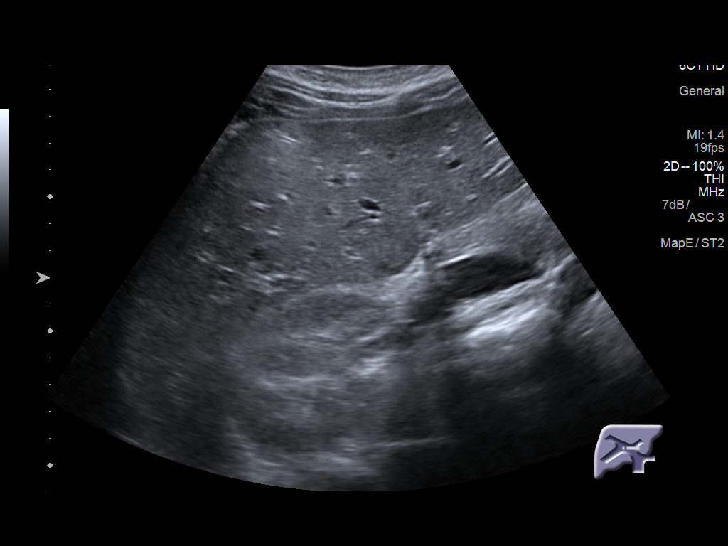
[im 40/53]
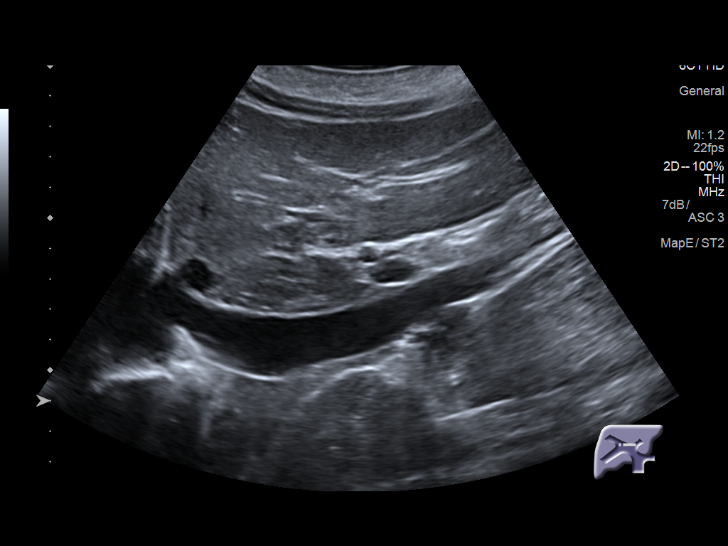
[im 44/53]
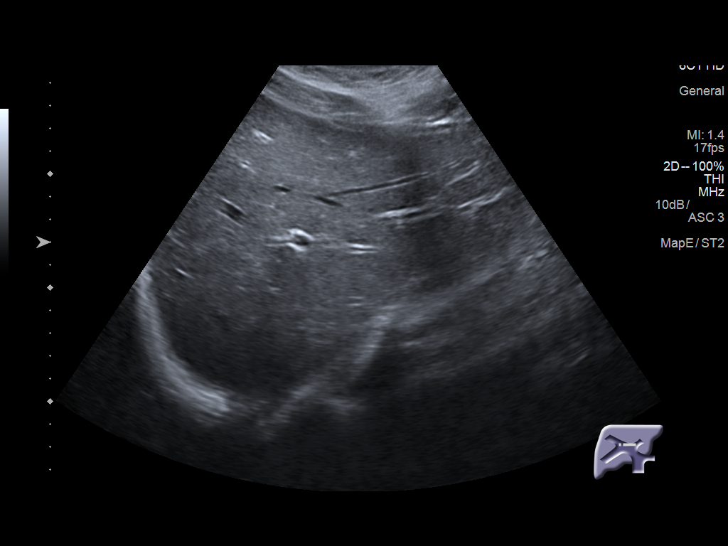
[im 48/53]
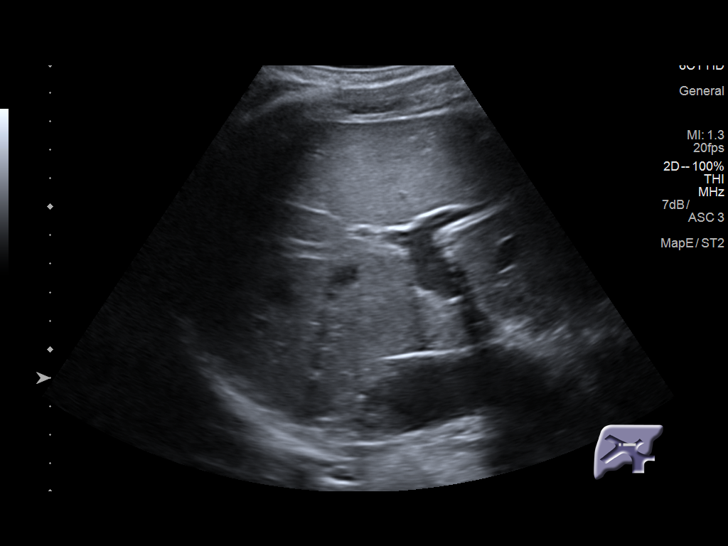
[im 53/53]
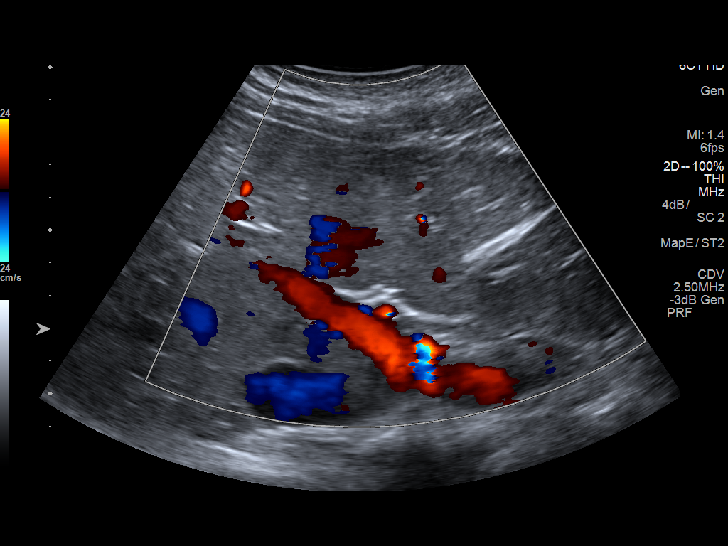

[14 of 25 positions shown; findings below may reference images not displayed]

FINDINGS: Gallbladder:

No gallstones or wall thickening visualized. No sonographic Murphy
sign noted by sonographer.

Common bile duct:

Diameter: 2.8 mm

Liver:

No focal lesion identified. Within normal limits in parenchymal
echogenicity. Portal vein is patent on color Doppler imaging with
normal direction of blood flow towards the liver.
IMPRESSION: Normal right upper quadrant ultrasound.

## 2020-01-31 ENCOUNTER — Ambulatory Visit: Payer: Medicaid Other | Admitting: Family

## 2020-02-19 ENCOUNTER — Other Ambulatory Visit: Payer: Self-pay

## 2020-02-19 ENCOUNTER — Encounter (HOSPITAL_COMMUNITY): Payer: Self-pay | Admitting: Emergency Medicine

## 2020-02-19 ENCOUNTER — Emergency Department (HOSPITAL_COMMUNITY)
Admission: EM | Admit: 2020-02-19 | Discharge: 2020-02-19 | Disposition: A | Discharge status: Home / Self Care | Payer: Medicaid Other | Attending: Emergency Medicine | Admitting: Emergency Medicine

## 2020-02-19 DIAGNOSIS — F17210 Nicotine dependence, cigarettes, uncomplicated: Secondary | ICD-10-CM | POA: Diagnosis not present

## 2020-02-19 DIAGNOSIS — E1043 Type 1 diabetes mellitus with diabetic autonomic (poly)neuropathy: Secondary | ICD-10-CM | POA: Insufficient documentation

## 2020-02-19 DIAGNOSIS — K3184 Gastroparesis: Secondary | ICD-10-CM | POA: Diagnosis not present

## 2020-02-19 DIAGNOSIS — F419 Anxiety disorder, unspecified: Secondary | ICD-10-CM | POA: Diagnosis not present

## 2020-02-19 DIAGNOSIS — R1084 Generalized abdominal pain: Secondary | ICD-10-CM | POA: Diagnosis present

## 2020-02-19 DIAGNOSIS — Z794 Long term (current) use of insulin: Secondary | ICD-10-CM | POA: Diagnosis not present

## 2020-02-19 LAB — CBC
HCT: 45.4 % (ref 39.0–52.0)
Hemoglobin: 14.9 g/dL (ref 13.0–17.0)
MCH: 27.1 pg (ref 26.0–34.0)
MCHC: 32.8 g/dL (ref 30.0–36.0)
MCV: 82.5 fL (ref 80.0–100.0)
Platelets: 316 10*3/uL (ref 150–400)
RBC: 5.5 MIL/uL (ref 4.22–5.81)
RDW: 13.1 % (ref 11.5–15.5)
WBC: 8.6 10*3/uL (ref 4.0–10.5)
nRBC: 0 % (ref 0.0–0.2)

## 2020-02-19 LAB — COMPREHENSIVE METABOLIC PANEL
ALT: 16 U/L (ref 0–44)
AST: 22 U/L (ref 15–41)
Albumin: 4.5 g/dL (ref 3.5–5.0)
Alkaline Phosphatase: 134 U/L — ABNORMAL HIGH (ref 38–126)
Anion gap: 13 (ref 5–15)
BUN: 12 mg/dL (ref 6–20)
CO2: 22 mmol/L (ref 22–32)
Calcium: 9.4 mg/dL (ref 8.9–10.3)
Chloride: 100 mmol/L (ref 98–111)
Creatinine, Ser: 0.97 mg/dL (ref 0.61–1.24)
GFR calc Af Amer: >60 mL/min (ref >60)
GFR calc non Af Amer: >60 mL/min (ref >60)
Glucose, Bld: 297 mg/dL — ABNORMAL HIGH (ref 70–99)
Potassium: 4 mmol/L (ref 3.5–5.1)
Sodium: 135 mmol/L (ref 135–145)
Total Bilirubin: 0.6 mg/dL (ref 0.3–1.2)
Total Protein: 8 g/dL (ref 6.5–8.1)

## 2020-02-19 LAB — CBG MONITORING, ED: Glucose-Capillary: 334 mg/dL — ABNORMAL HIGH (ref 70–99)

## 2020-02-19 LAB — LIPASE, BLOOD: Lipase: 21 U/L (ref 11–51)

## 2020-02-19 MED ORDER — LORAZEPAM 2 MG/ML IJ SOLN
2.0000 mg | Freq: Once | INTRAMUSCULAR | Status: AC
  Administered 2020-02-19: 2 mg via INTRAVENOUS
  Filled 2020-02-19: qty 1

## 2020-02-19 MED ORDER — HALOPERIDOL LACTATE 5 MG/ML IJ SOLN
2.0000 mg | Freq: Once | INTRAMUSCULAR | Status: AC
  Administered 2020-02-19: 2 mg via INTRAVENOUS
  Filled 2020-02-19: qty 1

## 2020-02-19 MED ORDER — SODIUM CHLORIDE 0.9% FLUSH
3.0000 mL | Freq: Once | INTRAVENOUS | Status: AC
  Administered 2020-02-19: 3 mL via INTRAVENOUS

## 2020-02-19 MED ORDER — HALOPERIDOL LACTATE 5 MG/ML IJ SOLN: 5.0000 mg | Freq: Once | INTRAMUSCULAR | Status: DC

## 2020-02-19 MED ORDER — SODIUM CHLORIDE 0.9 % IV BOLUS
2000.0000 mL | Freq: Once | INTRAVENOUS | Status: AC
  Administered 2020-02-19: 2000 mL via INTRAVENOUS

## 2020-02-19 MED ORDER — MORPHINE SULFATE (PF) 4 MG/ML IV SOLN
4.0000 mg | Freq: Once | INTRAVENOUS | Status: AC
  Administered 2020-02-19: 4 mg via INTRAVENOUS
  Filled 2020-02-19: qty 1

## 2020-02-19 NOTE — ED Notes (Signed)
During triage pt continuously pacing room refusing to sit on bed as instructed to obtain vital signs. Pt also continuously pressing call bell stating "I need help and I'm going to lay in this floor" nurse and tech in room at this time. Dr.Allen was notified of pt cursing at EMS and hospital staff and that pt has prior history of aggression.

## 2020-02-19 NOTE — ED Notes (Signed)
After administration of medications pt was assisted into a laying position in the bed with rails rased and stretcher in lowest position. Blanket placed on pt as well.

## 2020-02-19 NOTE — ED Provider Notes (Signed)
[facility] DEPT Provider Note   CSN: [id number] Arrival date & time: 02/19/20  2032     History Chief Complaint  Patient presents with  . Abdominal Pain    Jonathan Porter is a 29 y.o. male.  29 year old male with history of diabetes, gastroparesis, marijuana use who presents with abdominal discomfort x1 day.  Has had nonbilious emesis without fever or chills.  Patient's abdominal discomfort has been diffuse.  I have seen patient for similar symptoms in the past and he becomes very anxious and combative at times.  EMS was called and patient given a milligrams of Zofran IV.  Also given IV fluids and transported here.        Past Medical History:  Diagnosis Date  . Diabetes mellitus without complication ([place])   . Gastroparesis     Patient Active Problem List   Diagnosis Date Noted  . Intractable nausea and vomiting 07/03/2019  . Gastroparesis 07/02/2019  . Influenza vaccine refused 06/03/2019  . Erectile dysfunction 06/03/2019  . Obesity (BMI 30-39.9) 06/03/2019  . Type 1 diabetes mellitus with diabetic polyneuropathy ([place]) 01/21/2019  . Type 1 diabetes mellitus with hyperglycemia ([place]) 01/21/2019  . Paronychia of finger of left hand 01/20/2017  . 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 Diabetes type 1, uncontrolled ([place]) 08/23/2015  . Onychomycosis of toenail 08/23/2015  . Phimosis 08/07/2014    Past Surgical History:  Procedure Laterality Date  . NO PAST SURGERIES         Family History  Problem Relation Age of Onset  . Thyroid disease Mother   . Diabetes Maternal Aunt   . Diabetes Maternal Grandmother   . Diabetes Maternal Grandfather   . Thyroid disease Other     Social History   Tobacco Use  . Smoking status: Current Every Day Smoker    Packs/day: 0.50    Years: 6.00    Pack years: 3.00    Types: Cigarettes  . Smokeless tobacco: Never Used  Substance Use Topics  . Alcohol use: Yes   Comment: social   . Drug use: Yes    Types: Marijuana    Home Medications Prior to Admission medications   Medication Sig Start Date End Date Taking? Authorizing Provider  Accu-Chek FastClix Lancets MISC Use to check blood sugar up to 3 times daily. 06/03/19   Ladell Pier, MD  blood glucose meter kit and supplies KIT Dispense based on patient and insurance preference. Use up to four times daily as directed. (FOR ICD-9 250.00, 250.01). 12/01/19   Argentina Donovan, PA-C  Blood Glucose Monitoring Suppl (ACCU-CHEK GUIDE ME) w/Device KIT 1 kit by Does not apply route 3 (three) times daily. Use to check blood sugar up to 3 times daily. 06/03/19   Ladell Pier, MD  dicyclomine (BENTYL) 20 MG tablet Take 1 tablet (20 mg total) by mouth every 12 (twelve) hours as needed (for abdominal pain/cramping). Patient not taking: Reported on 12/01/2019 09/26/19   Antonietta Breach, PA-C  glucose blood (ACCU-CHEK GUIDE) test strip 4x a day 06/03/19   Ladell Pier, MD  insulin aspart (NOVOLOG FLEXPEN) 100 UNIT/ML FlexPen INJECT 15 UNITS INTO THE SKIN 3 (THREE) TIMES DAILY WITH MEALS. 12/01/19   Argentina Donovan, PA-C  insulin glargine (LANTUS) 100 UNIT/ML Solostar Pen Inject 15 Units into the skin daily. 12/01/19   Argentina Donovan, PA-C  ondansetron (ZOFRAN) 4 MG tablet Take 4 mg by mouth every 8 (eight) hours as  needed for nausea or vomiting.    [provider]  promethazine (PHENERGAN) 25 MG suppository Place 1 suppository (25 mg total) rectally every 6 (six) hours as needed for nausea or vomiting. Patient not taking: Reported on 12/01/2019 10/01/19   McDonald, Mia A, PA-C  PROMETHAZINE HCL PO Take 1 tablet by mouth every 6 (six) hours as needed (nausea/vomitting).    [provider]  sildenafil (VIAGRA) 50 MG tablet TAKE 1 TABLET BY MOUTH HALF HOUR PRIOR TO SEXUAL INTERCOURSE AS NEEDED. LIMIT TO 1 TABLET IN 24 HOURS 01/27/20   Ladell Pier, MD  sucralfate (CARAFATE) 1 g tablet Take 1  tablet (1 g total) by mouth 4 (four) times daily for 14 days. 10/01/19 10/15/19  McDonald, Mia A, PA-C  TRUEPLUS 5-BEVEL PEN NEEDLES 31G X 8 MM MISC USE AS DIRECTED UP TO FOUR TIMES DAILY 01/05/20   Ladell Pier, MD    Allergies    Shellfish allergy  Review of Systems   Review of Systems  All other systems reviewed and are negative.   Physical Exam Updated Vital Signs BP (!) 140/100 (BP Location: Right Arm)   Pulse 72   Temp (!) 97.5 F (36.4 C) (Oral)   Resp 20   Ht 1.829 m (6')   Wt 93 kg   SpO2 98%   BMI 27.80 kg/m   Physical Exam Vitals and nursing note reviewed.  Constitutional:      General: He is not in acute distress.    Appearance: Normal appearance. He is well-developed. He is not toxic-appearing.  HENT:     Head: Normocephalic and atraumatic.  Eyes:     General: Lids are normal.     Conjunctiva/sclera: Conjunctivae normal.     Pupils: Pupils are equal, round, and reactive to light.  Neck:     Thyroid: No thyroid mass.     Trachea: No tracheal deviation.  Cardiovascular:     Rate and Rhythm: Normal rate and regular rhythm.     Heart sounds: Normal heart sounds. No murmur. No gallop.   Pulmonary:     Effort: Pulmonary effort is normal. No respiratory distress.     Breath sounds: Normal breath sounds. No stridor. No decreased breath sounds, wheezing, rhonchi or rales.  Abdominal:     General: Bowel sounds are normal. There is no distension.     Palpations: Abdomen is soft.     Tenderness: There is no abdominal tenderness. There is no rebound.  Musculoskeletal:        General: No tenderness. Normal range of motion.     Cervical back: Normal range of motion and neck supple.  Skin:    General: Skin is warm and dry.     Findings: No abrasion or rash.  Neurological:     Mental Status: He is alert and oriented to person, place, and time.     GCS: GCS eye subscore is 4. GCS verbal subscore is 5. GCS motor subscore is 6.     Cranial Nerves: No cranial nerve  deficit.     Sensory: No sensory deficit.  Psychiatric:        Attention and Perception: Attention normal.        Mood and Affect: Mood is anxious.        Speech: Speech is rapid and pressured.        Behavior: Behavior is aggressive and hyperactive.     ED Results / Procedures / Treatments   Labs (all labs ordered  are listed, but only abnormal results are displayed) Labs Reviewed  LIPASE, BLOOD  COMPREHENSIVE METABOLIC PANEL  CBC  URINALYSIS, ROUTINE W REFLEX MICROSCOPIC    EKG None  Radiology No results found.  Procedures Procedures (including critical care time)  Medications Ordered in ED Medications  sodium chloride flush (NS) 0.9 % injection 3 mL (has no administration in time range)  haloperidol lactate (HALDOL) injection 2 mg (has no administration in time range)  LORazepam (ATIVAN) injection 2 mg (has no administration in time range)  morphine 4 MG/ML injection 4 mg (has no administration in time range)  sodium chloride 0.9 % bolus 2,000 mL (has no administration in time range)    ED Course  I have reviewed the triage vital signs and the nursing notes.  Pertinent labs & imaging results that were available during my care of the patient were reviewed by me and considered in my medical decision making (see chart for details).    MDM Rules/Calculators/A&P                      Patient medicated here and given IV fluids and feels better.  Labs are reassuring.  Will discharge home Final Clinical Impression(s) / ED Diagnoses Final diagnoses:  None    Rx / DC Orders ED Discharge Orders    None       Lacretia Leigh, MD 02/19/20 2233

## 2020-02-19 NOTE — ED Notes (Signed)
Delay on PT triage vitals due to PT unable to sit on bed. PT walking around room state pain is too much. PT would not stop ringing the call bell while Clinical research associate and RN are in room. PT state he need a doctor now. Staff info PT RN need to complete triage while PT continue to hit call light

## 2020-02-19 NOTE — ED Notes (Signed)
PT given urinal to obtain urine sample.

## 2020-02-19 NOTE — ED Triage Notes (Signed)
Pt presents by [facility] for evaluation of nausea, vomiting, and diarrhea for the last 3 days. Pt has hx of DM. EMS reports improvement in vomiting with 8mg  Zofran IVP. NS given prior to arrival.

## 2020-02-21 ENCOUNTER — Ambulatory Visit: Payer: Medicaid Other | Admitting: Internal Medicine

## 2020-02-23 ENCOUNTER — Emergency Department (HOSPITAL_COMMUNITY)
Admission: EM | Admit: 2020-02-23 | Discharge: 2020-02-23 | Disposition: A | Discharge status: Home / Self Care | Payer: Medicaid Other | Attending: Emergency Medicine | Admitting: Emergency Medicine

## 2020-02-23 ENCOUNTER — Other Ambulatory Visit: Payer: Self-pay

## 2020-02-23 ENCOUNTER — Encounter (HOSPITAL_COMMUNITY): Payer: Self-pay | Admitting: Emergency Medicine

## 2020-02-23 DIAGNOSIS — R112 Nausea with vomiting, unspecified: Secondary | ICD-10-CM | POA: Insufficient documentation

## 2020-02-23 DIAGNOSIS — R1084 Generalized abdominal pain: Secondary | ICD-10-CM | POA: Diagnosis not present

## 2020-02-23 DIAGNOSIS — E1065 Type 1 diabetes mellitus with hyperglycemia: Secondary | ICD-10-CM | POA: Diagnosis not present

## 2020-02-23 DIAGNOSIS — F17210 Nicotine dependence, cigarettes, uncomplicated: Secondary | ICD-10-CM | POA: Insufficient documentation

## 2020-02-23 DIAGNOSIS — R197 Diarrhea, unspecified: Secondary | ICD-10-CM | POA: Insufficient documentation

## 2020-02-23 DIAGNOSIS — E1042 Type 1 diabetes mellitus with diabetic polyneuropathy: Secondary | ICD-10-CM | POA: Diagnosis not present

## 2020-02-23 LAB — CBC
HCT: 41 % (ref 39.0–52.0)
Hemoglobin: 13.7 g/dL (ref 13.0–17.0)
MCH: 27.2 pg (ref 26.0–34.0)
MCHC: 33.4 g/dL (ref 30.0–36.0)
MCV: 81.5 fL (ref 80.0–100.0)
Platelets: 288 10*3/uL (ref 150–400)
RBC: 5.03 MIL/uL (ref 4.22–5.81)
RDW: 13.5 % (ref 11.5–15.5)
WBC: 6.6 10*3/uL (ref 4.0–10.5)
nRBC: 0 % (ref 0.0–0.2)

## 2020-02-23 LAB — COMPREHENSIVE METABOLIC PANEL
ALT: 16 U/L (ref 0–44)
AST: 19 U/L (ref 15–41)
Albumin: 4 g/dL (ref 3.5–5.0)
Alkaline Phosphatase: 111 U/L (ref 38–126)
Anion gap: 10 (ref 5–15)
BUN: 12 mg/dL (ref 6–20)
CO2: 23 mmol/L (ref 22–32)
Calcium: 8.8 mg/dL — ABNORMAL LOW (ref 8.9–10.3)
Chloride: 103 mmol/L (ref 98–111)
Creatinine, Ser: 0.88 mg/dL (ref 0.61–1.24)
GFR calc Af Amer: >60 mL/min (ref >60)
GFR calc non Af Amer: >60 mL/min (ref >60)
Glucose, Bld: 318 mg/dL — ABNORMAL HIGH (ref 70–99)
Potassium: 3.9 mmol/L (ref 3.5–5.1)
Sodium: 136 mmol/L (ref 135–145)
Total Bilirubin: 0.9 mg/dL (ref 0.3–1.2)
Total Protein: 7 g/dL (ref 6.5–8.1)

## 2020-02-23 LAB — CBG MONITORING, ED: Glucose-Capillary: 303 mg/dL — ABNORMAL HIGH (ref 70–99)

## 2020-02-23 LAB — LIPASE, BLOOD: Lipase: 23 U/L (ref 11–51)

## 2020-02-23 MED ORDER — DICYCLOMINE HCL 20 MG PO TABS
20.0000 mg | ORAL_TABLET | Freq: Three times a day (TID) | ORAL | 0 refills | Status: DC | PRN
Start: 2020-02-23 — End: 2020-08-06

## 2020-02-23 MED ORDER — METOCLOPRAMIDE HCL 10 MG PO TABS
10.0000 mg | ORAL_TABLET | Freq: Three times a day (TID) | ORAL | 0 refills | Status: DC | PRN
Start: 2020-02-23 — End: 2020-08-06

## 2020-02-23 MED ORDER — SODIUM CHLORIDE 0.9% FLUSH: 3.0000 mL | Freq: Once | INTRAVENOUS | Status: DC

## 2020-02-23 MED ORDER — METOCLOPRAMIDE HCL 5 MG/ML IJ SOLN
10.0000 mg | Freq: Once | INTRAMUSCULAR | Status: AC
  Administered 2020-02-23: 10 mg via INTRAVENOUS
  Filled 2020-02-23: qty 2

## 2020-02-23 MED ORDER — SODIUM CHLORIDE 0.9 % IV BOLUS
1000.0000 mL | Freq: Once | INTRAVENOUS | Status: AC
  Administered 2020-02-23: 1000 mL via INTRAVENOUS

## 2020-02-23 NOTE — ED Provider Notes (Signed)
[facility] DEPT Provider Note   CSN: [id number] Arrival date & time: 02/23/20  9628     History Chief Complaint  Patient presents with  . Abdominal Pain  . Emesis    Jonathan Porter is a 29 y.o. male with a history T1DM & gastroparesis who presents to the ED with complaints of emesis and abdominal discomfort that returned this morning.  Patient states he has a longstanding history of nausea, vomiting, and abdominal pain, states he had a flare on 4 days ago which seemed to improve following his ER visit, however returned this morning.  He states he has had 2 episodes of emesis with several episodes of dry heaves with associated generalized abdominal cramping.  Noted an episode of somewhat loose stool.  No alleviating or aggravating factors.  no intervention prior to arrival.  He denies fever, chills, hematemesis, melena, hematochezia, dysuria, polyuria, polydipsia, chest pain, or dyspnea.  HPI     Past Medical History:  Diagnosis Date  . Diabetes mellitus without complication ([place])   . Gastroparesis     Patient Active Problem List   Diagnosis Date Noted  . Intractable nausea and vomiting 07/03/2019  . Gastroparesis 07/02/2019  . Influenza vaccine refused 06/03/2019  . Erectile dysfunction 06/03/2019  . Obesity (BMI 30-39.9) 06/03/2019  . Type 1 diabetes mellitus with diabetic polyneuropathy ([place]) 01/21/2019  . Type 1 diabetes mellitus with hyperglycemia ([place]) 01/21/2019  . Paronychia of finger of left hand 01/20/2017  . 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 Diabetes type 1, uncontrolled ([place]) 08/23/2015  . Onychomycosis of toenail 08/23/2015  . Phimosis 08/07/2014    Past Surgical History:  Procedure Laterality Date  . NO PAST SURGERIES         Family History  Problem Relation Age of Onset  . Thyroid disease Mother   . Diabetes Maternal Aunt   . Diabetes Maternal Grandmother   . Diabetes Maternal Grandfather    . Thyroid disease Other     Social History   Tobacco Use  . Smoking status: Current Every Day Smoker    Packs/day: 0.50    Years: 6.00    Pack years: 3.00    Types: Cigarettes  . Smokeless tobacco: Never Used  Substance Use Topics  . Alcohol use: Yes    Comment: social   . Drug use: Yes    Types: Marijuana    Home Medications Prior to Admission medications   Medication Sig Start Date End Date Taking? Authorizing Provider  Accu-Chek FastClix Lancets MISC Use to check blood sugar up to 3 times daily. 06/03/19   Ladell Pier, MD  blood glucose meter kit and supplies KIT Dispense based on patient and insurance preference. Use up to four times daily as directed. (FOR ICD-9 250.00, 250.01). 12/01/19   Argentina Donovan, PA-C  Blood Glucose Monitoring Suppl (ACCU-CHEK GUIDE ME) w/Device KIT 1 kit by Does not apply route 3 (three) times daily. Use to check blood sugar up to 3 times daily. 06/03/19   Ladell Pier, MD  dicyclomine (BENTYL) 20 MG tablet Take 1 tablet (20 mg total) by mouth every 12 (twelve) hours as needed (for abdominal pain/cramping). Patient not taking: Reported on 12/01/2019 09/26/19   Antonietta Breach, PA-C  glucose blood (ACCU-CHEK GUIDE) test strip 4x a day 06/03/19   Ladell Pier, MD  insulin aspart (NOVOLOG FLEXPEN) 100 UNIT/ML FlexPen INJECT 15 UNITS INTO THE SKIN 3 (THREE) TIMES DAILY WITH MEALS. 12/01/19  Freeman Caldron M, PA-C  insulin glargine (LANTUS) 100 UNIT/ML Solostar Pen Inject 15 Units into the skin daily. 12/01/19   Argentina Donovan, PA-C  ondansetron (ZOFRAN) 4 MG tablet Take 4 mg by mouth every 8 (eight) hours as needed for nausea or vomiting.    [provider]  promethazine (PHENERGAN) 25 MG suppository Place 1 suppository (25 mg total) rectally every 6 (six) hours as needed for nausea or vomiting. Patient not taking: Reported on 12/01/2019 10/01/19   McDonald, Mia A, PA-C  PROMETHAZINE HCL PO Take 1 tablet by mouth every 6 (six) hours  as needed (nausea/vomitting).    [provider]  sildenafil (VIAGRA) 50 MG tablet TAKE 1 TABLET BY MOUTH HALF HOUR PRIOR TO SEXUAL INTERCOURSE AS NEEDED. LIMIT TO 1 TABLET IN 24 HOURS 01/27/20   Ladell Pier, MD  sucralfate (CARAFATE) 1 g tablet Take 1 tablet (1 g total) by mouth 4 (four) times daily for 14 days. 10/01/19 10/15/19  McDonald, Mia A, PA-C  TRUEPLUS 5-BEVEL PEN NEEDLES 31G X 8 MM MISC USE AS DIRECTED UP TO FOUR TIMES DAILY 01/05/20   Ladell Pier, MD    Allergies    Shellfish allergy  Review of Systems   Review of Systems  Constitutional: Negative for chills and fever.  Respiratory: Negative for shortness of breath.   Cardiovascular: Negative for chest pain.  Gastrointestinal: Positive for abdominal pain, diarrhea, nausea and vomiting. Negative for anal bleeding, blood in stool and constipation.  Endocrine: Negative for polydipsia and polyuria.  Genitourinary: Negative for dysuria.  Neurological: Negative for syncope.  All other systems reviewed and are negative.   Physical Exam Updated Vital Signs BP 133/87   Pulse 74   Temp 98.2 F (36.8 C)   Resp 15   SpO2 100%   Physical Exam Vitals and nursing note reviewed.  Constitutional:      General: He is not in acute distress.    Appearance: He is well-developed. He is not toxic-appearing.  HENT:     Head: Normocephalic and atraumatic.  Eyes:     General:        Right eye: No discharge.        Left eye: No discharge.     Conjunctiva/sclera: Conjunctivae normal.  Cardiovascular:     Rate and Rhythm: Normal rate and regular rhythm.  Pulmonary:     Effort: Pulmonary effort is normal. No respiratory distress.     Breath sounds: Normal breath sounds. No wheezing, rhonchi or rales.  Abdominal:     General: There is no distension.     Palpations: Abdomen is soft.     Tenderness: There is no abdominal tenderness. There is no guarding or rebound.  Musculoskeletal:     Cervical back: Neck supple.   Skin:    General: Skin is warm and dry.     Findings: No rash.  Neurological:     Mental Status: He is alert.     Comments: Clear speech.   Psychiatric:        Behavior: Behavior normal.    ED Results / Procedures / Treatments   Labs (all labs ordered are listed, but only abnormal results are displayed) Labs Reviewed  COMPREHENSIVE METABOLIC PANEL - Abnormal; Notable for the following components:      Result Value   Glucose, Bld 318 (*)    Calcium 8.8 (*)    All other components within normal limits  LIPASE, BLOOD  CBC  URINALYSIS, ROUTINE W REFLEX  MICROSCOPIC  CBG MONITORING, ED    EKG None  Radiology No results found.  Procedures Procedures (including critical care time)  Medications Ordered in ED Medications  sodium chloride flush (NS) 0.9 % injection 3 mL (has no administration in time range)    ED Course  I have reviewed the triage vital signs and the nursing notes.  Pertinent labs & imaging results that were available during my care of the patient were reviewed by me and considered in my medical decision making (see chart for details).  Jonathan Porter was evaluated in Emergency Department on 02/23/2020 for the symptoms described in the history of present illness. He/she was evaluated in the context of the global COVID-19 pandemic, which necessitated consideration that the patient might be at risk for infection with the SARS-CoV-2 virus that causes COVID-19. Institutional protocols and algorithms that pertain to the evaluation of patients at risk for COVID-19 are in a state of rapid change based on information released by regulatory bodies including the CDC and federal and state organizations. These policies and algorithms were followed during the patient's care in the ED.    MDM Rules/Calculators/A&P                     Patient presents to the ED with complaints of nausea, vomiting, and abdominal discomfort similar to prior.  He is nontoxic, resting  comfortably, vitals without significant abnormality.  His abdomen is nontender without peritoneal signs.  Additional history obtained:  Additional history obtained from chart & nursing note review, several visits for similar in the past.  EKG: NSR, QTc 430-personally reviewed and interpreted. Lab Tests:  I Ordered, reviewed, and interpreted labs, which included:  CBC: No anemia or leukocytosis. CMP: Hyperglycemia at 318 without acidosis or anion gap elevation to raise concern for DKA.  No significant electrolyte derangement.  LFTs within normal limits. Urinalysis: Pending  ED Course:  Following initial assessment of the patient I have ordered fluids and Reglan for symptomatic management.  08:45: Patient informed nursing staff that he needed to leave the emergency department due to a personal emergency.  He has not had any vomiting in the department.  His abdomen remains without peritoneal signs, do not suspect acute surgical process such as cholecystitis, appendicitis, perforation, or obstruction.  Labs do not show findings concerning for DKA.  Will discharge home with Reglan and Bentyl. I discussed results, treatment plan, need for follow-up, and return precautions with the patient. Provided opportunity for questions, patient confirmed understanding and is in agreement with plan.   Portions of this note were generated with [employer]. Dictation errors may occur despite best attempts at proofreading.   Final Clinical Impression(s) / ED Diagnoses Final diagnoses:  Non-intractable vomiting with nausea, unspecified vomiting type    Rx / DC Orders ED Discharge Orders         Ordered    metoCLOPramide (REGLAN) 10 MG tablet  Every 8 hours PRN     02/23/20 0849    dicyclomine (BENTYL) 20 MG tablet  Every 8 hours PRN     02/23/20 0849           Amaryllis Dyke, PA-C 02/23/20 0854    Lucrezia Starch, MD 02/24/20 [phone]

## 2020-02-23 NOTE — ED Triage Notes (Signed)
Pt reports having abd problems for years but this week when this episode started. Repots cramping, nausea and dry heaving.

## 2020-02-23 NOTE — Discharge Instructions (Addendum)
You were seen in the emergency department today for nausea, vomiting, and abdominal pain.  Your labs were overall reassuring, however your blood sugar was elevated, please be sure to monitor this at home and take your medications for your diabetes.  We are sending you home with Reglan to take every 8 hours as needed for nausea and vomiting and Bentyl to take every 8 hours as needed for abdominal cramping.  We have prescribed you new medication(s) today. Discuss the medications prescribed today with your pharmacist as they can have adverse effects and interactions with your other medicines including over the counter and prescribed medications. Seek medical evaluation if you start to experience new or abnormal symptoms after taking one of these medicines, seek care immediately if you start to experience difficulty breathing, feeling of your throat closing, facial swelling, or rash as these could be indications of a more serious allergic reaction  Please follow-up with your primary care provider within 3 days.  Return to the ER for new or worsening symptoms including but not limited to inability to keep fluids down, blood in your vomit, blood in your stool, increased pain, increased thirst/urination, or any other concerns.

## 2020-02-23 NOTE — ED Notes (Signed)
Pt requesting to leave due to family emergency.  EDP Sam made aware.

## 2020-02-23 NOTE — ED Notes (Signed)
Discharge paperwork and prescriptions reviewed with pt.  Pt with no questions or concerns at this time, ambulatory at time of discharge.

## 2020-03-01 ENCOUNTER — Emergency Department (HOSPITAL_COMMUNITY)
Admission: EM | Admit: 2020-03-01 | Discharge: 2020-03-01 | Discharge status: Against Medical Advice | Payer: Medicaid Other | Attending: Emergency Medicine | Admitting: Emergency Medicine

## 2020-03-01 ENCOUNTER — Encounter (HOSPITAL_COMMUNITY): Payer: Self-pay | Admitting: Emergency Medicine

## 2020-03-01 DIAGNOSIS — Z8639 Personal history of other endocrine, nutritional and metabolic disease: Secondary | ICD-10-CM

## 2020-03-01 DIAGNOSIS — E1040 Type 1 diabetes mellitus with diabetic neuropathy, unspecified: Secondary | ICD-10-CM | POA: Insufficient documentation

## 2020-03-01 DIAGNOSIS — Z5320 Procedure and treatment not carried out because of patient's decision for unspecified reasons: Secondary | ICD-10-CM | POA: Insufficient documentation

## 2020-03-01 DIAGNOSIS — R1084 Generalized abdominal pain: Secondary | ICD-10-CM | POA: Insufficient documentation

## 2020-03-01 DIAGNOSIS — F17210 Nicotine dependence, cigarettes, uncomplicated: Secondary | ICD-10-CM | POA: Diagnosis not present

## 2020-03-01 DIAGNOSIS — Z794 Long term (current) use of insulin: Secondary | ICD-10-CM | POA: Insufficient documentation

## 2020-03-01 DIAGNOSIS — R1110 Vomiting, unspecified: Secondary | ICD-10-CM | POA: Diagnosis not present

## 2020-03-01 DIAGNOSIS — F1210 Cannabis abuse, uncomplicated: Secondary | ICD-10-CM | POA: Insufficient documentation

## 2020-03-01 MED ORDER — MORPHINE SULFATE (PF) 4 MG/ML IV SOLN
4.0000 mg | Freq: Once | INTRAVENOUS | Status: DC
  Filled 2020-03-01: qty 1

## 2020-03-01 MED ORDER — HALOPERIDOL LACTATE 5 MG/ML IJ SOLN
2.0000 mg | Freq: Once | INTRAMUSCULAR | Status: AC
  Administered 2020-03-01: 2 mg via INTRAMUSCULAR
  Filled 2020-03-01: qty 1

## 2020-03-01 MED ORDER — SODIUM CHLORIDE 0.9 % IV BOLUS: 1000.0000 mL | Freq: Once | INTRAVENOUS | Status: DC

## 2020-03-01 MED ORDER — LORAZEPAM 2 MG/ML IJ SOLN
2.0000 mg | Freq: Once | INTRAMUSCULAR | Status: DC
  Filled 2020-03-01: qty 1

## 2020-03-01 NOTE — ED Provider Notes (Signed)
[facility] DEPT Provider Note   CSN: [id number] Arrival date & time: 03/01/20  1902    History Chief Complaint  Patient presents with  . Abdominal Pain    Jonathan Porter is a 29 y.o. male with past medical history significant for diabetes, gastroparesis who presents for evaluation of generalized abdominal pain and emesis.  States he does occasionally use marijuana.  States pain similar to his prior gastroparesis episodes.  Rates his pain a 9/10.  Located to generalized abdomen.  Multiple episodes of NBNB emesis.  Patient states Haldol and Ativan typically work.  Pain present x24 hours.  His blood sugars at home have been running in the 190s.  Has not attempted any medication at home.  Denies fever, chills, chest pain, shortness of breath, dysuria, diarrhea, constipation.  Denies additional aggravating or relieving factors.  History obtained from patient and past medical records.  No interpreter is used.  HPI     Past Medical History:  Diagnosis Date  . Diabetes mellitus without complication ([place])   . Gastroparesis     Patient Active Problem List   Diagnosis Date Noted  . Intractable nausea and vomiting 07/03/2019  . Gastroparesis 07/02/2019  . Influenza vaccine refused 06/03/2019  . Erectile dysfunction 06/03/2019  . Obesity (BMI 30-39.9) 06/03/2019  . Type 1 diabetes mellitus with diabetic polyneuropathy ([place]) 01/21/2019  . Type 1 diabetes mellitus with hyperglycemia ([place]) 01/21/2019  . Paronychia of finger of left hand 01/20/2017  . 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 Diabetes type 1, uncontrolled ([place]) 08/23/2015  . Onychomycosis of toenail 08/23/2015  . Phimosis 08/07/2014    Past Surgical History:  Procedure Laterality Date  . NO PAST SURGERIES         Family History  Problem Relation Age of Onset  . Thyroid disease Mother   . Diabetes Maternal Aunt   . Diabetes Maternal Grandmother   . Diabetes  Maternal Grandfather   . Thyroid disease Other     Social History   Tobacco Use  . Smoking status: Current Every Day Smoker    Packs/day: 0.50    Years: 6.00    Pack years: 3.00    Types: Cigarettes  . Smokeless tobacco: Never Used  Vaping Use  . Vaping Use: Never used  Substance Use Topics  . Alcohol use: Yes    Comment: social   . Drug use: Yes    Types: Marijuana    Home Medications Prior to Admission medications   Medication Sig Start Date End Date Taking? Authorizing Provider  Accu-Chek FastClix Lancets MISC Use to check blood sugar up to 3 times daily. 06/03/19   Ladell Pier, MD  blood glucose meter kit and supplies KIT Dispense based on patient and insurance preference. Use up to four times daily as directed. (FOR ICD-9 250.00, 250.01). 12/01/19   Argentina Donovan, PA-C  Blood Glucose Monitoring Suppl (ACCU-CHEK GUIDE ME) w/Device KIT 1 kit by Does not apply route 3 (three) times daily. Use to check blood sugar up to 3 times daily. 06/03/19   Ladell Pier, MD  dicyclomine (BENTYL) 20 MG tablet Take 1 tablet (20 mg total) by mouth every 8 (eight) hours as needed for spasms. 02/23/20   Petrucelli, Samantha R, PA-C  glucose blood (ACCU-CHEK GUIDE) test strip 4x a day 06/03/19   Ladell Pier, MD  insulin aspart (NOVOLOG FLEXPEN) 100 UNIT/ML FlexPen INJECT 15 UNITS INTO THE SKIN 3 (THREE) TIMES DAILY  WITH MEALS. Patient taking differently: Inject 15 Units into the skin 3 (three) times daily with meals.  12/01/19   Argentina Donovan, PA-C  insulin glargine (LANTUS) 100 UNIT/ML Solostar Pen Inject 15 Units into the skin daily. Patient taking differently: Inject 15 Units into the skin at bedtime.  12/01/19   Argentina Donovan, PA-C  metoCLOPramide (REGLAN) 10 MG tablet Take 1 tablet (10 mg total) by mouth every 8 (eight) hours as needed for nausea. 02/23/20   Petrucelli, Samantha R, PA-C  sildenafil (VIAGRA) 50 MG tablet TAKE 1 TABLET BY MOUTH HALF HOUR PRIOR TO SEXUAL  INTERCOURSE AS NEEDED. LIMIT TO 1 TABLET IN 24 HOURS Patient taking differently: Take 50 mg by mouth daily as needed for erectile dysfunction. TAKE 1 TABLET BY MOUTH HALF HOUR PRIOR TO SEXUAL INTERCOURSE AS NEEDED. LIMIT TO 1 TABLET IN 24 HOURS 01/27/20   Ladell Pier, MD  TRUEPLUS 5-BEVEL PEN NEEDLES 31G X 8 MM MISC USE AS DIRECTED UP TO FOUR TIMES DAILY 01/05/20   Ladell Pier, MD  PROMETHAZINE HCL PO Take 1 tablet by mouth every 6 (six) hours as needed (nausea/vomitting).  02/23/20  [provider]  sucralfate (CARAFATE) 1 g tablet Take 1 tablet (1 g total) by mouth 4 (four) times daily for 14 days. Patient not taking: Reported on 02/23/2020 10/01/19 02/23/20  McDonald, Mia A, PA-C    Allergies    Shellfish allergy  Review of Systems   Review of Systems  Constitutional: Negative.   HENT: Negative.   Respiratory: Negative.   Cardiovascular: Negative.   Gastrointestinal: Positive for abdominal pain, nausea and vomiting. Negative for abdominal distention, anal bleeding, blood in stool, constipation, diarrhea and rectal pain.  Endocrine: Negative for polydipsia and polyuria.  Genitourinary: Negative.   Musculoskeletal: Negative.   Skin: Negative.   Neurological: Negative.   All other systems reviewed and are negative.   Physical Exam Updated Vital Signs BP (!) 146/97   Pulse 73   Resp 18   Ht 6' (1.829 m)   Wt 93 kg   SpO2 97%   BMI 27.80 kg/m   Physical Exam Vitals and nursing note reviewed.  Constitutional:      General: He is not in acute distress.    Appearance: He is well-developed. He is not ill-appearing, toxic-appearing or diaphoretic.     Comments: Pacing around room.  HENT:     Head: Atraumatic.     Mouth/Throat:     Mouth: Mucous membranes are moist.  Eyes:     Pupils: Pupils are equal, round, and reactive to light.  Cardiovascular:     Rate and Rhythm: Normal rate and regular rhythm.     Heart sounds: Normal heart sounds.  Pulmonary:      Effort: Pulmonary effort is normal. No respiratory distress.     Breath sounds: Normal breath sounds.     Comments: Clear to auscultation bilaterally.  Speaks in full sentences without difficulty.  No Kussmaul breathing. Abdominal:     General: Bowel sounds are normal. There is no distension.     Palpations: Abdomen is soft.     Tenderness: There is generalized abdominal tenderness. There is no right CVA tenderness, left CVA tenderness, guarding or rebound. Negative signs include Murphy's sign and McBurney's sign.     Hernia: No hernia is present.     Comments: Soft, nontender however difficult to assess.  Musculoskeletal:        General: Normal range of motion.  Cervical back: Normal range of motion and neck supple.  Skin:    General: Skin is warm and dry.  Neurological:     Mental Status: He is alert.     Comments: Speaks in full sentences without difficulty.  Ambulatory without ataxic gait.    ED Results / Procedures / Treatments   Labs (all labs ordered are listed, but only abnormal results are displayed) Labs Reviewed  CBC WITH DIFFERENTIAL/PLATELET  COMPREHENSIVE METABOLIC PANEL  LIPASE, BLOOD  CBG MONITORING, ED    EKG None  Radiology No results found.  Procedures Procedures (including critical care time)  Medications Ordered in ED Medications  sodium chloride 0.9 % bolus 1,000 mL (has no administration in time range)  morphine 4 MG/ML injection 4 mg (has no administration in time range)  LORazepam (ATIVAN) injection 2 mg (has no administration in time range)  haloperidol lactate (HALDOL) injection 2 mg (2 mg Intramuscular Given 03/01/20 1933)    ED Course  I have reviewed the triage vital signs and the nursing notes.  Pertinent labs & imaging results that were available during my care of the patient were reviewed by me and considered in my medical decision making (see chart for details).  29 year old male presents for evaluation of generalized abdominal  pain and emesis.  States he does use THC daily.  He is afebrile, nonseptic, non-ill-appearing.  Prior to my evaluation patient had tried to flip the bed per nursing.  I evaluated patient.  He was able to be verbally deescalated.  He was able to lie flat on the bed.  His abdomen is soft with some generalized tenderness however no focal tenderness.  He is ambulatory in room without difficulty.  Requesting Haldol and Ativan which she states typically works for his emesis.  States his blood sugars have been 190s at home.  Plan on labs, imaging and reassess  Notified by nursing that patient verbally and physically aggressive with nursing staff. He is refusing blood work and any additional treatment. I tried to verbally de-escalate patient however patient states "I am calling my f** ride and I am getting the hell out of here."  Patient refuses any further treatment. He will leave [place].  We discussed the nature and purpose, risks and benefits, as well as, the alternatives of treatment. Time was given to allow the opportunity to ask questions and consider their options, and after the discussion, the patient decided to refuse the offerred treatment. The patient was informed that refusal could lead to, but was not limited to, death, permanent disability, or severe pain. If present, I asked the relatives or significant others to dissuade them without success. Prior to refusing, I determined that the patient had the capacity to make their decision and understood the consequences of that decision. After refusal, I made every reasonable opportunity to treat them to the best of my ability.  The patient was notified that they may return to the emergency department at any time for further treatment.      MDM Rules/Calculators/A&P                           Final Clinical Impression(s) / ED Diagnoses Final diagnoses:  Generalized abdominal pain  Vomiting, intractability of vomiting not specified,  presence of nausea not specified, unspecified vomiting type  Hx of diabetes mellitus  Hx of diabetic gastroparesis    Rx / DC Orders ED Discharge Orders    None  Nettie Elm, PA-C 03/01/20 2010    Gareth Morgan, MD 03/02/20 [phone]

## 2020-03-01 NOTE — ED Triage Notes (Signed)
Pt c/o abd pain that started today. Pt was able to ambulate to ambulance. Pt is walking around in room and unable to sit to be assessed.

## 2020-03-01 NOTE — ED Notes (Signed)
Went to pt room due to pt continually pushing call bell. Explained to pt that we could not help him if we were answering his call bell continually. Pt stated he would just call 911 from his cell phone. Explained to pt that was unacceptable pt called 911 anways.

## 2020-03-01 NOTE — ED Notes (Signed)
Pt ambulated out of the ED with security. He is on the phone, asking someone to pick him up between cursing and threatening staff.

## 2020-03-01 NOTE — ED Notes (Addendum)
Pt ambulating out of room under his own power. Screaming and threatening to assault staff. Ripped gloves and blood pressure cuffs out of there containers and threw them in the floor. He knocked everything off of the counter. Security called and en route.

## 2020-03-01 NOTE — ED Notes (Signed)
Pt lifted bed off floor, very aggressive, unable to get vitals nor CBG

## 2020-03-01 NOTE — ED Notes (Signed)
Attempting to deescalate patient. He is aware that medication is ordered, but he must sit down and allow [place] to start an IV. He refuses to comply. Staff getting his medication ready.

## 2020-03-01 NOTE — ED Notes (Signed)
Pt called 911 from room

## 2020-03-29 ENCOUNTER — Other Ambulatory Visit: Payer: Self-pay

## 2020-03-29 ENCOUNTER — Other Ambulatory Visit: Payer: Self-pay | Admitting: Internal Medicine

## 2020-03-29 DIAGNOSIS — N529 Male erectile dysfunction, unspecified: Secondary | ICD-10-CM

## 2020-03-29 MED ORDER — SILDENAFIL CITRATE 50 MG PO TABS: ORAL_TABLET | ORAL | 2 refills | Status: DC

## 2020-03-29 NOTE — Telephone Encounter (Signed)
sildenafil (VIAGRA) 50 MG tablet  [facility] [place], [place] - [address] Phone:  [phone]  Fax:  [phone]

## 2020-03-29 NOTE — Telephone Encounter (Signed)
Requested medication (s) are due for refill today: yes  Requested medication (s) are on the active medication list: yes  Last refill:  01/27/20  10 tabs 2 refills  Future visit scheduled: No  Notes to clinic:  BP was elevated 146/96. Hospital encounter. Could not find another recent BP.    Requested Prescriptions  Pending Prescriptions Disp Refills   sildenafil (VIAGRA) 50 MG tablet 10 tablet 2    Sig: TAKE 1 TABLET BY MOUTH HALF HOUR PRIOR TO SEXUAL INTERCOURSE AS NEEDED. LIMIT TO 1 TABLET IN 24 HOURS      Urology: Erectile Dysfunction Agents Failed - 03/29/2020  9:10 AM      Failed - Last BP in normal range    BP Readings from Last 1 Encounters:  03/01/20 (!) 146/97          Passed - Valid encounter within last 12 months    Recent Outpatient Visits           3 months ago Type 1 diabetes mellitus with hyperglycemia [facility])   [facility] [place], [place], [place]   10 months ago Diabetes mellitus type 1, uncontrolled, without complications [facility])   [place] [facility] Marcine Matar, MD   1 year ago Dry skin   [facility] [place], [place], [place]   1 year ago Diabetes mellitus type 1, uncontrolled, without complications [facility])    [facility] Marcine Matar, MD   1 year ago Back pain of lumbosacral region with sciatica   [facility] [place], Shea Stakes, NP

## 2020-04-10 ENCOUNTER — Other Ambulatory Visit: Payer: Self-pay

## 2020-04-10 ENCOUNTER — Telehealth: Payer: Self-pay | Admitting: Internal Medicine

## 2020-04-10 DIAGNOSIS — E1065 Type 1 diabetes mellitus with hyperglycemia: Secondary | ICD-10-CM

## 2020-04-10 MED ORDER — INSULIN GLARGINE 100 UNIT/ML SOLOSTAR PEN: 15.0000 [IU] | PEN_INJECTOR | Freq: Every day | SUBCUTANEOUS | 0 refills | Status: DC

## 2020-04-10 MED ORDER — INSULIN GLARGINE 100 UNIT/ML SOLOSTAR PEN: 15.0000 [IU] | PEN_INJECTOR | Freq: Every day | SUBCUTANEOUS | 11 refills | Status: DC

## 2020-04-10 MED ORDER — NOVOLOG FLEXPEN 100 UNIT/ML ~~LOC~~ SOPN: PEN_INJECTOR | SUBCUTANEOUS | 0 refills | Status: DC

## 2020-04-10 MED FILL — LANTUS SOLOSTAR 100 UNITS/M: 100 | 90 days supply | Qty: 15 | Fill #0

## 2020-04-10 MED FILL — NOVOLOG FLEXPEN SYRINGE: 100 | 33 days supply | Qty: 15 | Fill #0

## 2020-04-10 NOTE — Telephone Encounter (Signed)
Medication has been sent to the correct pharmacy per pt request

## 2020-04-10 NOTE — Telephone Encounter (Signed)
Medication Refill - Medication: insulin aspart (NOVOLOG FLEXPEN) 100 UNIT/ML FlexPen insulin glargine (LANTUS) 100 UNIT/ML Solostar Pen   Has the patient contacted their pharmacy? No. (Agent: If no, request that the patient contact the pharmacy for the refill.) (Agent: If yes, when and what did the pharmacy advise?)  Preferred Pharmacy (with phone number or street name):  [facility] - [place], [place] - [place] [address]  [address] [place] [address]  Phone: [phone] Fax: [phone]     Agent: Please be advised that RX refills may take up to 3 business days. We ask that you follow-up with your pharmacy.

## 2020-04-10 NOTE — Telephone Encounter (Signed)
Patient called to say that Rx sent in today was sent to the wrong pharmacy say that they need to be sent to the   [facility] - [place], [place] - [place] [address] Phone:  [phone]  Fax:  [phone]     Please call patient after Rx is sent in need them today insulin aspart (NOVOLOG FLEXPEN) 100 UNIT/ML FlexPen, insulin glargine (LANTUS) 100 UNIT/ML Solostar Pen

## 2020-04-13 ENCOUNTER — Telehealth: Payer: Self-pay | Admitting: Internal Medicine

## 2020-04-13 NOTE — Telephone Encounter (Signed)
Patient was called regarding his  oncerns    Copied from CRM [id number]. Topic: General - Other >> Apr 13, 2020  8:05 AM Wyonia Hough E wrote: Reason for CRM: [facility]/ advised Pt they showed they were sent to [facility] pharmacy on 7.20.21/ Please call pt to confirm if Pharmacy was corrected and sent to [facility] pharmacy

## 2020-05-30 ENCOUNTER — Other Ambulatory Visit: Payer: Self-pay | Admitting: Internal Medicine

## 2020-05-30 DIAGNOSIS — N529 Male erectile dysfunction, unspecified: Secondary | ICD-10-CM

## 2020-05-30 NOTE — Telephone Encounter (Signed)
Medication Refill - Medication: sildenafil (VIAGRA) 50 MG tablet     Preferred Pharmacy (with phone number or street name):  [facility] - [place], [place] - [address] Phone:  [phone]  Fax:  [phone]       Agent: Please be advised that RX refills may take up to 3 business days. We ask that you follow-up with your pharmacy.

## 2020-06-07 ENCOUNTER — Other Ambulatory Visit: Payer: Self-pay | Admitting: Internal Medicine

## 2020-06-07 ENCOUNTER — Telehealth: Payer: Self-pay | Admitting: Internal Medicine

## 2020-06-07 DIAGNOSIS — N529 Male erectile dysfunction, unspecified: Secondary | ICD-10-CM

## 2020-06-07 DIAGNOSIS — E1065 Type 1 diabetes mellitus with hyperglycemia: Secondary | ICD-10-CM

## 2020-06-07 MED ORDER — INSULIN GLARGINE 100 UNIT/ML SOLOSTAR PEN: 15.0000 [IU] | PEN_INJECTOR | Freq: Every day | SUBCUTANEOUS | 0 refills | Status: DC

## 2020-06-07 MED ORDER — NOVOLOG FLEXPEN 100 UNIT/ML ~~LOC~~ SOPN: PEN_INJECTOR | SUBCUTANEOUS | 0 refills | Status: DC

## 2020-06-07 MED ORDER — SILDENAFIL CITRATE 50 MG PO TABS: ORAL_TABLET | ORAL | 2 refills | Status: DC

## 2020-06-07 NOTE — Telephone Encounter (Signed)
Requested medication (s) are due for refill today: yes   Requested medication (s) are on the active medication list: yes    Future visit scheduled: no  Notes to clinic:  spoke with patient and he is scheduling a office visit    Requested Prescriptions  Pending Prescriptions Disp Refills   sildenafil (VIAGRA) 50 MG tablet 10 tablet 2    Sig: TAKE 1 TABLET BY MOUTH HALF HOUR PRIOR TO SEXUAL INTERCOURSE AS NEEDED. LIMIT TO 1 TABLET IN 24 HOURS      Urology: Erectile Dysfunction Agents Failed - 06/07/2020  2:11 PM      Failed - Last BP in normal range    BP Readings from Last 1 Encounters:  03/01/20 (!) 146/97          Passed - Valid encounter within last 12 months    Recent Outpatient Visits           6 months ago Type 1 diabetes mellitus with hyperglycemia [facility])   [place] [place], [place], [place]   1 year ago Diabetes mellitus type 1, uncontrolled, without complications [facility])   [place] [facility] Marcine Matar, MD   1 year ago Dry skin   [facility] [place], [place], [place]   1 year ago Diabetes mellitus type 1, uncontrolled, without complications [facility])   [place] [facility] Jonah Blue B, MD   1 year ago Back pain of lumbosacral region with sciatica   [facility] [place], [place] W, NP                insulin aspart (NOVOLOG FLEXPEN) 100 UNIT/ML FlexPen 15 mL 0    Sig: INJECT 15 UNITS INTO THE SKIN 3 (THREE) TIMES DAILY WITH MEALS.      Endocrinology:  Diabetes - Insulins Failed - 06/07/2020  2:11 PM      Failed - HBA1C is between 0 and 7.9 and within 180 days    HbA1c, POC (controlled diabetic range)  Date Value Ref Range Status  06/03/2019 8.0 (A) 0.0 - 7.0 % Final   Hgb A1c MFr Bld  Date Value Ref Range Status  07/03/2019 8.6 (H) 4.8 - 5.6 % Final    Comment:    (NOTE) Pre diabetes:          5.7%-6.4% Diabetes:               >6.4% Glycemic control for   <7.0% adults with diabetes           Failed - Valid encounter within last 6 months    Recent Outpatient Visits           6 months ago Type 1 diabetes mellitus with hyperglycemia [facility])   [place] [facility] [place], [place], [place]   1 year ago Diabetes mellitus type 1, uncontrolled, without complications [facility])   [place] [facility] Marcine Matar, MD   1 year ago Dry skin   [facility] [place], [place], [place]   1 year ago Diabetes mellitus type 1, uncontrolled, without complications [facility])   [place] [facility] Marcine Matar, MD   1 year ago Back pain of lumbosacral region with sciatica   [facility] [place], Shea Stakes, NP  insulin glargine (LANTUS) 100 UNIT/ML Solostar Pen 15 mL 0    Sig: Inject 15 Units into the skin daily.      Endocrinology:  Diabetes - Insulins Failed - 06/07/2020  2:11 PM      Failed - HBA1C is between 0 and 7.9 and within 180 days    HbA1c, POC (controlled diabetic range)  Date Value Ref Range Status  06/03/2019 8.0 (A) 0.0 - 7.0 % Final   Hgb A1c MFr Bld  Date Value Ref Range Status  07/03/2019 8.6 (H) 4.8 - 5.6 % Final    Comment:    (NOTE) Pre diabetes:          5.7%-6.4% Diabetes:              >6.4% Glycemic control for   <7.0% adults with diabetes           Failed - Valid encounter within last 6 months    Recent Outpatient Visits           6 months ago Type 1 diabetes mellitus with hyperglycemia [facility])   [place] [facility] [place], [place], [place]   1 year ago Diabetes mellitus type 1, uncontrolled, without complications [facility])    [facility] Marcine Matar, MD   1 year ago Dry skin   [facility] [place], [place], [place]   1 year ago Diabetes mellitus type 1,  uncontrolled, without complications [facility])   [place] [facility] Marcine Matar, MD   1 year ago Back pain of lumbosacral region with sciatica   [facility] [place], Shea Stakes, NP

## 2020-06-07 NOTE — Telephone Encounter (Signed)
Medication Refill - Medication: insulin aspart (NOVOLOG FLEXPEN) 100 UNIT/ML FlexPen  insulin glargine (LANTUS) 100 UNIT/ML Solostar Pen  sildenafil (VIAGRA) 50 MG tablet  Has the patient contacted their pharmacy? Yes.   (Agent: If no, request that the patient contact the pharmacy for the refill.) (Agent: If yes, when and what did the pharmacy advise?)  Preferred Pharmacy (with phone number or street name):  [facility] - [place], [place] - [place] [address]  [address] [place] [address]  Phone: [phone] Fax: [phone]    Agent: Please be advised that RX refills may take up to 3 business days. We ask that you follow-up with your pharmacy.

## 2020-06-07 NOTE — Telephone Encounter (Signed)
Medication Refill - Medication: insulin aspart (NOVOLOG FLEXPEN) 100 UNIT/ML FlexPen [facility]) and sildenafil (VIAGRA) 50 MG tablet ([facility]    Has the patient contacted their pharmacy?Yes (Agent: If no, request that the patient contact the pharmacy for the refill.) (Agent: If yes, when and what did the pharmacy advise?)Contact PCP  Preferred Pharmacy (with phone number or street name):  [facility] [place], [place] - [place] [address] Phone:  [phone]  Fax:  [phone]     [facility] - [place], [place] - [address] Phone:  [phone]  Fax:  [phone]       Agent: Please be advised that RX refills may take up to 3 business days. We ask that you follow-up with your pharmacy.

## 2020-06-08 MED FILL — NOVOLOG FLEXPEN SYRINGE: 100 | 33 days supply | Qty: 15 | Fill #0

## 2020-06-19 ENCOUNTER — Other Ambulatory Visit: Payer: Self-pay | Admitting: Internal Medicine

## 2020-06-19 DIAGNOSIS — N529 Male erectile dysfunction, unspecified: Secondary | ICD-10-CM

## 2020-06-19 MED ORDER — SILDENAFIL CITRATE 50 MG PO TABS: ORAL_TABLET | ORAL | 0 refills | Status: DC

## 2020-06-19 NOTE — Telephone Encounter (Signed)
Medication Refill - Medication: sildenafil (VIAGRA) 50 MG tablet [[id number]]      Preferred Pharmacy (with phone number or street name):  [facility] - [place], [place] - [address]  [address] [place] [address]  Phone: [phone] Fax: [phone]  Hours: Not open 24 hours     Agent: Please be advised that RX refills may take up to 3 business days. We ask that you follow-up with your pharmacy.

## 2020-06-19 NOTE — Telephone Encounter (Signed)
Change in pharmacy

## 2020-07-19 ENCOUNTER — Other Ambulatory Visit: Payer: Self-pay | Admitting: Internal Medicine

## 2020-07-19 DIAGNOSIS — E1065 Type 1 diabetes mellitus with hyperglycemia: Secondary | ICD-10-CM

## 2020-07-19 MED FILL — NOVOLOG FLEXPEN SYRINGE: 100 | 33 days supply | Qty: 15 | Fill #0

## 2020-07-19 NOTE — Telephone Encounter (Signed)
Requested Prescriptions  Pending Prescriptions Disp Refills   NOVOLOG FLEXPEN 100 UNIT/ML FlexPen [Pharmacy Med Name: NOVOLOG FLEXPEN SYRINGE 100 Solution Pen-injector] 15 mL 0    Sig: INJECT 15 UNITS INTO THE SKIN 3 (THREE) TIMES DAILY WITH MEALS.     Endocrinology:  Diabetes - Insulins Failed - 07/19/2020 10:14 AM      Failed - HBA1C is between 0 and 7.9 and within 180 days    HbA1c, POC (controlled diabetic range)  Date Value Ref Range Status  06/03/2019 8.0 (A) 0.0 - 7.0 % Final   Hgb A1c MFr Bld  Date Value Ref Range Status  07/03/2019 8.6 (H) 4.8 - 5.6 % Final    Comment:    (NOTE) Pre diabetes:          5.7%-6.4% Diabetes:              >6.4% Glycemic control for   <7.0% adults with diabetes          Failed - Valid encounter within last 6 months    Recent Outpatient Visits          7 months ago Type 1 diabetes mellitus with hyperglycemia [facility])   [facility] [facility] [place], [place], [place]   1 year ago Diabetes mellitus type 1, uncontrolled, without complications [facility])   [place] [facility] Marcine Matar, MD   1 year ago Dry skin   [facility] [place], [place], [place]   1 year ago Diabetes mellitus type 1, uncontrolled, without complications [facility])   [place] [facility] Marcine Matar, MD   1 year ago Back pain of lumbosacral region with sciatica   [facility] [place], Shea Stakes, NP      Future Appointments            In 1 week Marcine Matar, MD [facility]

## 2020-07-30 ENCOUNTER — Ambulatory Visit: Payer: Medicaid Other | Admitting: Internal Medicine

## 2020-07-31 ENCOUNTER — Other Ambulatory Visit: Payer: Self-pay | Admitting: Internal Medicine

## 2020-07-31 DIAGNOSIS — N529 Male erectile dysfunction, unspecified: Secondary | ICD-10-CM

## 2020-07-31 DIAGNOSIS — E1065 Type 1 diabetes mellitus with hyperglycemia: Secondary | ICD-10-CM

## 2020-07-31 NOTE — Telephone Encounter (Signed)
Medication Refill - Medication: NOVOLOG FLEXPEN 100 UNIT/ML FlexPen ,insulin glargine (LANTUS) 100 UNIT/ML Solostar Pen    Has the patient contacted their pharmacy?yes (Agent: If no, request that the patient contact the pharmacy for the refill.) (Agent: If yes, when and what did the pharmacy advise?)Contact PCP  Preferred Pharmacy (with phone number or street name):  [facility] - [place], [place] - [place] [address] Phone:  [phone]  Fax:  [phone]       Agent: Please be advised that RX refills may take up to 3 business days. We ask that you follow-up with your pharmacy.

## 2020-07-31 NOTE — Telephone Encounter (Signed)
Requested medication (s) are due for refill today: Yes  Requested medication (s) are on the active medication list: Yes  Last refill:  07/19/20  Future visit scheduled: No  Notes to clinic:  Unclear if pt. Had visit 07/30/20.    Requested Prescriptions  Pending Prescriptions Disp Refills   insulin aspart (NOVOLOG FLEXPEN) 100 UNIT/ML FlexPen 15 mL 0      Endocrinology:  Diabetes - Insulins Failed - 07/31/2020 10:34 AM      Failed - HBA1C is between 0 and 7.9 and within 180 days    HbA1c, POC (controlled diabetic range)  Date Value Ref Range Status  06/03/2019 8.0 (A) 0.0 - 7.0 % Final   Hgb A1c MFr Bld  Date Value Ref Range Status  07/03/2019 8.6 (H) 4.8 - 5.6 % Final    Comment:    (NOTE) Pre diabetes:          5.7%-6.4% Diabetes:              >6.4% Glycemic control for   <7.0% adults with diabetes           Failed - Valid encounter within last 6 months    Recent Outpatient Visits           8 months ago Type 1 diabetes mellitus with hyperglycemia [facility])   [place] [facility] [place], [place], [place]   1 year ago Diabetes mellitus type 1, uncontrolled, without complications [facility])   [facility] Marcine Matar, MD   1 year ago Dry skin   [facility] [place], [place], [place]   1 year ago Diabetes mellitus type 1, uncontrolled, without complications [facility])   [place] [facility] Wellness Jonah Blue B, MD   1 year ago Back pain of lumbosacral region with sciatica   [facility] [place], [place] W, NP                insulin glargine (LANTUS) 100 UNIT/ML Solostar Pen 15 mL 0    Sig: Inject 15 Units into the skin daily.      Endocrinology:  Diabetes - Insulins Failed - 07/31/2020 10:34 AM      Failed - HBA1C is between 0 and 7.9 and within 180 days    HbA1c, POC (controlled diabetic range)  Date Value Ref Range Status  06/03/2019 8.0 (A)  0.0 - 7.0 % Final   Hgb A1c MFr Bld  Date Value Ref Range Status  07/03/2019 8.6 (H) 4.8 - 5.6 % Final    Comment:    (NOTE) Pre diabetes:          5.7%-6.4% Diabetes:              >6.4% Glycemic control for   <7.0% adults with diabetes           Failed - Valid encounter within last 6 months    Recent Outpatient Visits           8 months ago Type 1 diabetes mellitus with hyperglycemia [facility])   [place] [facility] [place], [place], [place]   1 year ago Diabetes mellitus type 1, uncontrolled, without complications [facility])   [facility] Marcine Matar, MD   1 year ago Dry skin   [facility] [place], [place], [place]   1 year ago Diabetes mellitus type 1, uncontrolled, without  complications [facility])   [place] [facility] Wellness Marcine Matar, MD   1 year ago Back pain of lumbosacral region with sciatica   [facility] Claiborne Rigg, NP

## 2020-08-06 ENCOUNTER — Encounter (HOSPITAL_COMMUNITY): Payer: Self-pay

## 2020-08-06 ENCOUNTER — Emergency Department (HOSPITAL_COMMUNITY)
Admission: EM | Admit: 2020-08-06 | Discharge: 2020-08-06 | Disposition: A | Discharge status: Home / Self Care | Payer: Medicaid Other | Attending: Emergency Medicine | Admitting: Emergency Medicine

## 2020-08-06 ENCOUNTER — Other Ambulatory Visit: Payer: Self-pay

## 2020-08-06 DIAGNOSIS — F17210 Nicotine dependence, cigarettes, uncomplicated: Secondary | ICD-10-CM | POA: Insufficient documentation

## 2020-08-06 DIAGNOSIS — R112 Nausea with vomiting, unspecified: Secondary | ICD-10-CM | POA: Diagnosis not present

## 2020-08-06 DIAGNOSIS — R1084 Generalized abdominal pain: Secondary | ICD-10-CM | POA: Diagnosis not present

## 2020-08-06 DIAGNOSIS — E1065 Type 1 diabetes mellitus with hyperglycemia: Secondary | ICD-10-CM | POA: Insufficient documentation

## 2020-08-06 DIAGNOSIS — Z794 Long term (current) use of insulin: Secondary | ICD-10-CM | POA: Insufficient documentation

## 2020-08-06 LAB — COMPREHENSIVE METABOLIC PANEL
ALT: 19 U/L (ref 0–44)
AST: 18 U/L (ref 15–41)
Albumin: 4.5 g/dL (ref 3.5–5.0)
Alkaline Phosphatase: 119 U/L (ref 38–126)
Anion gap: 14 (ref 5–15)
BUN: 9 mg/dL (ref 6–20)
CO2: 21 mmol/L — ABNORMAL LOW (ref 22–32)
Calcium: 9.8 mg/dL (ref 8.9–10.3)
Chloride: 99 mmol/L (ref 98–111)
Creatinine, Ser: 0.95 mg/dL (ref 0.61–1.24)
GFR, Estimated: >60 mL/min (ref >60)
Glucose, Bld: 322 mg/dL — ABNORMAL HIGH (ref 70–99)
Potassium: 4.2 mmol/L (ref 3.5–5.1)
Sodium: 134 mmol/L — ABNORMAL LOW (ref 135–145)
Total Bilirubin: 0.8 mg/dL (ref 0.3–1.2)
Total Protein: 8.7 g/dL — ABNORMAL HIGH (ref 6.5–8.1)

## 2020-08-06 LAB — CBC
HCT: 46.9 % (ref 39.0–52.0)
Hemoglobin: 15.4 g/dL (ref 13.0–17.0)
MCH: 26.5 pg (ref 26.0–34.0)
MCHC: 32.8 g/dL (ref 30.0–36.0)
MCV: 80.7 fL (ref 80.0–100.0)
Platelets: 307 10*3/uL (ref 150–400)
RBC: 5.81 MIL/uL (ref 4.22–5.81)
RDW: 13.3 % (ref 11.5–15.5)
WBC: 8.2 10*3/uL (ref 4.0–10.5)
nRBC: 0 % (ref 0.0–0.2)

## 2020-08-06 LAB — LIPASE, BLOOD: Lipase: 20 U/L (ref 11–51)

## 2020-08-06 LAB — ETHANOL: Alcohol, Ethyl (B): <10 mg/dL (ref <10)

## 2020-08-06 MED ORDER — DICYCLOMINE HCL 10 MG PO CAPS
20.0000 mg | ORAL_CAPSULE | Freq: Once | ORAL | Status: AC
  Administered 2020-08-06: 20 mg via ORAL
  Filled 2020-08-06: qty 2

## 2020-08-06 MED ORDER — METOCLOPRAMIDE HCL 10 MG PO TABS: 10.0000 mg | ORAL_TABLET | Freq: Three times a day (TID) | ORAL | 0 refills | Status: DC | PRN

## 2020-08-06 MED ORDER — HALOPERIDOL LACTATE 5 MG/ML IJ SOLN
5.0000 mg | Freq: Once | INTRAMUSCULAR | Status: AC
  Administered 2020-08-06: 5 mg via INTRAMUSCULAR
  Filled 2020-08-06: qty 1

## 2020-08-06 MED ORDER — DICYCLOMINE HCL 20 MG PO TABS: 20.0000 mg | ORAL_TABLET | Freq: Three times a day (TID) | ORAL | 0 refills | Status: DC | PRN

## 2020-08-06 MED FILL — NOVOLOG FLEXPEN SYRINGE: 100 | 33 days supply | Qty: 15 | Fill #0

## 2020-08-06 MED FILL — LANTUS SOLOSTAR 100 UNITS/M: 100 | 90 days supply | Qty: 15 | Fill #0

## 2020-08-06 NOTE — ED Provider Notes (Signed)
[facility] DEPT Provider Note   CSN: [id number] Arrival date & time: 08/06/20  1630     History Chief Complaint  Patient presents with  . Abdominal Pain    Jonathan Porter is a 29 y.o. male.  HPI Patient is a 29 year old male presented today with diffuse abdominal pain nausea and vomiting worse since 6 AM.  He states that he has not had symptoms like this intermittently over the past several years.  He states that he does not this to control his blood sugar.  He states that he is taking all his medications as prescribed.  He does endorse daily marijuana use.  He states he has been able to keep down food and drink however he came to the ER because of the pain.  He states it feels achy and constant worse with touch which is similar to prior episodes of the same symptoms.  He has no other associate symptoms.  No fevers no chills, no diarrhea or constipation, no chest pain or shortness of breath.  I reviewed the EMR it appears the patient had a normal gastric emptying study done 07/12/2019 Has also had several contrast abdominal CT scan.     Past Medical History:  Diagnosis Date  . Diabetes mellitus without complication ([place])   . Gastroparesis     Patient Active Problem List   Diagnosis Date Noted  . Intractable nausea and vomiting 07/03/2019  . Gastroparesis 07/02/2019  . Influenza vaccine refused 06/03/2019  . Erectile dysfunction 06/03/2019  . Obesity (BMI 30-39.9) 06/03/2019  . Type 1 diabetes mellitus with diabetic polyneuropathy ([place]) 01/21/2019  . Type 1 diabetes mellitus with hyperglycemia ([place]) 01/21/2019  . Paronychia of finger of left hand 01/20/2017  . 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 Diabetes type 1, uncontrolled ([place]) 08/23/2015  . Onychomycosis of toenail 08/23/2015  . Phimosis 08/07/2014    Past Surgical History:  Procedure Laterality Date  . NO PAST SURGERIES         Family History   Problem Relation Age of Onset  . Thyroid disease Mother   . Diabetes Maternal Aunt   . Diabetes Maternal Grandmother   . Diabetes Maternal Grandfather   . Thyroid disease Other     Social History   Tobacco Use  . Smoking status: Current Every Day Smoker    Packs/day: 0.50    Years: 6.00    Pack years: 3.00    Types: Cigarettes  . Smokeless tobacco: Never Used  Vaping Use  . Vaping Use: Never used  Substance Use Topics  . Alcohol use: Yes    Comment: social   . Drug use: Yes    Types: Marijuana    Home Medications Prior to Admission medications   Medication Sig Start Date End Date Taking? Authorizing Provider  Accu-Chek FastClix Lancets MISC Use to check blood sugar up to 3 times daily. 06/03/19   Ladell Pier, MD  blood glucose meter kit and supplies KIT Dispense based on patient and insurance preference. Use up to four times daily as directed. (FOR ICD-9 250.00, 250.01). 12/01/19   Argentina Donovan, PA-C  Blood Glucose Monitoring Suppl (ACCU-CHEK GUIDE ME) w/Device KIT 1 kit by Does not apply route 3 (three) times daily. Use to check blood sugar up to 3 times daily. 06/03/19   Ladell Pier, MD  dicyclomine (BENTYL) 20 MG tablet Take 1 tablet (20 mg total) by mouth every 8 (eight) hours as needed for  spasms. 08/06/20   Tedd Sias, PA  glucose blood (ACCU-CHEK GUIDE) test strip 4x a day 06/03/19   Ladell Pier, MD  insulin glargine (LANTUS) 100 UNIT/ML Solostar Pen Inject 15 Units into the skin daily. 06/07/20   Ladell Pier, MD  metoCLOPramide (REGLAN) 10 MG tablet Take 1 tablet (10 mg total) by mouth every 8 (eight) hours as needed for nausea. 08/06/20   Nery Kalisz S, PA  NOVOLOG FLEXPEN 100 UNIT/ML FlexPen INJECT 15 UNITS INTO THE SKIN 3 (THREE) TIMES DAILY WITH MEALS. 07/19/20   Ladell Pier, MD  sildenafil (VIAGRA) 50 MG tablet TAKE 1 TABLET BY MOUTH 30 MINUTES PRIOR TO SEXUAL INTERCOURSE AS NEEDED *LIMIT 1 TABLET IN 24 HOURS 07/31/20    Ladell Pier, MD  TRUEPLUS 5-BEVEL PEN NEEDLES 31G X 8 MM MISC USE AS DIRECTED UP TO FOUR TIMES DAILY 01/05/20   Ladell Pier, MD  PROMETHAZINE HCL PO Take 1 tablet by mouth every 6 (six) hours as needed (nausea/vomitting).  02/23/20  [provider]  sucralfate (CARAFATE) 1 g tablet Take 1 tablet (1 g total) by mouth 4 (four) times daily for 14 days. Patient not taking: Reported on 02/23/2020 10/01/19 02/23/20  McDonald, Mia A, PA-C    Allergies    Shellfish allergy  Review of Systems   Review of Systems  Constitutional: Negative for chills and fever.  HENT: Negative for congestion.   Respiratory: Negative for shortness of breath.   Cardiovascular: Negative for chest pain.  Gastrointestinal: Positive for abdominal pain, nausea and vomiting. Negative for diarrhea.  Musculoskeletal: Negative for neck pain.    Physical Exam Updated Vital Signs BP (!) 148/102   Pulse 70   Temp 98.3 F (36.8 C) (Oral)   Resp (!) 21   SpO2 100%   Physical Exam Vitals and nursing note reviewed.  Constitutional:      General: He is in acute distress (uncomfortable).  HENT:     Head: Normocephalic and atraumatic.     Nose: Nose normal.  Eyes:     General: No scleral icterus. Cardiovascular:     Rate and Rhythm: Normal rate and regular rhythm.     Pulses: Normal pulses.     Heart sounds: Normal heart sounds.  Pulmonary:     Effort: Pulmonary effort is normal. No respiratory distress.     Breath sounds: No wheezing.  Abdominal:     Palpations: Abdomen is soft.     Tenderness: There is no abdominal tenderness.     Comments: Abdomen without any significant tenderness to palpation.  No guarding or rebound.  Musculoskeletal:     Cervical back: Normal range of motion.     Right lower leg: No edema.     Left lower leg: No edema.  Skin:    General: Skin is warm and dry.     Capillary Refill: Capillary refill takes less than 2 seconds.  Neurological:     Mental Status: He is alert.  Mental status is at baseline.  Psychiatric:        Mood and Affect: Mood normal.        Behavior: Behavior normal.     ED Results / Procedures / Treatments   Labs (all labs ordered are listed, but only abnormal results are displayed) Labs Reviewed  COMPREHENSIVE METABOLIC PANEL - Abnormal; Notable for the following components:      Result Value   Sodium 134 (*)    CO2 21 (*)  Glucose, Bld 322 (*)    Total Protein 8.7 (*)    All other components within normal limits  LIPASE, BLOOD  CBC  ETHANOL    EKG None  Radiology No results found.  Procedures Procedures (including critical care time)  Medications Ordered in ED Medications  haloperidol lactate (HALDOL) injection 5 mg (5 mg Intramuscular Given 08/06/20 2054)  dicyclomine (BENTYL) capsule 20 mg (20 mg Oral Given 08/06/20 2152)    ED Course  I have reviewed the triage vital signs and the nursing notes.  Pertinent labs & imaging results that were available during my care of the patient were reviewed by me and considered in my medical decision making (see chart for details).  29 year old male presented with generalized abdominal pain nausea and vomiting.  It appears that he is seen frequently for similar symptoms he was seen several times in January and several times in May/June.  On my exam patient is not significantly tender although he has holding his belly and endorsing severe pain.  He is overall well-appearing.  I have high suspicion for gastroparesis versus cannabinoid hyperemesis  I have very low suspicion for acute intra-abdominal emergent condition.  Labs and EKG reviewed below.  Clinical Course as of Aug 08 1737  Mon Aug 06, 2020  2050 I reviewed patient's prior EKGs he has QT within normal months we will provide patient with Haldol and plan discharge to follow-up with gastroenterology.   [WF]  2051 Patient with hyperglycemia without an anion gap low suspicion for DKA he is a type II diabetic.  CBC  without leukocytosis or anemia.  Lipase within normal limits doubt pancreatitis.  Anion gap: 14 [WF]  2137 Patient is somewhat improved after Haldol.  Will discharge with Reglan.  He will follow-up with gastroenterology.   [WF]    Clinical Course User Index [WF] Tedd Sias, [place]    I reviewed the EMR it appears the patient had a normal gastric emptying study done 07/12/2019 Has also had several contrast abdominal CT scan.   MDM Rules/Calculators/A&P                          Provide patient with 1 dose of Haldol which she tolerated well he feels somewhat improved.   He has not had any episodes of emesis while in his 5-hour ER stay.  He was successfully p.o. challenge.  Given Bentyl at discharge at his request.  Sent home with Reglan and Bentyl. Will FU with GI.   Final Clinical Impression(s) / ED Diagnoses Final diagnoses:  Non-intractable vomiting with nausea, unspecified vomiting type  Generalized abdominal pain    Rx / DC Orders ED Discharge Orders         Ordered    metoCLOPramide (REGLAN) 10 MG tablet  Every 8 hours PRN        08/06/20 2139    dicyclomine (BENTYL) 20 MG tablet  Every 8 hours PRN        08/06/20 2139           Tedd Sias, [place] 08/08/20 1739    Maudie Flakes, MD 08/09/20 [phone]

## 2020-08-06 NOTE — ED Notes (Signed)
Only able to retrieve lt green tube due to pt constantly moving and groaning. When second attempt to draw rest of labs, pt proceeded to move and groan.. When told pt to be still, he stopped groaning and stated "I'm not moving. I'm watching you miss the vein."

## 2020-08-06 NOTE — ED Notes (Signed)
This nurse came to room due to Code blue alarm, pulled alarm for pain medication. Patient found yelling at another nurse upon this nurses arrival. Patient redirected and educated on use of call light.

## 2020-08-06 NOTE — Discharge Instructions (Addendum)
Your lab work was reassuring today.  Please refrain from using marijuana or alcohol.  Please drink plenty of water.  Please use the medicine I have sent to your pharmacy for nausea.  Please follow-up with the gastroenterologist as you have been referred to in the past I have given you the contact information for an office here in [place].  Return to the emergency department if you have any new or concerning symptoms however is very important to follow-up with specialist at this point as you have had numerous episodes of similar symptoms in the past.  It is also important to control your diabetes as this can also cause severe vomiting

## 2020-08-06 NOTE — ED Triage Notes (Addendum)
Pt arrived via EMS, c/o diffuse abd pain, n/v worsening since 6am.   160/104 80's HR 339 CBG

## 2020-08-06 NOTE — ED Notes (Signed)
Pt to ER complaining of abdominal pain/nausea since this morning.  Pt reports similar history when smoking marijuana.  Pt reports that he smoked marijuana yesterday.  Respirations even and unlabored.  NADN.  Will continue to monitor.

## 2020-08-08 ENCOUNTER — Telehealth: Payer: Self-pay

## 2020-08-08 NOTE — Telephone Encounter (Signed)
Copied from CRM [id number]. Topic: General - Inquiry >> Aug 08, 2020 10:57 AM Adrian Prince D wrote: Reason for CRM: Patient called stating that he needed a letter from his doctor stating that his dog is a service connected animal. I asked if he would like to make an appointment to discuss this with his doctor, he hasn't been seen in office since October 2020, and he told me that he only has 48 hours and his landlord is going to make him give up the dog or violate his lease agreement. He wants to talk to his doctor immediately. He can be reached at [phone]. Please advise

## 2020-08-09 NOTE — Telephone Encounter (Signed)
Will forward to pcp

## 2020-08-10 NOTE — Telephone Encounter (Signed)
Please contact pt and schedule an appt with Dr. Laural Benes

## 2020-08-14 ENCOUNTER — Other Ambulatory Visit: Payer: Self-pay | Admitting: Internal Medicine

## 2020-08-14 DIAGNOSIS — N529 Male erectile dysfunction, unspecified: Secondary | ICD-10-CM

## 2020-08-31 ENCOUNTER — Other Ambulatory Visit: Payer: Self-pay | Admitting: Internal Medicine

## 2020-08-31 DIAGNOSIS — N529 Male erectile dysfunction, unspecified: Secondary | ICD-10-CM

## 2020-08-31 NOTE — Telephone Encounter (Signed)
Requested medication (s) are due for refill today: yes  Requested medication (s) are on the active medication list: yes  Last refill:  08/14/20  #10  0 refills  Future visit scheduled: yes  Notes to clinic:  Last BP 148/102 Please review    Requested Prescriptions  Pending Prescriptions Disp Refills   sildenafil (VIAGRA) 50 MG tablet [Pharmacy Med Name: Sildenafil Citrate 50 MG Oral Tablet] 10 tablet 0    Sig: TAKE ONE TABLET BY MOUTH 30 MINUTES PRIOR TO SEXUAL INTERCOURSE AS NEEDED. LIMIT 1 IN 24 HOURS      Urology: Erectile Dysfunction Agents Failed - 08/31/2020 12:49 PM      Failed - Last BP in normal range    BP Readings from Last 1 Encounters:  08/06/20 (!) 148/102          Passed - Valid encounter within last 12 months    Recent Outpatient Visits           9 months ago Type 1 diabetes mellitus with hyperglycemia [facility])   [facility] [place], [place], [place]   1 year ago Diabetes mellitus type 1, uncontrolled, without complications [facility])   [facility] Marcine Matar, MD   1 year ago Dry skin   [facility] [place], [place], [place]   1 year ago Diabetes mellitus type 1, uncontrolled, without complications [facility])   [facility] Marcine Matar, MD   2 years ago Back pain of lumbosacral region with sciatica   [facility] [place], Shea Stakes, NP       Future Appointments             In 1 month Laural Benes, Binnie Rail, MD [facility]

## 2020-09-21 ENCOUNTER — Other Ambulatory Visit: Payer: Self-pay | Admitting: Internal Medicine

## 2020-09-21 DIAGNOSIS — N529 Male erectile dysfunction, unspecified: Secondary | ICD-10-CM

## 2020-09-28 ENCOUNTER — Telehealth: Payer: Self-pay | Admitting: Internal Medicine

## 2020-09-28 ENCOUNTER — Other Ambulatory Visit: Payer: Self-pay | Admitting: Internal Medicine

## 2020-09-28 ENCOUNTER — Other Ambulatory Visit: Payer: Self-pay | Admitting: Family Medicine

## 2020-09-28 DIAGNOSIS — E1065 Type 1 diabetes mellitus with hyperglycemia: Secondary | ICD-10-CM

## 2020-09-28 MED ORDER — ACCU-CHEK GUIDE VI STRP
ORAL_STRIP | 0 refills | Status: DC
Start: 2020-09-28 — End: 2020-09-28

## 2020-09-28 MED ORDER — NOVOLOG FLEXPEN 100 UNIT/ML ~~LOC~~ SOPN: 15.0000 [IU] | PEN_INJECTOR | Freq: Three times a day (TID) | SUBCUTANEOUS | 0 refills | Status: DC

## 2020-09-28 MED FILL — ACCU-CHEK GUIDE TEST STRIP: 25 days supply | Qty: 100 | Fill #0

## 2020-09-28 MED FILL — NOVOLOG FLEXPEN SYRINGE: 100 | 33 days supply | Qty: 15 | Fill #0

## 2020-09-28 NOTE — Telephone Encounter (Signed)
Rx sent 

## 2020-09-28 NOTE — Telephone Encounter (Signed)
Appt 10/16/20- courtesy RF provided

## 2020-09-28 NOTE — Telephone Encounter (Signed)
Requesting refills of Novolog and test strips. Please advise.

## 2020-09-28 NOTE — Telephone Encounter (Signed)
Please fill if appropriate.  

## 2020-10-16 ENCOUNTER — Other Ambulatory Visit: Payer: Self-pay | Admitting: Internal Medicine

## 2020-10-16 ENCOUNTER — Other Ambulatory Visit: Payer: Self-pay

## 2020-10-16 ENCOUNTER — Ambulatory Visit: Payer: Medicaid Other | Attending: Internal Medicine | Admitting: Internal Medicine

## 2020-10-16 ENCOUNTER — Encounter: Payer: Self-pay | Admitting: Internal Medicine

## 2020-10-16 VITALS — BP 118/78 | HR 82 | Temp 98.6°F | Resp 16 | Wt 217.4 lb

## 2020-10-16 DIAGNOSIS — E1065 Type 1 diabetes mellitus with hyperglycemia: Secondary | ICD-10-CM | POA: Insufficient documentation

## 2020-10-16 DIAGNOSIS — Z833 Family history of diabetes mellitus: Secondary | ICD-10-CM | POA: Diagnosis not present

## 2020-10-16 DIAGNOSIS — Z79899 Other long term (current) drug therapy: Secondary | ICD-10-CM | POA: Insufficient documentation

## 2020-10-16 DIAGNOSIS — Z9119 Patient's noncompliance with other medical treatment and regimen: Secondary | ICD-10-CM | POA: Insufficient documentation

## 2020-10-16 DIAGNOSIS — I10 Essential (primary) hypertension: Secondary | ICD-10-CM | POA: Insufficient documentation

## 2020-10-16 DIAGNOSIS — Z6379 Other stressful life events affecting family and household: Secondary | ICD-10-CM | POA: Diagnosis not present

## 2020-10-16 DIAGNOSIS — Z2821 Immunization not carried out because of patient refusal: Secondary | ICD-10-CM

## 2020-10-16 DIAGNOSIS — Z794 Long term (current) use of insulin: Secondary | ICD-10-CM | POA: Insufficient documentation

## 2020-10-16 DIAGNOSIS — N529 Male erectile dysfunction, unspecified: Secondary | ICD-10-CM | POA: Diagnosis not present

## 2020-10-16 DIAGNOSIS — F17210 Nicotine dependence, cigarettes, uncomplicated: Secondary | ICD-10-CM | POA: Insufficient documentation

## 2020-10-16 LAB — POCT GLYCOSYLATED HEMOGLOBIN (HGB A1C): HbA1c, POC (controlled diabetic range): 12.7 % — AB (ref 0.0–7.0)

## 2020-10-16 LAB — GLUCOSE, POCT (MANUAL RESULT ENTRY): POC Glucose: 522 mg/dl — AB (ref 70–99)

## 2020-10-16 MED ORDER — SILDENAFIL CITRATE 50 MG PO TABS: ORAL_TABLET | ORAL | 4 refills | Status: DC

## 2020-10-16 MED ORDER — INSULIN GLARGINE 100 UNIT/ML SOLOSTAR PEN: 15.0000 [IU] | PEN_INJECTOR | Freq: Every day | SUBCUTANEOUS | 5 refills | Status: DC

## 2020-10-16 MED ORDER — NOVOLOG FLEXPEN 100 UNIT/ML ~~LOC~~ SOPN: 15.0000 [IU] | PEN_INJECTOR | Freq: Three times a day (TID) | SUBCUTANEOUS | 4 refills | Status: DC

## 2020-10-16 NOTE — Progress Notes (Signed)
Pt is requesting a letter for his dog for emotional support

## 2020-10-16 NOTE — Progress Notes (Signed)
Patient ID: Jonathan Porter, male    DOB: 11-Dec-1990  MRN: [id number]  CC: Diabetes and Hypertension   Subjective: Jonathan Porter is a 30 y.o. male who presents for chronic ds management.  Pt last seen by me 05/2019 His concerns today include:  Patient with history of insulin-dependent diabetes, genital warts, tob dep, ED, marijuana use, noncompliance  follow-up follow-up  . DIABETES TYPE 2 Last A1C:   Results for orders placed or performed in visit on 10/16/20  POCT glucose (manual entry)  Result Value Ref Range   POC Glucose 522 (A) 70 - 99 mg/dl  POCT glycosylated hemoglobin (Hb A1C)  Result Value Ref Range   Hemoglobin A1C     HbA1c POC (<> result, manual entry)     HbA1c, POC (prediabetic range)     HbA1c, POC (controlled diabetic range) 12.7 (A) 0.0 - 7.0 %    Med Adherence: takes insulin but admits that some days he forgets and goes the entire day without taking NovoLog.  He sometimes forgets to take NovoLog pen with him to work.  Blood sugar today is in the 500s.  He had told my CMA that he did not take any of his insulin today however he told me that he did take her morning dose of NovoLog.  States that blood sugar is elevated today because he has been drinking a lot of juices.. Medication side effects:  []  Yes    []  No Home Monitoring?  [x]  Yes  But not often and not every day.     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  He drives trucks Hypoglycemic episodes?: []  Yes    []  No Numbness of the feet? []  Yes    []  No Retinopathy hx? []  Yes    []  No Last eye exam: Gets eye exam at [place] - last eye exam was about 6 mths ago.  States he got new glasses at that time. Comments:   BP elev:  Tries to limit salt in foods.  "My kids stress me out that's way my blood pressure be high."  Requests refill on Viagra.  Wants letter to allow his dog to ride with him in his  truck for emotional support.  Adopted a Jonathan Porter about 18 months ago.  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  He feels that having his dog with him will help to keep him calm when he feels anxious.  He also has to pay a fee to his landlord for the dog.  Having a letter stating that he needs a dog for emotional support would allow him to keep the dog without having to play extra to his landlord.   HM: declines flu shot and Pneumonvax.  Declines COVID vaccine Patient Active Problem List   Diagnosis Date Noted  . Intractable nausea and vomiting 07/03/2019  . Gastroparesis 07/02/2019  . Influenza vaccine refused 06/03/2019  . Erectile dysfunction 06/03/2019  . Obesity (BMI 30-39.9) 06/03/2019  . Type 1 diabetes mellitus with diabetic polyneuropathy ([place]) 01/21/2019  . Type 1 diabetes mellitus with hyperglycemia ([place]) 01/21/2019  . Paronychia of finger of left hand 01/20/2017  . 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 Diabetes type 1, uncontrolled ([place]) 08/23/2015  . Onychomycosis of toenail 08/23/2015  .  Phimosis 08/07/2014     Current Outpatient Medications on File Prior to Visit  Medication Sig Dispense Refill  . Accu-Chek FastClix Lancets MISC Use to check blood sugar up to 3 times daily. 102 each 11  . blood glucose meter kit and supplies KIT Dispense based on patient and insurance preference. Use up to four times daily as directed. (FOR ICD-9 250.00, 250.01). 1 each 0  . Blood Glucose Monitoring Suppl (ACCU-CHEK GUIDE ME) w/Device KIT 1 kit by Does not apply route 3 (three) times daily. Use to check blood sugar up to 3 times daily. 1 kit 0  . dicyclomine (BENTYL) 20 MG tablet Take 1 tablet (20 mg total) by mouth every 8 (eight) hours as needed for spasms. (Patient not taking: Reported on 10/16/2020) 15 tablet 0  . glucose blood (ACCU-CHEK GUIDE) test strip USE AS DIRECTED UP TO FOUR TIMES DAILY 100  strip 0  . metoCLOPramide (REGLAN) 10 MG tablet Take 1 tablet (10 mg total) by mouth every 8 (eight) hours as needed for nausea. (Patient not taking: Reported on 10/16/2020) 10 tablet 0  . TRUEPLUS 5-BEVEL PEN NEEDLES 31G X 8 MM MISC USE AS DIRECTED UP TO FOUR TIMES DAILY 100 each 11  . [DISCONTINUED] PROMETHAZINE HCL PO Take 1 tablet by mouth every 6 (six) hours as needed (nausea/vomitting).    . [DISCONTINUED] sucralfate (CARAFATE) 1 g tablet Take 1 tablet (1 g total) by mouth 4 (four) times daily for 14 days. (Patient not taking: Reported on 02/23/2020) 56 tablet 0   No current facility-administered medications on file prior to visit.    Allergies  Allergen Reactions  . Shellfish Allergy Anaphylaxis    Social History   Socioeconomic History  . Marital status: Single    Spouse name: Not on file  . Number of children: Not on file  . Years of education: Not on file  . Highest education level: Not on file  Occupational History  . Not on file  Tobacco Use  . Smoking status: Current Every Day Smoker    Packs/day: 0.50    Years: 6.00    Pack years: 3.00    Types: Cigarettes  . Smokeless tobacco: Never Used  Vaping Use  . Vaping Use: Never used  Substance and Sexual Activity  . Alcohol use: Yes    Comment: social   . Drug use: Yes    Types: Marijuana  . Sexual activity: Yes    Birth control/protection: Condom    Comment: stated used condom 80% of the time   Other Topics Concern  . Not on file  Social History Narrative  . Not on file   Social Determinants of Health   Financial Resource Strain: Not on file  Food Insecurity: Not on file  Transportation Needs: Not on file  Physical Activity: Not on file  Stress: Not on file  Social Connections: Not on file  Intimate Partner Violence: Not on file    Family History  Problem Relation Age of Onset  . Thyroid disease Mother   . Diabetes Maternal Aunt   . Diabetes Maternal Grandmother   . Diabetes Maternal Grandfather   .  Thyroid disease Other     Past Surgical History:  Procedure Laterality Date  . NO PAST SURGERIES      ROS: Review of Systems Negative except as stated above  PHYSICAL EXAM: BP 118/78   Pulse 82   Temp 98.6 F (37 C)   Resp 16   Wt 217 lb 6.4  oz (98.6 kg)   SpO2 100%   BMI 29.48 kg/m   Wt Readings from Last 3 Encounters:  10/16/20 217 lb 6.4 oz (98.6 kg)  03/01/20 205 lb (93 kg)  02/19/20 205 lb (93 kg)    Physical Exam  General appearance - alert, well appearing, and in no distress.  Patient continuously looking on his phone and texting while I am speaking to him. Mental status - normal mood, behavior, speech, dress, motor activity, and thought processes Chest - clear to auscultation, no wheezes, rales or rhonchi, symmetric air entry Heart - normal rate, regular rhythm, normal S1, S2, no murmurs, rubs, clicks or gallops  Depression screen [facility] 2/9 12/01/2019 06/03/2019 09/28/2018  Decreased Interest 0 0 1  Down, Depressed, Hopeless 0 2 2  PHQ - 2 Score 0 2 3  Altered sleeping 0 3 3  Tired, decreased energy 0 1 3  Change in appetite 0 3 2  Feeling bad or failure about yourself  0 1 1  Trouble concentrating 0 0 0  Moving slowly or fidgety/restless 0 0 0  Suicidal thoughts 0 0 0  PHQ-9 Score 0 10 12   GAD 7 : Generalized Anxiety Score 12/01/2019 06/03/2019 09/28/2018 08/25/2018  Nervous, Anxious, on Edge 0 2 3 3   Control/stop worrying 0 3 3 3   Worry too much - different things 0 3 3 3   Trouble relaxing 0 3 3 3   Restless 0 3 3 3   Easily annoyed or irritable 0 3 3 2   Afraid - awful might happen 0 2 1 0  Total GAD 7 Score 0 19 19 17      CMP Latest Ref Rng & Units 08/06/2020 02/23/2020 02/19/2020  Glucose 70 - 99 mg/dL 322(H) 318(H) 297(H)  BUN 6 - 20 mg/dL 9 12 12   Creatinine 0.61 - 1.24 mg/dL 0.95 0.88 0.97  Sodium 135 - 145 mmol/L 134(L) 136 135  Potassium 3.5 - 5.1 mmol/L 4.2 3.9 4.0  Chloride 98 - 111 mmol/L 99 103 100  CO2 22 - 32 mmol/L 21(L) 23 22  Calcium 8.9  - 10.3 mg/dL 9.8 8.8(L) 9.4  Total Protein 6.5 - 8.1 g/dL 8.7(H) 7.0 8.0  Total Bilirubin 0.3 - 1.2 mg/dL 0.8 0.9 0.6  Alkaline Phos 38 - 126 U/L 119 111 134(H)  AST 15 - 41 U/L 18 19 22   ALT 0 - 44 U/L 19 16 16    Lipid Panel     Component Value Date/Time   CHOL (H) 08/31/2007 0440    339        ATP III CLASSIFICATION:  <200     mg/dL   Desirable  200-239  mg/dL   Borderline High  >=240    mg/dL   High   TRIG 260 (H) 08/31/2007 0440   HDL 39 08/31/2007 0440   CHOLHDL 8.7 08/31/2007 0440   VLDL 52 (H) 08/31/2007 0440   LDLCALC (H) 08/31/2007 0440    248        Total Cholesterol/HDL:CHD Risk Coronary Heart Disease Risk Table                     Men   Women  1/2 Average Risk   3.4   3.3    CBC    Component Value Date/Time   WBC 8.2 08/06/2020 1810   RBC 5.81 08/06/2020 1810   HGB 15.4 08/06/2020 1810   HCT 46.9 08/06/2020 1810   PLT 307 08/06/2020 1810   MCV 80.7 08/06/2020  1810   MCH 26.5 08/06/2020 1810   MCHC 32.8 08/06/2020 1810   RDW 13.3 08/06/2020 1810   LYMPHSABS 1.4 10/02/2019 0724   MONOABS 0.3 10/02/2019 0724   EOSABS 0.1 10/02/2019 0724   BASOSABS 0.1 10/02/2019 0724    ASSESSMENT AND PLAN: 1. Type 1 diabetes mellitus with hyperglycemia (HCC) Uncontrolled.  I suspect secondary to noncompliance.  Encourage compliance to prevent complications from diabetes.  Dietary counseling given.  Encouraged him to eliminate sugary drinks from his diet.  Advised him to keep one of the NovoLog pens and needles in his work backpack so that he always has a pen with him whether he is at work or at home -Patient declines receiving a shot of insulin while he is here today.  He also declines blood test to check for DKA. - POCT glucose (manual entry) - POCT glycosylated hemoglobin (Hb A1C) - insulin aspart (NOVOLOG FLEXPEN) 100 UNIT/ML FlexPen; Inject 15 Units into the skin 3 (three) times daily with meals.  Dispense: 15 mL; Refill: 4 - insulin glargine (LANTUS) 100 UNIT/ML  Solostar Pen; Inject 15 Units into the skin daily.  Dispense: 15 mL; Refill: 5  2. Erectile dysfunction, unspecified erectile dysfunction type - sildenafil (VIAGRA) 50 MG tablet; TAKE ONE TABLET BY MOUTH 30 MINUTES PRIOR TO SEXUAL INTERCOURSE AS NEEDED. LIMIT 1 IN 24 HOURS  Dispense: 10 tablet; Refill: 4  3. Influenza vaccine refused Recommended.  4. 23-polyvalent pneumococcal polysaccharide vaccine declined Recommended.  5. COVID-19 vaccination declined Strongly recommended.  Patient declined  6. Stressful life events affecting family and household I have written a letter requesting that he be allowed to keep his dog for emotional support and that he be allowed to take the dog with him at work in his truck.    Patient was given the opportunity to ask questions.  Patient verbalized understanding of the plan and was able to repeat key elements of the plan.   Orders Placed This Encounter  Procedures  . POCT glucose (manual entry)  . POCT glycosylated hemoglobin (Hb A1C)     Requested Prescriptions   Signed Prescriptions Disp Refills  . insulin aspart (NOVOLOG FLEXPEN) 100 UNIT/ML FlexPen 15 mL 4    Sig: Inject 15 Units into the skin 3 (three) times daily with meals.  . insulin glargine (LANTUS) 100 UNIT/ML Solostar Pen 15 mL 5    Sig: Inject 15 Units into the skin daily.  . sildenafil (VIAGRA) 50 MG tablet 10 tablet 4    Sig: TAKE ONE TABLET BY MOUTH 30 MINUTES PRIOR TO SEXUAL INTERCOURSE AS NEEDED. LIMIT 1 IN 24 HOURS    No follow-ups on file.  Karle Plumber, MD, FACP

## 2020-10-21 IMAGING — CT CT ABD-PELV W/ CM
1 series · 15 of 32 positions shown, 19 images · IV contrast (omnipaque)
Comparison: CT scan 04/10/2006

CLINICAL DATA: Abdominal cramping and nausea.

EXAM:
CT ABDOMEN AND PELVIS WITH CONTRAST
TECHNIQUE: Multidetector CT imaging of the abdomen and pelvis was performed
using the standard protocol following bolus administration of
intravenous contrast.
CONTRAST:  100mL OMNIPAQUE IOHEXOL 300 MG/ML  SOLN

[Series 2: axial st · axial · 0.81mm/px · z∈[+1149,+1539]mm · 15 of 87 slices shown, 19 images]
[im 6/87  soft-tissue]
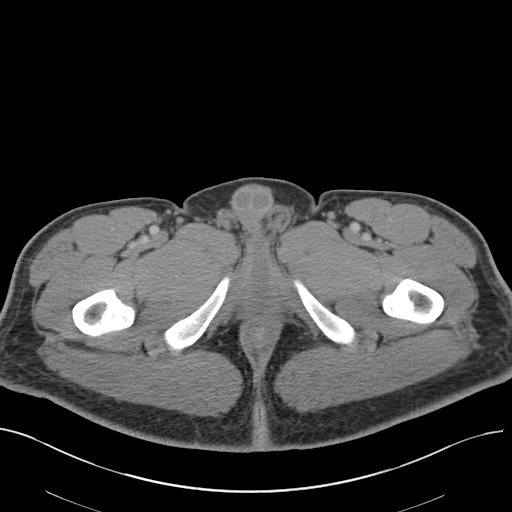
[im 6/87  bone]
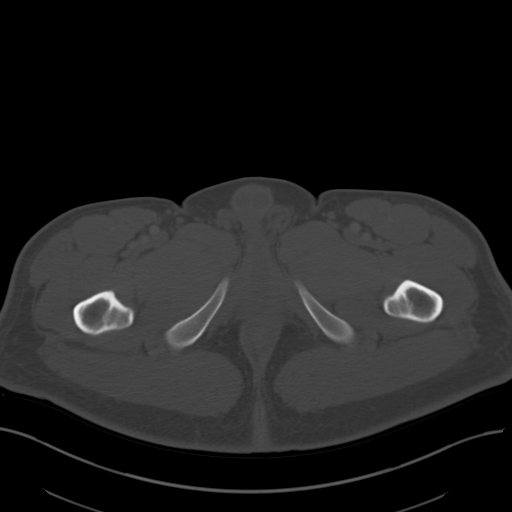
[im 12/87  soft-tissue]
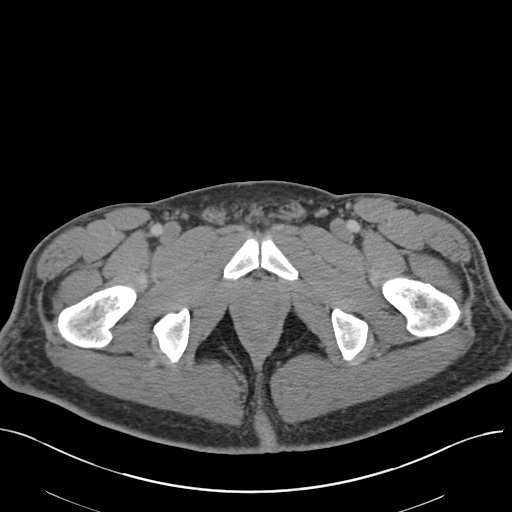
[im 17/87  soft-tissue]
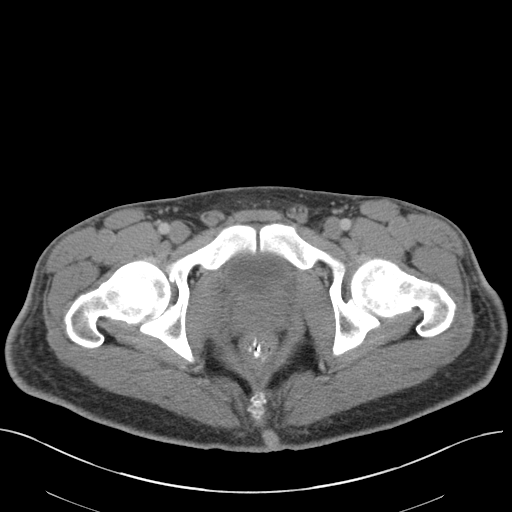
[im 25/87  soft-tissue]
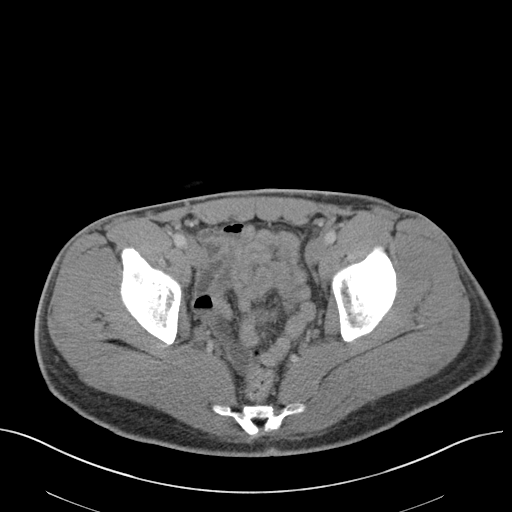
[im 31/87  soft-tissue]
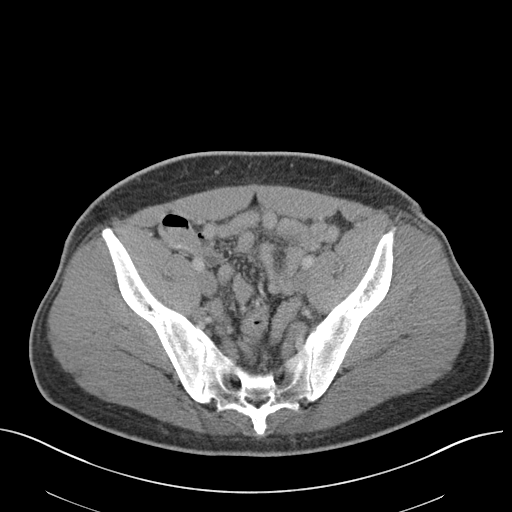
[im 37/87  soft-tissue]
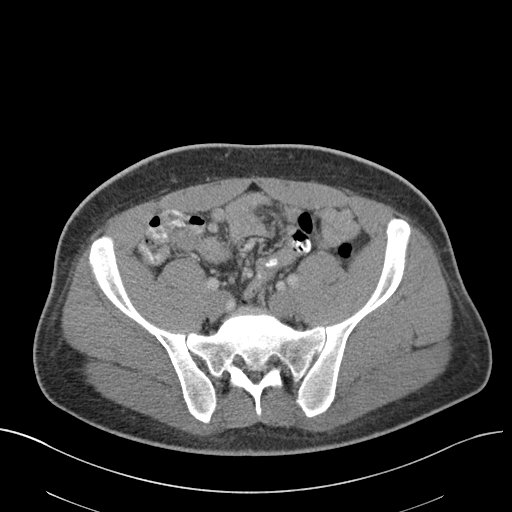
[im 45/87  soft-tissue]
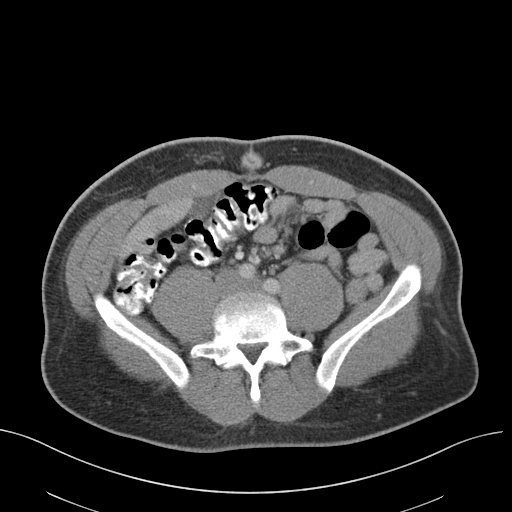
[im 50/87  soft-tissue]
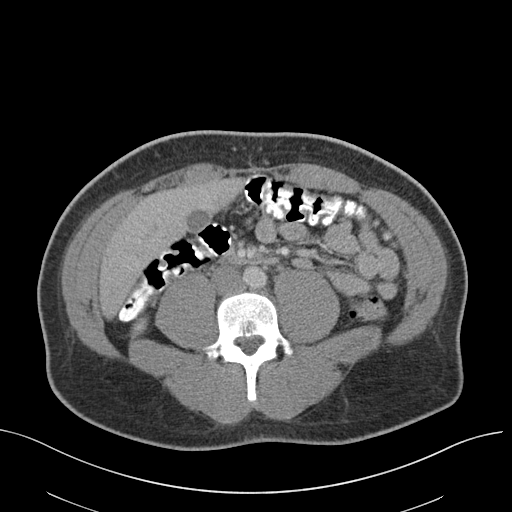
[im 56/87  soft-tissue]
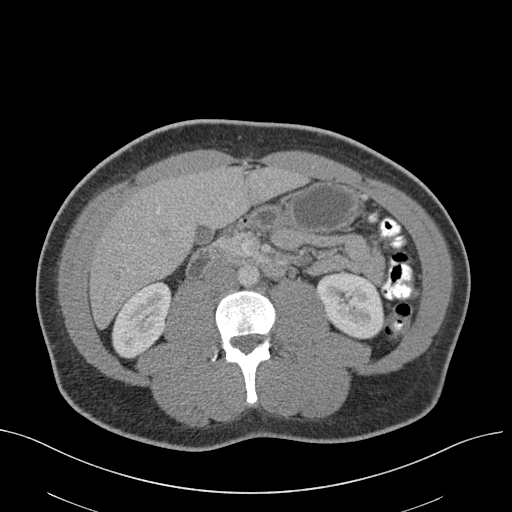
[im 56/87  bone]
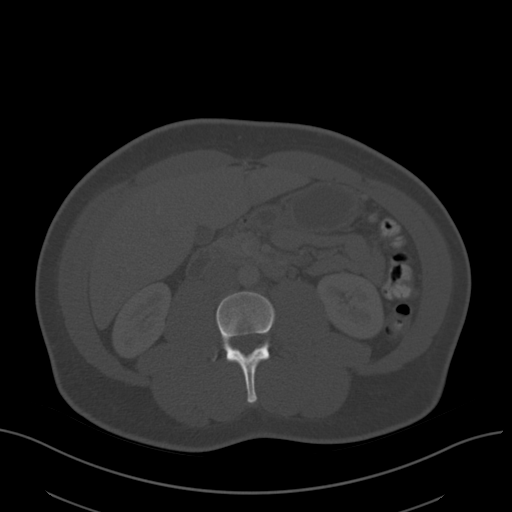
[im 62/87  soft-tissue]
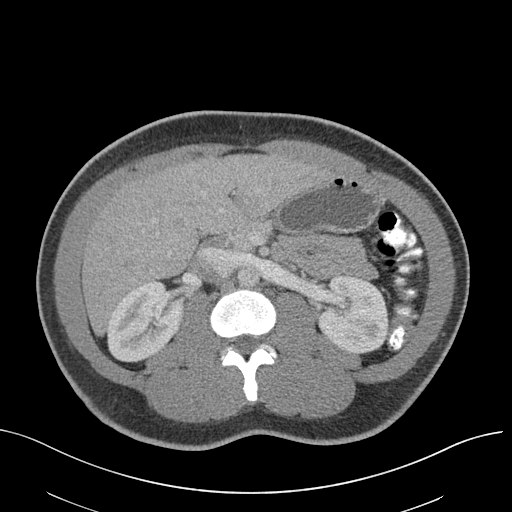
[im 70/87  soft-tissue]
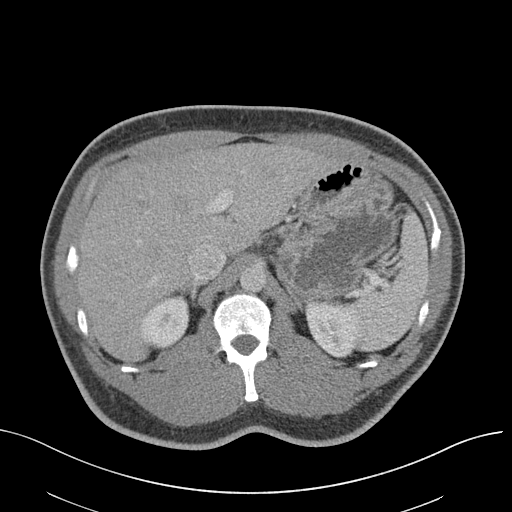
[im 75/87  soft-tissue]
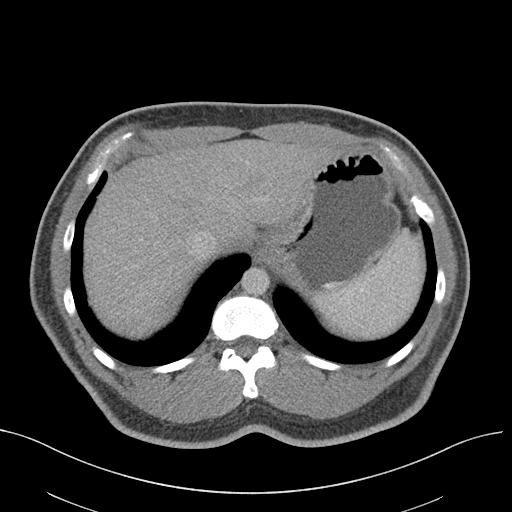
[im 75/87  lung]
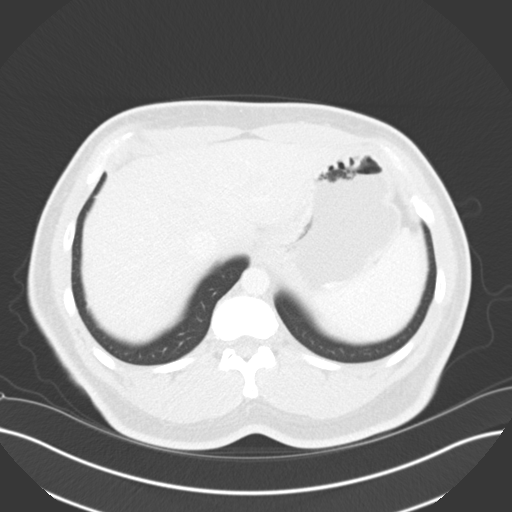
[im 78/87  lung]
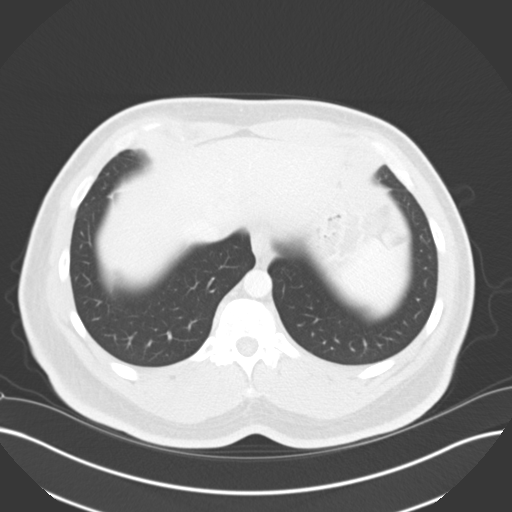
[im 81/87  soft-tissue]
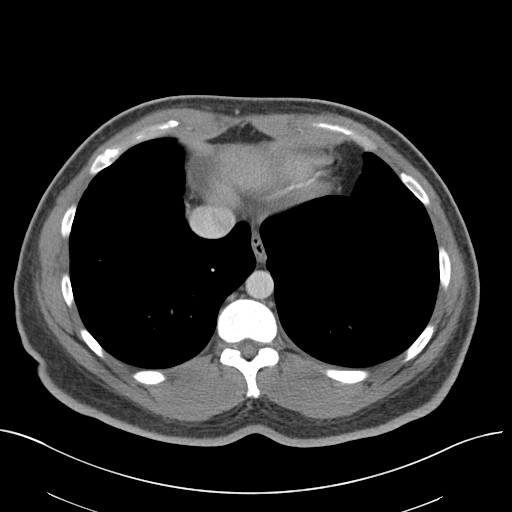
[im 81/87  lung]
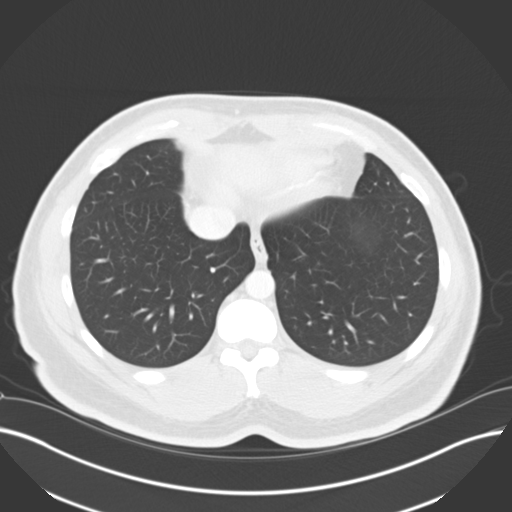
[im 84/87  lung]
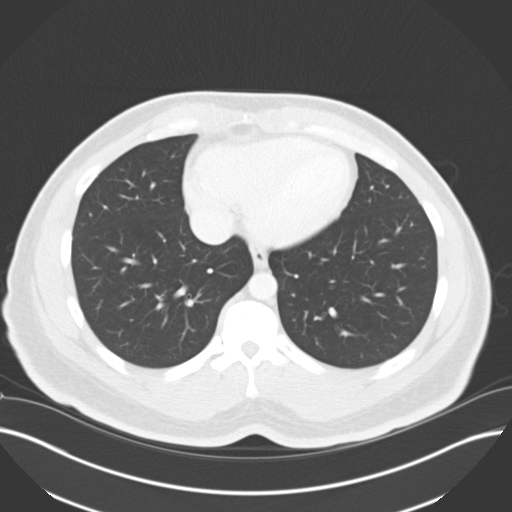

[15 of 32 positions shown; findings below may reference images not displayed]

FINDINGS: Lower chest: The lung bases are clear of acute process. No pleural
effusion or pulmonary lesions. 6 mm nodule in the right middle lobe
is stable. The heart is normal in size. No pericardial effusion. The
distal esophagus and aorta are unremarkable.

Hepatobiliary: No focal hepatic lesions or intrahepatic biliary
dilatation. The gallbladder appears normal. No common bile duct
dilatation.

Pancreas: No mass, inflammation or ductal dilatation.

Spleen: Normal size.  No focal lesions.

Adrenals/Urinary Tract: The adrenal glands and kidneys are
unremarkable. No renal, ureteral or bladder calculi or mass. No
findings suspicious for pyelonephritis.

Stomach/Bowel: The stomach, duodenum, small bowel and colon are
grossly normal without oral contrast. No inflammatory changes, mass
lesions or obstructive findings. The terminal ileum and appendix are
normal.

Vascular/Lymphatic: The aorta is normal in caliber. No dissection.
The branch vessels are patent. The major venous structures are
patent. No mesenteric or retroperitoneal mass or adenopathy. Small
scattered lymph nodes are noted.

Reproductive: The prostate gland and seminal vesicles are
unremarkable.

Other: No pelvic mass or adenopathy. No free pelvic fluid
collections. No inguinal mass or adenopathy. No abdominal wall
hernia or subcutaneous lesions.

Musculoskeletal: No significant bony findings. Stable mild
degenerate disc disease at L5-S1.
IMPRESSION: 1. No acute abdominal/pelvic findings, mass lesions or adenopathy.
2. Stable 6 mm nodule in the right middle lobe, likely a benign
lymph node.

## 2020-10-22 ENCOUNTER — Encounter: Payer: Self-pay | Admitting: Internal Medicine

## 2020-10-22 ENCOUNTER — Telehealth: Payer: Self-pay | Admitting: Internal Medicine

## 2020-10-22 NOTE — Telephone Encounter (Signed)
Will forward to pcp

## 2020-10-22 NOTE — Telephone Encounter (Signed)
Copied from CRM [id number]. Topic: General - Other >> Oct 18, 2020  8:32 AM Lyn Hollingshead D wrote: PT need another letter states home a work / please advise / Wants letter to allow his dog to ride with him in his truck for emotional support.  Adopted a [place] about 18 months ago.  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  He feels that having his dog with him will help to keep him calm when he feels anxious.  He also has to pay a fee to his landlord for the dog.  Having a letter stating that he needs a dog for emotional support would allow him to keep the dog without having to play extra to his landlord. >> Oct 22, 2020  1:45 PM Jaquita Rector A wrote:  Patient called in to inform Dr Laural Benes that he misplaced the letter from her about the fact that his dog is an emotional support animal. Ask that included in the letter it should say for work and home also so that the dog can be with him while he is out working. Ph# [phone]

## 2020-10-23 NOTE — Telephone Encounter (Signed)
Contacted pt and made aware that letter is ready for pick. Pt states he would like letter mailed. Verified address

## 2020-11-06 MED FILL — NOVOLOG FLEXPEN SYRINGE: 100 | 33 days supply | Qty: 15 | Fill #0

## 2020-12-15 ENCOUNTER — Encounter (HOSPITAL_COMMUNITY): Payer: Self-pay

## 2020-12-15 ENCOUNTER — Emergency Department (HOSPITAL_COMMUNITY)
Admission: EM | Admit: 2020-12-15 | Discharge: 2020-12-15 | Disposition: A | Discharge status: Home / Self Care | Payer: Medicaid Other | Attending: Emergency Medicine | Admitting: Emergency Medicine

## 2020-12-15 ENCOUNTER — Other Ambulatory Visit: Payer: Self-pay

## 2020-12-15 ENCOUNTER — Emergency Department (HOSPITAL_COMMUNITY): Payer: Medicaid Other

## 2020-12-15 ENCOUNTER — Emergency Department (HOSPITAL_COMMUNITY)
Admission: EM | Admit: 2020-12-15 | Discharge: 2020-12-16 | Disposition: A | Discharge status: Home / Self Care | Payer: Medicaid Other | Source: Home / Self Care | Attending: Emergency Medicine | Admitting: Emergency Medicine

## 2020-12-15 DIAGNOSIS — K3184 Gastroparesis: Secondary | ICD-10-CM | POA: Insufficient documentation

## 2020-12-15 DIAGNOSIS — E1040 Type 1 diabetes mellitus with diabetic neuropathy, unspecified: Secondary | ICD-10-CM | POA: Insufficient documentation

## 2020-12-15 DIAGNOSIS — Z794 Long term (current) use of insulin: Secondary | ICD-10-CM | POA: Diagnosis not present

## 2020-12-15 DIAGNOSIS — F17210 Nicotine dependence, cigarettes, uncomplicated: Secondary | ICD-10-CM | POA: Insufficient documentation

## 2020-12-15 DIAGNOSIS — R1010 Upper abdominal pain, unspecified: Secondary | ICD-10-CM | POA: Diagnosis present

## 2020-12-15 DIAGNOSIS — E1042 Type 1 diabetes mellitus with diabetic polyneuropathy: Secondary | ICD-10-CM | POA: Insufficient documentation

## 2020-12-15 DIAGNOSIS — R1115 Cyclical vomiting syndrome unrelated to migraine: Secondary | ICD-10-CM

## 2020-12-15 DIAGNOSIS — R112 Nausea with vomiting, unspecified: Secondary | ICD-10-CM | POA: Insufficient documentation

## 2020-12-15 LAB — COMPREHENSIVE METABOLIC PANEL
ALT: 21 U/L (ref 0–44)
AST: 20 U/L (ref 15–41)
Albumin: 4.7 g/dL (ref 3.5–5.0)
Alkaline Phosphatase: 105 U/L (ref 38–126)
Anion gap: 11 (ref 5–15)
BUN: 9 mg/dL (ref 6–20)
CO2: 23 mmol/L (ref 22–32)
Calcium: 9.6 mg/dL (ref 8.9–10.3)
Chloride: 105 mmol/L (ref 98–111)
Creatinine, Ser: 1.07 mg/dL (ref 0.61–1.24)
GFR, Estimated: >60 mL/min (ref >60)
Glucose, Bld: 262 mg/dL — ABNORMAL HIGH (ref 70–99)
Potassium: 3.5 mmol/L (ref 3.5–5.1)
Sodium: 139 mmol/L (ref 135–145)
Total Bilirubin: 0.7 mg/dL (ref 0.3–1.2)
Total Protein: 8.5 g/dL — ABNORMAL HIGH (ref 6.5–8.1)

## 2020-12-15 LAB — CBC WITH DIFFERENTIAL/PLATELET
Abs Immature Granulocytes: 0.02 10*3/uL (ref 0.00–0.07)
Basophils Absolute: 0.1 10*3/uL (ref 0.0–0.1)
Basophils Relative: 1 %
Eosinophils Absolute: 0.1 10*3/uL (ref 0.0–0.5)
Eosinophils Relative: 2 %
HCT: 45.6 % (ref 39.0–52.0)
Hemoglobin: 15.3 g/dL (ref 13.0–17.0)
Immature Granulocytes: 0 %
Lymphocytes Relative: 21 %
Lymphs Abs: 1.5 10*3/uL (ref 0.7–4.0)
MCH: 27.6 pg (ref 26.0–34.0)
MCHC: 33.6 g/dL (ref 30.0–36.0)
MCV: 82.3 fL (ref 80.0–100.0)
Monocytes Absolute: 0.3 10*3/uL (ref 0.1–1.0)
Monocytes Relative: 4 %
Neutro Abs: 5.1 10*3/uL (ref 1.7–7.7)
Neutrophils Relative %: 72 %
Platelets: 347 10*3/uL (ref 150–400)
RBC: 5.54 MIL/uL (ref 4.22–5.81)
RDW: 13.8 % (ref 11.5–15.5)
WBC: 7.2 10*3/uL (ref 4.0–10.5)
nRBC: 0 % (ref 0.0–0.2)

## 2020-12-15 LAB — LIPASE, BLOOD: Lipase: 21 U/L (ref 11–51)

## 2020-12-15 LAB — CBG MONITORING, ED: Glucose-Capillary: 214 mg/dL — ABNORMAL HIGH (ref 70–99)

## 2020-12-15 MED ORDER — HALOPERIDOL LACTATE 5 MG/ML IJ SOLN
5.0000 mg | Freq: Once | INTRAMUSCULAR | Status: AC
  Administered 2020-12-15: 5 mg via INTRAVENOUS
  Filled 2020-12-15: qty 1

## 2020-12-15 MED ORDER — SODIUM CHLORIDE 0.9 % IV BOLUS
1000.0000 mL | Freq: Once | INTRAVENOUS | Status: AC
  Administered 2020-12-15: 1000 mL via INTRAVENOUS

## 2020-12-15 MED ORDER — FENTANYL CITRATE (PF) 100 MCG/2ML IJ SOLN
100.0000 ug | Freq: Once | INTRAMUSCULAR | Status: AC
  Administered 2020-12-15: 100 ug via INTRAVENOUS
  Filled 2020-12-15: qty 2

## 2020-12-15 MED ORDER — ONDANSETRON 4 MG PO TBDP: 4.0000 mg | ORAL_TABLET | Freq: Three times a day (TID) | ORAL | 0 refills | Status: DC | PRN

## 2020-12-15 MED ORDER — ONDANSETRON HCL 4 MG/2ML IJ SOLN
4.0000 mg | Freq: Once | INTRAMUSCULAR | Status: AC
  Administered 2020-12-15: 4 mg via INTRAVENOUS
  Filled 2020-12-15: qty 2

## 2020-12-15 MED ORDER — DIPHENHYDRAMINE HCL 50 MG/ML IJ SOLN
25.0000 mg | Freq: Once | INTRAMUSCULAR | Status: AC
  Administered 2020-12-15: 25 mg via INTRAVENOUS
  Filled 2020-12-15: qty 1

## 2020-12-15 MED ORDER — METOCLOPRAMIDE HCL 5 MG/ML IJ SOLN
10.0000 mg | Freq: Once | INTRAMUSCULAR | Status: AC
  Administered 2020-12-15: 10 mg via INTRAVENOUS
  Filled 2020-12-15: qty 2

## 2020-12-15 MED ORDER — KETOROLAC TROMETHAMINE 15 MG/ML IJ SOLN
15.0000 mg | Freq: Once | INTRAMUSCULAR | Status: AC
  Administered 2020-12-15: 15 mg via INTRAVENOUS
  Filled 2020-12-15: qty 1

## 2020-12-15 MED ORDER — IOHEXOL 300 MG/ML  SOLN: 100.0000 mL | Freq: Once | INTRAMUSCULAR | Status: DC | PRN

## 2020-12-15 MED ORDER — FENTANYL CITRATE (PF) 100 MCG/2ML IJ SOLN
50.0000 ug | Freq: Once | INTRAMUSCULAR | Status: AC
  Administered 2020-12-15: 50 ug via INTRAVENOUS
  Filled 2020-12-15: qty 2

## 2020-12-15 NOTE — ED Triage Notes (Signed)
EMS reports from home, abdominal pain, nausea and emesis since the morning.   BP 180/90 HR 90 RR 18 Sp02 95 RA CBG 184  Refused cooperation with IV enroute.

## 2020-12-15 NOTE — ED Notes (Addendum)
Patient throwing up on floor and says staff should clean it up. Patient had a trash can by his bedside and a vomit bag. Patient still threw up on the floor and states it staffs responsibility to clean up when he throws up on the floor. Patient is cussing and being very rude to staff. Patient has already been here once today. Security is by the patient room.

## 2020-12-15 NOTE — ED Notes (Signed)
Pt has been sleeping since medication administration

## 2020-12-15 NOTE — ED Notes (Signed)
Patient stated that he cannot have his vital signs taken until he gets pain medication and can calm down.

## 2020-12-15 NOTE — ED Triage Notes (Signed)
Patient presents from home with continuing complaints of nausea and vomiting. Patient seen here earlier for same. Per EMS, EMS witnessed patient attempting to make himself vomit. Patient requesting fentanyl because it is, "the only thing that works."

## 2020-12-15 NOTE — ED Provider Notes (Signed)
[facility] DEPT Provider Note   CSN: [id number] Arrival date & time: 12/15/20  1236     History Chief Complaint  Patient presents with  . Abdominal Pain    v  . Nausea  . Emesis    Jonathan Porter is a 30 y.o. male presented for evaluation nausea, vomiting, abdominal pain.  Patient states he did not feel well last night, however he was awoken at 6 AM this morning with severe abdominal pain, nausea, vomiting.  He has vomited at least 15-20 times.  No blood in his emesis.  He denies fevers, chills, chest pain, shortness of breath, change in urination or bowel movements.  He has associated nominal pain, which he states is mostly in his upper abdomen.  He has a history of similar, is usually able to be treated with IV medications.  He has not checked his blood sugars recently, does not know if they have been high.  He reports no other medical problems besides diabetes.  He denies alcohol use.  He does report marijuana and tobacco use, no other drug use.  Additional history obtained from chart review.  Patient with a history of diabetes.  He does have gastroparesis listed, however he has had a normal gastric emptying study.   HPI     Past Medical History:  Diagnosis Date  . Diabetes mellitus without complication ([place])   . Gastroparesis     Patient Active Problem List   Diagnosis Date Noted  . Intractable nausea and vomiting 07/03/2019  . Gastroparesis 07/02/2019  . Influenza vaccine refused 06/03/2019  . Erectile dysfunction 06/03/2019  . Obesity (BMI 30-39.9) 06/03/2019  . Type 1 diabetes mellitus with diabetic polyneuropathy ([place]) 01/21/2019  . Type 1 diabetes mellitus with hyperglycemia ([place]) 01/21/2019  . Paronychia of finger of left hand 01/20/2017  . 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 Diabetes type 1, uncontrolled ([place]) 08/23/2015  . Onychomycosis of toenail 08/23/2015  . Phimosis 08/07/2014    Past Surgical  History:  Procedure Laterality Date  . NO PAST SURGERIES         Family History  Problem Relation Age of Onset  . Thyroid disease Mother   . Diabetes Maternal Aunt   . Diabetes Maternal Grandmother   . Diabetes Maternal Grandfather   . Thyroid disease Other     Social History   Tobacco Use  . Smoking status: Current Every Day Smoker    Packs/day: 0.50    Years: 6.00    Pack years: 3.00    Types: Cigarettes  . Smokeless tobacco: Never Used  Vaping Use  . Vaping Use: Never used  Substance Use Topics  . Alcohol use: Yes    Comment: social   . Drug use: Yes    Types: Marijuana    Home Medications Prior to Admission medications   Medication Sig Start Date End Date Taking? Authorizing Provider  ondansetron (ZOFRAN ODT) 4 MG disintegrating tablet Take 1 tablet (4 mg total) by mouth every 8 (eight) hours as needed for nausea or vomiting. 12/15/20  Yes Shiven Junious, PA-C  Accu-Chek FastClix Lancets MISC Use to check blood sugar up to 3 times daily. 06/03/19   Ladell Pier, MD  blood glucose meter kit and supplies KIT Dispense based on patient and insurance preference. Use up to four times daily as directed. (FOR ICD-9 250.00, 250.01). 12/01/19   Argentina Donovan, PA-C  Blood Glucose Monitoring Suppl (ACCU-CHEK GUIDE ME) w/Device KIT  1 kit by Does not apply route 3 (three) times daily. Use to check blood sugar up to 3 times daily. 06/03/19   Ladell Pier, MD  dicyclomine (BENTYL) 20 MG tablet Take 1 tablet (20 mg total) by mouth every 8 (eight) hours as needed for spasms. Patient not taking: Reported on 10/16/2020 08/06/20   Tedd Sias, PA  glucose blood (ACCU-CHEK GUIDE) test strip USE AS DIRECTED UP TO FOUR TIMES DAILY 09/28/20   Ladell Pier, MD  insulin aspart (NOVOLOG FLEXPEN) 100 UNIT/ML FlexPen Inject 15 Units into the skin 3 (three) times daily with meals. 10/16/20   Ladell Pier, MD  insulin glargine (LANTUS) 100 UNIT/ML Solostar Pen Inject 15  Units into the skin daily. 10/16/20   Ladell Pier, MD  metoCLOPramide (REGLAN) 10 MG tablet Take 1 tablet (10 mg total) by mouth every 8 (eight) hours as needed for nausea. Patient not taking: Reported on 10/16/2020 08/06/20   Tedd Sias, PA  sildenafil (VIAGRA) 50 MG tablet TAKE ONE TABLET BY MOUTH 30 MINUTES PRIOR TO SEXUAL INTERCOURSE AS NEEDED. LIMIT 1 IN 24 HOURS 10/16/20   Ladell Pier, MD  TRUEPLUS 5-BEVEL PEN NEEDLES 31G X 8 MM MISC USE AS DIRECTED UP TO FOUR TIMES DAILY 01/05/20   Ladell Pier, MD  PROMETHAZINE HCL PO Take 1 tablet by mouth every 6 (six) hours as needed (nausea/vomitting).  02/23/20  [provider]  sucralfate (CARAFATE) 1 g tablet Take 1 tablet (1 g total) by mouth 4 (four) times daily for 14 days. Patient not taking: Reported on 02/23/2020 10/01/19 02/23/20  McDonald, Mia A, PA-C    Allergies    Shellfish allergy  Review of Systems   Review of Systems  Gastrointestinal: Positive for abdominal pain, nausea and vomiting.  All other systems reviewed and are negative.   Physical Exam Updated Vital Signs BP (!) 140/101   Pulse 81   Temp 97.9 F (36.6 C)   Resp 20   SpO2 99%   Physical Exam Vitals and nursing note reviewed.  Constitutional:      General: He is not in acute distress.    Appearance: He is well-developed.     Comments: Appears uncomfortable due to pain and nausea/vomiting.  Nontoxic  HENT:     Head: Normocephalic and atraumatic.  Eyes:     Extraocular Movements: Extraocular movements intact.     Conjunctiva/sclera: Conjunctivae normal.     Pupils: Pupils are equal, round, and reactive to light.  Cardiovascular:     Rate and Rhythm: Normal rate and regular rhythm.     Pulses: Normal pulses.  Pulmonary:     Effort: Pulmonary effort is normal. No respiratory distress.     Breath sounds: Normal breath sounds. No wheezing.  Abdominal:     General: There is no distension.     Palpations: Abdomen is soft. There is no  mass.     Tenderness: There is abdominal tenderness. There is no guarding or rebound.     Comments: Tenderness to palpation of upper abdomen.  No rigidity, guarding, distention.  Negative rebound.  No peritonitis.  Musculoskeletal:        General: Normal range of motion.     Cervical back: Normal range of motion and neck supple.  Skin:    General: Skin is warm and dry.     Capillary Refill: Capillary refill takes less than 2 seconds.  Neurological:     Mental Status: He is alert  and oriented to person, place, and time.     ED Results / Procedures / Treatments   Labs (all labs ordered are listed, but only abnormal results are displayed) Labs Reviewed  COMPREHENSIVE METABOLIC PANEL - Abnormal; Notable for the following components:      Result Value   Glucose, Bld 262 (*)    Total Protein 8.5 (*)    All other components within normal limits  CBG MONITORING, ED - Abnormal; Notable for the following components:   Glucose-Capillary 214 (*)    All other components within normal limits  CBC WITH DIFFERENTIAL/PLATELET  LIPASE, BLOOD  URINALYSIS, ROUTINE W REFLEX MICROSCOPIC    EKG None  Radiology No results found.  Procedures Procedures   Medications Ordered in ED Medications  ondansetron (ZOFRAN) injection 4 mg (4 mg Intravenous Given 12/15/20 1352)  sodium chloride 0.9 % bolus 1,000 mL (0 mLs Intravenous Stopped 12/15/20 1612)  ketorolac (TORADOL) 15 MG/ML injection 15 mg (15 mg Intravenous Given 12/15/20 1353)  metoCLOPramide (REGLAN) injection 10 mg (10 mg Intravenous Given 12/15/20 1501)  diphenhydrAMINE (BENADRYL) injection 25 mg (25 mg Intravenous Given 12/15/20 1502)  fentaNYL (SUBLIMAZE) injection 100 mcg (100 mcg Intravenous Given 12/15/20 1501)    ED Course  I have reviewed the triage vital signs and the nursing notes.  Pertinent labs & imaging results that were available during my care of the patient were reviewed by me and considered in my medical decision making  (see chart for details).    MDM Rules/Calculators/A&P                          Patient presented for evaluation of nausea, vomiting, abdominal pain.  On exam, patient appears uncomfortable due to pain and nausea, walking around the room.  He does have tenderness palpation of the upper abdomen.  However he has a history of similar, often will be treated with IV medications.  No fever, low suspicion for intra-abdominal infection.  Consider DKA as patient does have a history of type 1 diabetes.  Consider gastroparesis as patient has a reported history of this.  Consider cyclical vomiting, especially in the setting of marijuana use.  Consider pancreatitis.  Will obtain labs, treat symptomatically and reassess.  Labs interpreted by me, mild hyperglycemia in the 200s, but overall reassuring.  No signs of DKA.  On reassessment after fluids, Toradol, Zofran, patient still has nausea, vomiting, pain.  Will treat with Reglan, Benadryl and fentanyl.   On reassessment, pt states he is feeling much better. Will PO challenge and plan for d/c.  At this time, patient appears safe for discharge.  Return precautions given.  Patient states he understands and agrees to plan.  Final Clinical Impression(s) / ED Diagnoses Final diagnoses:  Non-intractable vomiting with nausea, unspecified vomiting type  Upper abdominal pain    Rx / DC Orders ED Discharge Orders         Ordered    ondansetron (ZOFRAN ODT) 4 MG disintegrating tablet  Every 8 hours PRN        12/15/20 1644           Liev Brockbank, PA-C 12/15/20 1655    Sherwood Gambler, MD 12/15/20 1721

## 2020-12-15 NOTE — ED Notes (Signed)
[facility]- 214 Nurse, Kathlene November aware.

## 2020-12-15 NOTE — Discharge Instructions (Signed)
Continue taking her medications as prescribed. Use Zofran as needed for nausea or vomiting. Make sure you are staying well-hydrated with water. Slowly progress your diet from liquids to solids. Follow-up with your primary care doctor for recheck of your symptoms. Return to the emergency room if you develop high fevers, persistent vomiting spite medication, severe worsening pain, any new, worsening, or concerning symptoms.

## 2020-12-16 NOTE — ED Provider Notes (Signed)
[facility] DEPT Provider Note   CSN: [id number] Arrival date & time: 12/15/20  1944     History Chief Complaint  Patient presents with  . Emesis    Jonathan Porter is a 30 y.o. male.  HPI    30 year old male comes in a chief complaint of abdominal pain and vomiting. Patient has poorly controlled type 1 diabetes for several years and gastroparesis.  He reports that this current attack started suddenly few hours back.  He was seen in the ER earlier today and had improved, but once he got home his symptoms returned.  He is having nausea, vomiting with bilious emesis and epigastric abdominal discomfort.  Past Medical History:  Diagnosis Date  . Diabetes mellitus without complication ([place])   . Gastroparesis     Patient Active Problem List   Diagnosis Date Noted  . Intractable nausea and vomiting 07/03/2019  . Gastroparesis 07/02/2019  . Influenza vaccine refused 06/03/2019  . Erectile dysfunction 06/03/2019  . Obesity (BMI 30-39.9) 06/03/2019  . Type 1 diabetes mellitus with diabetic polyneuropathy ([place]) 01/21/2019  . Type 1 diabetes mellitus with hyperglycemia ([place]) 01/21/2019  . Paronychia of finger of left hand 01/20/2017  . 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 Diabetes type 1, uncontrolled ([place]) 08/23/2015  . Onychomycosis of toenail 08/23/2015  . Phimosis 08/07/2014    Past Surgical History:  Procedure Laterality Date  . NO PAST SURGERIES         Family History  Problem Relation Age of Onset  . Thyroid disease Mother   . Diabetes Maternal Aunt   . Diabetes Maternal Grandmother   . Diabetes Maternal Grandfather   . Thyroid disease Other     Social History   Tobacco Use  . Smoking status: Current Every Day Smoker    Packs/day: 0.50    Years: 6.00    Pack years: 3.00    Types: Cigarettes  . Smokeless tobacco: Never Used  Vaping Use  . Vaping Use: Never used  Substance Use Topics  . Alcohol  use: Yes    Comment: social   . Drug use: Yes    Types: Marijuana    Home Medications Prior to Admission medications   Medication Sig Start Date End Date Taking? Authorizing Provider  Accu-Chek FastClix Lancets MISC Use to check blood sugar up to 3 times daily. 06/03/19   Ladell Pier, MD  blood glucose meter kit and supplies KIT Dispense based on patient and insurance preference. Use up to four times daily as directed. (FOR ICD-9 250.00, 250.01). 12/01/19   Argentina Donovan, PA-C  Blood Glucose Monitoring Suppl (ACCU-CHEK GUIDE ME) w/Device KIT 1 kit by Does not apply route 3 (three) times daily. Use to check blood sugar up to 3 times daily. 06/03/19   Ladell Pier, MD  dicyclomine (BENTYL) 20 MG tablet Take 1 tablet (20 mg total) by mouth every 8 (eight) hours as needed for spasms. Patient not taking: No sig reported 08/06/20   Pati Gallo S, PA  glucose blood (ACCU-CHEK GUIDE) test strip USE AS DIRECTED UP TO FOUR TIMES DAILY Patient not taking: No sig reported 09/28/20   Ladell Pier, MD  insulin aspart (NOVOLOG FLEXPEN) 100 UNIT/ML FlexPen Inject 15 Units into the skin 3 (three) times daily with meals. 10/16/20   Ladell Pier, MD  insulin glargine (LANTUS) 100 UNIT/ML Solostar Pen Inject 15 Units into the skin daily. 10/16/20   Ladell Pier, MD  metoCLOPramide (REGLAN) 10 MG tablet Take 1 tablet (10 mg total) by mouth every 8 (eight) hours as needed for nausea. Patient not taking: No sig reported 08/06/20   Pati Gallo S, PA  ondansetron (ZOFRAN ODT) 4 MG disintegrating tablet Take 1 tablet (4 mg total) by mouth every 8 (eight) hours as needed for nausea or vomiting. Patient not taking: No sig reported 12/15/20   Caccavale, Sophia, PA-C  sildenafil (VIAGRA) 50 MG tablet TAKE ONE TABLET BY MOUTH 30 MINUTES PRIOR TO SEXUAL INTERCOURSE AS NEEDED. LIMIT 1 IN 24 HOURS 10/16/20   Ladell Pier, MD  TRUEPLUS 5-BEVEL PEN NEEDLES 31G X 8 MM MISC USE AS DIRECTED UP  TO FOUR TIMES DAILY 01/05/20   Ladell Pier, MD  PROMETHAZINE HCL PO Take 1 tablet by mouth every 6 (six) hours as needed (nausea/vomitting).  02/23/20  [provider]  sucralfate (CARAFATE) 1 g tablet Take 1 tablet (1 g total) by mouth 4 (four) times daily for 14 days. Patient not taking: Reported on 02/23/2020 10/01/19 02/23/20  McDonald, Mia A, PA-C    Allergies    Shellfish allergy  Review of Systems   Review of Systems  Constitutional: Positive for activity change.  Respiratory: Negative for shortness of breath.   Cardiovascular: Negative for chest pain.  Gastrointestinal: Positive for abdominal distention.  Hematological: Does not bruise/bleed easily.  All other systems reviewed and are negative.   Physical Exam Updated Vital Signs BP (!) 151/90   Pulse 90   Temp 98.7 F (37.1 C)   Resp 14   SpO2 99%   Physical Exam Vitals and nursing note reviewed.  Constitutional:      Appearance: He is well-developed.  HENT:     Head: Atraumatic.  Cardiovascular:     Rate and Rhythm: Normal rate.  Pulmonary:     Effort: Pulmonary effort is normal.  Abdominal:     Palpations: Abdomen is soft.     Tenderness: There is abdominal tenderness. There is guarding. There is no rebound.  Musculoskeletal:     Cervical back: Neck supple.  Skin:    General: Skin is warm.  Neurological:     Mental Status: He is alert and oriented to person, place, and time.     ED Results / Procedures / Treatments   Labs (all labs ordered are listed, but only abnormal results are displayed) Labs Reviewed - No data to display  EKG EKG Interpretation  Date/Time:  Saturday December 15 2020 21:36:18 EDT Ventricular Rate:  84 PR Interval:    QRS Duration: 93 QT Interval:  377 QTC Calculation: 446 R Axis:   62 Text Interpretation: Sinus rhythm Ventricular premature complex Borderline T abnormalities, inferior leads No acute changes No significant change since last tracing Confirmed by  Varney Biles [id number]) on 12/15/2020 10:50:21 PM   Radiology No results found.  Procedures Procedures   Medications Ordered in ED Medications  iohexol (OMNIPAQUE) 300 MG/ML solution 100 mL (has no administration in time range)  sodium chloride 0.9 % bolus 1,000 mL (1,000 mLs Intravenous New Bag/Given 12/15/20 2012)  haloperidol lactate (HALDOL) injection 5 mg (5 mg Intravenous Given 12/15/20 2013)  fentaNYL (SUBLIMAZE) injection 50 mcg (50 mcg Intravenous Given 12/15/20 2013)    ED Course  I have reviewed the triage vital signs and the nursing notes.  Pertinent labs & imaging results that were available during my care of the patient were reviewed by me and considered in my medical decision making (see chart for  details).    MDM Rules/Calculators/A&P                          30 year old with history of type 1 diabetes and gastroparesis comes in a chief complaint of nausea, vomiting and abdominal pain.  He is having epigastric abdominal pain.  Previously has been diagnosed with cyclic vomiting syndrome and gastroparesis.  Patient also admits to occasional marijuana use, therefore there could be possibly a component of cannabinoid hyperemesis syndrome.  Patient was seen in the ER earlier.  He had improved with Reglan and pain medications.  He returns because of symptoms came back.  He was given Haldol and fentanyl by [place].  He felt better.  Abdominal exam did not show any peritonitis.  Reassessed x 2, p.o. challenge passed.  Patient remained pain-free and was discharged.  Final Clinical Impression(s) / ED Diagnoses Final diagnoses:  Gastroparesis  Cyclic vomiting syndrome    Rx / DC Orders ED Discharge Orders    None       Varney Biles, MD 12/16/20 1610

## 2020-12-16 NOTE — Discharge Instructions (Addendum)
We saw in the ER for nausea, vomiting and abdominal pain.  Please read the instructions provided on the diet to take in the conditions that you think might be triggering this.    Take the Reglan that was prescribed earlier today.

## 2020-12-17 ENCOUNTER — Other Ambulatory Visit: Payer: Self-pay

## 2020-12-17 ENCOUNTER — Observation Stay (HOSPITAL_COMMUNITY)
Admission: EM | Admit: 2020-12-17 | Discharge: 2020-12-18 | Disposition: A | Discharge status: Home / Self Care | Payer: Medicaid Other | Attending: Internal Medicine | Admitting: Internal Medicine

## 2020-12-17 ENCOUNTER — Emergency Department (HOSPITAL_COMMUNITY): Payer: Medicaid Other

## 2020-12-17 ENCOUNTER — Encounter (HOSPITAL_COMMUNITY): Payer: Self-pay

## 2020-12-17 DIAGNOSIS — R112 Nausea with vomiting, unspecified: Secondary | ICD-10-CM | POA: Diagnosis present

## 2020-12-17 DIAGNOSIS — E1065 Type 1 diabetes mellitus with hyperglycemia: Secondary | ICD-10-CM | POA: Diagnosis not present

## 2020-12-17 DIAGNOSIS — R109 Unspecified abdominal pain: Secondary | ICD-10-CM | POA: Diagnosis present

## 2020-12-17 DIAGNOSIS — F17210 Nicotine dependence, cigarettes, uncomplicated: Secondary | ICD-10-CM | POA: Insufficient documentation

## 2020-12-17 DIAGNOSIS — Z79899 Other long term (current) drug therapy: Secondary | ICD-10-CM | POA: Diagnosis not present

## 2020-12-17 DIAGNOSIS — K3184 Gastroparesis: Secondary | ICD-10-CM | POA: Diagnosis not present

## 2020-12-17 DIAGNOSIS — I160 Hypertensive urgency: Secondary | ICD-10-CM | POA: Diagnosis not present

## 2020-12-17 DIAGNOSIS — Z794 Long term (current) use of insulin: Secondary | ICD-10-CM | POA: Insufficient documentation

## 2020-12-17 DIAGNOSIS — Z20822 Contact with and (suspected) exposure to covid-19: Secondary | ICD-10-CM | POA: Insufficient documentation

## 2020-12-17 DIAGNOSIS — F1210 Cannabis abuse, uncomplicated: Secondary | ICD-10-CM

## 2020-12-17 LAB — COMPREHENSIVE METABOLIC PANEL
ALT: 15 U/L (ref 0–44)
AST: 24 U/L (ref 15–41)
Albumin: 4.2 g/dL (ref 3.5–5.0)
Alkaline Phosphatase: 87 U/L (ref 38–126)
Anion gap: 11 (ref 5–15)
BUN: 13 mg/dL (ref 6–20)
CO2: 22 mmol/L (ref 22–32)
Calcium: 9.3 mg/dL (ref 8.9–10.3)
Chloride: 108 mmol/L (ref 98–111)
Creatinine, Ser: 1.15 mg/dL (ref 0.61–1.24)
GFR, Estimated: >60 mL/min (ref >60)
Glucose, Bld: 211 mg/dL — ABNORMAL HIGH (ref 70–99)
Potassium: 4 mmol/L (ref 3.5–5.1)
Sodium: 141 mmol/L (ref 135–145)
Total Bilirubin: 0.5 mg/dL (ref 0.3–1.2)
Total Protein: 7.5 g/dL (ref 6.5–8.1)

## 2020-12-17 LAB — CBC WITH DIFFERENTIAL/PLATELET
Abs Immature Granulocytes: 0.01 10*3/uL (ref 0.00–0.07)
Basophils Absolute: 0.1 10*3/uL (ref 0.0–0.1)
Basophils Relative: 1 %
Eosinophils Absolute: 0.4 10*3/uL (ref 0.0–0.5)
Eosinophils Relative: 6 %
HCT: 42.7 % (ref 39.0–52.0)
Hemoglobin: 14.1 g/dL (ref 13.0–17.0)
Immature Granulocytes: 0 %
Lymphocytes Relative: 42 %
Lymphs Abs: 3.3 10*3/uL (ref 0.7–4.0)
MCH: 27.1 pg (ref 26.0–34.0)
MCHC: 33 g/dL (ref 30.0–36.0)
MCV: 82.1 fL (ref 80.0–100.0)
Monocytes Absolute: 0.5 10*3/uL (ref 0.1–1.0)
Monocytes Relative: 6 %
Neutro Abs: 3.6 10*3/uL (ref 1.7–7.7)
Neutrophils Relative %: 45 %
Platelets: 304 10*3/uL (ref 150–400)
RBC: 5.2 MIL/uL (ref 4.22–5.81)
RDW: 14.2 % (ref 11.5–15.5)
WBC: 7.9 10*3/uL (ref 4.0–10.5)
nRBC: 0 % (ref 0.0–0.2)

## 2020-12-17 LAB — RAPID URINE DRUG SCREEN, HOSP PERFORMED
Amphetamines: NOT DETECTED
Barbiturates: NOT DETECTED
Benzodiazepines: NOT DETECTED
Cocaine: NOT DETECTED
Opiates: NOT DETECTED
Tetrahydrocannabinol: POSITIVE — AB

## 2020-12-17 LAB — GLUCOSE, CAPILLARY
Glucose-Capillary: 111 mg/dL — ABNORMAL HIGH (ref 70–99)
Glucose-Capillary: 130 mg/dL — ABNORMAL HIGH (ref 70–99)
Glucose-Capillary: 188 mg/dL — ABNORMAL HIGH (ref 70–99)
Glucose-Capillary: 62 mg/dL — ABNORMAL LOW (ref 70–99)

## 2020-12-17 LAB — LIPASE, BLOOD: Lipase: 22 U/L (ref 11–51)

## 2020-12-17 LAB — RESP PANEL BY RT-PCR (FLU A&B, COVID) ARPGX2
Influenza A by PCR: NEGATIVE
Influenza B by PCR: NEGATIVE
SARS Coronavirus 2 by RT PCR: NEGATIVE

## 2020-12-17 LAB — HEMOGLOBIN A1C
Hgb A1c MFr Bld: 8.7 % — ABNORMAL HIGH (ref 4.8–5.6)
Mean Plasma Glucose: 202.99 mg/dL

## 2020-12-17 MED ORDER — FENTANYL CITRATE (PF) 100 MCG/2ML IJ SOLN
50.0000 ug | Freq: Once | INTRAMUSCULAR | Status: AC
Start: 2020-12-17 — End: 2020-12-17
  Administered 2020-12-17: 50 ug via INTRAVENOUS
  Filled 2020-12-17: qty 2

## 2020-12-17 MED ORDER — LORAZEPAM 2 MG/ML IJ SOLN
0.5000 mg | Freq: Once | INTRAMUSCULAR | Status: AC
  Administered 2020-12-17: 0.5 mg via INTRAVENOUS
  Filled 2020-12-17: qty 1

## 2020-12-17 MED ORDER — SODIUM CHLORIDE 0.9 % IV BOLUS
2000.0000 mL | Freq: Once | INTRAVENOUS | Status: AC
  Administered 2020-12-17: 2000 mL via INTRAVENOUS

## 2020-12-17 MED ORDER — HYDROCODONE-ACETAMINOPHEN 5-325 MG PO TABS
1.0000 | ORAL_TABLET | ORAL | Status: DC | PRN
  Administered 2020-12-17 – 2020-12-18 (×2): 1 via ORAL
  Filled 2020-12-17 (×2): qty 1

## 2020-12-17 MED ORDER — METOCLOPRAMIDE HCL 5 MG/ML IJ SOLN
5.0000 mg | Freq: Once | INTRAMUSCULAR | Status: AC
  Administered 2020-12-17: 5 mg via INTRAVENOUS
  Filled 2020-12-17: qty 2

## 2020-12-17 MED ORDER — MORPHINE SULFATE (PF) 2 MG/ML IV SOLN
2.0000 mg | INTRAVENOUS | Status: DC | PRN
  Administered 2020-12-17: 2 mg via INTRAVENOUS
  Filled 2020-12-17: qty 1

## 2020-12-17 MED ORDER — ACETAMINOPHEN 325 MG PO TABS: 650.0000 mg | ORAL_TABLET | Freq: Four times a day (QID) | ORAL | Status: DC | PRN

## 2020-12-17 MED ORDER — SODIUM CHLORIDE 0.9 % IV SOLN: INTRAVENOUS | Status: DC

## 2020-12-17 MED ORDER — SODIUM CHLORIDE 0.9 % IV SOLN: INTRAVENOUS | Status: AC

## 2020-12-17 MED ORDER — POLYETHYLENE GLYCOL 3350 17 G PO PACK
17.0000 g | PACK | Freq: Every day | ORAL | Status: DC | PRN
Start: 2020-12-17 — End: 2020-12-18

## 2020-12-17 MED ORDER — AMLODIPINE BESYLATE 5 MG PO TABS
5.0000 mg | ORAL_TABLET | Freq: Every day | ORAL | Status: DC
  Administered 2020-12-17 – 2020-12-18 (×2): 5 mg via ORAL
  Filled 2020-12-17 (×2): qty 1

## 2020-12-17 MED ORDER — INSULIN ASPART 100 UNIT/ML ~~LOC~~ SOLN
10.0000 [IU] | Freq: Three times a day (TID) | SUBCUTANEOUS | Status: DC
  Filled 2020-12-17: qty 0.1

## 2020-12-17 MED ORDER — INSULIN ASPART 100 UNIT/ML ~~LOC~~ SOLN: 0.0000 [IU] | Freq: Three times a day (TID) | SUBCUTANEOUS | Status: DC

## 2020-12-17 MED ORDER — INSULIN GLARGINE 100 UNIT/ML ~~LOC~~ SOLN
12.0000 [IU] | Freq: Every day | SUBCUTANEOUS | Status: DC
  Administered 2020-12-18: 12 [IU] via SUBCUTANEOUS
  Filled 2020-12-17 (×2): qty 0.12

## 2020-12-17 MED ORDER — METOCLOPRAMIDE HCL 5 MG/ML IJ SOLN
5.0000 mg | Freq: Four times a day (QID) | INTRAMUSCULAR | Status: DC | PRN
  Administered 2020-12-17 – 2020-12-18 (×2): 5 mg via INTRAVENOUS
  Filled 2020-12-17 (×2): qty 2

## 2020-12-17 MED ORDER — INSULIN ASPART 100 UNIT/ML ~~LOC~~ SOLN
10.0000 [IU] | Freq: Three times a day (TID) | SUBCUTANEOUS | Status: DC
  Administered 2020-12-17: 10 [IU] via SUBCUTANEOUS

## 2020-12-17 MED ORDER — FENTANYL CITRATE (PF) 100 MCG/2ML IJ SOLN: 100.0000 ug | Freq: Once | INTRAMUSCULAR | Status: DC

## 2020-12-17 MED ORDER — ACETAMINOPHEN 650 MG RE SUPP: 650.0000 mg | Freq: Four times a day (QID) | RECTAL | Status: DC | PRN

## 2020-12-17 NOTE — Progress Notes (Signed)
Patient and visitor were smoking something in the room.  Both denied that they were smoking.  You could smell it coming from the room and the window was open.  I told them they could not smoke in the room.  This was a warning, and the next time security can be called according to the [facility].

## 2020-12-17 NOTE — ED Provider Notes (Signed)
[facility] DEPT Provider Note   CSN: [id number] Arrival date & time: 12/17/20  2440     History Chief Complaint  Patient presents with  . Abdominal Pain    Jonathan Porter is a 30 y.o. male.  30 year old male with history of gastroparesis and diabetes presents with ongoing abdominal pain and emesis.  Patient seen here twice with the last several days for similar symptoms.  States that going to the house of symptoms is fentanyl.  Denies any fever or chills.  Denies any use of THC.  No diarrhea.  No treatment use prior to arrival        Past Medical History:  Diagnosis Date  . Diabetes mellitus without complication ([place])   . Gastroparesis     Patient Active Problem List   Diagnosis Date Noted  . Intractable nausea and vomiting 07/03/2019  . Gastroparesis 07/02/2019  . Influenza vaccine refused 06/03/2019  . Erectile dysfunction 06/03/2019  . Obesity (BMI 30-39.9) 06/03/2019  . Type 1 diabetes mellitus with diabetic polyneuropathy ([place]) 01/21/2019  . Type 1 diabetes mellitus with hyperglycemia ([place]) 01/21/2019  . Paronychia of finger of left hand 01/20/2017  . 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 Diabetes type 1, uncontrolled ([place]) 08/23/2015  . Onychomycosis of toenail 08/23/2015  . Phimosis 08/07/2014    Past Surgical History:  Procedure Laterality Date  . NO PAST SURGERIES         Family History  Problem Relation Age of Onset  . Thyroid disease Mother   . Diabetes Maternal Aunt   . Diabetes Maternal Grandmother   . Diabetes Maternal Grandfather   . Thyroid disease Other     Social History   Tobacco Use  . Smoking status: Current Every Day Smoker    Packs/day: 0.50    Years: 6.00    Pack years: 3.00    Types: Cigarettes  . Smokeless tobacco: Never Used  Vaping Use  . Vaping Use: Never used  Substance Use Topics  . Alcohol use: Yes    Comment: social   . Drug use: Yes    Types: Marijuana     Home Medications Prior to Admission medications   Medication Sig Start Date End Date Taking? Authorizing Provider  Accu-Chek FastClix Lancets MISC Use to check blood sugar up to 3 times daily. 06/03/19   Ladell Pier, MD  blood glucose meter kit and supplies KIT Dispense based on patient and insurance preference. Use up to four times daily as directed. (FOR ICD-9 250.00, 250.01). 12/01/19   Argentina Donovan, PA-C  Blood Glucose Monitoring Suppl (ACCU-CHEK GUIDE ME) w/Device KIT 1 kit by Does not apply route 3 (three) times daily. Use to check blood sugar up to 3 times daily. 06/03/19   Ladell Pier, MD  dicyclomine (BENTYL) 20 MG tablet Take 1 tablet (20 mg total) by mouth every 8 (eight) hours as needed for spasms. Patient not taking: No sig reported 08/06/20   Pati Gallo S, PA  glucose blood (ACCU-CHEK GUIDE) test strip USE AS DIRECTED UP TO FOUR TIMES DAILY Patient not taking: No sig reported 09/28/20   Ladell Pier, MD  insulin aspart (NOVOLOG FLEXPEN) 100 UNIT/ML FlexPen Inject 15 Units into the skin 3 (three) times daily with meals. 10/16/20   Ladell Pier, MD  insulin glargine (LANTUS) 100 UNIT/ML Solostar Pen Inject 15 Units into the skin daily. 10/16/20   Ladell Pier, MD  metoCLOPramide (REGLAN) 10 MG  tablet Take 1 tablet (10 mg total) by mouth every 8 (eight) hours as needed for nausea. Patient not taking: No sig reported 08/06/20   Pati Gallo S, PA  ondansetron (ZOFRAN ODT) 4 MG disintegrating tablet Take 1 tablet (4 mg total) by mouth every 8 (eight) hours as needed for nausea or vomiting. Patient not taking: No sig reported 12/15/20   Caccavale, Sophia, PA-C  sildenafil (VIAGRA) 50 MG tablet TAKE ONE TABLET BY MOUTH 30 MINUTES PRIOR TO SEXUAL INTERCOURSE AS NEEDED. LIMIT 1 IN 24 HOURS 10/16/20   Ladell Pier, MD  TRUEPLUS 5-BEVEL PEN NEEDLES 31G X 8 MM MISC USE AS DIRECTED UP TO FOUR TIMES DAILY 01/05/20   Ladell Pier, MD  PROMETHAZINE  HCL PO Take 1 tablet by mouth every 6 (six) hours as needed (nausea/vomitting).  02/23/20  [provider]  sucralfate (CARAFATE) 1 g tablet Take 1 tablet (1 g total) by mouth 4 (four) times daily for 14 days. Patient not taking: Reported on 02/23/2020 10/01/19 02/23/20  McDonald, Mia A, PA-C    Allergies    Shellfish allergy  Review of Systems   Review of Systems  All other systems reviewed and are negative.   Physical Exam Updated Vital Signs BP (!) 152/98 (BP Location: Left Arm)   Pulse 68   Temp 97.6 F (36.4 C) (Oral)   Resp 20   SpO2 100%   Physical Exam Vitals and nursing note reviewed.  Constitutional:      General: He is not in acute distress.    Appearance: Normal appearance. He is well-developed. He is not toxic-appearing.  HENT:     Head: Normocephalic and atraumatic.  Eyes:     General: Lids are normal.     Conjunctiva/sclera: Conjunctivae normal.     Pupils: Pupils are equal, round, and reactive to light.  Neck:     Thyroid: No thyroid mass.     Trachea: No tracheal deviation.  Cardiovascular:     Rate and Rhythm: Normal rate and regular rhythm.     Heart sounds: Normal heart sounds. No murmur heard. No gallop.   Pulmonary:     Effort: Pulmonary effort is normal. No respiratory distress.     Breath sounds: Normal breath sounds. No stridor. No decreased breath sounds, wheezing, rhonchi or rales.  Abdominal:     General: Bowel sounds are normal. There is no distension.     Palpations: Abdomen is soft.     Tenderness: There is no abdominal tenderness. There is no rebound.  Musculoskeletal:        General: No tenderness. Normal range of motion.     Cervical back: Normal range of motion and neck supple.  Skin:    General: Skin is warm and dry.     Findings: No abrasion or rash.  Neurological:     Mental Status: He is alert and oriented to person, place, and time.     GCS: GCS eye subscore is 4. GCS verbal subscore is 5. GCS motor subscore is 6.      Cranial Nerves: No cranial nerve deficit.     Sensory: No sensory deficit.  Psychiatric:        Speech: Speech normal.        Behavior: Behavior normal.     ED Results / Procedures / Treatments   Labs (all labs ordered are listed, but only abnormal results are displayed) Labs Reviewed  RESP PANEL BY RT-PCR (FLU A&B, COVID) ARPGX2  CBC WITH  DIFFERENTIAL/PLATELET  COMPREHENSIVE METABOLIC PANEL  LIPASE, BLOOD  RAPID URINE DRUG SCREEN, HOSP PERFORMED    EKG None  Radiology No results found.  Procedures Procedures   Medications Ordered in ED Medications  sodium chloride 0.9 % bolus 2,000 mL (has no administration in time range)  0.9 %  sodium chloride infusion (has no administration in time range)  LORazepam (ATIVAN) injection 0.5 mg (0.5 mg Intravenous Given 12/17/20 0857)  metoCLOPramide (REGLAN) injection 5 mg (5 mg Intravenous Given 12/17/20 0857)  fentaNYL (SUBLIMAZE) injection 50 mcg (50 mcg Intravenous Given 12/17/20 0856)    ED Course  I have reviewed the triage vital signs and the nursing notes.  Pertinent labs & imaging results that were available during my care of the patient were reviewed by me and considered in my medical decision making (see chart for details).    MDM Rules/Calculators/A&P                          Patient given 2 rounds of medications here continues to have pain and emesis.  Abdominal series shows constipation but no signs obstruction.  Labs are reassuring here.  Will consult for admission Final Clinical Impression(s) / ED Diagnoses Final diagnoses:  None    Rx / DC Orders ED Discharge Orders    None       Lacretia Leigh, MD 12/17/20 1034

## 2020-12-17 NOTE — H&P (Addendum)
History and Physical        Hospital Admission Note Date: 12/17/2020  Patient name: Jonathan Porter Medical record number: [id number] Date of birth: Oct 10, 1990 Age: 30 y.o. Gender: male  PCP: Ladell Pier, MD    Chief Complaint    Chief Complaint  Patient presents with  . Abdominal Pain      HPI:   This is a 30 year old male with past medical history of type 1 diabetes, gastroparesis, obesity, marijuana use who presented to the ED with intermittent generalized abdominal pain and vomiting for several days which recurred this morning.  He has been seen multiple times in the ED over the past week for similar complaints.  This is similar to prior gastroparesis episodes.  States that he does smoke marijuana daily, most recently on Friday.  Also is noncompliant with his insulin and will miss his insulin about 4 times weekly.  Denies fever, chills, night sweats or any other complaints.   ED Course: Afebrile, hypertensive, on room air. Notable Labs: Glucose 211, otherwise unremarkable. Notable Imaging: Abdominal XR-diffuse stool throughout the colon, question constipation, no bowel obstruction or free air. Patient received fentanyl, Ativan, Reglan, 2 L NS bolus.  As patient has been seen multiple times in the past week and has not had improvement in symptoms today, hospitalist was requested to admit.    Vitals:   12/17/20 1100 12/17/20 1209  BP: (!) 141/86 (!) 167/129  Pulse: 66 63  Resp: 16 16  Temp:  97.7 F (36.5 C)  SpO2: 100% 100%     Review of Systems:  Review of Systems  All other systems reviewed and are negative.   Medical/Social/Family History   Past Medical History: Past Medical History:  Diagnosis Date  . Diabetes mellitus without complication ([place])   . Gastroparesis     Past Surgical History:  Procedure Laterality Date  . NO PAST SURGERIES       Medications: Prior to Admission medications   Medication Sig Start Date End Date Taking? Authorizing Provider  Accu-Chek FastClix Lancets MISC Use to check blood sugar up to 3 times daily. 06/03/19   Ladell Pier, MD  blood glucose meter kit and supplies KIT Dispense based on patient and insurance preference. Use up to four times daily as directed. (FOR ICD-9 250.00, 250.01). 12/01/19   Argentina Donovan, PA-C  Blood Glucose Monitoring Suppl (ACCU-CHEK GUIDE ME) w/Device KIT 1 kit by Does not apply route 3 (three) times daily. Use to check blood sugar up to 3 times daily. 06/03/19   Ladell Pier, MD  dicyclomine (BENTYL) 20 MG tablet Take 1 tablet (20 mg total) by mouth every 8 (eight) hours as needed for spasms. Patient not taking: No sig reported 08/06/20   Pati Gallo S, PA  glucose blood (ACCU-CHEK GUIDE) test strip USE AS DIRECTED UP TO FOUR TIMES DAILY Patient not taking: No sig reported 09/28/20   Ladell Pier, MD  insulin aspart (NOVOLOG FLEXPEN) 100 UNIT/ML FlexPen Inject 15 Units into the skin 3 (three) times daily with meals. 10/16/20   Ladell Pier, MD  insulin glargine (LANTUS) 100 UNIT/ML Solostar Pen Inject 15 Units into the skin daily. 10/16/20   Wynetta Emery,  Dalbert Batman, MD  metoCLOPramide (REGLAN) 10 MG tablet Take 1 tablet (10 mg total) by mouth every 8 (eight) hours as needed for nausea. Patient not taking: No sig reported 08/06/20   Pati Gallo S, PA  ondansetron (ZOFRAN ODT) 4 MG disintegrating tablet Take 1 tablet (4 mg total) by mouth every 8 (eight) hours as needed for nausea or vomiting. Patient not taking: No sig reported 12/15/20   Caccavale, Sophia, PA-C  sildenafil (VIAGRA) 50 MG tablet TAKE ONE TABLET BY MOUTH 30 MINUTES PRIOR TO SEXUAL INTERCOURSE AS NEEDED. LIMIT 1 IN 24 HOURS 10/16/20   Ladell Pier, MD  TRUEPLUS 5-BEVEL PEN NEEDLES 31G X 8 MM MISC USE AS DIRECTED UP TO FOUR TIMES DAILY 01/05/20   Ladell Pier, MD  PROMETHAZINE HCL  PO Take 1 tablet by mouth every 6 (six) hours as needed (nausea/vomitting).  02/23/20  [provider]  sucralfate (CARAFATE) 1 g tablet Take 1 tablet (1 g total) by mouth 4 (four) times daily for 14 days. Patient not taking: Reported on 02/23/2020 10/01/19 02/23/20  McDonald, Maree Erie A, PA-C    Allergies:   Allergies  Allergen Reactions  . Shellfish Allergy Anaphylaxis    Social History:  reports that he has been smoking cigarettes. He has a 3.00 pack-year smoking history. He has never used smokeless tobacco. He reports current alcohol use. He reports current drug use. Drug: Marijuana.  Family History: Family History  Problem Relation Age of Onset  . Thyroid disease Mother   . Diabetes Maternal Aunt   . Diabetes Maternal Grandmother   . Diabetes Maternal Grandfather   . Thyroid disease Other      Objective   Physical Exam: Blood pressure (!) 167/129, pulse 63, temperature 97.7 F (36.5 C), temperature source Oral, resp. rate 16, height _0  (1.803 m), weight 103.8 kg, SpO2 100 %.  Physical Exam Vitals and nursing note reviewed.  Constitutional:      Appearance: Normal appearance.  HENT:     Head: Normocephalic and atraumatic.  Eyes:     Conjunctiva/sclera: Conjunctivae normal.  Cardiovascular:     Rate and Rhythm: Normal rate and regular rhythm.  Pulmonary:     Effort: Pulmonary effort is normal.     Breath sounds: Normal breath sounds.  Abdominal:     General: Abdomen is flat.     Palpations: Abdomen is soft.     Tenderness: There is generalized abdominal tenderness.  Musculoskeletal:        General: No swelling or tenderness.  Skin:    Coloration: Skin is not jaundiced or pale.  Neurological:     Mental Status: He is alert. Mental status is at baseline.  Psychiatric:        Mood and Affect: Mood normal.        Behavior: Behavior normal.     LABS on Admission: I have personally reviewed all the labs and imaging below    Basic Metabolic Panel: Recent  Labs  Lab 12/15/20 1353 12/17/20 0937  NA 139 141  K 3.5 4.0  CL 105 108  CO2 23 22  GLUCOSE 262* 211*  BUN 9 13  CREATININE 1.07 1.15  CALCIUM 9.6 9.3   Liver Function Tests: Recent Labs  Lab 12/15/20 1353 12/17/20 0937  AST 20 24  ALT 21 15  ALKPHOS 105 87  BILITOT 0.7 0.5  PROT 8.5* 7.5  ALBUMIN 4.7 4.2   Recent Labs  Lab 12/15/20 1353 12/17/20 0937  LIPASE 21 22  No results for input(s): AMMONIA in the last 168 hours. CBC: Recent Labs  Lab 12/15/20 1353 12/17/20 0937  WBC 7.2 7.9  NEUTROABS 5.1 3.6  HGB 15.3 14.1  HCT 45.6 42.7  MCV 82.3 82.1  PLT 347 304   Cardiac Enzymes: No results for input(s): CKTOTAL, CKMB, CKMBINDEX, TROPONINI in the last 168 hours. BNP: Invalid input(s): POCBNP CBG: Recent Labs  Lab 12/15/20 1255 12/17/20 1221  GLUCAP 214* 188*    Radiological Exams on Admission:  DG ABD ACUTE 2+V W 1V CHEST  Result Date: 12/17/2020 CLINICAL DATA:  Abdominal pain with nausea and vomiting EXAM: DG ABDOMEN ACUTE WITH 1 VIEW CHEST COMPARISON:  CT abdomen and pelvis October 02, 2019; chest radiograph August 30, 2007 FINDINGS: PA chest: Lungs are clear. Heart size and pulmonary vascularity are normal. No adenopathy. Supine and upright abdomen: There is diffuse stool throughout the colon. There is no bowel dilatation or air-fluid level to suggest bowel obstruction. No free air. No abnormal calcifications. IMPRESSION: Diffuse stool throughout colon. Question a degree of underlying constipation. No bowel obstruction or free air. Lungs clear. Electronically Signed   By: Lowella Grip III M.D.   On: 12/17/2020 09:39      EKG: normal EKG, normal sinus rhythm   A & P   Active Problems:   Type 1 diabetes mellitus with hyperglycemia ([facility])   Gastroparesis   Intractable nausea and vomiting   Marijuana abuse   1. 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 Continue IV fluids b. Continue Reglan every 6 hours as needed c. Continue pain management d. Clear liquid diet, advance as tolerated e. Advised to stick to insulin regimen as prescribed and avoid marijuana use  2. Type 1 diabetes with hyperglycemia a. Noncompliant with medications b. Check HbA1c c. Reduce insulin dosing while inpatient for now while on clear liquid diet: Lantus 12 units daily, NovoLog 10 units 3 times daily with meals  3. Marijuana use a. Advised cessation  4. Hypertensive urgency a. Add on amlodipine     DVT prophylaxis: SCD   Code Status: Full Code  Diet: Clear liquid diet Family Communication: Admission, patients condition and plan of care including tests being ordered have been discussed with the patient who indicates understanding and agrees with the plan and Code Status.   Disposition Plan: The appropriate patient status for this patient is OBSERVATION. Observation status is judged to be reasonable and necessary in order to provide the required intensity of service to ensure the patient's safety. The patient's presenting symptoms, physical exam findings, and initial radiographic and laboratory data in the context of their medical condition is felt to place them at decreased risk for further clinical deterioration. Furthermore, it is anticipated that the patient will be medically stable for discharge from the hospital within 2 midnights of admission. The following factors support the patient status of observation.   " The patient's presenting symptoms include abdominal pain, nausea and vomiting. " The physical exam findings include abdominal discomfort. " The initial radiographic and laboratory data are unremarkable.    Status is: Observation  The patient remains OBS appropriate and will d/c before 2 midnights.  Dispo: The patient is from: Home              Anticipated d/c is to: Home              Patient currently is not medically stable to  d/c.   Difficult to place  patient No       Consultants  . None  Procedures  . None  Time Spent on Admission: 56 minutes    Harold Hedge, DO [facility]  12/17/2020, 12:26 PM

## 2020-12-17 NOTE — Progress Notes (Addendum)
Called by RN and notified that blood sugar was 62 an hour ago. Given 4 oz of juice and blood sugar recheck 30 minutes ago was 71.  Asked RN to get a blood sugar now and page me with results so can decide if more intervention needed.  Pt is DMT1 and on lantus and meal time insulin.   Addendum: BS is 130 now. Recheck in one hour

## 2020-12-17 NOTE — Progress Notes (Signed)
Hypoglycemic Event  CBG: 62  Treatment: 4 oz of juice  Symptoms: None Follow-up CBG: Time:2215 CBG Result:71  Possible Reasons for Event: Inadequate po intake   Comments/MD notified: MD notified    Jonathan Porter  Katrinka Blazing

## 2020-12-17 NOTE — ED Notes (Signed)
Patient much calmer now, lying in bed, no apparent distress noted

## 2020-12-17 NOTE — Progress Notes (Signed)
Pt reports nausea and 10/10 pain in abdomen. PRN medications administered, pt resting at this time and reports pain comes "in waves". Will continue to monitor.

## 2020-12-17 NOTE — ED Triage Notes (Signed)
Abdominal pain x 3 days, seen here twice on 3-26 for same, patient states he needs fentanyl.

## 2020-12-18 DIAGNOSIS — E1065 Type 1 diabetes mellitus with hyperglycemia: Secondary | ICD-10-CM | POA: Diagnosis not present

## 2020-12-18 DIAGNOSIS — K3184 Gastroparesis: Secondary | ICD-10-CM | POA: Diagnosis not present

## 2020-12-18 DIAGNOSIS — R112 Nausea with vomiting, unspecified: Secondary | ICD-10-CM | POA: Diagnosis not present

## 2020-12-18 DIAGNOSIS — F1210 Cannabis abuse, uncomplicated: Secondary | ICD-10-CM | POA: Diagnosis not present

## 2020-12-18 LAB — BASIC METABOLIC PANEL
Anion gap: 8 (ref 5–15)
BUN: 8 mg/dL (ref 6–20)
CO2: 25 mmol/L (ref 22–32)
Calcium: 8.6 mg/dL — ABNORMAL LOW (ref 8.9–10.3)
Chloride: 106 mmol/L (ref 98–111)
Creatinine, Ser: 0.97 mg/dL (ref 0.61–1.24)
GFR, Estimated: >60 mL/min (ref >60)
Glucose, Bld: 131 mg/dL — ABNORMAL HIGH (ref 70–99)
Potassium: 3.2 mmol/L — ABNORMAL LOW (ref 3.5–5.1)
Sodium: 139 mmol/L (ref 135–145)

## 2020-12-18 LAB — GLUCOSE, CAPILLARY
Glucose-Capillary: 104 mg/dL — ABNORMAL HIGH (ref 70–99)
Glucose-Capillary: 114 mg/dL — ABNORMAL HIGH (ref 70–99)
Glucose-Capillary: 120 mg/dL — ABNORMAL HIGH (ref 70–99)
Glucose-Capillary: 122 mg/dL — ABNORMAL HIGH (ref 70–99)
Glucose-Capillary: 137 mg/dL — ABNORMAL HIGH (ref 70–99)
Glucose-Capillary: 257 mg/dL — ABNORMAL HIGH (ref 70–99)
Glucose-Capillary: 71 mg/dL (ref 70–99)

## 2020-12-18 LAB — CBC
HCT: 40 % (ref 39.0–52.0)
Hemoglobin: 13 g/dL (ref 13.0–17.0)
MCH: 27.3 pg (ref 26.0–34.0)
MCHC: 32.5 g/dL (ref 30.0–36.0)
MCV: 83.9 fL (ref 80.0–100.0)
Platelets: 258 10*3/uL (ref 150–400)
RBC: 4.77 MIL/uL (ref 4.22–5.81)
RDW: 14.4 % (ref 11.5–15.5)
WBC: 8.6 10*3/uL (ref 4.0–10.5)
nRBC: 0 % (ref 0.0–0.2)

## 2020-12-18 LAB — HIV ANTIBODY (ROUTINE TESTING W REFLEX): HIV Screen 4th Generation wRfx: NONREACTIVE

## 2020-12-18 MED ORDER — DICYCLOMINE HCL 20 MG PO TABS: 20.0000 mg | ORAL_TABLET | Freq: Three times a day (TID) | ORAL | 0 refills | Status: DC | PRN

## 2020-12-18 MED ORDER — POTASSIUM CHLORIDE 10 MEQ/100ML IV SOLN
10.0000 meq | INTRAVENOUS | Status: AC
  Administered 2020-12-18 (×2): 10 meq via INTRAVENOUS
  Filled 2020-12-18: qty 100

## 2020-12-18 MED ORDER — METOCLOPRAMIDE HCL 10 MG PO TABS: 10.0000 mg | ORAL_TABLET | Freq: Three times a day (TID) | ORAL | 0 refills | Status: DC | PRN

## 2020-12-18 NOTE — Discharge Summary (Signed)
Physician Discharge Summary  Jonathan Porter DBZ:[id number] DOB: 1991-05-14 DOA: 12/17/2020  PCP: Ladell Pier, MD  Admit date: 12/17/2020 Discharge date: 12/18/2020  Admitted From: Home  Discharge disposition: Home  Recommendations for Outpatient Follow-Up:   . Follow up with your primary care provider in one week.  . Check CBC, BMP, magnesium in the next visit  Discharge Diagnosis:   Active Problems:   Type 1 diabetes mellitus with hyperglycemia (HCC)   Gastroparesis   Intractable nausea and vomiting   Marijuana abuse   Discharge Condition: Improved.  Diet recommendation: Carbohydrate-modified.    Wound care: None.  Code status: Full.   History of Present Illness:  Patient is a 30 year old male with past medical history of type 1 diabetes, gastroparesis, obesity, marijuana use who presented to the ED with intermittent generalized abdominal pain and vomiting for several days which recurred on the morning of presentation.  Patient does have history of gastroparesis but continues to use marijuana.  In the ED patient was noted to be mildly hyperglycemic.  Abdominal x-ray showed diffuse stool throughout the colon. Patient received fentanyl, Ativan, Reglan, 2 L NS bolus.  Patient was then placed in observation  Hospital Course:   Following conditions were addressed during hospitalization as listed below,  Abdominal pain, intractable nausea and vomiting, suspect diabetic gastroparesis versus cannabis hyperemesis syndrome Patient was continued on antiemetics, IV fluids analgesics.  He was gradually advanced on diet.  Patient will be given Reglan on discharge.  Patient was encouraged not to smoke and follow-up with his primary care physician as outpatient.  Type 1 diabetes with hyperglycemia History of noncompliance with medication.  Continue insulin regimen on discharge.  Marijuana use Patient was advised cessation of marijuana.  Hypertensive urgency Resume  amlodipine  Disposition.  At this time, patient is stable for disposition home with outpatient PCP follow-up.  Medical Consultants:    None.  Procedures:    None Subjective:   Today, patient was seen and examined at bedside.  Patient feels better with abdominal pain and nausea and vomiting.  Wishes to go home  Discharge Exam:   Vitals:   12/17/20 2140 12/18/20 0500  BP: 125/82 137/72  Pulse: 78 69  Resp: 18 18  Temp: 98 F (36.7 C) 97.8 F (36.6 C)  SpO2: 100% 100%   Vitals:   12/17/20 1209 12/17/20 1742 12/17/20 2140 12/18/20 0500  BP: (!) 167/129 135/87 125/82 137/72  Pulse: 63 67 78 69  Resp: 16 18 18 18   Temp: 97.7 F (36.5 C) 98.3 F (36.8 C) 98 F (36.7 C) 97.8 F (36.6 C)  TempSrc: Oral Oral Oral Oral  SpO2: 100% 95% 100% 100%  Weight:      Height:       General: Alert awake, not in obvious distress HENT: pupils equally reacting to light,  No scleral pallor or icterus noted. Oral mucosa is moist.  Chest:  Clear breath sounds.  Diminished breath sounds bilaterally. No crackles or wheezes.  CVS: S1 &S2 heard. No murmur.  Regular rate and rhythm. Abdomen: Soft, nontender, nondistended.  Bowel sounds are heard.   Extremities: No cyanosis, clubbing or edema.  Peripheral pulses are palpable. Psych: Alert, awake and oriented, normal mood CNS:  No cranial nerve deficits.  Power equal in all extremities.   Skin: Warm and dry.  No rashes noted.  The results of significant diagnostics from this hospitalization (including imaging, microbiology, ancillary and laboratory) are listed below for reference.     Diagnostic  Studies:   DG ABD ACUTE 2+V W 1V CHEST  Result Date: 12/17/2020 CLINICAL DATA:  Abdominal pain with nausea and vomiting EXAM: DG ABDOMEN ACUTE WITH 1 VIEW CHEST COMPARISON:  CT abdomen and pelvis October 02, 2019; chest radiograph August 30, 2007 FINDINGS: PA chest: Lungs are clear. Heart size and pulmonary vascularity are normal. No adenopathy.  Supine and upright abdomen: There is diffuse stool throughout the colon. There is no bowel dilatation or air-fluid level to suggest bowel obstruction. No free air. No abnormal calcifications. IMPRESSION: Diffuse stool throughout colon. Question a degree of underlying constipation. No bowel obstruction or free air. Lungs clear. Electronically Signed   By: Lowella Grip III M.D.   On: 12/17/2020 09:39     Labs:   Basic Metabolic Panel: Recent Labs  Lab 12/15/20 1353 12/17/20 0937 12/18/20 0448  NA 139 141 139  K 3.5 4.0 3.2*  CL 105 108 106  CO2 23 22 25   GLUCOSE 262* 211* 131*  BUN 9 13 8   CREATININE 1.07 1.15 0.97  CALCIUM 9.6 9.3 8.6*   GFR Estimated Creatinine Clearance: 135.3 mL/min (by C-G formula based on SCr of 0.97 mg/dL). Liver Function Tests: Recent Labs  Lab 12/15/20 1353 12/17/20 0937  AST 20 24  ALT 21 15  ALKPHOS 105 87  BILITOT 0.7 0.5  PROT 8.5* 7.5  ALBUMIN 4.7 4.2   Recent Labs  Lab 12/15/20 1353 12/17/20 0937  LIPASE 21 22   No results for input(s): AMMONIA in the last 168 hours. Coagulation profile No results for input(s): INR, PROTIME in the last 168 hours.  CBC: Recent Labs  Lab 12/15/20 1353 12/17/20 0937 12/18/20 0448  WBC 7.2 7.9 8.6  NEUTROABS 5.1 3.6  --   HGB 15.3 14.1 13.0  HCT 45.6 42.7 40.0  MCV 82.3 82.1 83.9  PLT 347 304 258   Cardiac Enzymes: No results for input(s): CKTOTAL, CKMB, CKMBINDEX, TROPONINI in the last 168 hours. BNP: Invalid input(s): POCBNP CBG: Recent Labs  Lab 12/18/20 0042 12/18/20 0146 12/18/20 0248 12/18/20 0744 12/18/20 1130  GLUCAP 122* 120* 114* 137* 257*   D-Dimer No results for input(s): DDIMER in the last 72 hours. Hgb A1c Recent Labs    12/17/20 0937  HGBA1C 8.7*   Lipid Profile No results for input(s): CHOL, HDL, LDLCALC, TRIG, CHOLHDL, LDLDIRECT in the last 72 hours. Thyroid function studies No results for input(s): TSH, T4TOTAL, T3FREE, THYROIDAB in the last 72  hours.  Invalid input(s): FREET3 Anemia work up No results for input(s): VITAMINB12, FOLATE, FERRITIN, TIBC, IRON, RETICCTPCT in the last 72 hours. Microbiology Recent Results (from the past 240 hour(s))  Resp Panel by RT-PCR (Flu A&B, Covid) Nasopharyngeal Swab     Status: None   Collection Time: 12/17/20  8:47 AM   Specimen: Nasopharyngeal Swab; Nasopharyngeal(NP) swabs in vial transport medium  Result Value Ref Range Status   SARS Coronavirus 2 by RT PCR NEGATIVE NEGATIVE Final    Comment: (NOTE) SARS-CoV-2 target nucleic acids are NOT DETECTED.  The SARS-CoV-2 RNA is generally detectable in upper respiratory specimens during the acute phase of infection. The lowest concentration of SARS-CoV-2 viral copies this assay can detect is 138 copies/mL. A negative result does not preclude SARS-Cov-2 infection and should not be used as the sole basis for treatment or other patient management decisions. A negative result may occur with  improper specimen collection/handling, submission of specimen other than nasopharyngeal swab, presence of viral mutation(s) within the areas targeted by this assay,  and inadequate number of viral copies(<138 copies/mL). A negative result must be combined with clinical observations, patient history, and epidemiological information. The expected result is Negative.  Fact Sheet for Patients:  EntrepreneurPulse.com.au  Fact Sheet for Healthcare Providers:  IncredibleEmployment.be  This test is no t yet approved or cleared by the [place] FDA and  has been authorized for detection and/or diagnosis of SARS-CoV-2 by FDA under an Emergency Use Authorization (EUA). This EUA will remain  in effect (meaning this test can be used) for the duration of the COVID-19 declaration under Section 564(b)(1) of the Act, 21 U.S.C.section 360bbb-3(b)(1), unless the authorization is terminated  or revoked sooner.       Influenza A  by PCR NEGATIVE NEGATIVE Final   Influenza B by PCR NEGATIVE NEGATIVE Final    Comment: (NOTE) The Xpert Xpress SARS-CoV-2/FLU/RSV plus assay is intended as an aid in the diagnosis of influenza from Nasopharyngeal swab specimens and should not be used as a sole basis for treatment. Nasal washings and aspirates are unacceptable for Xpert Xpress SARS-CoV-2/FLU/RSV testing.  Fact Sheet for Patients: EntrepreneurPulse.com.au  Fact Sheet for Healthcare Providers: IncredibleEmployment.be  This test is not yet approved or cleared by the [place] FDA and has been authorized for detection and/or diagnosis of SARS-CoV-2 by FDA under an Emergency Use Authorization (EUA). This EUA will remain in effect (meaning this test can be used) for the duration of the COVID-19 declaration under Section 564(b)(1) of the Act, 21 U.S.C. section 360bbb-3(b)(1), unless the authorization is terminated or revoked.  Performed at [facility], [place] [address], [place], [place] [address]      Discharge Instructions:   Discharge Instructions    Call MD for:  persistant nausea and vomiting   Complete by: As directed    Call MD for:  severe uncontrolled pain   Complete by: As directed    Diet - low sodium heart healthy   Complete by: As directed    Discharge instructions   Complete by: As directed    Low-fat diet.  Small meals.  Gradually advance your diet.  Stay on the full liquids today.  Do not drink alcohol or smoke.  Follow-up with your primary care physician in 1 week.   Increase activity slowly   Complete by: As directed      Allergies as of 12/18/2020      Reactions   Shellfish Allergy Anaphylaxis      Medication List    TAKE these medications   Accu-Chek FastClix Lancets Misc Use to check blood sugar up to 3 times daily.   Accu-Chek Guide Me w/Device Kit 1 kit by Does not apply route 3 (three) times daily. Use to check blood sugar  up to 3 times daily.   Accu-Chek Guide test strip Generic drug: glucose blood USE AS DIRECTED UP TO FOUR TIMES DAILY   blood glucose meter kit and supplies Kit Dispense based on patient and insurance preference. Use up to four times daily as directed. (FOR ICD-9 250.00, 250.01).   dicyclomine 20 MG tablet Commonly known as: BENTYL Take 1 tablet (20 mg total) by mouth every 8 (eight) hours as needed for spasms.   insulin glargine 100 UNIT/ML Solostar Pen Commonly known as: LANTUS Inject 15 Units into the skin daily.   metoCLOPramide 10 MG tablet Commonly known as: REGLAN Take 1 tablet (10 mg total) by mouth every 8 (eight) hours as needed for nausea.   NovoLOG FlexPen 100 UNIT/ML FlexPen Generic drug:  insulin aspart Inject 15 Units into the skin 3 (three) times daily with meals.   ondansetron 4 MG disintegrating tablet Commonly known as: Zofran ODT Take 1 tablet (4 mg total) by mouth every 8 (eight) hours as needed for nausea or vomiting.   sildenafil 50 MG tablet Commonly known as: VIAGRA TAKE ONE TABLET BY MOUTH 30 MINUTES PRIOR TO SEXUAL INTERCOURSE AS NEEDED. LIMIT 1 IN 24 HOURS What changed:   how much to take  how to take this  when to take this  reasons to take this  additional instructions   TRUEplus 5-Bevel Pen Needles 31G X 8 MM Misc Generic drug: Insulin Pen Needle USE AS DIRECTED UP TO FOUR TIMES DAILY         Time coordinating discharge: 39 minutes  Signed:  Leshonda Galambos  [facility] 12/18/2020, 11:58 AM

## 2020-12-18 NOTE — Progress Notes (Signed)
Inpatient Diabetes Program Recommendations  AACE/ADA: New Consensus Statement on Inpatient Glycemic Control (2015)  Target Ranges:  Prepandial:   less than 140 mg/dL      Peak postprandial:   less than 180 mg/dL (1-2 hours)      Critically ill patients:  140 - 180 mg/dL   Lab Results  Component Value Date   GLUCAP 137 (H) 12/18/2020   HGBA1C 8.7 (H) 12/17/2020    Review of Glycemic Control Results for Jonathan Porter, Jonathan Porter (MRN [id number]) as of 12/18/2020 09:17  Ref. Range 12/17/2020 12:21 12/17/2020 16:35 12/17/2020 21:45 12/17/2020 22:28 12/17/2020 23:38 12/18/2020 00:42 12/18/2020 01:46 12/18/2020 02:48 12/18/2020 07:44  Glucose-Capillary Latest Ref Range: 70 - 99 mg/dL 537 (H) 482 (H) 62 (L) 71 130 (H) 122 (H) 120 (H) 114 (H) 137 (H)   Diabetes history: DM 2 Outpatient Diabetes medications:  Novolog 15 units tid with meals, Lantus 15 units daily Current orders for Inpatient glycemic control: Novolog 0-6 units tid with meals Novolog 10 units tid with meals Lantus 12 units daily  Inpatient Diabetes Program Recommendations:    Note low blood sugar last night.  Please reduce Novolog meal coverage to 5 units tid with meals (hold if patient eats less than 50% or NPO).    Thanks Beryl Meager, RN, BC-ADM Inpatient Diabetes Coordinator Pager [phone] (8a-5p)

## 2020-12-18 NOTE — Plan of Care (Signed)
Instructions were reviewed with patient. All questions were answered. Patient was transported to main entrance by wheelchair. ° °

## 2020-12-18 NOTE — TOC Initial Note (Signed)
Transition of Care [facility]) - Initial/Assessment Note   Patient Details  Name: Jonathan Porter MRN: [id number] Date of Birth: November 15, 1990  Transition of Care [facility]) CM/SW Contact:    Ewing Schlein, LCSW Phone Number: 12/18/2020, 11:40 AM  Clinical Narrative: Patient is a 30 year old male who is under observation for intractable nausea and vomiting, type 1 diabetes mellitus with hyperglycemia, gastroparesis, and marijuana abuse. Patient requested to speak to [facility] regarding his disability application. Patient asked about medical records and CSW provided patient with phone for medical records requests. TOC to follow for possible discharge needs.  Expected Discharge Plan: Home/Self Care Barriers to Discharge: Continued Medical Work up  Patient Goals and CMS Choice Patient states their goals for this hospitalization and ongoing recovery are:: Discharge home and submit documentation fo disability application Choice offered to / list presented to : NA  Expected Discharge Plan and Services Expected Discharge Plan: Home/Self Care In-house Referral: Clinical Social Work Post Acute Care Choice: NA Living arrangements for the past 2 months: Apartment              DME Arranged: N/A DME Agency: NA  Prior Living Arrangements/Services Living arrangements for the past 2 months: Apartment Patient language and need for interpreter reviewed:: Yes Do you feel safe going back to the place where you live?: Yes      Need for Family Participation in Patient Care: No (Comment) Care giver support system in place?: Yes (comment) Criminal Activity/Legal Involvement Pertinent to Current Situation/Hospitalization: No - Comment as needed  Activities of Daily Living Home Assistive Devices/Equipment: None ADL Screening (condition at time of admission) Patient's cognitive ability adequate to safely complete daily activities?: Yes Is the patient deaf or have difficulty hearing?: No Does the patient have difficulty  seeing, even when wearing glasses/contacts?: No Does the patient have difficulty concentrating, remembering, or making decisions?: No Patient able to express need for assistance with ADLs?: No Does the patient have difficulty dressing or bathing?: No Independently performs ADLs?: Yes (appropriate for developmental age) Does the patient have difficulty walking or climbing stairs?: No Weakness of Legs: None Weakness of Arms/Hands: None  Emotional Assessment Appearance:: Appears stated age Attitude/Demeanor/Rapport: Engaged Affect (typically observed): Accepting Orientation: : Oriented to Self,Oriented to Place,Oriented to  Time,Oriented to Situation Alcohol / Substance Use: Illicit Drugs Psych Involvement: No (comment)  Admission diagnosis:  Intractable nausea and vomiting [R11.2] Abdominal pain, unspecified abdominal location [R10.9] Patient Active Problem List   Diagnosis Date Noted  . Marijuana abuse 12/17/2020  . Intractable nausea and vomiting 07/03/2019  . Gastroparesis 07/02/2019  . Influenza vaccine refused 06/03/2019  . Erectile dysfunction 06/03/2019  . Obesity (BMI 30-39.9) 06/03/2019  . Type 1 diabetes mellitus with diabetic polyneuropathy (HCC) 01/21/2019  . Type 1 diabetes mellitus with hyperglycemia (HCC) 01/21/2019  . Paronychia of finger of left hand 01/20/2017  . 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 Diabetes type 1, uncontrolled (HCC) 08/23/2015  . Onychomycosis of toenail 08/23/2015  . Phimosis 08/07/2014   PCP:  Marcine Matar, MD Pharmacy:   [facility] [address] [place] - [address] [place] [address] Phone: [phone] Fax: [phone]  [facility] - [place], [place] - [address] [place] [address] Phone: [phone] Fax: [phone]  Readmission Risk Interventions No flowsheet data found.

## 2020-12-19 ENCOUNTER — Encounter (HOSPITAL_COMMUNITY): Payer: Self-pay

## 2020-12-19 ENCOUNTER — Emergency Department (HOSPITAL_COMMUNITY)
Admission: EM | Admit: 2020-12-19 | Discharge: 2020-12-19 | Disposition: A | Discharge status: Home / Self Care | Payer: Medicaid Other | Attending: Emergency Medicine | Admitting: Emergency Medicine

## 2020-12-19 ENCOUNTER — Telehealth: Payer: Self-pay

## 2020-12-19 DIAGNOSIS — Z794 Long term (current) use of insulin: Secondary | ICD-10-CM | POA: Diagnosis not present

## 2020-12-19 DIAGNOSIS — R0682 Tachypnea, not elsewhere classified: Secondary | ICD-10-CM | POA: Diagnosis not present

## 2020-12-19 DIAGNOSIS — F17210 Nicotine dependence, cigarettes, uncomplicated: Secondary | ICD-10-CM | POA: Diagnosis not present

## 2020-12-19 DIAGNOSIS — R1013 Epigastric pain: Secondary | ICD-10-CM | POA: Diagnosis present

## 2020-12-19 DIAGNOSIS — E109 Type 1 diabetes mellitus without complications: Secondary | ICD-10-CM | POA: Insufficient documentation

## 2020-12-19 DIAGNOSIS — R110 Nausea: Secondary | ICD-10-CM | POA: Diagnosis not present

## 2020-12-19 LAB — COMPREHENSIVE METABOLIC PANEL
ALT: 15 U/L (ref 0–44)
AST: 19 U/L (ref 15–41)
Albumin: 4.4 g/dL (ref 3.5–5.0)
Alkaline Phosphatase: 92 U/L (ref 38–126)
Anion gap: 12 (ref 5–15)
BUN: 10 mg/dL (ref 6–20)
CO2: 25 mmol/L (ref 22–32)
Calcium: 9.5 mg/dL (ref 8.9–10.3)
Chloride: 103 mmol/L (ref 98–111)
Creatinine, Ser: 1.15 mg/dL (ref 0.61–1.24)
GFR, Estimated: >60 mL/min (ref >60)
Glucose, Bld: 125 mg/dL — ABNORMAL HIGH (ref 70–99)
Potassium: 3.4 mmol/L — ABNORMAL LOW (ref 3.5–5.1)
Sodium: 140 mmol/L (ref 135–145)
Total Bilirubin: 0.9 mg/dL (ref 0.3–1.2)
Total Protein: 7.8 g/dL (ref 6.5–8.1)

## 2020-12-19 LAB — CBG MONITORING, ED: Glucose-Capillary: 116 mg/dL — ABNORMAL HIGH (ref 70–99)

## 2020-12-19 LAB — CBC WITH DIFFERENTIAL/PLATELET
Abs Immature Granulocytes: 0.02 10*3/uL (ref 0.00–0.07)
Basophils Absolute: 0.1 10*3/uL (ref 0.0–0.1)
Basophils Relative: 1 %
Eosinophils Absolute: 0.5 10*3/uL (ref 0.0–0.5)
Eosinophils Relative: 6 %
HCT: 43.9 % (ref 39.0–52.0)
Hemoglobin: 14.4 g/dL (ref 13.0–17.0)
Immature Granulocytes: 0 %
Lymphocytes Relative: 42 %
Lymphs Abs: 3.7 10*3/uL (ref 0.7–4.0)
MCH: 27.1 pg (ref 26.0–34.0)
MCHC: 32.8 g/dL (ref 30.0–36.0)
MCV: 82.7 fL (ref 80.0–100.0)
Monocytes Absolute: 0.6 10*3/uL (ref 0.1–1.0)
Monocytes Relative: 7 %
Neutro Abs: 3.8 10*3/uL (ref 1.7–7.7)
Neutrophils Relative %: 44 %
Platelets: 330 10*3/uL (ref 150–400)
RBC: 5.31 MIL/uL (ref 4.22–5.81)
RDW: 14.1 % (ref 11.5–15.5)
WBC: 8.8 10*3/uL (ref 4.0–10.5)
nRBC: 0 % (ref 0.0–0.2)

## 2020-12-19 LAB — LIPASE, BLOOD: Lipase: 19 U/L (ref 11–51)

## 2020-12-19 MED ORDER — FENTANYL CITRATE (PF) 100 MCG/2ML IJ SOLN
50.0000 ug | Freq: Once | INTRAMUSCULAR | Status: AC
  Administered 2020-12-19: 50 ug via INTRAVENOUS
  Filled 2020-12-19: qty 2

## 2020-12-19 MED ORDER — LORAZEPAM 2 MG/ML IJ SOLN
0.5000 mg | Freq: Once | INTRAMUSCULAR | Status: AC
  Administered 2020-12-19: 0.5 mg via INTRAVENOUS
  Filled 2020-12-19: qty 1

## 2020-12-19 NOTE — Discharge Instructions (Addendum)
You came to the emergency department today to be evaluated for your nausea and epigastric abdominal pain.  You received medication which improved your symptoms.  Your lab work and physical exam were reassuring.  Your symptoms are likely due to gastroparesis from your diabetes or cyclic vomiting related to marijuana use.  Please continue take your medications as prescribed.  Please follow-up with your primary care provider.  I have given you information to follow-up with Dr.Mansouraty with [facility] gastroenterology as this is the provider you saw in 2020.  Get help right away if: You have pain in your chest, neck, arm, or jaw. You feel extremely weak or you faint. You have persistent vomiting. You have vomit that is bright red or looks like black coffee grounds. You have bloody or black stools or stools that look like tar. You have a severe headache, a stiff neck, or both. You have severe pain, cramping, or bloating in your abdomen. You have difficulty breathing, or you are breathing very quickly. Your heart is beating very quickly. Your skin feels cold and clammy. You feel confused. You have signs of dehydration, such as: Dark urine, very little urine, or no urine. Cracked lips. Dry mouth. Sunken eyes. Sleepiness. Weakness.

## 2020-12-19 NOTE — ED Triage Notes (Signed)
Pt arrived via POV, c/o abd pain, n/v. Seen recently for same. No sx. States no improvement at home.

## 2020-12-19 NOTE — Telephone Encounter (Signed)
Transition Care Management Follow-up Telephone Call  Date of discharge and from where: 12/18/2020, [facility].  He was in the ED this morning and had just been discharged when this CM called.   How have you been since you were released from the hospital?he said he is feeling a little better  Any questions or concerns? No  Items Reviewed:  Did the pt receive and understand the discharge instructions provided? Yes   Medications obtained and verified? Yes  - he said he needs a refill of the novolog and test strips and he has the other medications.   Other? No   Any new allergies since your discharge? No   Do you have support at home? Yes   Home Care and Equipment/Supplies: Were home health services ordered? no If so, what is the name of the agency? n/a  Has the agency set up a time to come to the patient's home? not applicable Were any new equipment or medical supplies ordered?  No What is the name of the medical supply agency? n/a Were you able to get the supplies/equipment? not applicable Do you have any questions related to the use of the equipment or supplies? No   He has a working Personnel officer Questionnaire: (I = Independent and D = Dependent) ADLs: independent  Follow up appointments reviewed:   PCP Hospital f/u appt confirmed? Yes  - Dr Laural Benes 01/01/2021.   Specialist Hospital f/u appt confirmed? none scheduled   Are transportation arrangements needed? No   If their condition worsens, is the pt aware to call PCP or go to the Emergency Dept.? Yes  Was the patient provided with contact information for the PCP's office or ED? Yes  Was to pt encouraged to call back with questions or concerns? Yes

## 2020-12-19 NOTE — ED Provider Notes (Signed)
[facility] DEPT Provider Note   CSN: [id number] Arrival date & time: 12/19/20  7824     History Chief Complaint  Patient presents with  . Abdominal Pain    Jonathan Porter is a 30 y.o. male history of diabetes mellitus type I, gastroparesis, marijuana use, intractable nausea and vomiting.  Patient presents today with chief plan of epigastric pain and nausea.  Patient reports that his pain started approximately 0500 this morning.  Pain woke him from sleep.  Patient reports that pain started as a burning sensation which progressed to feeling like he was "being punched in the stomach," at present feels like a squeezing sensation.  Pain has progressively worsened since then.  Patient rates pain 8/10 on the pain scale.  Denies any radiation of his pain.  Denies any alleviating or aggravating factors.  Patient reports that he took his 2 doses of his prescribed Zofran, Reglan, and Bentyl this morning with no relief of his symptoms.  Patient last took medication 1 hour prior to arrival.  Patient states that this pain feels "exactly the same," as pain he has experienced earlier this week when he saw care in the emergency department.  Patient endorses nausea.  Reports that he is had multiple episodes of dry heaving however has not produced any emesis.  Patient reports that he had not had anything to eat aside from dinner the previous evening.  Patient denies any fevers, chills, cough, chest pain, shortness of breath, abdominal distention, constipation, diarrhea, blood in stool, melena, urinary frequency, dysuria, hematuria, decreased urinary output, genital swelling, genital sores or lesions, flank pain, neck pain, stiffness, back pain.  Patient endorses smoking less than half a pack of cigarettes daily, denies any alcohol use, reports he has not smoked marijuana since his symptoms began 12/15/20.  Denies any frequent ibuprofen use,  Per chart review patient patient has been  seen multiple times over the last week for similar complaints.  Most recently patient was seen at [facility] department on 12/17/2020.  Patient had continued pain and emesis despite 2 rounds of medications.  Patient was admitted to the hospital discharged on 12/18/2020 after being advanced on diet.  Patient was given prescription for Reglan on discharge.  HPI     Past Medical History:  Diagnosis Date  . Diabetes mellitus without complication ([place])   . Gastroparesis     Patient Active Problem List   Diagnosis Date Noted  . Marijuana abuse 12/17/2020  . Intractable nausea and vomiting 07/03/2019  . Gastroparesis 07/02/2019  . Influenza vaccine refused 06/03/2019  . Erectile dysfunction 06/03/2019  . Obesity (BMI 30-39.9) 06/03/2019  . Type 1 diabetes mellitus with diabetic polyneuropathy ([place]) 01/21/2019  . Type 1 diabetes mellitus with hyperglycemia ([place]) 01/21/2019  . Paronychia of finger of left hand 01/20/2017  . 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 Diabetes type 1, uncontrolled ([place]) 08/23/2015  . Onychomycosis of toenail 08/23/2015  . Phimosis 08/07/2014    Past Surgical History:  Procedure Laterality Date  . NO PAST SURGERIES         Family History  Problem Relation Age of Onset  . Thyroid disease Mother   . Diabetes Maternal Aunt   . Diabetes Maternal Grandmother   . Diabetes Maternal Grandfather   . Thyroid disease Other     Social History   Tobacco Use  . Smoking status: Current Every Day Smoker    Packs/day: 0.50    Years: 6.00  Pack years: 3.00    Types: Cigarettes  . Smokeless tobacco: Never Used  Vaping Use  . Vaping Use: Never used  Substance Use Topics  . Alcohol use: Yes    Comment: social   . Drug use: Yes    Types: Marijuana    Home Medications Prior to Admission medications   Medication Sig Start Date End Date Taking? Authorizing Provider  Accu-Chek FastClix Lancets MISC Use to check blood sugar up to  3 times daily. 06/03/19   Ladell Pier, MD  blood glucose meter kit and supplies KIT Dispense based on patient and insurance preference. Use up to four times daily as directed. (FOR ICD-9 250.00, 250.01). 12/01/19   Argentina Donovan, PA-C  Blood Glucose Monitoring Suppl (ACCU-CHEK GUIDE ME) w/Device KIT 1 kit by Does not apply route 3 (three) times daily. Use to check blood sugar up to 3 times daily. 06/03/19   Ladell Pier, MD  dicyclomine (BENTYL) 20 MG tablet Take 1 tablet (20 mg total) by mouth every 8 (eight) hours as needed for spasms. 12/18/20   Pokhrel, Laxman, MD  glucose blood (ACCU-CHEK GUIDE) test strip USE AS DIRECTED UP TO FOUR TIMES DAILY 09/28/20   Ladell Pier, MD  insulin aspart (NOVOLOG FLEXPEN) 100 UNIT/ML FlexPen Inject 15 Units into the skin 3 (three) times daily with meals. 10/16/20   Ladell Pier, MD  insulin glargine (LANTUS) 100 UNIT/ML Solostar Pen Inject 15 Units into the skin daily. 10/16/20   Ladell Pier, MD  metoCLOPramide (REGLAN) 10 MG tablet Take 1 tablet (10 mg total) by mouth every 8 (eight) hours as needed for nausea. 12/18/20   Pokhrel, Corrie Mckusick, MD  ondansetron (ZOFRAN ODT) 4 MG disintegrating tablet Take 1 tablet (4 mg total) by mouth every 8 (eight) hours as needed for nausea or vomiting. Patient not taking: No sig reported 12/15/20   Caccavale, Sophia, PA-C  sildenafil (VIAGRA) 50 MG tablet TAKE ONE TABLET BY MOUTH 30 MINUTES PRIOR TO SEXUAL INTERCOURSE AS NEEDED. LIMIT 1 IN 24 HOURS Patient taking differently: Take 50 mg by mouth as needed for erectile dysfunction (30 minutes prior to sexual intercourse as needed. limit 1 in 24hrs). 10/16/20   Ladell Pier, MD  TRUEPLUS 5-BEVEL PEN NEEDLES 31G X 8 MM MISC USE AS DIRECTED UP TO FOUR TIMES DAILY 01/05/20   Ladell Pier, MD  PROMETHAZINE HCL PO Take 1 tablet by mouth every 6 (six) hours as needed (nausea/vomitting).  02/23/20  [provider]  sucralfate (CARAFATE) 1 g tablet  Take 1 tablet (1 g total) by mouth 4 (four) times daily for 14 days. Patient not taking: Reported on 02/23/2020 10/01/19 02/23/20  McDonald, Mia A, PA-C    Allergies    Shellfish allergy  Review of Systems   Review of Systems  Constitutional: Negative for chills and fever.  Eyes: Negative for visual disturbance.  Respiratory: Negative for shortness of breath.   Cardiovascular: Negative for chest pain.  Gastrointestinal: Positive for abdominal pain. Negative for abdominal distention, anal bleeding, blood in stool, constipation, diarrhea, nausea, rectal pain and vomiting.  Genitourinary: Negative for decreased urine volume, difficulty urinating, dysuria, enuresis, flank pain, frequency, genital sores, hematuria, penile discharge, penile pain, penile swelling, scrotal swelling, testicular pain and urgency.  Musculoskeletal: Negative for back pain and neck pain.  Skin: Negative for color change and rash.  Neurological: Negative for dizziness, syncope, light-headedness and headaches.  Psychiatric/Behavioral: Negative for confusion.    Physical Exam Updated Vital  Signs BP (!) 188/163   Pulse 100   Temp 97.9 F (36.6 C) (Oral)   Resp (!) 24   SpO2 100%   Physical Exam Vitals and nursing note reviewed.  Constitutional:      General: He is not in acute distress.    Appearance: He is obese. He is diaphoretic. He is not ill-appearing or toxic-appearing.     Comments: Appears uncomfortable due to complaints of epigastric pain  HENT:     Head: Normocephalic.  Eyes:     General: No scleral icterus.       Right eye: No discharge.        Left eye: No discharge.  Cardiovascular:     Rate and Rhythm: Normal rate.     Pulses:          Radial pulses are 3+ on the right side and 3+ on the left side.     Heart sounds: Normal heart sounds.  Pulmonary:     Effort: Pulmonary effort is normal. Tachypnea present. No respiratory distress.     Breath sounds: Normal breath sounds. No stridor.   Abdominal:     General: Abdomen is protuberant. Bowel sounds are normal. There is no distension. There are no signs of injury.     Palpations: Abdomen is soft. There is no mass or pulsatile mass.     Tenderness: There is abdominal tenderness in the epigastric area. There is no right CVA tenderness, left CVA tenderness, guarding or rebound. Negative signs include McBurney's sign.     Hernia: There is no hernia in the umbilical area or ventral area.  Musculoskeletal:     Cervical back: Neck supple.     Right lower leg: No edema.     Left lower leg: No edema.  Skin:    General: Skin is warm.     Coloration: Skin is not jaundiced or pale.  Neurological:     General: No focal deficit present.     Mental Status: He is alert.  Psychiatric:        Behavior: Behavior is cooperative.     ED Results / Procedures / Treatments   Labs (all labs ordered are listed, but only abnormal results are displayed) Labs Reviewed  COMPREHENSIVE METABOLIC PANEL - Abnormal; Notable for the following components:      Result Value   Potassium 3.4 (*)    Glucose, Bld 125 (*)    All other components within normal limits  CBG MONITORING, ED - Abnormal; Notable for the following components:   Glucose-Capillary 116 (*)    All other components within normal limits  CBC WITH DIFFERENTIAL/PLATELET  LIPASE, BLOOD    EKG EKG Interpretation  Date/Time:  Wednesday December 19 2020 09:06:32 EDT Ventricular Rate:  65 PR Interval:  99 QRS Duration: 82 QT Interval:  398 QTC Calculation: 414 R Axis:   83 Text Interpretation: Sinus rhythm Short PR interval Borderline T wave abnormalities NSR, no change from previous Confirmed by Lavenia Atlas [phone]) on 12/19/2020 9:21:10 AM   Radiology DG ABD ACUTE 2+V W 1V CHEST  Result Date: 12/17/2020 CLINICAL DATA:  Abdominal pain with nausea and vomiting EXAM: DG ABDOMEN ACUTE WITH 1 VIEW CHEST COMPARISON:  CT abdomen and pelvis October 02, 2019; chest radiograph August 30, 2007 FINDINGS: PA chest: Lungs are clear. Heart size and pulmonary vascularity are normal. No adenopathy. Supine and upright abdomen: There is diffuse stool throughout the colon. There is no bowel dilatation or air-fluid level to suggest  bowel obstruction. No free air. No abnormal calcifications. IMPRESSION: Diffuse stool throughout colon. Question a degree of underlying constipation. No bowel obstruction or free air. Lungs clear. Electronically Signed   By: Lowella Grip III M.D.   On: 12/17/2020 09:39    Procedures Procedures   Medications Ordered in ED Medications  LORazepam (ATIVAN) injection 0.5 mg (0.5 mg Intravenous Given 12/19/20 0910)  fentaNYL (SUBLIMAZE) injection 50 mcg (50 mcg Intravenous Given 12/19/20 0909)    ED Course  I have reviewed the triage vital signs and the nursing notes.  Pertinent labs & imaging results that were available during my care of the patient were reviewed by me and considered in my medical decision making (see chart for details).    MDM Rules/Calculators/A&P                          Alert 30 year old male no acute distress, nontoxic appearing.  Patient appears uncomfortable due to his complaints of epigastric pain he is standing and pacing around the room and noted to be diaphoretic.  Patient reports complaint of epigastric pain as well as nausea.  Patient reports pain is experiencing today is "exactly the same," as pain he is experienced on 3/26 and 3/28.  Patient denies any radiation of his pain.  Patient endorses nausea and dry heaving however not producing any emesis.  Patient denies any constipation, diarrhea, blood in stool, or melena.  No GU symptoms.  No neck stiffness, neck pain, or back pain.  On physical exam abdomen is protuberant, NABS, soft, nondistended, tenderness to epigastric area.  No mass, pulsatile mass, umbilical hernia, ventral hernia, guarding, rebound tenderness, CVA tenderness, or tenderness over McBurney's point.  We will  give patient 50 mcg fentanyl and Ativan for his pain and nausea.  Will obtain EKG as patient has had multiple rounds of QTC prolongation medication within a short period of time at home.  Will obtain CBG, CBC, CMP, lipase.   0940 on repeat examination patient resting comfortably in hospital bed.  He reports improvement in his pain and nausea.  Abdomen soft, nondistended, nontender.  EKG shows sinus rhythm with no change from previous tracing, QTC 414.   Low suspicion for acute pancreatitis as lipase within normal limits. Low suspicion for hepatobiliary disease as AST, ALT, alk phos, total bili all within normal limits. Low suspicion for DKA as glucose 125, anion gap and bicarb within normal limits. Patient is afebrile, no leukocytosis, low suspicion for intra-abdominal infection.    Patient's symptoms likely due to gastroparesis and/or cyclic vomiting due to patient's marijuana use.  Will give p.o. challenge.  If patient is able to handle discharge and have follow-up with primary care provider as well as gastroenterology.  Per chart review patient was seen by [facility] with Dr. Rush Landmark 07/07/2019.  We will send patient back to this provider for follow-up.  10:50 patient was able to hold p.o. liquids without any difficulty.  Continues to deny any pain or nausea at this time.  Abdomen remains soft, nondistended, nontender.  Patient in no acute distress.  Hemodynamically stable.  Will discharge patient.  Discussed results, findings, treatment and follow up. Patient advised of return precautions. Patient verbalized understanding and agreed with plan.    Final Clinical Impression(s) / ED Diagnoses Final diagnoses:  Epigastric pain  Nausea    Rx / DC Orders ED Discharge Orders    None       Loni Beckwith, PA-C 12/19/20  [place], [place], [place] 12/20/20 8250

## 2020-12-19 NOTE — ED Notes (Signed)
Pt. Provided with water for fluid challenge.

## 2020-12-21 ENCOUNTER — Encounter (HOSPITAL_COMMUNITY): Payer: Self-pay

## 2020-12-21 ENCOUNTER — Other Ambulatory Visit: Payer: Self-pay | Admitting: Internal Medicine

## 2020-12-21 ENCOUNTER — Emergency Department (HOSPITAL_COMMUNITY)
Admission: EM | Admit: 2020-12-21 | Discharge: 2020-12-21 | Disposition: A | Discharge status: Home / Self Care | Payer: Medicaid Other | Attending: Emergency Medicine | Admitting: Emergency Medicine

## 2020-12-21 ENCOUNTER — Other Ambulatory Visit: Payer: Self-pay

## 2020-12-21 DIAGNOSIS — F17210 Nicotine dependence, cigarettes, uncomplicated: Secondary | ICD-10-CM | POA: Insufficient documentation

## 2020-12-21 DIAGNOSIS — R1013 Epigastric pain: Secondary | ICD-10-CM | POA: Diagnosis present

## 2020-12-21 DIAGNOSIS — E1042 Type 1 diabetes mellitus with diabetic polyneuropathy: Secondary | ICD-10-CM | POA: Diagnosis not present

## 2020-12-21 DIAGNOSIS — Z794 Long term (current) use of insulin: Secondary | ICD-10-CM | POA: Diagnosis not present

## 2020-12-21 DIAGNOSIS — R109 Unspecified abdominal pain: Secondary | ICD-10-CM

## 2020-12-21 DIAGNOSIS — K3184 Gastroparesis: Secondary | ICD-10-CM | POA: Insufficient documentation

## 2020-12-21 DIAGNOSIS — E1065 Type 1 diabetes mellitus with hyperglycemia: Secondary | ICD-10-CM

## 2020-12-21 DIAGNOSIS — G8929 Other chronic pain: Secondary | ICD-10-CM | POA: Insufficient documentation

## 2020-12-21 LAB — COMPREHENSIVE METABOLIC PANEL
ALT: 13 U/L (ref 0–44)
AST: 17 U/L (ref 15–41)
Albumin: 4.2 g/dL (ref 3.5–5.0)
Alkaline Phosphatase: 92 U/L (ref 38–126)
Anion gap: 9 (ref 5–15)
BUN: 8 mg/dL (ref 6–20)
CO2: 26 mmol/L (ref 22–32)
Calcium: 9.2 mg/dL (ref 8.9–10.3)
Chloride: 106 mmol/L (ref 98–111)
Creatinine, Ser: 1.17 mg/dL (ref 0.61–1.24)
GFR, Estimated: >60 mL/min (ref >60)
Glucose, Bld: 164 mg/dL — ABNORMAL HIGH (ref 70–99)
Potassium: 3.6 mmol/L (ref 3.5–5.1)
Sodium: 141 mmol/L (ref 135–145)
Total Bilirubin: 0.9 mg/dL (ref 0.3–1.2)
Total Protein: 7.6 g/dL (ref 6.5–8.1)

## 2020-12-21 LAB — CBC WITH DIFFERENTIAL/PLATELET
Abs Immature Granulocytes: 0.02 10*3/uL (ref 0.00–0.07)
Basophils Absolute: 0.1 10*3/uL (ref 0.0–0.1)
Basophils Relative: 1 %
Eosinophils Absolute: 0.7 10*3/uL — ABNORMAL HIGH (ref 0.0–0.5)
Eosinophils Relative: 8 %
HCT: 43 % (ref 39.0–52.0)
Hemoglobin: 14.2 g/dL (ref 13.0–17.0)
Immature Granulocytes: 0 %
Lymphocytes Relative: 47 %
Lymphs Abs: 4.1 10*3/uL — ABNORMAL HIGH (ref 0.7–4.0)
MCH: 27.4 pg (ref 26.0–34.0)
MCHC: 33 g/dL (ref 30.0–36.0)
MCV: 83 fL (ref 80.0–100.0)
Monocytes Absolute: 0.7 10*3/uL (ref 0.1–1.0)
Monocytes Relative: 8 %
Neutro Abs: 3.2 10*3/uL (ref 1.7–7.7)
Neutrophils Relative %: 36 %
Platelets: 343 10*3/uL (ref 150–400)
RBC: 5.18 MIL/uL (ref 4.22–5.81)
RDW: 14.3 % (ref 11.5–15.5)
WBC: 8.8 10*3/uL (ref 4.0–10.5)
nRBC: 0 % (ref 0.0–0.2)

## 2020-12-21 LAB — LIPASE, BLOOD: Lipase: 24 U/L (ref 11–51)

## 2020-12-21 LAB — CBG MONITORING, ED: Glucose-Capillary: 145 mg/dL — ABNORMAL HIGH (ref 70–99)

## 2020-12-21 MED ORDER — FENTANYL CITRATE (PF) 100 MCG/2ML IJ SOLN
50.0000 ug | Freq: Once | INTRAMUSCULAR | Status: AC
  Administered 2020-12-21: 50 ug via INTRAVENOUS
  Filled 2020-12-21: qty 2

## 2020-12-21 MED ORDER — SODIUM CHLORIDE 0.9 % IV BOLUS
1000.0000 mL | Freq: Once | INTRAVENOUS | Status: AC
  Administered 2020-12-21: 1000 mL via INTRAVENOUS

## 2020-12-21 MED ORDER — HALOPERIDOL LACTATE 5 MG/ML IJ SOLN
2.0000 mg | Freq: Once | INTRAMUSCULAR | Status: AC
  Administered 2020-12-21: 2 mg via INTRAVENOUS
  Filled 2020-12-21: qty 1

## 2020-12-21 MED FILL — LANTUS SOLOSTAR 100 UNITS/M: 100 | 90 days supply | Qty: 15 | Fill #0

## 2020-12-21 MED FILL — ACCU-CHEK GUIDE TEST STRIP: 25 days supply | Qty: 100 | Fill #0

## 2020-12-21 MED FILL — NOVOLOG FLEXPEN SYRINGE: 100 | 33 days supply | Qty: 15 | Fill #0

## 2020-12-21 NOTE — Discharge Instructions (Addendum)
You were seen today for ongoing abdominal pain and vomiting.  Follow-up with your primary physician.  Continue to take your medications at home as directed.

## 2020-12-21 NOTE — ED Notes (Signed)
Pt is agitated and pacing the room. He states that he knows his body and he knew the medicine would not work. He states that he wants stronger pain medicine. MD made aware.

## 2020-12-21 NOTE — ED Notes (Signed)
Pt discharged from this ED in stable condition at this time. All discharge instructions and follow up care reviewed with pt with no further questions at this time. Pt ambulatory with steady gait, clear speech.  

## 2020-12-21 NOTE — ED Notes (Signed)
Pt given water for PO challenge. Pt states he will take slow sips and let me know if he has return of pain or nausea.

## 2020-12-21 NOTE — Telephone Encounter (Signed)
Patient is aware that pharmacy already has script on file and will get them ready. Patient advise that they will close at 5 pm.

## 2020-12-21 NOTE — ED Triage Notes (Signed)
Pt reports ongoing generalized abdominal pain. Multiple visits for same. Reports recent admission but no improvement with pain.

## 2020-12-21 NOTE — ED Provider Notes (Signed)
[facility] DEPT Provider Note   CSN: [id number] Arrival date & time: 12/21/20  0028     History Chief Complaint  Patient presents with  . Abdominal Pain    Jonathan Porter is a 30 y.o. male.  HPI     This a 30 year old male with a history of diabetes and gastroparesis who presents with abdominal pain and vomiting.  Reports onset of abdominal pain that awoke him from sleep.  He reports he ate tacos for dinner.  He has had several recent ER evaluations for the same and was admitted to the hospital on 3/28.  Patient reports that he has been taking his medications at home as directed.  He reports sharp epigastric pain.  He reports associated nonbilious, nonbloody emesis.  Rates pain at 10 out of 10.  Reports that his blood sugars at home have been "pretty good recently."  Past Medical History:  Diagnosis Date  . Diabetes mellitus without complication ([place])   . Gastroparesis     Patient Active Problem List   Diagnosis Date Noted  . Marijuana abuse 12/17/2020  . Intractable nausea and vomiting 07/03/2019  . Gastroparesis 07/02/2019  . Influenza vaccine refused 06/03/2019  . Erectile dysfunction 06/03/2019  . Obesity (BMI 30-39.9) 06/03/2019  . Type 1 diabetes mellitus with diabetic polyneuropathy ([place]) 01/21/2019  . Type 1 diabetes mellitus with hyperglycemia ([place]) 01/21/2019  . Paronychia of finger of left hand 01/20/2017  . 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 Diabetes type 1, uncontrolled ([place]) 08/23/2015  . Onychomycosis of toenail 08/23/2015  . Phimosis 08/07/2014    Past Surgical History:  Procedure Laterality Date  . NO PAST SURGERIES         Family History  Problem Relation Age of Onset  . Thyroid disease Mother   . Diabetes Maternal Aunt   . Diabetes Maternal Grandmother   . Diabetes Maternal Grandfather   . Thyroid disease Other     Social History   Tobacco Use  . Smoking status: Current Every  Day Smoker    Packs/day: 0.50    Years: 6.00    Pack years: 3.00    Types: Cigarettes  . Smokeless tobacco: Never Used  Vaping Use  . Vaping Use: Never used  Substance Use Topics  . Alcohol use: Yes    Comment: social   . Drug use: Yes    Types: Marijuana    Home Medications Prior to Admission medications   Medication Sig Start Date End Date Taking? Authorizing Provider  Accu-Chek FastClix Lancets MISC Use to check blood sugar up to 3 times daily. 06/03/19   Ladell Pier, MD  blood glucose meter kit and supplies KIT Dispense based on patient and insurance preference. Use up to four times daily as directed. (FOR ICD-9 250.00, 250.01). 12/01/19   Argentina Donovan, PA-C  Blood Glucose Monitoring Suppl (ACCU-CHEK GUIDE ME) w/Device KIT 1 kit by Does not apply route 3 (three) times daily. Use to check blood sugar up to 3 times daily. 06/03/19   Ladell Pier, MD  dicyclomine (BENTYL) 20 MG tablet Take 1 tablet (20 mg total) by mouth every 8 (eight) hours as needed for spasms. 12/18/20   Pokhrel, Laxman, MD  glucose blood (ACCU-CHEK GUIDE) test strip USE AS DIRECTED UP TO FOUR TIMES DAILY 09/28/20   Ladell Pier, MD  insulin aspart (NOVOLOG FLEXPEN) 100 UNIT/ML FlexPen Inject 15 Units into the skin 3 (three) times daily with meals.  10/16/20   Ladell Pier, MD  insulin glargine (LANTUS) 100 UNIT/ML Solostar Pen Inject 15 Units into the skin daily. 10/16/20   Ladell Pier, MD  metoCLOPramide (REGLAN) 10 MG tablet Take 1 tablet (10 mg total) by mouth every 8 (eight) hours as needed for nausea. 12/18/20   Pokhrel, Corrie Mckusick, MD  ondansetron (ZOFRAN ODT) 4 MG disintegrating tablet Take 1 tablet (4 mg total) by mouth every 8 (eight) hours as needed for nausea or vomiting. Patient not taking: No sig reported 12/15/20   Caccavale, Sophia, PA-C  sildenafil (VIAGRA) 50 MG tablet TAKE ONE TABLET BY MOUTH 30 MINUTES PRIOR TO SEXUAL INTERCOURSE AS NEEDED. LIMIT 1 IN 24 HOURS Patient taking  differently: Take 50 mg by mouth as needed for erectile dysfunction (30 minutes prior to sexual intercourse as needed. limit 1 in 24hrs). 10/16/20   Ladell Pier, MD  TRUEPLUS 5-BEVEL PEN NEEDLES 31G X 8 MM MISC USE AS DIRECTED UP TO FOUR TIMES DAILY 01/05/20   Ladell Pier, MD  PROMETHAZINE HCL PO Take 1 tablet by mouth every 6 (six) hours as needed (nausea/vomitting).  02/23/20  [provider]  sucralfate (CARAFATE) 1 g tablet Take 1 tablet (1 g total) by mouth 4 (four) times daily for 14 days. Patient not taking: Reported on 02/23/2020 10/01/19 02/23/20  McDonald, Mia A, PA-C    Allergies    Shellfish allergy  Review of Systems   Review of Systems  Constitutional: Negative for fever.  Respiratory: Negative for shortness of breath.   Cardiovascular: Negative for chest pain.  Gastrointestinal: Positive for abdominal pain, nausea and vomiting. Negative for constipation and diarrhea.  Genitourinary: Negative for dysuria.  All other systems reviewed and are negative.   Physical Exam Updated Vital Signs BP 140/85 (BP Location: Left Arm)   Pulse 70   Temp 97.6 F (36.4 C) (Oral)   Resp 18   Ht 1.803 m (_0 )   Wt 97.5 kg   SpO2 100%   BMI 29.99 kg/m   Physical Exam Vitals and nursing note reviewed.  Constitutional:      Appearance: He is well-developed.     Comments: Pacing in the room, appears uncomfortable  HENT:     Head: Normocephalic and atraumatic.     Mouth/Throat:     Mouth: Mucous membranes are moist.  Eyes:     Pupils: Pupils are equal, round, and reactive to light.  Cardiovascular:     Rate and Rhythm: Normal rate and regular rhythm.     Heart sounds: Normal heart sounds. No murmur heard.   Pulmonary:     Effort: Pulmonary effort is normal. No respiratory distress.     Breath sounds: Normal breath sounds. No wheezing.  Abdominal:     General: Bowel sounds are normal.     Palpations: Abdomen is soft.     Tenderness: There is abdominal  tenderness in the epigastric area. There is no guarding or rebound.  Musculoskeletal:     Cervical back: Neck supple.  Lymphadenopathy:     Cervical: No cervical adenopathy.  Skin:    General: Skin is warm and dry.  Neurological:     Mental Status: He is alert and oriented to person, place, and time.  Psychiatric:     Comments: Anxious appearing     ED Results / Procedures / Treatments   Labs (all labs ordered are listed, but only abnormal results are displayed) Labs Reviewed  CBC WITH DIFFERENTIAL/PLATELET - Abnormal; Notable for  the following components:      Result Value   Lymphs Abs 4.1 (*)    Eosinophils Absolute 0.7 (*)    All other components within normal limits  COMPREHENSIVE METABOLIC PANEL - Abnormal; Notable for the following components:   Glucose, Bld 164 (*)    All other components within normal limits  CBG MONITORING, ED - Abnormal; Notable for the following components:   Glucose-Capillary 145 (*)    All other components within normal limits  LIPASE, BLOOD    EKG None  Radiology No results found.  Procedures Procedures   Medications Ordered in ED Medications  sodium chloride 0.9 % bolus 1,000 mL (0 mLs Intravenous Stopped 12/21/20 0208)  haloperidol lactate (HALDOL) injection 2 mg (2 mg Intravenous Given 12/21/20 0116)  fentaNYL (SUBLIMAZE) injection 50 mcg (50 mcg Intravenous Given 12/21/20 0212)    ED Course  I have reviewed the triage vital signs and the nursing notes.  Pertinent labs & imaging results that were available during my care of the patient were reviewed by me and considered in my medical decision making (see chart for details).    MDM Rules/Calculators/A&P                          Patient presents with abdominal pain and vomiting.  History of the same with chronic abdominal pain and gastroparesis.  He is uncomfortable appearing but nontoxic on exam and vital signs are reassuring.  Mild tenderness without signs of peritonitis.  He has  been seen and evaluated multiple times over the last week including an admission to the hospital.  Reports compliance with medications at home.  Patient was given fluids and Haldol initially.  He endorsed ongoing pain and was subsequently given 1 dose of fentanyl with complete resolution of symptoms.  Labs obtained and reviewed and are reassuring.  No significant metabolic derangement.  No evidence of DKA.  No leukocytosis.  A given relative benign abdominal exam, doubt intra-abdominal pathology such as cholecystitis, appendicitis, pancreatitis.  Patient was able to tolerate fluids at discharge.  Suspect his symptoms are related to chronic abdominal pain.  After history, exam, and medical workup I feel the patient has been appropriately medically screened and is safe for discharge home. Pertinent diagnoses were discussed with the patient. Patient was given return precautions.  Final Clinical Impression(s) / ED Diagnoses Final diagnoses:  Chronic abdominal pain  Gastroparesis    Rx / DC Orders ED Discharge Orders    None       Reyana Leisey, Barbette Hair, MD 12/21/20 [phone]

## 2020-12-21 NOTE — ED Notes (Signed)
Assumed care of pt at this time. Pt resting in stretcher, no s/sx of acute distress at this time.  

## 2020-12-21 NOTE — ED Notes (Signed)
Pt vomited in the sink. Reita Cliche, RN, asked pt to try to use an emesis bag or the trash can. Pt became agitated and told Reita Cliche that he would clean the sink and that Reita Cliche should do his job.

## 2020-12-21 NOTE — Telephone Encounter (Signed)
Medication: insulin aspart (NOVOLOG FLEXPEN) 100 UNIT/ML FlexPen [[id number]] , insulin glargine (LANTUS) 100 UNIT/ML Solostar Pen [[id number]], glucose blood (ACCU-CHEK GUIDE) test strip [[id number]]    Has the patient contacted their pharmacy? YES (Agent: If no, request that the patient contact the pharmacy for the refill.) (Agent: If yes, when and what did the pharmacy advise?)  Preferred Pharmacy (with phone number or street name): [facility] - [place], [place] [address] [place] [address] Phone: [phone] Fax: [phone] Hours: Not open 24 hours    Agent: Please be advised that RX refills may take up to 3 business days. We ask that you follow-up with your pharmacy.

## 2020-12-21 NOTE — Telephone Encounter (Signed)
Refills available in our pharmacy. I have placed these for him.

## 2020-12-22 ENCOUNTER — Other Ambulatory Visit: Payer: Self-pay

## 2020-12-23 ENCOUNTER — Emergency Department (HOSPITAL_COMMUNITY)
Admission: EM | Admit: 2020-12-23 | Discharge: 2020-12-23 | Disposition: A | Discharge status: Home / Self Care | Payer: Medicaid Other | Source: Home / Self Care | Attending: Emergency Medicine | Admitting: Emergency Medicine

## 2020-12-23 ENCOUNTER — Encounter (HOSPITAL_COMMUNITY): Payer: Self-pay | Admitting: Emergency Medicine

## 2020-12-23 ENCOUNTER — Emergency Department (HOSPITAL_COMMUNITY)
Admission: EM | Admit: 2020-12-23 | Discharge: 2020-12-23 | Disposition: A | Discharge status: Home / Self Care | Payer: Medicaid Other | Source: Home / Self Care

## 2020-12-23 ENCOUNTER — Emergency Department (HOSPITAL_COMMUNITY)
Admission: EM | Admit: 2020-12-23 | Discharge: 2020-12-23 | Disposition: A | Discharge status: Home / Self Care | Payer: Medicaid Other | Attending: Emergency Medicine | Admitting: Emergency Medicine

## 2020-12-23 ENCOUNTER — Other Ambulatory Visit: Payer: Self-pay

## 2020-12-23 DIAGNOSIS — K3184 Gastroparesis: Secondary | ICD-10-CM | POA: Insufficient documentation

## 2020-12-23 DIAGNOSIS — R1084 Generalized abdominal pain: Secondary | ICD-10-CM | POA: Insufficient documentation

## 2020-12-23 DIAGNOSIS — Z5321 Procedure and treatment not carried out due to patient leaving prior to being seen by health care provider: Secondary | ICD-10-CM | POA: Insufficient documentation

## 2020-12-23 DIAGNOSIS — E1042 Type 1 diabetes mellitus with diabetic polyneuropathy: Secondary | ICD-10-CM | POA: Insufficient documentation

## 2020-12-23 DIAGNOSIS — E1043 Type 1 diabetes mellitus with diabetic autonomic (poly)neuropathy: Secondary | ICD-10-CM | POA: Diagnosis not present

## 2020-12-23 DIAGNOSIS — F17210 Nicotine dependence, cigarettes, uncomplicated: Secondary | ICD-10-CM | POA: Insufficient documentation

## 2020-12-23 DIAGNOSIS — R197 Diarrhea, unspecified: Secondary | ICD-10-CM | POA: Insufficient documentation

## 2020-12-23 DIAGNOSIS — Z794 Long term (current) use of insulin: Secondary | ICD-10-CM | POA: Diagnosis not present

## 2020-12-23 DIAGNOSIS — R112 Nausea with vomiting, unspecified: Secondary | ICD-10-CM | POA: Insufficient documentation

## 2020-12-23 DIAGNOSIS — R109 Unspecified abdominal pain: Secondary | ICD-10-CM

## 2020-12-23 LAB — CBC WITH DIFFERENTIAL/PLATELET
Abs Immature Granulocytes: 0.02 10*3/uL (ref 0.00–0.07)
Abs Immature Granulocytes: 0.01 10*3/uL (ref 0.00–0.07)
Basophils Absolute: 0.1 10*3/uL (ref 0.0–0.1)
Basophils Absolute: 0.1 10*3/uL (ref 0.0–0.1)
Basophils Relative: 1 %
Basophils Relative: 1 %
Eosinophils Absolute: 0.6 10*3/uL — ABNORMAL HIGH (ref 0.0–0.5)
Eosinophils Absolute: 0.2 10*3/uL (ref 0.0–0.5)
Eosinophils Relative: 7 %
Eosinophils Relative: 2 %
HCT: 45.4 % (ref 39.0–52.0)
HCT: 43.8 % (ref 39.0–52.0)
Hemoglobin: 14.9 g/dL (ref 13.0–17.0)
Hemoglobin: 14.5 g/dL (ref 13.0–17.0)
Immature Granulocytes: 0 %
Immature Granulocytes: 0 %
Lymphocytes Relative: 30 %
Lymphocytes Relative: 35 %
Lymphs Abs: 2.8 10*3/uL (ref 0.7–4.0)
Lymphs Abs: 3.1 10*3/uL (ref 0.7–4.0)
MCH: 27.5 pg (ref 26.0–34.0)
MCH: 27.7 pg (ref 26.0–34.0)
MCHC: 32.8 g/dL (ref 30.0–36.0)
MCHC: 33.1 g/dL (ref 30.0–36.0)
MCV: 83.8 fL (ref 80.0–100.0)
MCV: 83.6 fL (ref 80.0–100.0)
Monocytes Absolute: 0.6 10*3/uL (ref 0.1–1.0)
Monocytes Absolute: 0.5 10*3/uL (ref 0.1–1.0)
Monocytes Relative: 7 %
Monocytes Relative: 5 %
Neutro Abs: 5.3 10*3/uL (ref 1.7–7.7)
Neutro Abs: 5 10*3/uL (ref 1.7–7.7)
Neutrophils Relative %: 55 %
Neutrophils Relative %: 57 %
Platelets: 354 10*3/uL (ref 150–400)
Platelets: 336 10*3/uL (ref 150–400)
RBC: 5.42 MIL/uL (ref 4.22–5.81)
RBC: 5.24 MIL/uL (ref 4.22–5.81)
RDW: 14.2 % (ref 11.5–15.5)
RDW: 14.1 % (ref 11.5–15.5)
WBC: 9.4 10*3/uL (ref 4.0–10.5)
WBC: 8.9 10*3/uL (ref 4.0–10.5)
nRBC: 0 % (ref 0.0–0.2)
nRBC: 0 % (ref 0.0–0.2)

## 2020-12-23 LAB — COMPREHENSIVE METABOLIC PANEL
ALT: 17 U/L (ref 0–44)
ALT: 15 U/L (ref 0–44)
AST: 19 U/L (ref 15–41)
AST: 19 U/L (ref 15–41)
Albumin: 4.4 g/dL (ref 3.5–5.0)
Albumin: 4 g/dL (ref 3.5–5.0)
Alkaline Phosphatase: 97 U/L (ref 38–126)
Alkaline Phosphatase: 93 U/L (ref 38–126)
Anion gap: 10 (ref 5–15)
Anion gap: 8 (ref 5–15)
BUN: 7 mg/dL (ref 6–20)
BUN: 5 mg/dL — ABNORMAL LOW (ref 6–20)
CO2: 25 mmol/L (ref 22–32)
CO2: 26 mmol/L (ref 22–32)
Calcium: 9.6 mg/dL (ref 8.9–10.3)
Calcium: 9.4 mg/dL (ref 8.9–10.3)
Chloride: 103 mmol/L (ref 98–111)
Chloride: 103 mmol/L (ref 98–111)
Creatinine, Ser: 1.21 mg/dL (ref 0.61–1.24)
Creatinine, Ser: 1.14 mg/dL (ref 0.61–1.24)
GFR, Estimated: >60 mL/min (ref >60)
GFR, Estimated: >60 mL/min (ref >60)
Glucose, Bld: 201 mg/dL — ABNORMAL HIGH (ref 70–99)
Glucose, Bld: 277 mg/dL — ABNORMAL HIGH (ref 70–99)
Potassium: 3.9 mmol/L (ref 3.5–5.1)
Potassium: 3.9 mmol/L (ref 3.5–5.1)
Sodium: 138 mmol/L (ref 135–145)
Sodium: 137 mmol/L (ref 135–145)
Total Bilirubin: 0.3 mg/dL (ref 0.3–1.2)
Total Bilirubin: 0.6 mg/dL (ref 0.3–1.2)
Total Protein: 7.9 g/dL (ref 6.5–8.1)
Total Protein: 7.3 g/dL (ref 6.5–8.1)

## 2020-12-23 LAB — URINALYSIS, ROUTINE W REFLEX MICROSCOPIC
Bilirubin Urine: NEGATIVE
Glucose, UA: 50 mg/dL — AB
Hgb urine dipstick: NEGATIVE
Ketones, ur: NEGATIVE mg/dL
Leukocytes,Ua: NEGATIVE
Nitrite: NEGATIVE
Protein, ur: NEGATIVE mg/dL
Specific Gravity, Urine: 1.013 (ref 1.005–1.030)
pH: 8 (ref 5.0–8.0)

## 2020-12-23 LAB — BLOOD GAS, VENOUS
Acid-Base Excess: 6.1 mmol/L — ABNORMAL HIGH (ref 0.0–2.0)
Bicarbonate: 29.8 mmol/L — ABNORMAL HIGH (ref 20.0–28.0)
O2 Saturation: 29.6 %
Patient temperature: 98.6
pCO2, Ven: 40.8 mmHg — ABNORMAL LOW (ref 44.0–60.0)
pH, Ven: 7.476 — ABNORMAL HIGH (ref 7.250–7.430)
pO2, Ven: 17.4 mmHg — CL (ref 32.0–45.0)

## 2020-12-23 LAB — LIPASE, BLOOD
Lipase: 24 U/L (ref 11–51)
Lipase: 23 U/L (ref 11–51)

## 2020-12-23 LAB — CBG MONITORING, ED: Glucose-Capillary: 267 mg/dL — ABNORMAL HIGH (ref 70–99)

## 2020-12-23 MED ORDER — FENTANYL CITRATE (PF) 100 MCG/2ML IJ SOLN
50.0000 ug | Freq: Once | INTRAMUSCULAR | Status: AC
Start: 2020-12-23 — End: 2020-12-23
  Administered 2020-12-23: 50 ug via INTRAVENOUS
  Filled 2020-12-23: qty 2

## 2020-12-23 MED ORDER — SODIUM CHLORIDE 0.9 % IV BOLUS
1000.0000 mL | Freq: Once | INTRAVENOUS | Status: AC
  Administered 2020-12-23: 1000 mL via INTRAVENOUS

## 2020-12-23 MED ORDER — HALOPERIDOL LACTATE 5 MG/ML IJ SOLN
2.0000 mg | Freq: Once | INTRAMUSCULAR | Status: AC
  Administered 2020-12-23: 2 mg via INTRAVENOUS
  Filled 2020-12-23: qty 1

## 2020-12-23 MED ORDER — KETOROLAC TROMETHAMINE 60 MG/2ML IM SOLN
30.0000 mg | Freq: Once | INTRAMUSCULAR | Status: AC
  Administered 2020-12-23: 30 mg via INTRAMUSCULAR
  Filled 2020-12-23: qty 2

## 2020-12-23 MED ORDER — ONDANSETRON HCL 4 MG PO TABS: 4.0000 mg | ORAL_TABLET | Freq: Four times a day (QID) | ORAL | 0 refills | Status: DC

## 2020-12-23 MED ORDER — METOCLOPRAMIDE HCL 10 MG PO TABS
10.0000 mg | ORAL_TABLET | Freq: Once | ORAL | Status: AC
  Administered 2020-12-23: 10 mg via ORAL
  Filled 2020-12-23: qty 1

## 2020-12-23 NOTE — ED Provider Notes (Signed)
Patient presents with c/o abdominal pain.  Seen in the emergency department earlier today and treated.  He then presented to [facility], eloped, and return to [employer].  He states his pain was better for "10 minutes" and currently complains of severe abdominal pain.  He is begging to be admitted to the hospital.  Exam:  Gen agitated, pacing in room, holding abdomen.  BP (!) 158/106   Pulse 99   Temp 98.2 F (36.8 C) (Oral)   Resp (!) 22   SpO2 100%   MSE was initiated and I personally evaluated the patient and placed orders (if any) at 5:29 PM  on December 23, 2020.  The patient appears stable so that the remainder of the MSE may be completed by another provider.    Renne Crigler, PA-C 12/23/20 1730    Milagros Loll, MD 12/24/20 [phone]

## 2020-12-23 NOTE — ED Notes (Signed)
Pt remains on his phone sitting up in the room. No visible distress.

## 2020-12-23 NOTE — ED Triage Notes (Signed)
Pt to triage via [facility] from home.  States he was discharged from [facility] 1 hour ago.  Reports generalized abd pain, nausea, vomiting, and diarrhea.  States he took Zofran after getting home and was having pain.  Pain has resolved on arrival to triage.

## 2020-12-23 NOTE — ED Notes (Signed)
Patient speaking on the phone with his friend about tinting his car windows. No distress noted.

## 2020-12-23 NOTE — ED Notes (Signed)
Pt verbally aggressive at the doctor. Security standing by.

## 2020-12-23 NOTE — ED Provider Notes (Addendum)
[facility] DEPT Provider Note   CSN: [id number] Arrival date & time: 12/23/20  1033     History Chief Complaint  Patient presents with  . Abdominal Cramping    Jonathan Porter is a 30 y.o. male.  HPI   30 year old male with a history of diabetes, gastroparesis, marijuana abuse, who presents to the emergency department today for evaluation of abdominal pain nausea and vomiting.  States symptoms have been ongoing for the whole week.  Jonathan Porter denies any diarrhea, constipation, urinary complaints.  Denies any fevers.  Has not been able to keep down his medications.  Has been seen multiple times recently for similar symptoms.  Past Medical History:  Diagnosis Date  . Diabetes mellitus without complication ([place])   . Gastroparesis     Patient Active Problem List   Diagnosis Date Noted  . Marijuana abuse 12/17/2020  . Intractable nausea and vomiting 07/03/2019  . Gastroparesis 07/02/2019  . Influenza vaccine refused 06/03/2019  . Erectile dysfunction 06/03/2019  . Obesity (BMI 30-39.9) 06/03/2019  . Type 1 diabetes mellitus with diabetic polyneuropathy ([place]) 01/21/2019  . Type 1 diabetes mellitus with hyperglycemia ([place]) 01/21/2019  . Paronychia of finger of left hand 01/20/2017  . 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 Diabetes type 1, uncontrolled ([place]) 08/23/2015  . Onychomycosis of toenail 08/23/2015  . Phimosis 08/07/2014    Past Surgical History:  Procedure Laterality Date  . NO PAST SURGERIES         Family History  Problem Relation Age of Onset  . Thyroid disease Mother   . Diabetes Maternal Aunt   . Diabetes Maternal Grandmother   . Diabetes Maternal Grandfather   . Thyroid disease Other     Social History   Tobacco Use  . Smoking status: Current Every Day Smoker    Packs/day: 0.50    Years: 6.00    Pack years: 3.00    Types: Cigarettes  . Smokeless tobacco: Never Used  Vaping Use  . Vaping Use:  Never used  Substance Use Topics  . Alcohol use: Yes    Comment: social   . Drug use: Yes    Types: Marijuana    Home Medications Prior to Admission medications   Medication Sig Start Date End Date Taking? Authorizing Provider  ondansetron (ZOFRAN) 4 MG tablet Take 1 tablet (4 mg total) by mouth every 6 (six) hours. 12/23/20  Yes Kechia Yahnke S, PA-C  Accu-Chek FastClix Lancets MISC Use to check blood sugar up to 3 times daily. 06/03/19   Ladell Pier, MD  blood glucose meter kit and supplies KIT Dispense based on patient and insurance preference. Use up to four times daily as directed. (FOR ICD-9 250.00, 250.01). 12/01/19   Argentina Donovan, PA-C  Blood Glucose Monitoring Suppl (ACCU-CHEK GUIDE ME) w/Device KIT 1 kit by Does not apply route 3 (three) times daily. Use to check blood sugar up to 3 times daily. 06/03/19   Ladell Pier, MD  dicyclomine (BENTYL) 20 MG tablet Take 1 tablet (20 mg total) by mouth every 8 (eight) hours as needed for spasms. 12/18/20   Pokhrel, Laxman, MD  glucose blood test strip USE AS DIRECTED UP TO FOUR TIMES DAILY 09/28/20 09/28/21  Ladell Pier, MD  glucose blood test strip USE AS DIRECTED UP TO FOUR TIMES DAILY 09/28/20 09/28/21  Ladell Pier, MD  insulin aspart (NOVOLOG) 100 UNIT/ML FlexPen INJECT 15 UNITS INTO THE SKIN 3 (THREE) TIMES  DAILY WITH MEALS. 10/16/20 10/16/21  Ladell Pier, MD  insulin aspart (NOVOLOG) 100 UNIT/ML FlexPen INJECT 15 UNITS INTO THE SKIN 3 (THREE) TIMES DAILY WITH MEALS. 09/28/20 09/28/21  Ladell Pier, MD  insulin glargine (LANTUS) 100 UNIT/ML Solostar Pen INJECT 15 UNITS INTO THE SKIN DAILY. 10/16/20 10/16/21  Ladell Pier, MD  metoCLOPramide (REGLAN) 10 MG tablet Take 1 tablet (10 mg total) by mouth every 8 (eight) hours as needed for nausea. 12/18/20   Pokhrel, Corrie Mckusick, MD  ondansetron (ZOFRAN ODT) 4 MG disintegrating tablet Take 1 tablet (4 mg total) by mouth every 8 (eight) hours as needed for nausea or  vomiting. Patient not taking: No sig reported 12/15/20   Caccavale, Sophia, PA-C  sildenafil (VIAGRA) 50 MG tablet TAKE ONE TABLET BY MOUTH 30 MINUTES PRIOR TO SEXUAL INTERCOURSE AS NEEDED. LIMIT 1 IN 24 HOURS Patient taking differently: Take 50 mg by mouth as needed for erectile dysfunction (30 minutes prior to sexual intercourse as needed. limit 1 in 24hrs). 10/16/20   Ladell Pier, MD  TRUEPLUS 5-BEVEL PEN NEEDLES 31G X 8 MM MISC USE AS DIRECTED UP TO FOUR TIMES DAILY 01/05/20   Ladell Pier, MD  PROMETHAZINE HCL PO Take 1 tablet by mouth every 6 (six) hours as needed (nausea/vomitting).  02/23/20  [provider]  sucralfate (CARAFATE) 1 g tablet Take 1 tablet (1 g total) by mouth 4 (four) times daily for 14 days. Patient not taking: Reported on 02/23/2020 10/01/19 02/23/20  McDonald, Mia A, PA-C    Allergies    Shellfish allergy  Review of Systems   Review of Systems  Constitutional: Negative for chills and fever.  HENT: Negative for ear pain and sore throat.   Eyes: Negative for visual disturbance.  Respiratory: Negative for cough and shortness of breath.   Cardiovascular: Negative for chest pain.  Gastrointestinal: Positive for abdominal pain, nausea and vomiting. Negative for constipation and diarrhea.  Genitourinary: Negative for dysuria and hematuria.  Musculoskeletal: Negative for back pain.  Skin: Negative for rash.  Neurological: Negative for headaches.  All other systems reviewed and are negative.   Physical Exam Updated Vital Signs BP (!) 153/71   Pulse 65   Temp 98 F (36.7 C) (Oral)   Resp 17   SpO2 100%   Physical Exam Vitals and nursing note reviewed.  Constitutional:      Appearance: Jonathan Porter is well-developed.  HENT:     Head: Normocephalic and atraumatic.  Eyes:     Conjunctiva/sclera: Conjunctivae normal.  Cardiovascular:     Rate and Rhythm: Normal rate and regular rhythm.     Heart sounds: Normal heart sounds. No murmur  heard.   Pulmonary:     Effort: Pulmonary effort is normal. No respiratory distress.     Breath sounds: Normal breath sounds. No wheezing, rhonchi or rales.  Abdominal:     Palpations: Abdomen is soft.     Tenderness: There is abdominal tenderness (epigastric ttp). There is no guarding or rebound.  Musculoskeletal:     Cervical back: Neck supple.  Skin:    General: Skin is warm and dry.  Neurological:     Mental Status: Jonathan Porter is alert.     ED Results / Procedures / Treatments   Labs (all labs ordered are listed, but only abnormal results are displayed) Labs Reviewed  CBC WITH DIFFERENTIAL/PLATELET - Abnormal; Notable for the following components:      Result Value   Eosinophils Absolute 0.6 (*)  All other components within normal limits  COMPREHENSIVE METABOLIC PANEL - Abnormal; Notable for the following components:   Glucose, Bld 201 (*)    All other components within normal limits  URINALYSIS, ROUTINE W REFLEX MICROSCOPIC - Abnormal; Notable for the following components:   Glucose, UA 50 (*)    All other components within normal limits  BLOOD GAS, VENOUS - Abnormal; Notable for the following components:   pH, Ven 7.476 (*)    pCO2, Ven 40.8 (*)    pO2, Ven 17.4 (*)    Bicarbonate 29.8 (*)    Acid-Base Excess 6.1 (*)    All other components within normal limits  LIPASE, BLOOD  I-STAT VENOUS BLOOD GAS, ED    EKG EKG Interpretation  Date/Time:  Sunday December 23 2020 11:50:03 EDT Ventricular Rate:  62 PR Interval:    QRS Duration: 100 QT Interval:  442 QTC Calculation: 449 R Axis:   73 Text Interpretation: Confirmed by Trifan, Matthew ([id number]) on 12/23/2020 1:07:46 PM   Radiology No results found.  Procedures Procedures   Medications Ordered in ED Medications  sodium chloride 0.9 % bolus 1,000 mL (1,000 mLs Intravenous New Bag/Given 12/23/20 1136)  haloperidol lactate (HALDOL) injection 2 mg (2 mg Intravenous Given 12/23/20 1137)  fentaNYL (SUBLIMAZE) injection  50 mcg (50 mcg Intravenous Given 12/23/20 1137)    ED Course  I have reviewed the triage vital signs and the nursing notes.  Pertinent labs & imaging results that were available during my care of the patient were reviewed by me and considered in my medical decision making (see chart for details).    MDM Rules/Calculators/A&P                          29 year old male with history of diabetes, gastroparesis and frequent ED visits for persistent vomiting presenting for evaluation of abdominal pain nausea and vomiting starting earlier this week.  On exam patient has minimal epigastric tenderness but no peritoneal signs.  Jonathan Porter is actively vomiting.  Will get labs, UA and give medications.  CBC is unremarkable, CMP shows elevated blood glucose with normal bicarb and no elevated anion gap.  Lipase is negative.  UA shows glucosuria but no ketonuria.  There is no evidence of acidosis on his VBG.  Patient without signs of DKA.  Jonathan Porter was given Haldol, fentanyl and IV fluids in the ED and on reassessment Jonathan Porter states Jonathan Porter feels improved.  Jonathan Porter has been able to tolerate p.o.    Went to reassess pt and Jonathan Porter was no longer in the room. Jonathan Porter eloped prior to receiving his discharge paperwork.   Final Clinical Impression(s) / ED Diagnoses Final diagnoses:  Abdominal pain, unspecified abdominal location  Non-intractable vomiting with nausea, unspecified vomiting type    Rx / DC Orders ED Discharge Orders         Ordered    ondansetron (ZOFRAN) 4 MG tablet  Every 6 hours        04 /03/22 1431           Tequila Rottmann S, PA-C 12/23/20 1432    Telsa Dillavou S, PA-C 12/23/20 1437    Wyvonnia Dusky, MD 01/01/21 1826

## 2020-12-23 NOTE — Discharge Instructions (Addendum)
Follow-up with your primary doctor.  Return to ER for uncontrolled pain, vomiting.

## 2020-12-23 NOTE — ED Triage Notes (Signed)
Complains of abdominal cramping since this morning, endorses nausea but no emesis. Has taken all home meds w/o relief. States he dopes not have any pain meds at home, just nausea meds.

## 2020-12-23 NOTE — Discharge Instructions (Signed)
You were given a prescription for zofran to help with your nausea. Please take as directed.  Please follow up with your primary doctor within the next 5-7 days.  If you do not have a primary care provider, information for a healthcare clinic has been provided for you to make arrangements for follow up care. Please return to the ER sooner if you have any new or worsening symptoms, or if you have any of the following symptoms:  Abdominal pain that does not go away.  You have a fever.  You keep throwing up (vomiting).  The pain is felt only in portions of the abdomen. Pain in the right side could possibly be appendicitis. In an adult, pain in the left lower portion of the abdomen could be colitis or diverticulitis.  You pass bloody or black tarry stools.  There is bright red blood in the stool.  The constipation stays for more than 4 days.  There is belly (abdominal) or rectal pain.  You do not seem to be getting better.  You have any questions or concerns.   

## 2020-12-23 NOTE — ED Notes (Signed)
As per other staff members, pt seen self d/c'ing IV and walking out of ED in stable condition. Notified charge RN susan and public safety, who saw patient walk himself into passenger seat of a car with steady gait.

## 2020-12-23 NOTE — ED Triage Notes (Signed)
Patient c/o continued abdominal pain despite two ED visits today.

## 2020-12-23 NOTE — ED Provider Notes (Signed)
[facility] DEPT Provider Note   CSN: [id number] Arrival date & time: 12/23/20  1708     History Chief Complaint  Patient presents with  . Abdominal Pain  . Emesis    Jonathan Porter is a 30 y.o. male.  Presents to ER with concern for abdominal pain.  Patient reports that he has suffered for a very long time with severe abdominal pain related to his diabetes, gastroparesis.  Has been seen in the ER many times over the last week.  Admitted on 3/28 for chronic abdominal pain.  No change in symptoms today.  More of his chronic pain.  Basic labs which were completed x2 earlier today were all normal.  HPI     Past Medical History:  Diagnosis Date  . Diabetes mellitus without complication ([place])   . Gastroparesis     Patient Active Problem List   Diagnosis Date Noted  . Marijuana abuse 12/17/2020  . Intractable nausea and vomiting 07/03/2019  . Gastroparesis 07/02/2019  . Influenza vaccine refused 06/03/2019  . Erectile dysfunction 06/03/2019  . Obesity (BMI 30-39.9) 06/03/2019  . Type 1 diabetes mellitus with diabetic polyneuropathy ([place]) 01/21/2019  . Type 1 diabetes mellitus with hyperglycemia ([place]) 01/21/2019  . Paronychia of finger of left hand 01/20/2017  . 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 Diabetes type 1, uncontrolled ([place]) 08/23/2015  . Onychomycosis of toenail 08/23/2015  . Phimosis 08/07/2014    Past Surgical History:  Procedure Laterality Date  . NO PAST SURGERIES         Family History  Problem Relation Age of Onset  . Thyroid disease Mother   . Diabetes Maternal Aunt   . Diabetes Maternal Grandmother   . Diabetes Maternal Grandfather   . Thyroid disease Other     Social History   Tobacco Use  . Smoking status: Current Every Day Smoker    Packs/day: 0.50    Years: 6.00    Pack years: 3.00    Types: Cigarettes  . Smokeless tobacco: Never Used  Vaping Use  . Vaping Use: Never used   Substance Use Topics  . Alcohol use: Yes    Comment: social   . Drug use: Yes    Types: Marijuana    Home Medications Prior to Admission medications   Medication Sig Start Date End Date Taking? Authorizing Provider  Accu-Chek FastClix Lancets MISC Use to check blood sugar up to 3 times daily. 06/03/19   Ladell Pier, MD  blood glucose meter kit and supplies KIT Dispense based on patient and insurance preference. Use up to four times daily as directed. (FOR ICD-9 250.00, 250.01). 12/01/19   Argentina Donovan, PA-C  Blood Glucose Monitoring Suppl (ACCU-CHEK GUIDE ME) w/Device KIT 1 kit by Does not apply route 3 (three) times daily. Use to check blood sugar up to 3 times daily. 06/03/19   Ladell Pier, MD  dicyclomine (BENTYL) 20 MG tablet Take 1 tablet (20 mg total) by mouth every 8 (eight) hours as needed for spasms. 12/18/20   Pokhrel, Laxman, MD  glucose blood test strip USE AS DIRECTED UP TO FOUR TIMES DAILY 09/28/20 09/28/21  Ladell Pier, MD  glucose blood test strip USE AS DIRECTED UP TO FOUR TIMES DAILY 09/28/20 09/28/21  Ladell Pier, MD  insulin aspart (NOVOLOG) 100 UNIT/ML FlexPen INJECT 15 UNITS INTO THE SKIN 3 (THREE) TIMES DAILY WITH MEALS. 10/16/20 10/16/21  Ladell Pier, MD  insulin aspart (  NOVOLOG) 100 UNIT/ML FlexPen INJECT 15 UNITS INTO THE SKIN 3 (THREE) TIMES DAILY WITH MEALS. 09/28/20 09/28/21  Ladell Pier, MD  insulin glargine (LANTUS) 100 UNIT/ML Solostar Pen INJECT 15 UNITS INTO THE SKIN DAILY. 10/16/20 10/16/21  Ladell Pier, MD  metoCLOPramide (REGLAN) 10 MG tablet Take 1 tablet (10 mg total) by mouth every 8 (eight) hours as needed for nausea. 12/18/20   Pokhrel, Corrie Mckusick, MD  ondansetron (ZOFRAN ODT) 4 MG disintegrating tablet Take 1 tablet (4 mg total) by mouth every 8 (eight) hours as needed for nausea or vomiting. Patient not taking: No sig reported 12/15/20   Caccavale, Sophia, PA-C  ondansetron (ZOFRAN) 4 MG tablet Take 1 tablet (4 mg total)  by mouth every 6 (six) hours. 12/23/20   Couture, Cortni S, PA-C  sildenafil (VIAGRA) 50 MG tablet TAKE ONE TABLET BY MOUTH 30 MINUTES PRIOR TO SEXUAL INTERCOURSE AS NEEDED. LIMIT 1 IN 24 HOURS Patient taking differently: Take 50 mg by mouth as needed for erectile dysfunction (30 minutes prior to sexual intercourse as needed. limit 1 in 24hrs). 10/16/20   Ladell Pier, MD  TRUEPLUS 5-BEVEL PEN NEEDLES 31G X 8 MM MISC USE AS DIRECTED UP TO FOUR TIMES DAILY 01/05/20   Ladell Pier, MD  PROMETHAZINE HCL PO Take 1 tablet by mouth every 6 (six) hours as needed (nausea/vomitting).  02/23/20  [provider]  sucralfate (CARAFATE) 1 g tablet Take 1 tablet (1 g total) by mouth 4 (four) times daily for 14 days. Patient not taking: Reported on 02/23/2020 10/01/19 02/23/20  McDonald, Mia A, PA-C    Allergies    Shellfish allergy  Review of Systems   Review of Systems  Constitutional: Negative for chills and fever.  HENT: Negative for ear pain and sore throat.   Eyes: Negative for pain and visual disturbance.  Respiratory: Negative for cough and shortness of breath.   Cardiovascular: Negative for chest pain and palpitations.  Gastrointestinal: Positive for abdominal pain. Negative for vomiting.  Genitourinary: Negative for dysuria and hematuria.  Musculoskeletal: Negative for arthralgias and back pain.  Skin: Negative for color change and rash.  Neurological: Negative for seizures and syncope.  All other systems reviewed and are negative.   Physical Exam Updated Vital Signs BP (!) 178/100   Pulse 96   Temp 98.2 F (36.8 C) (Oral)   Resp 17   SpO2 98%   Physical Exam Vitals and nursing note reviewed.  Constitutional:      Appearance: He is well-developed.  HENT:     Head: Normocephalic and atraumatic.  Eyes:     Conjunctiva/sclera: Conjunctivae normal.  Cardiovascular:     Rate and Rhythm: Normal rate and regular rhythm.     Heart sounds: No murmur heard.   Pulmonary:      Effort: Pulmonary effort is normal. No respiratory distress.     Breath sounds: Normal breath sounds.  Abdominal:     Palpations: Abdomen is soft.     Tenderness: There is no abdominal tenderness.  Musculoskeletal:     Cervical back: Neck supple.  Skin:    General: Skin is warm and dry.  Neurological:     General: No focal deficit present.     Mental Status: He is alert.     ED Results / Procedures / Treatments   Labs (all labs ordered are listed, but only abnormal results are displayed) Labs Reviewed - No data to display  EKG None  Radiology No results found.  Procedures Procedures   Medications Ordered in ED Medications  ketorolac (TORADOL) injection 30 mg (30 mg Intramuscular Given 12/23/20 2140)  metoCLOPramide (REGLAN) tablet 10 mg (10 mg Oral Given 12/23/20 2140)    ED Course  I have reviewed the triage vital signs and the nursing notes.  Pertinent labs & imaging results that were available during my care of the patient were reviewed by me and considered in my medical decision making (see chart for details).    MDM Rules/Calculators/A&P                         30 year old male presents to ER with concern for acute on chronic abdominal pain.  On exam he is well-appearing, his abdomen is soft and nontender.  Basic labs which were obtained a few hours ago were grossly within normal limits.  Given reassuring labs and soft abdomen, doubt new acute process.  Symptoms well controlled at present.  Discharge home and follow-up with primary doctor.   After the discussed management above, the patient was determined to be safe for discharge.  The patient was in agreement with this plan and all questions regarding their care were answered.  ED return precautions were discussed and the patient will return to the ED with any significant worsening of condition.   Final Clinical Impression(s) / ED Diagnoses Final diagnoses:  Gastroparesis    Rx / DC Orders ED Discharge Orders     None       Lucrezia Starch, MD 12/23/20 2238

## 2020-12-23 NOTE — ED Provider Notes (Signed)
Patient placed in Quick Look pathway, seen and evaluated   Chief Complaint: Abdominal pain  HPI:   30 year old male presents with epigastric abdominal pain.  He was discharged from [facility] long ED about an hour ago after he was evaluated for similar and told this was due to his gastroparesis.  When he returned home his pain started to worsen again and he asked for reevaluation.  He did take a dose of Zofran at home.  He is not currently vomiting.  History of recurrent episodes of gastroparesis, diabetic and has not taken his insulin or eaten anything today.  Pain seems to be improving since arriving here in the ED.  ROS: + Abdominal pain  -Fever, vomiting  Physical Exam:   Gen: No distress  Neuro: Awake and Alert  Skin: Warm    Focused Exam: Mild epigastric tenderness, no peritoneal signs.    Initiation of care has begun. The patient has been counseled on the process, plan, and necessity for staying for the completion/evaluation, and the remainder of the medical screening examination  MSE was initiated and I personally evaluated the patient and placed orders (if any) at  3:44 PM on December 23, 2020.  The patient appears stable so that the remainder of the MSE may be completed by another provider.   Dartha Lodge, PA-C 12/23/20 1549    Koleen Distance, MD 12/23/20 [phone]

## 2020-12-25 ENCOUNTER — Encounter (HOSPITAL_COMMUNITY): Payer: Self-pay

## 2020-12-25 ENCOUNTER — Emergency Department (HOSPITAL_COMMUNITY)
Admission: EM | Admit: 2020-12-25 | Discharge: 2020-12-25 | Disposition: A | Discharge status: Home / Self Care | Payer: Medicaid Other | Attending: Emergency Medicine | Admitting: Emergency Medicine

## 2020-12-25 ENCOUNTER — Other Ambulatory Visit: Payer: Self-pay

## 2020-12-25 ENCOUNTER — Telehealth: Payer: Self-pay | Admitting: Physician Assistant

## 2020-12-25 DIAGNOSIS — Z794 Long term (current) use of insulin: Secondary | ICD-10-CM | POA: Insufficient documentation

## 2020-12-25 DIAGNOSIS — R1084 Generalized abdominal pain: Secondary | ICD-10-CM | POA: Diagnosis not present

## 2020-12-25 DIAGNOSIS — R109 Unspecified abdominal pain: Secondary | ICD-10-CM | POA: Diagnosis present

## 2020-12-25 DIAGNOSIS — E1042 Type 1 diabetes mellitus with diabetic polyneuropathy: Secondary | ICD-10-CM | POA: Insufficient documentation

## 2020-12-25 DIAGNOSIS — F17210 Nicotine dependence, cigarettes, uncomplicated: Secondary | ICD-10-CM | POA: Diagnosis not present

## 2020-12-25 DIAGNOSIS — R1114 Bilious vomiting: Secondary | ICD-10-CM | POA: Diagnosis not present

## 2020-12-25 LAB — COMPREHENSIVE METABOLIC PANEL
ALT: 16 U/L (ref 0–44)
AST: 21 U/L (ref 15–41)
Albumin: 4.6 g/dL (ref 3.5–5.0)
Alkaline Phosphatase: 104 U/L (ref 38–126)
Anion gap: 11 (ref 5–15)
BUN: 15 mg/dL (ref 6–20)
CO2: 24 mmol/L (ref 22–32)
Calcium: 9.8 mg/dL (ref 8.9–10.3)
Chloride: 105 mmol/L (ref 98–111)
Creatinine, Ser: 1.13 mg/dL (ref 0.61–1.24)
GFR, Estimated: >60 mL/min (ref >60)
Glucose, Bld: 125 mg/dL — ABNORMAL HIGH (ref 70–99)
Potassium: 3.6 mmol/L (ref 3.5–5.1)
Sodium: 140 mmol/L (ref 135–145)
Total Bilirubin: 1 mg/dL (ref 0.3–1.2)
Total Protein: 8.7 g/dL — ABNORMAL HIGH (ref 6.5–8.1)

## 2020-12-25 LAB — CBC WITH DIFFERENTIAL/PLATELET
Abs Immature Granulocytes: 0.03 10*3/uL (ref 0.00–0.07)
Basophils Absolute: 0.1 10*3/uL (ref 0.0–0.1)
Basophils Relative: 1 %
Eosinophils Absolute: 0.4 10*3/uL (ref 0.0–0.5)
Eosinophils Relative: 5 %
HCT: 47.1 % (ref 39.0–52.0)
Hemoglobin: 15.4 g/dL (ref 13.0–17.0)
Immature Granulocytes: 0 %
Lymphocytes Relative: 30 %
Lymphs Abs: 2.9 10*3/uL (ref 0.7–4.0)
MCH: 27.1 pg (ref 26.0–34.0)
MCHC: 32.7 g/dL (ref 30.0–36.0)
MCV: 82.8 fL (ref 80.0–100.0)
Monocytes Absolute: 0.6 10*3/uL (ref 0.1–1.0)
Monocytes Relative: 6 %
Neutro Abs: 5.6 10*3/uL (ref 1.7–7.7)
Neutrophils Relative %: 58 %
Platelets: 347 10*3/uL (ref 150–400)
RBC: 5.69 MIL/uL (ref 4.22–5.81)
RDW: 14.2 % (ref 11.5–15.5)
WBC: 9.6 10*3/uL (ref 4.0–10.5)
nRBC: 0 % (ref 0.0–0.2)

## 2020-12-25 LAB — ETHANOL: Alcohol, Ethyl (B): <10 mg/dL (ref <10)

## 2020-12-25 LAB — LIPASE, BLOOD: Lipase: 23 U/L (ref 11–51)

## 2020-12-25 MED ORDER — DROPERIDOL 2.5 MG/ML IJ SOLN
2.5000 mg | Freq: Once | INTRAMUSCULAR | Status: AC
  Administered 2020-12-25: 2.5 mg via INTRAMUSCULAR
  Filled 2020-12-25: qty 2

## 2020-12-25 MED ORDER — DICYCLOMINE HCL 10 MG/ML IM SOLN
20.0000 mg | Freq: Once | INTRAMUSCULAR | Status: AC
  Administered 2020-12-25: 20 mg via INTRAMUSCULAR
  Filled 2020-12-25: qty 2

## 2020-12-25 MED ORDER — SODIUM CHLORIDE 0.9 % IV BOLUS
1000.0000 mL | Freq: Once | INTRAVENOUS | Status: AC
  Administered 2020-12-25: 1000 mL via INTRAVENOUS

## 2020-12-25 MED ORDER — MORPHINE SULFATE (PF) 4 MG/ML IV SOLN
8.0000 mg | Freq: Once | INTRAVENOUS | Status: AC
  Administered 2020-12-25: 8 mg via INTRAVENOUS
  Filled 2020-12-25: qty 2

## 2020-12-25 NOTE — Telephone Encounter (Signed)
Pt's girlfriend Humphrey Rolls called looking to get an appt asap. She stated that pt has been in and out of the hospital for severe abd pain due to gastroparesis. He is at the ED at this moment waiting to be seen. However, she stated that they only give him pain medications and sent him his way. Pls call her at [phone].

## 2020-12-25 NOTE — ED Triage Notes (Signed)
Pt c/o abd pain, multiple ED visits in the last few days for the same, c/o pain and n/v. Hx of gastroparesis, states he has a GI appt "soon".

## 2020-12-25 NOTE — Telephone Encounter (Signed)
Multiple admissions recently and ED visits daily for abdominal pain and vomiting.  He has been scheduled to see Mike Gip PA on 12/27/20 at 1:30 . I was unable to reach the caller, but spoke with the patient.  He confirmed the appointment for Thursday at 1:30

## 2020-12-25 NOTE — Discharge Instructions (Addendum)
As discussed, it is important that you follow-up with your physician.  In addition, keep your scheduled appointment with our gastroenterology colleagues in 2 days.  Return here for concerning changes in your condition.

## 2020-12-25 NOTE — ED Provider Notes (Signed)
[facility] DEPT Provider Note   CSN: [id number] Arrival date & time: 12/25/20  0830     History Chief Complaint  Patient presents with  . Abdominal Pain    Jonathan Porter is a 30 y.o. male.  HPI Patient seen and evaluated in the triage area at0900, with completion of his initial evaluation at 9:13 AM Adult male with history of recurrent abdominal pain presents with abdominal pain that began 2 hours prior to ED arrival.  He states that the pain is the same as that his comparison multiple prior times, diffuse, sore, severe, with associated nausea.  No notable changes in behavior, diet, activity.  No other complaints including fever, dyspnea. With inability to control pain at home he presents for evaluation.    Past Medical History:  Diagnosis Date  . Diabetes mellitus without complication ([place])   . Gastroparesis     Patient Active Problem List   Diagnosis Date Noted  . Marijuana abuse 12/17/2020  . Intractable nausea and vomiting 07/03/2019  . Gastroparesis 07/02/2019  . Influenza vaccine refused 06/03/2019  . Erectile dysfunction 06/03/2019  . Obesity (BMI 30-39.9) 06/03/2019  . 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 Type 1 diabetes mellitus with hyperglycemia ([place]) 01/21/2019  . Paronychia of finger of left hand 01/20/2017  . 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 Diabetes type 1, uncontrolled ([place]) 08/23/2015  . Onychomycosis of toenail 08/23/2015  . Phimosis 08/07/2014    Past Surgical History:  Procedure Laterality Date  . NO PAST SURGERIES         Family History  Problem Relation Age of Onset  . Thyroid disease Mother   . Diabetes Maternal Aunt   . Diabetes Maternal Grandmother   . Diabetes Maternal Grandfather   . Thyroid disease Other     Social History   Tobacco Use  . Smoking status: Current Every Day Smoker    Packs/day: 0.50    Years: 6.00    Pack  years: 3.00    Types: Cigarettes  . Smokeless tobacco: Never Used  Vaping Use  . Vaping Use: Never used  Substance Use Topics  . Alcohol use: Yes    Comment: social   . Drug use: Yes    Types: Marijuana    Home Medications Prior to Admission medications   Medication Sig Start Date End Date Taking? Authorizing Provider  Accu-Chek FastClix Lancets MISC Use to check blood sugar up to 3 times daily. 06/03/19   Ladell Pier, MD  blood glucose meter kit and supplies KIT Dispense based on patient and insurance preference. Use up to four times daily as directed. (FOR ICD-9 250.00, 250.01). 12/01/19   Argentina Donovan, PA-C  Blood Glucose Monitoring Suppl (ACCU-CHEK GUIDE ME) w/Device KIT 1 kit by Does not apply route 3 (three) times daily. Use to check blood sugar up to 3 times daily. 06/03/19   Ladell Pier, MD  dicyclomine (BENTYL) 20 MG tablet Take 1 tablet (20 mg total) by mouth every 8 (eight) hours as needed for spasms. 12/18/20   Pokhrel, Laxman, MD  glucose blood test strip USE AS DIRECTED UP TO FOUR TIMES DAILY 09/28/20 09/28/21  Ladell Pier, MD  glucose blood test strip USE AS DIRECTED UP TO FOUR TIMES DAILY 09/28/20 09/28/21  Ladell Pier, MD  insulin aspart (NOVOLOG) 100 UNIT/ML FlexPen INJECT 15 UNITS INTO THE SKIN 3 (THREE) TIMES DAILY WITH MEALS. 10/16/20 10/16/21  Ladell Pier, MD  insulin aspart (NOVOLOG) 100 UNIT/ML FlexPen INJECT 15 UNITS INTO THE SKIN 3 (THREE) TIMES DAILY WITH MEALS. 09/28/20 09/28/21  Ladell Pier, MD  insulin glargine (LANTUS) 100 UNIT/ML Solostar Pen INJECT 15 UNITS INTO THE SKIN DAILY. 10/16/20 10/16/21  Ladell Pier, MD  metoCLOPramide (REGLAN) 10 MG tablet Take 1 tablet (10 mg total) by mouth every 8 (eight) hours as needed for nausea. 12/18/20   Pokhrel, Corrie Mckusick, MD  ondansetron (ZOFRAN ODT) 4 MG disintegrating tablet Take 1 tablet (4 mg total) by mouth every 8 (eight) hours as needed for nausea or vomiting. Patient not taking: No  sig reported 12/15/20   Caccavale, Sophia, PA-C  ondansetron (ZOFRAN) 4 MG tablet Take 1 tablet (4 mg total) by mouth every 6 (six) hours. 12/23/20   Couture, Cortni S, PA-C  sildenafil (VIAGRA) 50 MG tablet TAKE ONE TABLET BY MOUTH 30 MINUTES PRIOR TO SEXUAL INTERCOURSE AS NEEDED. LIMIT 1 IN 24 HOURS Patient taking differently: Take 50 mg by mouth as needed for erectile dysfunction (30 minutes prior to sexual intercourse as needed. limit 1 in 24hrs). 10/16/20   Ladell Pier, MD  TRUEPLUS 5-BEVEL PEN NEEDLES 31G X 8 MM MISC USE AS DIRECTED UP TO FOUR TIMES DAILY 01/05/20   Ladell Pier, MD  PROMETHAZINE HCL PO Take 1 tablet by mouth every 6 (six) hours as needed (nausea/vomitting).  02/23/20  [provider]  sucralfate (CARAFATE) 1 g tablet Take 1 tablet (1 g total) by mouth 4 (four) times daily for 14 days. Patient not taking: Reported on 02/23/2020 10/01/19 02/23/20  McDonald, Mia A, PA-C    Allergies    Shellfish allergy  Review of Systems   Review of Systems  Constitutional:       Per HPI, otherwise negative  HENT:       Per HPI, otherwise negative  Respiratory:       Per HPI, otherwise negative  Cardiovascular:       Per HPI, otherwise negative  Gastrointestinal: Negative for vomiting.  Endocrine:       Negative aside from HPI  Genitourinary:       Neg aside from HPI   Musculoskeletal:       Per HPI, otherwise negative  Skin: Negative.   Neurological: Negative for syncope.    Physical Exam Updated Vital Signs BP (!) 152/91 (BP Location: Right Arm)   Pulse 67   Temp 97.7 F (36.5 C) (Oral)   Resp (!) 24   Ht 5' 11"  (1.803 m)   Wt 98.4 kg   SpO2 96%   BMI 30.27 kg/m   Physical Exam Vitals and nursing note reviewed.  Constitutional:      Appearance: He is diaphoretic.     Comments: Uncomfortable appearing adult male walking about the room, stating he cannot get comfortable.  HENT:     Head: Normocephalic and atraumatic.  Eyes:     Conjunctiva/sclera:  Conjunctivae normal.  Cardiovascular:     Rate and Rhythm: Normal rate and regular rhythm.  Pulmonary:     Effort: Pulmonary effort is normal. No respiratory distress.     Breath sounds: No stridor.  Abdominal:     General: There is no distension.     Tenderness: There is generalized abdominal tenderness.  Skin:    General: Skin is warm.  Neurological:     Mental Status: He is alert and oriented to person, place, and time.  Psychiatric:     Comments:  Anxious      ED Results / Procedures / Treatments   Labs (all labs ordered are listed, but only abnormal results are displayed) Labs Reviewed  COMPREHENSIVE METABOLIC PANEL - Abnormal; Notable for the following components:      Result Value   Glucose, Bld 125 (*)    Total Protein 8.7 (*)    All other components within normal limits  ETHANOL  LIPASE, BLOOD  CBC WITH DIFFERENTIAL/PLATELET    Procedures Procedures   Medications Ordered in ED Medications  droperidol (INAPSINE) 2.5 MG/ML injection 2.5 mg (has no administration in time range)    ED Course  I have reviewed the triage vital signs and the nursing notes.  Pertinent labs & imaging results that were available during my care of the patient were reviewed by me and considered in my medical decision making (see chart for details). Chart review notable for 9 prior ED visits, typically with concern for similar abdominal pain.  Patient has history on chart, of gastroparesis, intractable nausea, vomiting. Patient had labs performed, received droperidol, Bentyl after initial evaluation.     9:54 AM Patient awake, alert, has received Bentyl, droperidol, remains agitated, anxious, walking about the room, requesting morphine.  He is now accompanied by his girlfriend.  Patient notes that he sees [facility]-power gastroenterology for care, but has not seen them in about 2 years.  11:15 AM Patient awake, alert, speaking clearly, in no distress, now calm, pain has resolved.  In the  interim, he has contacted GI to arrange follow-up, and now has a scheduled in 48 hours.  Adult male with history of recurrent abdominal pain presents with similar episode, and initially is diaphoretic, quite uncomfortable in appearance.  Patient improved substantially after Bentyl, droperidol, morphine, fluids.  Labs reviewed, reassuring, no evidence for acute new pathology.  Given his resolution of pain, low suspicion for peritonitis, no evidence for acute new abdominal pathology.  Patient amenable to, appropriate for discharge with outpatient follow-up in 48 hours. MDM Rules/Calculators/A&P MDM Number of Diagnoses or Management Options Bilious vomiting with nausea: new, needed workup Generalized abdominal pain: new, needed workup   Amount and/or Complexity of Data Reviewed Clinical lab tests: ordered and reviewed Tests in the medicine section of CPT: reviewed and ordered Decide to obtain previous medical records or to obtain history from someone other than the patient: yes Obtain history from someone other than the patient: yes Review and summarize past medical records: yes  Risk of Complications, Morbidity, and/or Mortality Presenting problems: high Diagnostic procedures: high Management options: high  Critical Care Total time providing critical care: < 30 minutes  Patient Progress Patient progress: stable  Final Clinical Impression(s) / ED Diagnoses Final diagnoses:  Generalized abdominal pain  Bilious vomiting with nausea     Carmin Muskrat, MD 12/25/20 1116

## 2020-12-26 ENCOUNTER — Emergency Department (HOSPITAL_COMMUNITY)
Admission: EM | Admit: 2020-12-26 | Discharge: 2020-12-26 | Discharge status: Against Medical Advice | Payer: Medicaid Other | Source: Home / Self Care

## 2020-12-26 ENCOUNTER — Other Ambulatory Visit: Payer: Self-pay

## 2020-12-26 ENCOUNTER — Encounter (HOSPITAL_COMMUNITY): Payer: Self-pay

## 2020-12-26 ENCOUNTER — Emergency Department (HOSPITAL_COMMUNITY)
Admission: EM | Admit: 2020-12-26 | Discharge: 2020-12-26 | Disposition: A | Discharge status: Home / Self Care | Payer: Medicaid Other | Attending: Emergency Medicine | Admitting: Emergency Medicine

## 2020-12-26 DIAGNOSIS — K3184 Gastroparesis: Secondary | ICD-10-CM | POA: Insufficient documentation

## 2020-12-26 DIAGNOSIS — F17210 Nicotine dependence, cigarettes, uncomplicated: Secondary | ICD-10-CM | POA: Insufficient documentation

## 2020-12-26 DIAGNOSIS — E1065 Type 1 diabetes mellitus with hyperglycemia: Secondary | ICD-10-CM | POA: Insufficient documentation

## 2020-12-26 DIAGNOSIS — E1042 Type 1 diabetes mellitus with diabetic polyneuropathy: Secondary | ICD-10-CM | POA: Insufficient documentation

## 2020-12-26 DIAGNOSIS — R1084 Generalized abdominal pain: Secondary | ICD-10-CM | POA: Diagnosis present

## 2020-12-26 DIAGNOSIS — Z794 Long term (current) use of insulin: Secondary | ICD-10-CM | POA: Insufficient documentation

## 2020-12-26 DIAGNOSIS — R1013 Epigastric pain: Secondary | ICD-10-CM | POA: Insufficient documentation

## 2020-12-26 DIAGNOSIS — Z5321 Procedure and treatment not carried out due to patient leaving prior to being seen by health care provider: Secondary | ICD-10-CM | POA: Insufficient documentation

## 2020-12-26 DIAGNOSIS — R109 Unspecified abdominal pain: Secondary | ICD-10-CM | POA: Insufficient documentation

## 2020-12-26 DIAGNOSIS — R110 Nausea: Secondary | ICD-10-CM | POA: Insufficient documentation

## 2020-12-26 DIAGNOSIS — E1040 Type 1 diabetes mellitus with diabetic neuropathy, unspecified: Secondary | ICD-10-CM | POA: Insufficient documentation

## 2020-12-26 LAB — CBC WITH DIFFERENTIAL/PLATELET
Abs Immature Granulocytes: 0.01 10*3/uL (ref 0.00–0.07)
Basophils Absolute: 0.1 10*3/uL (ref 0.0–0.1)
Basophils Relative: 1 %
Eosinophils Absolute: 0.4 10*3/uL (ref 0.0–0.5)
Eosinophils Relative: 5 %
HCT: 47 % (ref 39.0–52.0)
Hemoglobin: 15.4 g/dL (ref 13.0–17.0)
Immature Granulocytes: 0 %
Lymphocytes Relative: 43 %
Lymphs Abs: 3.2 10*3/uL (ref 0.7–4.0)
MCH: 27.1 pg (ref 26.0–34.0)
MCHC: 32.8 g/dL (ref 30.0–36.0)
MCV: 82.7 fL (ref 80.0–100.0)
Monocytes Absolute: 0.5 10*3/uL (ref 0.1–1.0)
Monocytes Relative: 7 %
Neutro Abs: 3.3 10*3/uL (ref 1.7–7.7)
Neutrophils Relative %: 44 %
Platelets: 348 10*3/uL (ref 150–400)
RBC: 5.68 MIL/uL (ref 4.22–5.81)
RDW: 14.3 % (ref 11.5–15.5)
WBC: 7.5 10*3/uL (ref 4.0–10.5)
nRBC: 0 % (ref 0.0–0.2)

## 2020-12-26 LAB — BASIC METABOLIC PANEL
Anion gap: 15 (ref 5–15)
BUN: 13 mg/dL (ref 6–20)
CO2: 18 mmol/L — ABNORMAL LOW (ref 22–32)
Calcium: 9.7 mg/dL (ref 8.9–10.3)
Chloride: 102 mmol/L (ref 98–111)
Creatinine, Ser: 1.16 mg/dL (ref 0.61–1.24)
GFR, Estimated: >60 mL/min (ref >60)
Glucose, Bld: 279 mg/dL — ABNORMAL HIGH (ref 70–99)
Potassium: 3.7 mmol/L (ref 3.5–5.1)
Sodium: 135 mmol/L (ref 135–145)

## 2020-12-26 LAB — COMPREHENSIVE METABOLIC PANEL
ALT: 14 U/L (ref 0–44)
AST: 18 U/L (ref 15–41)
Albumin: 4.7 g/dL (ref 3.5–5.0)
Alkaline Phosphatase: 101 U/L (ref 38–126)
Anion gap: 11 (ref 5–15)
BUN: 11 mg/dL (ref 6–20)
CO2: 23 mmol/L (ref 22–32)
Calcium: 9.7 mg/dL (ref 8.9–10.3)
Chloride: 103 mmol/L (ref 98–111)
Creatinine, Ser: 1.19 mg/dL (ref 0.61–1.24)
GFR, Estimated: >60 mL/min (ref >60)
Glucose, Bld: 219 mg/dL — ABNORMAL HIGH (ref 70–99)
Potassium: 3.7 mmol/L (ref 3.5–5.1)
Sodium: 137 mmol/L (ref 135–145)
Total Bilirubin: 1 mg/dL (ref 0.3–1.2)
Total Protein: 8.6 g/dL — ABNORMAL HIGH (ref 6.5–8.1)

## 2020-12-26 LAB — LIPASE, BLOOD: Lipase: 20 U/L (ref 11–51)

## 2020-12-26 LAB — CBG MONITORING, ED: Glucose-Capillary: 249 mg/dL — ABNORMAL HIGH (ref 70–99)

## 2020-12-26 MED ORDER — PANTOPRAZOLE SODIUM 40 MG IV SOLR
40.0000 mg | Freq: Once | INTRAVENOUS | Status: AC
  Administered 2020-12-26: 40 mg via INTRAVENOUS
  Filled 2020-12-26: qty 40

## 2020-12-26 MED ORDER — METOCLOPRAMIDE HCL 5 MG/ML IJ SOLN
10.0000 mg | Freq: Once | INTRAMUSCULAR | Status: AC
  Administered 2020-12-26: 10 mg via INTRAVENOUS
  Filled 2020-12-26: qty 2

## 2020-12-26 MED ORDER — DIPHENHYDRAMINE HCL 50 MG/ML IJ SOLN
12.5000 mg | Freq: Once | INTRAMUSCULAR | Status: AC
  Administered 2020-12-26: 12.5 mg via INTRAVENOUS
  Filled 2020-12-26: qty 1

## 2020-12-26 NOTE — ED Provider Notes (Signed)
[facility] DEPT Provider Note   CSN: [id number] Arrival date & time: 12/26/20  1004     History Chief Complaint  Patient presents with  . Abdominal Pain    Jonathan Porter is a 30 y.o. male with past medical history significant for marijuana use, diabetes, and gastroparesis who returns to the ED for abdominal pain.  I reviewed his medical record and this is the 4-5 times in the past week that he has been evaluated for same complaints.  On my examination, patient reports that his pain has not improved.  He has been trying dicyclomine, haloperidol, and Zofran ODT at home without any relief of his abdominal discomfort.  Patient denies any changes to his abdominal discomfort.  States that is the same.  He admits that he has an appointment with gastroenterology tomorrow, but states that he cannot remain comfortable at home and needed to come to the ED for management.  He reports that he has not smoked marijuana since prior to his onset of abdominal pain 1 week ago.  He also denies any other illicit drug use.  Patient denies any active vomiting or recent hematemesis, melena or hematochezia, other changes in his bowel habits, dysuria or other urinary symptoms, chest pain or difficulty breathing, back pain, fevers or chills, or other symptoms.   HPI     Past Medical History:  Diagnosis Date  . Diabetes mellitus without complication ([place])   . Gastroparesis     Patient Active Problem List   Diagnosis Date Noted  . Marijuana abuse 12/17/2020  . Intractable nausea and vomiting 07/03/2019  . Gastroparesis 07/02/2019  . Influenza vaccine refused 06/03/2019  . Erectile dysfunction 06/03/2019  . Obesity (BMI 30-39.9) 06/03/2019  . Type 1 diabetes mellitus with diabetic polyneuropathy ([place]) 01/21/2019  . Type 1 diabetes mellitus with hyperglycemia ([place]) 01/21/2019  . Paronychia of finger of left hand 01/20/2017  . Genital warts due to HPV (human  papillomavirus) 04/18/2016  . Diabetes type 1, uncontrolled ([place]) 08/23/2015  . Onychomycosis of toenail 08/23/2015  . Phimosis 08/07/2014    Past Surgical History:  Procedure Laterality Date  . NO PAST SURGERIES         Family History  Problem Relation Age of Onset  . Thyroid disease Mother   . Diabetes Maternal Aunt   . Diabetes Maternal Grandmother   . Diabetes Maternal Grandfather   . Thyroid disease Other     Social History   Tobacco Use  . Smoking status: Current Every Day Smoker    Packs/day: 0.50    Years: 6.00    Pack years: 3.00    Types: Cigarettes  . Smokeless tobacco: Never Used  Vaping Use  . Vaping Use: Never used  Substance Use Topics  . Alcohol use: Yes    Comment: social   . Drug use: Yes    Types: Marijuana    Home Medications Prior to Admission medications   Medication Sig Start Date End Date Taking? Authorizing Provider  Accu-Chek FastClix Lancets MISC Use to check blood sugar up to 3 times daily. 06/03/19   Ladell Pier, MD  blood glucose meter kit and supplies KIT Dispense based on patient and insurance preference. Use up to four times daily as directed. (FOR ICD-9 250.00, 250.01). 12/01/19   Argentina Donovan, PA-C  Blood Glucose Monitoring Suppl (ACCU-CHEK GUIDE ME) w/Device KIT 1 kit by Does not apply route 3 (three) times daily. Use to check blood sugar up to 3  times daily. 06/03/19   Ladell Pier, MD  dicyclomine (BENTYL) 20 MG tablet Take 1 tablet (20 mg total) by mouth every 8 (eight) hours as needed for spasms. 12/18/20   Pokhrel, Laxman, MD  glucose blood test strip USE AS DIRECTED UP TO FOUR TIMES DAILY Patient taking differently: USE AS DIRECTED UP TO FOUR TIMES DAILY 09/28/20 09/28/21  Ladell Pier, MD  glucose blood test strip USE AS DIRECTED UP TO FOUR TIMES DAILY Patient taking differently: USE AS DIRECTED UP TO FOUR TIMES DAILY 09/28/20 09/28/21  Ladell Pier, MD  insulin aspart (NOVOLOG) 100 UNIT/ML FlexPen INJECT  15 UNITS INTO THE SKIN 3 (THREE) TIMES DAILY WITH MEALS. Patient not taking: Reported on 12/25/2020 10/16/20 10/16/21  Ladell Pier, MD  insulin aspart (NOVOLOG) 100 UNIT/ML FlexPen INJECT 15 UNITS INTO THE SKIN 3 (THREE) TIMES DAILY WITH MEALS. Patient taking differently: Inject 15 Units into the skin 3 (three) times daily with meals. 09/28/20 09/28/21  Ladell Pier, MD  insulin glargine (LANTUS) 100 UNIT/ML Solostar Pen INJECT 15 UNITS INTO THE SKIN DAILY. Patient taking differently: Inject 15 Units into the skin at bedtime. 10/16/20 10/16/21  Ladell Pier, MD  metoCLOPramide (REGLAN) 10 MG tablet Take 1 tablet (10 mg total) by mouth every 8 (eight) hours as needed for nausea. Patient not taking: Reported on 12/25/2020 12/18/20   Pokhrel, Corrie Mckusick, MD  ondansetron (ZOFRAN ODT) 4 MG disintegrating tablet Take 1 tablet (4 mg total) by mouth every 8 (eight) hours as needed for nausea or vomiting. Patient not taking: No sig reported 12/15/20   Caccavale, Sophia, PA-C  ondansetron (ZOFRAN) 4 MG tablet Take 1 tablet (4 mg total) by mouth every 6 (six) hours. 12/23/20   Couture, Cortni S, PA-C  sildenafil (VIAGRA) 50 MG tablet TAKE ONE TABLET BY MOUTH 30 MINUTES PRIOR TO SEXUAL INTERCOURSE AS NEEDED. LIMIT 1 IN 24 HOURS Patient taking differently: Take 50 mg by mouth as needed for erectile dysfunction (30 minutes prior to sexual intercourse as needed. limit 1 in 24hrs). 10/16/20   Ladell Pier, MD  TRUEPLUS 5-BEVEL PEN NEEDLES 31G X 8 MM MISC USE AS DIRECTED UP TO FOUR TIMES DAILY 01/05/20   Ladell Pier, MD  PROMETHAZINE HCL PO Take 1 tablet by mouth every 6 (six) hours as needed (nausea/vomitting).  02/23/20  [provider]  sucralfate (CARAFATE) 1 g tablet Take 1 tablet (1 g total) by mouth 4 (four) times daily for 14 days. Patient not taking: Reported on 02/23/2020 10/01/19 02/23/20  McDonald, Mia A, PA-C    Allergies    Shellfish allergy  Review of Systems   Review of Systems  All  other systems reviewed and are negative.   Physical Exam Updated Vital Signs BP (!) 159/107   Pulse 73   Temp 98.2 F (36.8 C) (Oral)   Resp 20   SpO2 100%   Physical Exam Vitals and nursing note reviewed. Exam conducted with a chaperone present.  Constitutional:      Appearance: He is not toxic-appearing.     Comments: Pacing around the room.  Uncomfortable.  HENT:     Head: Normocephalic and atraumatic.  Eyes:     General: No scleral icterus.    Conjunctiva/sclera: Conjunctivae normal.  Cardiovascular:     Rate and Rhythm: Normal rate.     Pulses: Normal pulses.  Pulmonary:     Effort: Pulmonary effort is normal. No respiratory distress.  Abdominal:     General:  Abdomen is flat. There is no distension.     Palpations: Abdomen is soft. There is no mass.     Tenderness: There is no abdominal tenderness. There is no guarding.  Musculoskeletal:        General: Normal range of motion.  Skin:    General: Skin is dry.  Neurological:     General: No focal deficit present.     Mental Status: He is alert and oriented to person, place, and time.     GCS: GCS eye subscore is 4. GCS verbal subscore is 5. GCS motor subscore is 6.  Psychiatric:        Mood and Affect: Mood normal.        Behavior: Behavior normal.        Thought Content: Thought content normal.     ED Results / Procedures / Treatments   Labs (all labs ordered are listed, but only abnormal results are displayed) Labs Reviewed  COMPREHENSIVE METABOLIC PANEL - Abnormal; Notable for the following components:      Result Value   Glucose, Bld 219 (*)    Total Protein 8.6 (*)    All other components within normal limits  CBC WITH DIFFERENTIAL/PLATELET  LIPASE, BLOOD  URINALYSIS, ROUTINE W REFLEX MICROSCOPIC  RAPID URINE DRUG SCREEN, HOSP PERFORMED    EKG None  Radiology No results found.  Procedures Procedures   Medications Ordered in ED Medications  pantoprazole (PROTONIX) injection 40 mg (40 mg  Intravenous Given 12/26/20 1055)  metoCLOPramide (REGLAN) injection 10 mg (10 mg Intravenous Given 12/26/20 1055)  diphenhydrAMINE (BENADRYL) injection 12.5 mg (12.5 mg Intravenous Given 12/26/20 1055)    ED Course  I have reviewed the triage vital signs and the nursing notes.  Pertinent labs & imaging results that were available during my care of the patient were reviewed by me and considered in my medical decision making (see chart for details).    MDM Rules/Calculators/A&P                          FINNEUS KANESHIRO was evaluated in Emergency Department on 12/26/2020 for the symptoms described in the history of present illness. He was evaluated in the context of the global COVID-19 pandemic, which necessitated consideration that the patient might be at risk for infection with the SARS-CoV-2 virus that causes COVID-19. Institutional protocols and algorithms that pertain to the evaluation of patients at risk for COVID-19 are in a state of rapid change based on information released by regulatory bodies including the CDC and federal and state organizations. These policies and algorithms were followed during the patient's care in the ED.  I personally reviewed patient's medical chart and all notes from triage and staff during today's encounter. I have also ordered and reviewed all labs and imaging that I felt to be medically necessary in the evaluation of this patient's complaints and with consideration of their physical exam. If needed, translation services were available and utilized.   Patient presented to the ED for continued abdominal pain.  He has been worked up multiple other occasions, all negative.  He has an appointment with gastroenterology tomorrow he understands the importance of specialty outpatient follow-up given his poorly controlled symptoms of nonspecific abdominal pain, likely related to his diabetic gastroparesis.  He is asking for narcotics and states that fentanyl and morphine are the  only medications that work for him.  I informed him that I will not be giving him  narcotics as this likely will not be improving his symptoms, particularly if it is related to gastroparesis.  We will start him on Protonix, Reglan, and provide 25 mg IV Benadryl.  On reassessment, patient is aggravated and acting aggressively towards staff.  He is upset that he is not receiving narcotic medications.  I offered CT imaging to evaluate for emergent pathology, but patient declined.  He initially called me a cracker because I would not give him any stronger pain medication, but we discussed why it might only worsen his symptoms and is not going to effectively treat his pain.  He eventually apologized and expressed that he is simply frustrated given his pain symptoms and lack of improvement.  CT was ordered, but shortly thereafter I was informed by nursing staff that he is continuing to act aggressively towards staff.  He has been evaluated multiple times for same complaints.  His laboratory work-up is unremarkable and his vital signs are stable.  He is nontender on exam.  Feel as though discharge is reasonable.  There also appears to be an appointment listed with gastroenterology for today at 1:30 PM.  He can go there from the ED.  Dr. Dina Rich aware of patient and agrees with plan.   Final Clinical Impression(s) / ED Diagnoses Final diagnoses:  Generalized abdominal pain    Rx / DC Orders ED Discharge Orders    None       Corena Herter, PA-C 12/26/20 1151    Horton, Alvin Critchley, DO 12/27/20 1011

## 2020-12-26 NOTE — Discharge Instructions (Addendum)
Go to your appointment with gastroenterology tomorrow, as scheduled.  Continue to take your at-home medications.  Return to the ED or seek immediate medical attention should you develop any new or worsening symptoms.

## 2020-12-26 NOTE — ED Provider Notes (Cosign Needed)
Patient placed in Quick Look pathway, seen and evaluated   Chief Complaint: Abdominal  pain   HPI:   Pt has a history of gastroparesis  ROS: NO FEVER NO CHILLS  Physical Exam:   Gen: No distress  Neuro: Awake and Alert  Skin: Warm    Focused Exam: pacing in room vitals stable   Initiation of care has begun. The patient has been counseled on the process, plan, and necessity for staying for the completion/evaluation, and the remainder of the medical screening examination   Elson Areas, PA-C 12/26/20 1432

## 2020-12-26 NOTE — ED Notes (Signed)
Pt repeatedly pressing emergency button. Charge nurse and unit AD at bedside. Pt cursing at staff and yelling.

## 2020-12-26 NOTE — ED Notes (Signed)
Pt decided to leave 

## 2020-12-26 NOTE — ED Notes (Addendum)
This writer went to bedside d/t patient repeatedly hitting emergency call button.  Pt yelling, cussing, and demanding different pain medications.  Per staff, PA had already explained why Pt was and was not receiving certain medication.  PA informed of behavior/demands and will be discharging Pt.  PA and Attending report care plan will not change.  This Clinical research associate made Pt aware of this information. Pt continues to yelling and sts "fine I'll just check right back in."  Pt again made aware his care plan would not be changing.    Cherrelle RN attempting to have a conversation/reason with the patient w/o successful.  She and this Clinical research associate discussed his concerns around pain medication and possible need for resources.  When discharge papers were printed, it was noted the patient has an appointment with [facility] this afternoon.

## 2020-12-26 NOTE — ED Triage Notes (Signed)
Pt was seen here earlier with abd pain and nausea. Per EMS pt reports CBG of 305 at this time. Pt is calm and cooperative at this time.

## 2020-12-26 NOTE — ED Notes (Signed)
Pt stated "he's good on vitals". Pt refused vital signs.

## 2020-12-26 NOTE — ED Notes (Signed)
Pt pacing in doorway asking for pain medication, pressed emergency call button twice. Provider aware, spoken to pt already.

## 2020-12-26 NOTE — ED Triage Notes (Signed)
Pt arrived via walk in c/o abd pain. Pt seen at [facility] today for same. Pt denies any improvement. Pt has h/x gastroparesis.

## 2020-12-26 NOTE — ED Triage Notes (Signed)
Emergency Medicine Provider Triage Evaluation Note  Jonathan Porter , a 30 y.o. male  was evaluated in triage.  Pt complains of abd pain, hx of gastroparesis.  Here for same earlier..  Review of Systems  Positive: abd pain, Negative: Nausea/vomiting/fever/diarrhea  Physical Exam  Ht 5' 11.75" (1.822 m)   Wt 98.4 kg   BMI 29.64 kg/m  Gen:   Awake, no distress   HEENT:  Atraumatic   Resp:  Normal effort   Cardiac:  Normal rate   Abd:   Nondistended, tender to epigastrium MSK:   Moves extremities without difficulty  Neuro:  Speech clear   Medical Decision Making  Medically screening exam initiated at 10:46 PM.  Appropriate orders placed.  Jonathan Porter was informed that the remainder of the evaluation will be completed by another provider, this initial triage assessment does not replace that evaluation, and the importance of remaining in the ED until their evaluation is complete.  Clinical Impression  Abdominal pain   Jonathan Bucco, MD 12/26/20 2247

## 2020-12-26 NOTE — ED Triage Notes (Signed)
Pt arrived via walk in, c/o abd pain. Seen recently for same, denies any improvement. Hx of gastroparesis. Appt with GI tomorrow but pain worsening today.

## 2020-12-27 ENCOUNTER — Encounter: Payer: Self-pay | Admitting: Physician Assistant

## 2020-12-27 ENCOUNTER — Emergency Department (HOSPITAL_COMMUNITY)
Admission: EM | Admit: 2020-12-27 | Discharge: 2020-12-27 | Disposition: A | Discharge status: Home / Self Care | Payer: Medicaid Other | Source: Home / Self Care | Attending: Emergency Medicine | Admitting: Emergency Medicine

## 2020-12-27 ENCOUNTER — Ambulatory Visit (INDEPENDENT_AMBULATORY_CARE_PROVIDER_SITE_OTHER): Payer: Medicaid Other | Admitting: Physician Assistant

## 2020-12-27 VITALS — BP 110/76 | HR 92 | Ht 70.0 in | Wt 226.4 lb

## 2020-12-27 DIAGNOSIS — R1010 Upper abdominal pain, unspecified: Secondary | ICD-10-CM

## 2020-12-27 DIAGNOSIS — R112 Nausea with vomiting, unspecified: Secondary | ICD-10-CM | POA: Diagnosis not present

## 2020-12-27 DIAGNOSIS — K219 Gastro-esophageal reflux disease without esophagitis: Secondary | ICD-10-CM | POA: Diagnosis not present

## 2020-12-27 DIAGNOSIS — R109 Unspecified abdominal pain: Secondary | ICD-10-CM | POA: Diagnosis not present

## 2020-12-27 DIAGNOSIS — Z8639 Personal history of other endocrine, nutritional and metabolic disease: Secondary | ICD-10-CM

## 2020-12-27 LAB — CBG MONITORING, ED: Glucose-Capillary: 252 mg/dL — ABNORMAL HIGH (ref 70–99)

## 2020-12-27 LAB — BLOOD GAS, VENOUS
Acid-base deficit: 0.7 mmol/L (ref 0.0–2.0)
Bicarbonate: 23.8 mmol/L (ref 20.0–28.0)
O2 Saturation: 81.9 %
Patient temperature: 98.6
pCO2, Ven: 41 mmHg — ABNORMAL LOW (ref 44.0–60.0)
pH, Ven: 7.382 (ref 7.250–7.430)
pO2, Ven: 49.2 mmHg — ABNORMAL HIGH (ref 32.0–45.0)

## 2020-12-27 MED ORDER — OMEPRAZOLE 40 MG PO CPDR: 40.0000 mg | DELAYED_RELEASE_CAPSULE | Freq: Every day | ORAL | 8 refills | Status: DC

## 2020-12-27 MED ORDER — HALOPERIDOL LACTATE 5 MG/ML IJ SOLN
5.0000 mg | Freq: Once | INTRAMUSCULAR | Status: AC
  Administered 2020-12-27: 5 mg via INTRAMUSCULAR
  Filled 2020-12-27: qty 1

## 2020-12-27 MED ORDER — PROMETHAZINE HCL 25 MG RE SUPP: 25.0000 mg | Freq: Three times a day (TID) | RECTAL | 2 refills | Status: DC | PRN

## 2020-12-27 MED ORDER — METOCLOPRAMIDE HCL 10 MG PO TABS: ORAL_TABLET | ORAL | 6 refills | Status: DC

## 2020-12-27 NOTE — Patient Instructions (Signed)
If you are age 32 or older, your body mass index should be between 23-30. Your Body mass index is 32.48 kg/m. If this is out of the aforementioned range listed, please consider follow up with your Primary Care Provider.  If you are age 39 or younger, your body mass index should be between 19-25. Your Body mass index is 32.48 kg/m. If this is out of the aformentioned range listed, please consider follow up with your Primary Care Provider.   You have been scheduled for an endoscopy. Please follow written instructions given to you at your visit today. If you use inhalers (even only as needed), please bring them with you on the day of your procedure.  Refills of Reglan have been sent to your pharmacy.  Start Omeprazole 40 mg 1 capsule before breakfast Start promethazine 25 mg 1 suppository every 8 hours as needed for nausea/vomiting  Follow step 2-3 of the Gastroparesis diet.  STOP Bentyl/Dicyclomine   Thank you for entrusting me with your care and choosing [facility].  Amy Esterwood, PA-C

## 2020-12-27 NOTE — ED Provider Notes (Signed)
[facility] DEPT Provider Note   CSN: [id number] Arrival date & time: 12/26/20  2237     History Chief Complaint  Patient presents with  . Hyperglycemia  . Nausea  . Abdominal Pain    Jonathan Porter is a 30 y.o. male.  Patient with history of T1DM, gastroparesis, chronic/recurrent abdominal pain, presents with epigastric abdominal pain and nausea without vomiting. He also reports elevated blood sugar to 305, prompting EMS contact. No fever, diarrhea.   The history is provided by the patient. No language interpreter was used.  Hyperglycemia Associated symptoms: abdominal pain and nausea   Associated symptoms: no fever, no shortness of breath and no vomiting   Abdominal Pain Associated symptoms: nausea   Associated symptoms: no chills, no fever, no shortness of breath and no vomiting        Past Medical History:  Diagnosis Date  . Diabetes mellitus without complication ([place])   . Gastroparesis     Patient Active Problem List   Diagnosis Date Noted  . Marijuana abuse 12/17/2020  . Intractable nausea and vomiting 07/03/2019  . Gastroparesis 07/02/2019  . Influenza vaccine refused 06/03/2019  . Erectile dysfunction 06/03/2019  . Obesity (BMI 30-39.9) 06/03/2019  . Type 1 diabetes mellitus with diabetic polyneuropathy ([place]) 01/21/2019  . Type 1 diabetes mellitus with hyperglycemia ([place]) 01/21/2019  . Paronychia of finger of left hand 01/20/2017  . 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 Diabetes type 1, uncontrolled ([place]) 08/23/2015  . Onychomycosis of toenail 08/23/2015  . Phimosis 08/07/2014    Past Surgical History:  Procedure Laterality Date  . NO PAST SURGERIES         Family History  Problem Relation Age of Onset  . Thyroid disease Mother   . Diabetes Maternal Aunt   . Diabetes Maternal Grandmother   . Diabetes Maternal Grandfather   . Thyroid disease Other     Social History   Tobacco Use  .  Smoking status: Current Every Day Smoker    Packs/day: 0.50    Years: 6.00    Pack years: 3.00    Types: Cigarettes  . Smokeless tobacco: Never Used  Vaping Use  . Vaping Use: Never used  Substance Use Topics  . Alcohol use: Yes    Comment: social   . Drug use: Yes    Types: Marijuana    Home Medications Prior to Admission medications   Medication Sig Start Date End Date Taking? Authorizing Provider  dicyclomine (BENTYL) 20 MG tablet Take 1 tablet (20 mg total) by mouth every 8 (eight) hours as needed for spasms. 12/18/20  Yes Pokhrel, Laxman, MD  glucose blood test strip USE AS DIRECTED UP TO FOUR TIMES DAILY Patient taking differently: USE AS DIRECTED UP TO FOUR TIMES DAILY 09/28/20 09/28/21 Yes Ladell Pier, MD  glucose blood test strip USE AS DIRECTED UP TO FOUR TIMES DAILY Patient taking differently: USE AS DIRECTED UP TO FOUR TIMES DAILY 09/28/20 09/28/21 Yes Ladell Pier, MD  insulin aspart (NOVOLOG) 100 UNIT/ML FlexPen INJECT 15 UNITS INTO THE SKIN 3 (THREE) TIMES DAILY WITH MEALS. Patient taking differently: Inject 15 Units into the skin 3 (three) times daily with meals. 09/28/20 09/28/21 Yes Ladell Pier, MD  insulin glargine (LANTUS) 100 UNIT/ML Solostar Pen INJECT 15 UNITS INTO THE SKIN DAILY. Patient taking differently: Inject 15 Units into the skin at bedtime. 10/16/20 10/16/21 Yes Ladell Pier, MD  sildenafil (VIAGRA) 50 MG tablet TAKE ONE  TABLET BY MOUTH 30 MINUTES PRIOR TO SEXUAL INTERCOURSE AS NEEDED. LIMIT 1 IN 24 HOURS Patient taking differently: Take 50 mg by mouth as needed for erectile dysfunction (30 minutes prior to sexual intercourse as needed. limit 1 in 24hrs). 10/16/20  Yes Ladell Pier, MD  TRUEPLUS 5-BEVEL PEN NEEDLES 31G X 8 MM MISC USE AS DIRECTED UP TO FOUR TIMES DAILY 01/05/20  Yes Ladell Pier, MD  Accu-Chek FastClix Lancets MISC Use to check blood sugar up to 3 times daily. 06/03/19   Ladell Pier, MD  blood glucose meter kit  and supplies KIT Dispense based on patient and insurance preference. Use up to four times daily as directed. (FOR ICD-9 250.00, 250.01). 12/01/19   Argentina Donovan, PA-C  Blood Glucose Monitoring Suppl (ACCU-CHEK GUIDE ME) w/Device KIT 1 kit by Does not apply route 3 (three) times daily. Use to check blood sugar up to 3 times daily. 06/03/19   Ladell Pier, MD  insulin aspart (NOVOLOG) 100 UNIT/ML FlexPen INJECT 15 UNITS INTO THE SKIN 3 (THREE) TIMES DAILY WITH MEALS. Patient not taking: Reported on 12/25/2020 10/16/20 10/16/21  Ladell Pier, MD  metoCLOPramide (REGLAN) 10 MG tablet Take 1 tablet (10 mg total) by mouth every 8 (eight) hours as needed for nausea. Patient not taking: No sig reported 12/18/20   Pokhrel, Corrie Mckusick, MD  ondansetron (ZOFRAN ODT) 4 MG disintegrating tablet Take 1 tablet (4 mg total) by mouth every 8 (eight) hours as needed for nausea or vomiting. Patient not taking: No sig reported 12/15/20   Caccavale, Sophia, PA-C  ondansetron (ZOFRAN) 4 MG tablet Take 1 tablet (4 mg total) by mouth every 6 (six) hours. Patient not taking: Reported on 12/27/2020 12/23/20   Couture, Cortni S, PA-C  PROMETHAZINE HCL PO Take 1 tablet by mouth every 6 (six) hours as needed (nausea/vomitting).  02/23/20  [provider]  sucralfate (CARAFATE) 1 g tablet Take 1 tablet (1 g total) by mouth 4 (four) times daily for 14 days. Patient not taking: Reported on 02/23/2020 10/01/19 02/23/20  McDonald, Mia A, PA-C    Allergies    Shellfish allergy  Review of Systems   Review of Systems  Constitutional: Negative for chills and fever.  HENT: Negative.   Respiratory: Negative.  Negative for shortness of breath.   Cardiovascular: Negative.   Gastrointestinal: Positive for abdominal pain and nausea. Negative for vomiting.  Musculoskeletal: Negative.  Negative for back pain.  Skin: Negative.   Neurological: Negative.     Physical Exam Updated Vital Signs BP (!) 153/103   Pulse 86   Temp 98.9  F (37.2 C) (Oral)   Resp 19   Ht 5' 11.75" (1.822 m)   Wt 98.4 kg   SpO2 100%   BMI 29.64 kg/m   Physical Exam Vitals and nursing note reviewed.  Constitutional:      Appearance: He is well-developed.     Comments: Pacing in the room  HENT:     Head: Normocephalic.  Cardiovascular:     Rate and Rhythm: Normal rate and regular rhythm.  Pulmonary:     Effort: Pulmonary effort is normal. No respiratory distress.     Breath sounds: Normal breath sounds.  Abdominal:     General: Bowel sounds are normal. There is no distension.     Palpations: Abdomen is soft.     Tenderness: There is abdominal tenderness in the epigastric area. There is no guarding or rebound.  Musculoskeletal:  General: Normal range of motion.     Cervical back: Normal range of motion and neck supple.  Skin:    General: Skin is warm and dry.  Neurological:     Mental Status: He is alert and oriented to person, place, and time.     ED Results / Procedures / Treatments   Labs (all labs ordered are listed, but only abnormal results are displayed) Labs Reviewed  BASIC METABOLIC PANEL - Abnormal; Notable for the following components:      Result Value   CO2 18 (*)    Glucose, Bld 279 (*)    All other components within normal limits  CBG MONITORING, ED - Abnormal; Notable for the following components:   Glucose-Capillary 252 (*)    All other components within normal limits   Results for orders placed or performed during the hospital encounter of 76/19/50  Basic metabolic panel  Result Value Ref Range   Sodium 135 135 - 145 mmol/L   Potassium 3.7 3.5 - 5.1 mmol/L   Chloride 102 98 - 111 mmol/L   CO2 18 (L) 22 - 32 mmol/L   Glucose, Bld 279 (H) 70 - 99 mg/dL   BUN 13 6 - 20 mg/dL   Creatinine, Ser 1.16 0.61 - 1.24 mg/dL   Calcium 9.7 8.9 - 10.3 mg/dL   GFR, Estimated >60 >60 mL/min   Anion gap 15 5 - 15  Blood gas, venous  Result Value Ref Range   pH, Ven 7.382 7.250 - 7.430   pCO2, Ven 41.0  (L) 44.0 - 60.0 mmHg   pO2, Ven 49.2 (H) 32.0 - 45.0 mmHg   Bicarbonate 23.8 20.0 - 28.0 mmol/L   Acid-base deficit 0.7 0.0 - 2.0 mmol/L   O2 Saturation 81.9 %   Patient temperature 98.6   CBG monitoring, ED  Result Value Ref Range   Glucose-Capillary 252 (H) 70 - 99 mg/dL    EKG None  Radiology No results found.  Procedures Procedures   Medications Ordered in ED Medications  haloperidol lactate (HALDOL) injection 5 mg (has no administration in time range)    ED Course  I have reviewed the triage vital signs and the nursing notes.  Pertinent labs & imaging results that were available during my care of the patient were reviewed by me and considered in my medical decision making (see chart for details).    MDM Rules/Calculators/A&P                          Patient to ED for the 6th ED visit in the last 7 days for same complaint. H/o gastroparesis, negative work up for abdominal pain otherwise. Has pending appointment with GI later today.   He frequently has uncontrolled vomiting, but reports no vomiting at present. BMET ordered without other labs as a full panel was done on earlier visit yesterday.  IM Haldol provided.   CO2 low at 18. Normal anion gap. Patient is a Type 1 DM. Will obtain VBG to insure no presence of DKA.  VBG confirms normal pH, no acidosis. On recheck, the patient is feeling much better. He is drinking PO fluids without difficulty. Stable for discharge.   Final Clinical Impression(s) / ED Diagnoses Final diagnoses:  None   1. Abdominal pain 2. History gastroparesis  Rx / DC Orders ED Discharge Orders    None       Dennie Bible 12/27/20 [id number]    Lucrezia Starch,  MD 12/28/20 0104

## 2020-12-27 NOTE — Discharge Instructions (Addendum)
Keep your scheduled appointment with gastroenterology later today.

## 2020-12-27 NOTE — Progress Notes (Signed)
Attending Physician's Attestation   I have reviewed the chart.   I agree with the Advanced Practitioner's note, impression, and recommendations with any updates as below.    Yoshiko Keleher Mansouraty, MD [facility] Advanced Endoscopy Office # [phone]  

## 2020-12-27 NOTE — Progress Notes (Signed)
Subjective:    Patient ID: Jonathan Porter, male    DOB: [birth year], 30 y.o.   MRN: [id number]  HPI Jonathan Porter is a 30 year old African-American male, known to myself from prior office visit on October 2020 and now established with Dr. Rush Landmark. Patient has history of insulin-dependent diabetes mellitus with polyneuropathy.  He was felt to have history of gastroparesis however gastric emptying scan in 2020 was normal. He last had CT in January 2021 done for nausea vomiting and abdominal pain this was unremarkable with the exception of a stable 5 mm right upper lobe opacity.  Patient has had numerous emergency room visits this year with complaints of epigastric pain nausea and vomiting.  He was in the emergency room on 12/23/2020, 12/25/2020 and again yesterday.  He was given Haldol for nausea and vomiting last evening and said that was helpful. He went to the emergency room yesterday because of recurrent nausea and vomiting and a glucose of 305.  He was ruled out for DKA and discharged home. Most recent labs done 12/26/2020 with normal CBC, normal LFTs, normal lipase, glucose 219 and no acidosis.  Patient says that he has continued to have these recurrent episodes of upper abdominal pain nausea and vomiting.  He says these episodes will last for a couple of weeks once they start and then gradually resolved.  He may go for months in between the episodes.  When he is sick he describes burning and cramping and aching in the upper abdomen most days associated with nausea and vomiting.  He says he goes to the emergency room when he cannot stop vomiting. On further discussion he has been using marijuana on a daily basis long-term.  No daily EtOH. No regular NSAIDs. He has a prescription for Bentyl which she says he has used periodically but does not feel like it helps. He has been given Zofran ODT which he says is not very effective.  He does not have a current prescription for other antiemetic or  prokinetic. At the time of his last office visit EGD was suggested but not followed through with.      Review of Systems Pertinent positive and negative review of systems were noted in the above HPI section.  All other review of systems was otherwise negative.  Outpatient Encounter Medications as of 12/27/2020  Medication Sig  . Accu-Chek FastClix Lancets MISC Use to check blood sugar up to 3 times daily.  . blood glucose meter kit and supplies KIT Dispense based on patient and insurance preference. Use up to four times daily as directed. (FOR ICD-9 250.00, 250.01).  . Blood Glucose Monitoring Suppl (ACCU-CHEK GUIDE ME) w/Device KIT 1 kit by Does not apply route 3 (three) times daily. Use to check blood sugar up to 3 times daily.  Marland Kitchen dicyclomine (BENTYL) 20 MG tablet Take 1 tablet (20 mg total) by mouth every 8 (eight) hours as needed for spasms.  Marland Kitchen glucose blood test strip USE AS DIRECTED UP TO FOUR TIMES DAILY (Patient taking differently: USE AS DIRECTED UP TO FOUR TIMES DAILY)  . insulin aspart (NOVOLOG) 100 UNIT/ML FlexPen INJECT 15 UNITS INTO THE SKIN 3 (THREE) TIMES DAILY WITH MEALS.  Marland Kitchen insulin glargine (LANTUS) 100 UNIT/ML Solostar Pen INJECT 15 UNITS INTO THE SKIN DAILY. (Patient taking differently: Inject 15 Units into the skin at bedtime.)  . omeprazole (PRILOSEC) 40 MG capsule Take 1 capsule (40 mg total) by mouth daily with breakfast.  . promethazine (PHENERGAN) 25 MG suppository Place 1  suppository (25 mg total) rectally every 8 (eight) hours as needed for nausea or vomiting.  . sildenafil (VIAGRA) 50 MG tablet TAKE ONE TABLET BY MOUTH 30 MINUTES PRIOR TO SEXUAL INTERCOURSE AS NEEDED. LIMIT 1 IN 24 HOURS (Patient taking differently: Take 50 mg by mouth as needed for erectile dysfunction (30 minutes prior to sexual intercourse as needed. limit 1 in 24hrs).)  . TRUEPLUS 5-BEVEL PEN NEEDLES 31G X 8 MM MISC USE AS DIRECTED UP TO FOUR TIMES DAILY  . [DISCONTINUED] metoCLOPramide (REGLAN)  10 MG tablet Take 1 tablet (10 mg total) by mouth every 8 (eight) hours as needed for nausea.  . metoCLOPramide (REGLAN) 10 MG tablet Take 1 tablet 1/2-hour before meals  . [DISCONTINUED] glucose blood test strip USE AS DIRECTED UP TO FOUR TIMES DAILY (Patient taking differently: USE AS DIRECTED UP TO FOUR TIMES DAILY)  . [DISCONTINUED] insulin aspart (NOVOLOG) 100 UNIT/ML FlexPen INJECT 15 UNITS INTO THE SKIN 3 (THREE) TIMES DAILY WITH MEALS. (Patient taking differently: Inject 15 Units into the skin 3 (three) times daily with meals.)  . [DISCONTINUED] ondansetron (ZOFRAN ODT) 4 MG disintegrating tablet Take 1 tablet (4 mg total) by mouth every 8 (eight) hours as needed for nausea or vomiting. (Patient not taking: No sig reported)  . [DISCONTINUED] ondansetron (ZOFRAN) 4 MG tablet Take 1 tablet (4 mg total) by mouth every 6 (six) hours. (Patient not taking: Reported on 12/27/2020)  . [DISCONTINUED] PROMETHAZINE HCL PO Take 1 tablet by mouth every 6 (six) hours as needed (nausea/vomitting).  . [DISCONTINUED] sucralfate (CARAFATE) 1 g tablet Take 1 tablet (1 g total) by mouth 4 (four) times daily for 14 days. (Patient not taking: Reported on 02/23/2020)   No facility-administered encounter medications on file as of 12/27/2020.   Allergies  Allergen Reactions  . Shellfish Allergy Anaphylaxis   Patient Active Problem List   Diagnosis Date Noted  . Marijuana abuse 12/17/2020  . Intractable nausea and vomiting 07/03/2019  . Gastroparesis 07/02/2019  . Influenza vaccine refused 06/03/2019  . Erectile dysfunction 06/03/2019  . Obesity (BMI 30-39.9) 06/03/2019  . Type 1 diabetes mellitus with diabetic polyneuropathy ([place]) 01/21/2019  . Type 1 diabetes mellitus with hyperglycemia ([place]) 01/21/2019  . Paronychia of finger of left hand 01/20/2017  . 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 Diabetes type 1, uncontrolled ([place]) 08/23/2015  . Onychomycosis of toenail 08/23/2015  .  Phimosis 08/07/2014   Social History   Socioeconomic History  . Marital status: Single    Spouse name: Not on file  . Number of children: Not on file  . Years of education: Not on file  . Highest education level: Not on file  Occupational History  . Not on file  Tobacco Use  . Smoking status: Current Every Day Smoker    Packs/day: 0.50    Years: 6.00    Pack years: 3.00    Types: Cigarettes  . Smokeless tobacco: Never Used  Vaping Use  . Vaping Use: Never used  Substance and Sexual Activity  . Alcohol use: Yes    Comment: social   . Drug use: Yes    Types: Marijuana  . Sexual activity: Yes    Birth control/protection: Condom    Comment: stated used condom 80% of the time   Other Topics Concern  . Not on file  Social History Narrative  . Not on file   Social Determinants of Health   Financial Resource Strain: Not on file  Food Insecurity:  Not on file  Transportation Needs: Not on file  Physical Activity: Not on file  Stress: Not on file  Social Connections: Not on file  Intimate Partner Violence: Not on file    Mr. Ewart family history includes Diabetes in his maternal aunt, maternal grandfather, and maternal grandmother; Thyroid disease in his mother and another family member.      Objective:    Vitals:   12/27/20 1311  BP: 110/76  Pulse: 92    Physical Exam Well-developed well-nourished African-American male in no acute distress.  Accompanied by his fiance height, Weight, 226 BMI 32.4  HEENT; nontraumatic normocephalic, EOMI, PE R LA, sclera anicteric. Oropharynx; not examined today Neck; supple, no JVD Cardiovascular; regular rate and rhythm with S1-S2, no murmur rub or gallop Pulmonary; Clear bilaterally Abdomen; soft, nontender, nondistended, no palpable mass or hepatosplenomegaly, bowel sounds are active.  No succussion splash Rectal; not done today Skin; benign exam, no jaundice rash or appreciable lesions Extremities; no clubbing cyanosis  or edema skin warm and dry Neuro/Psych; alert and oriented x4, grossly nonfocal mood and affect appropriate       Assessment & Plan:   #66 30 year old African-American male with insulin-dependent diabetes with cyclic abdominal pain nausea and vomiting. Etiology is not entirely clear, have suspected that he has diabetic gastroparesis however gastric emptying scan in 2020 was normal. 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 He may also have periodic exacerbations of gastroparesis despite negative gastric emptying scan. Rule out chronic gastropathy, peptic ulcer disease  Plan; patient will be scheduled for upper endoscopy with Dr. Stefani Dama Roddy.  Procedure was discussed in detail with the patient including indications risks and benefits and he is agreeable to proceed. Patient very strongly advised to discontinue cannabis use, and he voices understanding. We discussed using a gastroparesis diet as a guide, when he is sick to start with clear liquids and then gradually advance himself to a step 3 gastroparesis diet for maintenance.  He was given a copy. Stop dicyclomine. We will send prescription for Phenergan suppositories 25 mg every 6 to 8 hours as needed for active nausea and vomiting. Start metoclopramide 10 mg p.o. 30 minutes AC meals Start omeprazole 40 mg 1 p.o. every morning AC breakfast Patient was encouraged to try to avoid emergency room visits, and start with Phenergan suppositories at onset of symptoms.  Of course if symptoms are persistent persisting over 24 hours given his insulin dependence he will need to be evaluated.   Genia Harold PA-C 12/27/2020   Cc: Ladell Pier, MD

## 2020-12-28 ENCOUNTER — Emergency Department (HOSPITAL_COMMUNITY)
Admission: EM | Admit: 2020-12-28 | Discharge: 2020-12-28 | Disposition: A | Discharge status: Home / Self Care | Payer: Medicaid Other | Attending: Emergency Medicine | Admitting: Emergency Medicine

## 2020-12-28 ENCOUNTER — Other Ambulatory Visit: Payer: Self-pay

## 2020-12-28 DIAGNOSIS — R112 Nausea with vomiting, unspecified: Secondary | ICD-10-CM | POA: Insufficient documentation

## 2020-12-28 DIAGNOSIS — Z794 Long term (current) use of insulin: Secondary | ICD-10-CM | POA: Diagnosis not present

## 2020-12-28 DIAGNOSIS — R1084 Generalized abdominal pain: Secondary | ICD-10-CM | POA: Diagnosis present

## 2020-12-28 DIAGNOSIS — R61 Generalized hyperhidrosis: Secondary | ICD-10-CM | POA: Insufficient documentation

## 2020-12-28 DIAGNOSIS — F17210 Nicotine dependence, cigarettes, uncomplicated: Secondary | ICD-10-CM | POA: Diagnosis not present

## 2020-12-28 DIAGNOSIS — E1043 Type 1 diabetes mellitus with diabetic autonomic (poly)neuropathy: Secondary | ICD-10-CM | POA: Insufficient documentation

## 2020-12-28 MED ORDER — MORPHINE SULFATE (PF) 4 MG/ML IV SOLN
8.0000 mg | Freq: Once | INTRAVENOUS | Status: AC
  Administered 2020-12-28: 8 mg via INTRAMUSCULAR
  Filled 2020-12-28: qty 2

## 2020-12-28 MED ORDER — MORPHINE SULFATE (PF) 4 MG/ML IV SOLN
4.0000 mg | Freq: Once | INTRAVENOUS | Status: AC
  Administered 2020-12-28: 4 mg via INTRAVENOUS
  Filled 2020-12-28: qty 1

## 2020-12-28 MED ORDER — SODIUM CHLORIDE 0.9 % IV BOLUS
1000.0000 mL | Freq: Once | INTRAVENOUS | Status: AC
  Administered 2020-12-28: 1000 mL via INTRAVENOUS

## 2020-12-28 MED ORDER — ONDANSETRON HCL 4 MG/2ML IJ SOLN
4.0000 mg | Freq: Once | INTRAMUSCULAR | Status: AC
  Administered 2020-12-28: 4 mg via INTRAVENOUS
  Filled 2020-12-28: qty 2

## 2020-12-28 NOTE — Discharge Instructions (Addendum)
As discussed, your evaluation today has been largely reassuring.  But, it is important that you monitor your condition carefully, and do not hesitate to return to the ED if you develop new, or concerning changes in your condition.  Please be sure to start taking your medication as prescribed by our gastroenterology colleagues.  Follow-up with their team for endoscopy as discussed.

## 2020-12-28 NOTE — ED Triage Notes (Signed)
Pt came from home via EMS. C/c bilateral lower abdominal pain. PMH of gatroparesis, been seen for this issue. Pt states the pain has move from upper to lower abdomen. CBG: 299

## 2020-12-28 NOTE — ED Provider Notes (Signed)
[facility] DEPT Provider Note   CSN: [id number] Arrival date & time: 12/28/20  1233     History Chief Complaint  Patient presents with  . Abdominal Pain    KEIDEN DESKIN is a 30 y.o. male.  HPI Patient presents for the 14th time in 6 months, third time in the past week, now with concern for abdominal pain.  Pain is diffuse, and lower than it has been, but otherwise similar to that he is experienced on multiple prior presentations. I evaluated this patient earlier in the week.  At that point the patient had resolution of symptoms, followed up with GI.  He notes that he had that visit yesterday, was prescribed a new medication, scheduled for endoscopy.  He has not been able to obtain or take his new medication yet.  He notes that there is no obvious precipitant, but today, he has had severe substantial pain in the lower abdomen, diffusely.  There is associated nausea, vomiting. No ability to tolerate oral intake for pain relief.    Past Medical History:  Diagnosis Date  . Diabetes mellitus without complication ([facility])   . Gastroparesis     Patient Active Problem List   Diagnosis Date Noted  . Marijuana abuse 12/17/2020  . Intractable nausea and vomiting 07/03/2019  . Gastroparesis 07/02/2019  . Influenza vaccine refused 06/03/2019  . Erectile dysfunction 06/03/2019  . Obesity (BMI 30-39.9) 06/03/2019  . Type 1 diabetes mellitus with diabetic polyneuropathy ([place]) 01/21/2019  . Type 1 diabetes mellitus with hyperglycemia ([place]) 01/21/2019  . Paronychia of finger of left hand 01/20/2017  . 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 Diabetes type 1, uncontrolled ([place]) 08/23/2015  . Onychomycosis of toenail 08/23/2015  . Phimosis 08/07/2014    Past Surgical History:  Procedure Laterality Date  . NO PAST SURGERIES         Family History  Problem Relation Age of Onset  . Thyroid disease Mother   . Diabetes Maternal Aunt    . Diabetes Maternal Grandmother   . Diabetes Maternal Grandfather   . Thyroid disease Other     Social History   Tobacco Use  . Smoking status: Current Every Day Smoker    Packs/day: 0.50    Years: 6.00    Pack years: 3.00    Types: Cigarettes  . Smokeless tobacco: Never Used  Vaping Use  . Vaping Use: Never used  Substance Use Topics  . Alcohol use: Yes    Comment: social   . Drug use: Yes    Types: Marijuana    Home Medications Prior to Admission medications   Medication Sig Start Date End Date Taking? Authorizing Provider  dicyclomine (BENTYL) 20 MG tablet Take 1 tablet (20 mg total) by mouth every 8 (eight) hours as needed for spasms. 12/18/20  Yes Pokhrel, Laxman, MD  insulin aspart (NOVOLOG) 100 UNIT/ML FlexPen INJECT 15 UNITS INTO THE SKIN 3 (THREE) TIMES DAILY WITH MEALS. Patient taking differently: Inject 15 Units into the skin 3 (three) times daily with meals. 10/16/20 10/16/21 Yes Ladell Pier, MD  insulin glargine (LANTUS) 100 UNIT/ML Solostar Pen INJECT 15 UNITS INTO THE SKIN DAILY. Patient taking differently: Inject 15 Units into the skin at bedtime. 10/16/20 10/16/21 Yes Ladell Pier, MD  promethazine (PHENERGAN) 25 MG suppository Place 1 suppository (25 mg total) rectally every 8 (eight) hours as needed for nausea or vomiting. 12/27/20  Yes Esterwood, Amy S, PA-C  sildenafil (VIAGRA) 50 MG  tablet TAKE ONE TABLET BY MOUTH 30 MINUTES PRIOR TO SEXUAL INTERCOURSE AS NEEDED. LIMIT 1 IN 24 HOURS Patient taking differently: Take 50 mg by mouth as needed for erectile dysfunction (30 minutes prior to sexual intercourse as needed. limit 1 in 24hrs). 10/16/20  Yes Ladell Pier, MD  Accu-Chek FastClix Lancets MISC Use to check blood sugar up to 3 times daily. 06/03/19   Ladell Pier, MD  blood glucose meter kit and supplies KIT Dispense based on patient and insurance preference. Use up to four times daily as directed. (FOR ICD-9 250.00, 250.01). 12/01/19    Argentina Donovan, PA-C  Blood Glucose Monitoring Suppl (ACCU-CHEK GUIDE ME) w/Device KIT 1 kit by Does not apply route 3 (three) times daily. Use to check blood sugar up to 3 times daily. 06/03/19   Ladell Pier, MD  glucose blood test strip USE AS DIRECTED UP TO FOUR TIMES DAILY Patient taking differently: USE AS DIRECTED UP TO FOUR TIMES DAILY 09/28/20 09/28/21  Ladell Pier, MD  metoCLOPramide (REGLAN) 10 MG tablet Take 1 tablet 1/2-hour before meals Patient not taking: Reported on 12/28/2020 12/27/20   Esterwood, Amy S, PA-C  omeprazole (PRILOSEC) 40 MG capsule Take 1 capsule (40 mg total) by mouth daily with breakfast. Patient not taking: Reported on 12/28/2020 12/27/20   Esterwood, Amy S, PA-C  TRUEPLUS 5-BEVEL PEN NEEDLES 31G X 8 MM MISC USE AS DIRECTED UP TO FOUR TIMES DAILY 01/05/20   Ladell Pier, MD  sucralfate (CARAFATE) 1 g tablet Take 1 tablet (1 g total) by mouth 4 (four) times daily for 14 days. Patient not taking: Reported on 02/23/2020 10/01/19 02/23/20  McDonald, Mia A, PA-C    Allergies    Shellfish allergy  Review of Systems   Review of Systems  Constitutional:       Per HPI, otherwise negative  HENT:       Per HPI, otherwise negative  Respiratory:       Per HPI, otherwise negative  Cardiovascular:       Per HPI, otherwise negative  Gastrointestinal: Negative for vomiting.  Endocrine:       Negative aside from HPI  Genitourinary:       Neg aside from HPI   Musculoskeletal:       Per HPI, otherwise negative  Skin: Negative.   Neurological: Negative for syncope.    Physical Exam Updated Vital Signs BP (!) 160/108 (BP Location: Right Arm)   Pulse 76   Temp 98 F (36.7 C) (Oral)   Resp 13   Ht 5' 10"  (1.778 m)   Wt 101.2 kg   SpO2 100%   BMI 32.00 kg/m   Physical Exam Vitals and nursing note reviewed.  Constitutional:      Appearance: He is diaphoretic.     Comments: Uncomfortable appearing adult male walking about the room, stating he cannot get  comfortable.  HENT:     Head: Normocephalic and atraumatic.  Eyes:     Conjunctiva/sclera: Conjunctivae normal.  Cardiovascular:     Rate and Rhythm: Normal rate and regular rhythm.  Pulmonary:     Effort: Pulmonary effort is normal. No respiratory distress.     Breath sounds: No stridor.  Abdominal:     General: There is no distension.     Tenderness: There is generalized abdominal tenderness.  Skin:    General: Skin is warm.  Neurological:     Mental Status: He is alert and oriented to person, place,  and time.  Psychiatric:     Comments: Anxious     ED Results / Procedures / Treatments    Procedures Procedures   Medications Ordered in ED Medications  morphine 4 MG/ML injection 8 mg (8 mg Intramuscular Given 12/28/20 1347)  sodium chloride 0.9 % bolus 1,000 mL (1,000 mLs Intravenous New Bag/Given 12/28/20 1458)  ondansetron (ZOFRAN) injection 4 mg (4 mg Intravenous Given 12/28/20 1458)  morphine 4 MG/ML injection 4 mg (4 mg Intravenous Given 12/28/20 1455)    ED Course  I have reviewed the triage vital signs and the nursing notes.  Pertinent labs & imaging results that were available during my care of the patient were reviewed by me and considered in my medical decision making (see chart for details).    3:21 PM Patient markedly better, resting, no ongoing vomiting, states that his symptoms have resolved. He and I discussed importance of initiating therapy provided by his gastroenterologist yesterday. Adult male with history of intractable nausea, vomiting, abdominal pain now presents with a similar episode.  Patient's vital signs are unremarkable, abdomen nonperitoneal, but with substantial pain that required fluids, antiemetics, analgesia.  Some concern for his repeat presentations, some of which with narcotics provided for pain control with concern for refractory pain.  However, the patient has just initiated care with gastroenterology, will start new medication with them.   Here, with resolution of symptoms, no evidence of peritonitis, no fever, no ongoing vomiting, the patient is discharged in stable condition.  Final Clinical Impression(s) / ED Diagnoses Final diagnoses:  Generalized abdominal pain     Carmin Muskrat, MD 12/28/20 1523

## 2020-12-30 ENCOUNTER — Other Ambulatory Visit: Payer: Self-pay

## 2020-12-30 ENCOUNTER — Emergency Department (HOSPITAL_COMMUNITY)
Admission: EM | Admit: 2020-12-30 | Discharge: 2020-12-30 | Disposition: A | Discharge status: Home / Self Care | Payer: Medicaid Other | Attending: Emergency Medicine | Admitting: Emergency Medicine

## 2020-12-30 ENCOUNTER — Encounter (HOSPITAL_COMMUNITY): Payer: Self-pay | Admitting: Emergency Medicine

## 2020-12-30 DIAGNOSIS — R1084 Generalized abdominal pain: Secondary | ICD-10-CM | POA: Insufficient documentation

## 2020-12-30 DIAGNOSIS — E1042 Type 1 diabetes mellitus with diabetic polyneuropathy: Secondary | ICD-10-CM | POA: Diagnosis not present

## 2020-12-30 DIAGNOSIS — F17210 Nicotine dependence, cigarettes, uncomplicated: Secondary | ICD-10-CM | POA: Diagnosis not present

## 2020-12-30 DIAGNOSIS — R109 Unspecified abdominal pain: Secondary | ICD-10-CM

## 2020-12-30 LAB — COMPREHENSIVE METABOLIC PANEL
ALT: 15 U/L (ref 0–44)
AST: 18 U/L (ref 15–41)
Albumin: 4.4 g/dL (ref 3.5–5.0)
Alkaline Phosphatase: 94 U/L (ref 38–126)
Anion gap: 14 (ref 5–15)
BUN: 14 mg/dL (ref 6–20)
CO2: 23 mmol/L (ref 22–32)
Calcium: 9.5 mg/dL (ref 8.9–10.3)
Chloride: 99 mmol/L (ref 98–111)
Creatinine, Ser: 1.14 mg/dL (ref 0.61–1.24)
GFR, Estimated: >60 mL/min (ref >60)
Glucose, Bld: 245 mg/dL — ABNORMAL HIGH (ref 70–99)
Potassium: 4 mmol/L (ref 3.5–5.1)
Sodium: 136 mmol/L (ref 135–145)
Total Bilirubin: 1.1 mg/dL (ref 0.3–1.2)
Total Protein: 7.9 g/dL (ref 6.5–8.1)

## 2020-12-30 LAB — LIPASE, BLOOD: Lipase: 23 U/L (ref 11–51)

## 2020-12-30 LAB — CBC
HCT: 45.8 % (ref 39.0–52.0)
Hemoglobin: 15.4 g/dL (ref 13.0–17.0)
MCH: 27.8 pg (ref 26.0–34.0)
MCHC: 33.6 g/dL (ref 30.0–36.0)
MCV: 82.7 fL (ref 80.0–100.0)
Platelets: 348 10*3/uL (ref 150–400)
RBC: 5.54 MIL/uL (ref 4.22–5.81)
RDW: 13.7 % (ref 11.5–15.5)
WBC: 13 10*3/uL — ABNORMAL HIGH (ref 4.0–10.5)
nRBC: 0 % (ref 0.0–0.2)

## 2020-12-30 MED ORDER — HALOPERIDOL LACTATE 5 MG/ML IJ SOLN
5.0000 mg | Freq: Once | INTRAMUSCULAR | Status: AC
  Administered 2020-12-30: 5 mg via INTRAMUSCULAR
  Filled 2020-12-30: qty 1

## 2020-12-30 MED ORDER — DROPERIDOL 2.5 MG/ML IJ SOLN
2.5000 mg | Freq: Once | INTRAMUSCULAR | Status: AC
  Administered 2020-12-30: 2.5 mg via INTRAVENOUS
  Filled 2020-12-30: qty 2

## 2020-12-30 MED ORDER — DICYCLOMINE HCL 10 MG PO CAPS
10.0000 mg | ORAL_CAPSULE | Freq: Once | ORAL | Status: AC
  Administered 2020-12-30: 10 mg via ORAL
  Filled 2020-12-30: qty 1

## 2020-12-30 MED ORDER — ACETAMINOPHEN 500 MG PO TABS
1000.0000 mg | ORAL_TABLET | Freq: Once | ORAL | Status: AC
  Administered 2020-12-30: 1000 mg via ORAL
  Filled 2020-12-30: qty 2

## 2020-12-30 NOTE — ED Triage Notes (Signed)
Patient complaining of abdominal pain since 2330. Patient states that he has taken his medication and it is not working.

## 2020-12-30 NOTE — ED Notes (Signed)
Pt refusing vitals at this time. Pt states "I am unable to sit still"

## 2020-12-30 NOTE — ED Notes (Signed)
Patient continually calling out and stepping out of room stating he is in pain. Patient again informed that he has been given medicine and it needs time to work.

## 2020-12-30 NOTE — ED Notes (Signed)
Patient out of room stating medication is not working. Informed patient that the medicine needs time to take affect. Patient verbalized understanding.

## 2020-12-30 NOTE — ED Provider Notes (Signed)
[facility] DEPT Provider Note   CSN: [id number] Arrival date & time: 12/30/20  0093     History Chief Complaint  Patient presents with  . Abdominal Pain    Jonathan Porter is a 30 y.o. male.   Abdominal Pain Pain location:  Generalized Pain quality: aching   Pain radiates to:  Does not radiate Pain severity:  Mild Onset quality:  Gradual Duration:  2 weeks Timing:  Constant Progression:  Waxing and waning Chronicity:  Chronic Relieved by:  None tried      Past Medical History:  Diagnosis Date  . Diabetes mellitus without complication ([place] [facility])   . Gastroparesis     Patient Active Problem List   Diagnosis Date Noted  . Marijuana abuse 12/17/2020  . Intractable nausea and vomiting 07/03/2019  . Gastroparesis 07/02/2019  . Influenza vaccine refused 06/03/2019  . Erectile dysfunction 06/03/2019  . Obesity (BMI 30-39.9) 06/03/2019  . Type 1 diabetes mellitus with diabetic polyneuropathy ([employer]) 01/21/2019  . Type 1 diabetes mellitus with hyperglycemia ([place]) 01/21/2019  . Paronychia of finger of left hand 01/20/2017  . 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 Diabetes type 1, uncontrolled ([place]) 08/23/2015  . Onychomycosis of toenail 08/23/2015  . Phimosis 08/07/2014    Past Surgical History:  Procedure Laterality Date  . NO PAST SURGERIES         Family History  Problem Relation Age of Onset  . Thyroid disease Mother   . Diabetes Maternal Aunt   . Diabetes Maternal Grandmother   . Diabetes Maternal Grandfather   . Thyroid disease Other     Social History   Tobacco Use  . Smoking status: Current Every Day Smoker    Packs/day: 0.50    Years: 6.00    Pack years: 3.00    Types: Cigarettes  . Smokeless tobacco: Never Used  Vaping Use  . Vaping Use: Never used  Substance Use Topics  . Alcohol use: Yes    Comment: social   . Drug use: Yes    Types: Marijuana    Home Medications Prior to  Admission medications   Medication Sig Start Date End Date Taking? Authorizing Provider  Accu-Chek FastClix Lancets MISC Use to check blood sugar up to 3 times daily. 06/03/19   Ladell Pier, MD  blood glucose meter kit and supplies KIT Dispense based on patient and insurance preference. Use up to four times daily as directed. (FOR ICD-9 250.00, 250.01). 12/01/19   Argentina Donovan, PA-C  Blood Glucose Monitoring Suppl (ACCU-CHEK GUIDE ME) w/Device KIT 1 kit by Does not apply route 3 (three) times daily. Use to check blood sugar up to 3 times daily. 06/03/19   Ladell Pier, MD  dicyclomine (BENTYL) 20 MG tablet Take 1 tablet (20 mg total) by mouth every 8 (eight) hours as needed for spasms. 12/18/20   Pokhrel, Laxman, MD  glucose blood test strip USE AS DIRECTED UP TO FOUR TIMES DAILY Patient taking differently: USE AS DIRECTED UP TO FOUR TIMES DAILY 09/28/20 09/28/21  Ladell Pier, MD  insulin aspart (NOVOLOG) 100 UNIT/ML FlexPen INJECT 15 UNITS INTO THE SKIN 3 (THREE) TIMES DAILY WITH MEALS. Patient taking differently: Inject 15 Units into the skin 3 (three) times daily with meals. 10/16/20 10/16/21  Ladell Pier, MD  insulin glargine (LANTUS) 100 UNIT/ML Solostar Pen INJECT 15 UNITS INTO THE SKIN DAILY. Patient taking differently: Inject 15 Units into the skin at bedtime. 10/16/20 10/16/21  Ladell Pier, MD  metoCLOPramide (REGLAN) 10 MG tablet Take 1 tablet 1/2-hour before meals Patient not taking: Reported on 12/28/2020 12/27/20   Esterwood, Amy S, PA-C  omeprazole (PRILOSEC) 40 MG capsule Take 1 capsule (40 mg total) by mouth daily with breakfast. Patient not taking: Reported on 12/28/2020 12/27/20   Esterwood, Amy S, PA-C  promethazine (PHENERGAN) 25 MG suppository Place 1 suppository (25 mg total) rectally every 8 (eight) hours as needed for nausea or vomiting. 12/27/20   Esterwood, Amy S, PA-C  sildenafil (VIAGRA) 50 MG tablet TAKE ONE TABLET BY MOUTH 30 MINUTES PRIOR TO SEXUAL  INTERCOURSE AS NEEDED. LIMIT 1 IN 24 HOURS Patient taking differently: Take 50 mg by mouth as needed for erectile dysfunction (30 minutes prior to sexual intercourse as needed. limit 1 in 24hrs). 10/16/20   Ladell Pier, MD  TRUEPLUS 5-BEVEL PEN NEEDLES 31G X 8 MM MISC USE AS DIRECTED UP TO FOUR TIMES DAILY 01/05/20   Ladell Pier, MD  sucralfate (CARAFATE) 1 g tablet Take 1 tablet (1 g total) by mouth 4 (four) times daily for 14 days. Patient not taking: Reported on 02/23/2020 10/01/19 02/23/20  McDonald, Mia A, PA-C    Allergies    Shellfish allergy  Review of Systems   Review of Systems  Gastrointestinal: Positive for abdominal pain.  All other systems reviewed and are negative.   Physical Exam Updated Vital Signs BP 135/83   Pulse 88   Temp 98.5 F (36.9 C) (Oral)   Resp 20   Ht _0  (1.778 m)   Wt 101.2 kg   SpO2 99%   BMI 32.00 kg/m   Physical Exam Vitals and nursing note reviewed.  Constitutional:      Appearance: He is well-developed.  HENT:     Head: Normocephalic and atraumatic.     Mouth/Throat:     Mouth: Mucous membranes are moist.     Pharynx: Oropharynx is clear.  Eyes:     Pupils: Pupils are equal, round, and reactive to light.  Cardiovascular:     Rate and Rhythm: Normal rate.  Pulmonary:     Effort: Pulmonary effort is normal. No respiratory distress.  Abdominal:     General: Bowel sounds are normal. There is no distension.     Tenderness: There is no abdominal tenderness.  Musculoskeletal:        General: Normal range of motion.     Cervical back: Normal range of motion.  Skin:    General: Skin is warm and dry.  Neurological:     General: No focal deficit present.     Mental Status: He is alert.     Cranial Nerves: No cranial nerve deficit.     ED Results / Procedures / Treatments   Labs (all labs ordered are listed, but only abnormal results are displayed) Labs Reviewed  COMPREHENSIVE METABOLIC PANEL - Abnormal; Notable for the  following components:      Result Value   Glucose, Bld 245 (*)    All other components within normal limits  CBC - Abnormal; Notable for the following components:   WBC 13.0 (*)    All other components within normal limits  LIPASE, BLOOD  URINALYSIS, ROUTINE W REFLEX MICROSCOPIC    EKG None  Radiology No results found.  Procedures Procedures   Medications Ordered in ED Medications  droperidol (INAPSINE) 2.5 MG/ML injection 2.5 mg (2.5 mg Intravenous Given 12/30/20 0314)  acetaminophen (TYLENOL) tablet 1,000 mg (1,000 mg Oral  Given 12/30/20 0403)  dicyclomine (BENTYL) capsule 10 mg (10 mg Oral Given 12/30/20 0403)  haloperidol lactate (HALDOL) injection 5 mg (5 mg Intramuscular Given 12/30/20 0606)    ED Course  I have reviewed the triage vital signs and the nursing notes.  Pertinent labs & imaging results that were available during my care of the patient were reviewed by me and considered in my medical decision making (see chart for details).    MDM Rules/Calculators/A&P                          Pain improved with haldol.   Final Clinical Impression(s) / ED Diagnoses Final diagnoses:  Abdominal pain, unspecified abdominal location    Rx / DC Orders ED Discharge Orders    None       Lovell Nuttall, Corene Cornea, MD 12/30/20 [phone]

## 2020-12-30 NOTE — ED Notes (Signed)
Patient out of room again complaining of pain and would like to speak with the doctor. MD made aware. No new orders at this time.

## 2021-01-01 ENCOUNTER — Encounter: Payer: Self-pay | Admitting: Internal Medicine

## 2021-01-01 ENCOUNTER — Other Ambulatory Visit: Payer: Self-pay

## 2021-01-01 ENCOUNTER — Ambulatory Visit: Payer: Medicaid Other | Attending: Internal Medicine | Admitting: Internal Medicine

## 2021-01-01 VITALS — BP 122/84 | HR 95 | Resp 16 | Wt 228.4 lb

## 2021-01-01 DIAGNOSIS — F321 Major depressive disorder, single episode, moderate: Secondary | ICD-10-CM | POA: Diagnosis not present

## 2021-01-01 DIAGNOSIS — F17200 Nicotine dependence, unspecified, uncomplicated: Secondary | ICD-10-CM

## 2021-01-01 DIAGNOSIS — Z794 Long term (current) use of insulin: Secondary | ICD-10-CM | POA: Diagnosis not present

## 2021-01-01 DIAGNOSIS — Z79899 Other long term (current) drug therapy: Secondary | ICD-10-CM | POA: Diagnosis not present

## 2021-01-01 DIAGNOSIS — R1110 Vomiting, unspecified: Secondary | ICD-10-CM | POA: Insufficient documentation

## 2021-01-01 DIAGNOSIS — R109 Unspecified abdominal pain: Secondary | ICD-10-CM | POA: Diagnosis not present

## 2021-01-01 DIAGNOSIS — N529 Male erectile dysfunction, unspecified: Secondary | ICD-10-CM | POA: Diagnosis not present

## 2021-01-01 DIAGNOSIS — K3184 Gastroparesis: Secondary | ICD-10-CM | POA: Diagnosis not present

## 2021-01-01 DIAGNOSIS — K30 Functional dyspepsia: Secondary | ICD-10-CM | POA: Diagnosis not present

## 2021-01-01 DIAGNOSIS — F329 Major depressive disorder, single episode, unspecified: Secondary | ICD-10-CM | POA: Diagnosis not present

## 2021-01-01 DIAGNOSIS — F17210 Nicotine dependence, cigarettes, uncomplicated: Secondary | ICD-10-CM | POA: Insufficient documentation

## 2021-01-01 DIAGNOSIS — Z833 Family history of diabetes mellitus: Secondary | ICD-10-CM | POA: Insufficient documentation

## 2021-01-01 DIAGNOSIS — F1210 Cannabis abuse, uncomplicated: Secondary | ICD-10-CM | POA: Diagnosis not present

## 2021-01-01 DIAGNOSIS — E1043 Type 1 diabetes mellitus with diabetic autonomic (poly)neuropathy: Secondary | ICD-10-CM | POA: Diagnosis not present

## 2021-01-01 DIAGNOSIS — E1065 Type 1 diabetes mellitus with hyperglycemia: Secondary | ICD-10-CM | POA: Insufficient documentation

## 2021-01-01 LAB — GLUCOSE, POCT (MANUAL RESULT ENTRY): POC Glucose: 216 mg/dl — AB (ref 70–99)

## 2021-01-01 MED ORDER — NICOTINE 21 MG/24HR TD PT24
21.0000 mg | MEDICATED_PATCH | Freq: Every day | TRANSDERMAL | 1 refills | Status: DC
Start: 2021-01-01 — End: 2021-02-21

## 2021-01-01 MED ORDER — SERTRALINE HCL 50 MG PO TABS: ORAL_TABLET | ORAL | 2 refills | Status: DC

## 2021-01-01 MED ORDER — SILDENAFIL CITRATE 50 MG PO TABS: ORAL_TABLET | ORAL | 4 refills | Status: DC

## 2021-01-01 NOTE — Progress Notes (Signed)
Patient ID: Jonathan Porter, male    DOB: 12/15/1990  MRN: [id number]  CC: Hospitalization Follow-up (ED)   Subjective: Jonathan Porter is a 30 y.o. male who presents for hosp f/u His concerns today include:  Patient with history of insulin-dependent diabetes, genital warts, tob dep, ED, marijuana use,  Patient presents as follow-up from the emergency room.   Seen in the ER about 12 times since 12/15/2020 with complaint of generalized abdominal pain with nausea and vomiting.  Symptoms thought to be due to gastroparesis.  Of note, he had negative gastric emptying study in 2020. -ER visit on 12/27/2020, he was seen by the PA for his gastroenterologist Dr. Rush Landmark.  Assessment was that etiology of his abdominal pain is not entirely clear but likely diabetic gastroparesis.  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  Placed on Phenergan suppositories to use as needed, Reglan 10 mg to take 30 minutes before meals, omeprazole.  Advised to discontinue use of marijuana.  Schedule for an EGD in June.  Today: Patient reports that he has been using the medications that were prescribed for him but he still gets the symptoms.  Reports flareup like this every 2 to 4 months that can last several days to weeks. -Pain can occur at any time and may or may not be associated with food.  Sometimes he is woken out of sleep.  He gets an aching in his stomach followed by a wave of nausea then burning and pulsating pain. -X-ray of the abdomen done last month revealed high stool burden in the colon.  Patient reports regular daily bowel movements and no blood in the stools. -He has not stopped smoking weed.  States it will be hard to do because he has been smoking daily since the age of 30.  He feels that marijuana helps calm down his symptoms at times.  He does not feel that the marijuana is contributing to his symptoms. -He also states that "they never give me anything for pain. '  States he is given  medication for nausea and other things but nothing for pain.  Last CT of the abdomen was 09/2019. -He has not had any major weight loss since I last saw him in January.  At that time he was 217 pounds.  Today he is 228 pounds.  DM: Lab Results  Component Value Date   HGBA1C 8.7 (H) 12/17/2020  Last A1c had improved from 12 to 8.7. Takes Lantus every day.  Not taking Novolog all the time because may not have meter with him even if he has pen with him Eating habits poor.  "I eat when I feel like I can."  Sometimes the smell of food makes him sick to his stomach.  Trying to do better to manage his diabetes but reports he is too busy at times.  He has 4 kids, animals and a live-in girlfriend.  States he has bills to pay. 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  Since then he has been working in [employer] as a Training and development officer but lost his job 2 to 3 weeks ago because he had missed too many days from work going to the emergency room.   -Tells me that he is applying for disability and that others told him he should have been on it a long time ago. -Has a letter with him from [employer] that he received a few days ago.  He states that he does not  understand what the letter saying and wanted me to take a look at it.  According to the letter he submitted an application electronically but it seems the application is incomplete and they are telling him to go back on line and finish and resubmit the application.  Tob dep:  1/2 pk or less/day at this time.  Wants to quit.  Getting to expensive.  Patches were helpful in the past and would not mind being placed back on them to help him quit.   Marland Kitchen  Pos Dep: He attributes his depression to "a hard life."  He states that he does not have his parents, he is raising his 4 children as a single father along with his girlfriend.  Reports being homeless at one time.  No suicidal ideation.  Requests refill on Viagra. Patient Active Problem List    Diagnosis Date Noted  . Marijuana abuse 12/17/2020  . Intractable nausea and vomiting 07/03/2019  . Gastroparesis 07/02/2019  . Influenza vaccine refused 06/03/2019  . Erectile dysfunction 06/03/2019  . Obesity (BMI 30-39.9) 06/03/2019  . Type 1 diabetes mellitus with diabetic polyneuropathy ([place]) 01/21/2019  . Type 1 diabetes mellitus with hyperglycemia ([place]) 01/21/2019  . Paronychia of finger of left hand 01/20/2017  . 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 Diabetes type 1, uncontrolled ([place]) 08/23/2015  . Onychomycosis of toenail 08/23/2015  . Phimosis 08/07/2014     Current Outpatient Medications on File Prior to Visit  Medication Sig Dispense Refill  . Accu-Chek FastClix Lancets MISC Use to check blood sugar up to 3 times daily. 102 each 11  . blood glucose meter kit and supplies KIT Dispense based on patient and insurance preference. Use up to four times daily as directed. (FOR ICD-9 250.00, 250.01). 1 each 0  . Blood Glucose Monitoring Suppl (ACCU-CHEK GUIDE ME) w/Device KIT 1 kit by Does not apply route 3 (three) times daily. Use to check blood sugar up to 3 times daily. 1 kit 0  . dicyclomine (BENTYL) 20 MG tablet Take 1 tablet (20 mg total) by mouth every 8 (eight) hours as needed for spasms. 30 tablet 0  . glucose blood test strip USE AS DIRECTED UP TO FOUR TIMES DAILY (Patient taking differently: USE AS DIRECTED UP TO FOUR TIMES DAILY) 100 strip 0  . insulin aspart (NOVOLOG) 100 UNIT/ML FlexPen INJECT 15 UNITS INTO THE SKIN 3 (THREE) TIMES DAILY WITH MEALS. (Patient taking differently: Inject 15 Units into the skin 3 (three) times daily with meals.) 15 mL 4  . insulin glargine (LANTUS) 100 UNIT/ML Solostar Pen INJECT 15 UNITS INTO THE SKIN DAILY. (Patient taking differently: Inject 15 Units into the skin at bedtime.) 15 mL 5  . metoCLOPramide (REGLAN) 10 MG tablet Take 1 tablet 1/2-hour before meals (Patient not taking: Reported on 12/28/2020) 90 tablet 6  .  omeprazole (PRILOSEC) 40 MG capsule Take 1 capsule (40 mg total) by mouth daily with breakfast. (Patient not taking: Reported on 12/28/2020) 30 capsule 8  . promethazine (PHENERGAN) 25 MG suppository Place 1 suppository (25 mg total) rectally every 8 (eight) hours as needed for nausea or vomiting. 12 each 2  . TRUEPLUS 5-BEVEL PEN NEEDLES 31G X 8 MM MISC USE AS DIRECTED UP TO FOUR TIMES DAILY 100 each 11  . [DISCONTINUED] sucralfate (CARAFATE) 1 g tablet Take 1 tablet (1 g total) by mouth 4 (four) times daily for 14 days. (Patient not taking: Reported on 02/23/2020) 56 tablet 0   No current  facility-administered medications on file prior to visit.    Allergies  Allergen Reactions  . Shellfish Allergy Anaphylaxis    Social History   Socioeconomic History  . Marital status: Single    Spouse name: Not on file  . Number of children: Not on file  . Years of education: Not on file  . Highest education level: Not on file  Occupational History  . Not on file  Tobacco Use  . Smoking status: Current Every Day Smoker    Packs/day: 0.50    Years: 6.00    Pack years: 3.00    Types: Cigarettes  . Smokeless tobacco: Never Used  Vaping Use  . Vaping Use: Never used  Substance and Sexual Activity  . Alcohol use: Yes    Comment: social   . Drug use: Yes    Types: Marijuana  . Sexual activity: Yes    Birth control/protection: Condom    Comment: stated used condom 80% of the time   Other Topics Concern  . Not on file  Social History Narrative  . Not on file   Social Determinants of Health   Financial Resource Strain: Not on file  Food Insecurity: Not on file  Transportation Needs: Not on file  Physical Activity: Not on file  Stress: Not on file  Social Connections: Not on file  Intimate Partner Violence: Not on file    Family History  Problem Relation Age of Onset  . Thyroid disease Mother   . Diabetes Maternal Aunt   . Diabetes Maternal Grandmother   . Diabetes Maternal  Grandfather   . Thyroid disease Other     Past Surgical History:  Procedure Laterality Date  . NO PAST SURGERIES      ROS: Review of Systems Negative except as stated above  PHYSICAL EXAM: BP 122/84   Pulse 95   Resp 16   Wt 228 lb 6.4 oz (103.6 kg)   SpO2 100%   BMI 32.77 kg/m   Wt Readings from Last 3 Encounters:  01/01/21 228 lb 6.4 oz (103.6 kg)  12/30/20 223 lb (101.2 kg)  12/28/20 223 lb (101.2 kg)    Physical Exam General appearance - alert, well appearing, and in no distress Mental status - normal mood, behavior, speech, dress, motor activity, and thought processes Neck - supple, no significant adenopathy Chest - clear to auscultation, no wheezes, rales or rhonchi, symmetric air entry Heart - normal rate, regular rhythm, normal S1, S2, no murmurs, rubs, clicks or gallops Abdomen - soft, nontender, nondistended, no masses or organomegaly Extremities - peripheral pulses normal, no pedal edema, no clubbing or cyanosis  Depression screen [facility] 2/9 01/01/2021 12/01/2019 06/03/2019  Decreased Interest 2 0 0  Down, Depressed, Hopeless 2 0 2  PHQ - 2 Score 4 0 2  Altered sleeping 3 0 3  Tired, decreased energy 3 0 1  Change in appetite 3 0 3  Feeling bad or failure about yourself  1 0 1  Trouble concentrating 3 0 0  Moving slowly or fidgety/restless 3 0 0  Suicidal thoughts 0 0 0  PHQ-9 Score 20 0 10    CMP Latest Ref Rng & Units 12/30/2020 12/26/2020 12/26/2020  Glucose 70 - 99 mg/dL 245(H) 279(H) 219(H)  BUN 6 - 20 mg/dL 14 13 11   Creatinine 0.61 - 1.24 mg/dL 1.14 1.16 1.19  Sodium 135 - 145 mmol/L 136 135 137  Potassium 3.5 - 5.1 mmol/L 4.0 3.7 3.7  Chloride 98 - 111 mmol/L 99  102 103  CO2 22 - 32 mmol/L 23 18(L) 23  Calcium 8.9 - 10.3 mg/dL 9.5 9.7 9.7  Total Protein 6.5 - 8.1 g/dL 7.9 - 8.6(H)  Total Bilirubin 0.3 - 1.2 mg/dL 1.1 - 1.0  Alkaline Phos 38 - 126 U/L 94 - 101  AST 15 - 41 U/L 18 - 18  ALT 0 - 44 U/L 15 - 14   Lipid Panel     Component Value  Date/Time   CHOL (H) 08/31/2007 0440    339        ATP III CLASSIFICATION:  <200     mg/dL   Desirable  200-239  mg/dL   Borderline High  >=240    mg/dL   High   TRIG 260 (H) 08/31/2007 0440   HDL 39 08/31/2007 0440   CHOLHDL 8.7 08/31/2007 0440   VLDL 52 (H) 08/31/2007 0440   LDLCALC (H) 08/31/2007 0440    248        Total Cholesterol/HDL:CHD Risk Coronary Heart Disease Risk Table                     Men   Women  1/2 Average Risk   3.4   3.3    CBC    Component Value Date/Time   WBC 13.0 (H) 12/30/2020 0241   RBC 5.54 12/30/2020 0241   HGB 15.4 12/30/2020 0241   HCT 45.8 12/30/2020 0241   PLT 348 12/30/2020 0241   MCV 82.7 12/30/2020 0241   MCH 27.8 12/30/2020 0241   MCHC 33.6 12/30/2020 0241   RDW 13.7 12/30/2020 0241   LYMPHSABS 3.2 12/26/2020 1030   MONOABS 0.5 12/26/2020 1030   EOSABS 0.4 12/26/2020 1030   BASOSABS 0.1 12/26/2020 1030   . Results for orders placed or performed in visit on 01/01/21  POCT glucose (manual entry)  Result Value Ref Range   POC Glucose 216 (A) 70 - 99 mg/dl    ASSESSMENT AND PLAN: 1. Type 1 diabetes mellitus with hyperglycemia (HCC) Not at goal but has improved. Encourage healthy eating habits smaller more frequent meals. Continue current dose of Lantus.  Encouraged him to use the NovoLog consistently. - POCT glucose (manual entry)  2. Abdominal pain, recurrent Differential include diabetic gastroparesis, marijuana induced hyperemesis, functional dyspepsia/abdominal pain, secondary gain to get disability or a combination of some of these. -He will continue the medications as prescribed by the gastroenterologist. -Strongly advised that he try to wean himself off of marijuana and try to stay off of it completely for a good 4 months to see whether the symptoms abate -He is agreeable to trying low-dose SSRI in the form of Zoloft to address any component of functional dyspepsia/abdominal pain  3. Major depressive disorder, single  episode, moderate ([place]) Discussed management of depression with cognitive behavioral therapy and/or medication.  Patient agreeable to trying Zoloft.  Denies any suicidal ideation at this time.  We will start him on a low-dose of 25 mg daily for a month then increase to a full tablet.  If any worsening depression patient should stop the medicine and follow-up.  We will see him back in about 6 to 7 weeks. - sertraline (ZOLOFT) 50 MG tablet; 1/2 tab PO daily x 1 mth then 1 tab daily  Dispense: 30 tablet; Refill: 2  4. Tobacco dependence Advised to quit.  Discussed health risks associated with smoking.  Patient wanting to quit.  He would like to try the nicotine patches again.  We  discussed the stepdown approach and will start at the 21 mg dose which she will use for 1 to 2 months then we will step him down to the 14 mg.  3 minutes spent on counseling.  Will reevaluate progress on his follow-up visit in 7 weeks. - nicotine (NICODERM CQ - DOSED IN MG/24 HOURS) 21 mg/24hr patch; Place 1 patch (21 mg total) onto the skin daily.  Dispense: 28 patch; Refill: 1  5. Erectile dysfunction, unspecified erectile dysfunction type - sildenafil (VIAGRA) 50 MG tablet; TAKE ONE TABLET BY MOUTH 30 MINUTES PRIOR TO SEXUAL INTERCOURSE AS NEEDED. LIMIT 1 IN 24 HOURS  Dispense: 10 tablet; Refill: 4     Patient was given the opportunity to ask questions.  Patient verbalized understanding of the plan and was able to repeat key elements of the plan.   Orders Placed This Encounter  Procedures  . POCT glucose (manual entry)     Requested Prescriptions   Signed Prescriptions Disp Refills  . nicotine (NICODERM CQ - DOSED IN MG/24 HOURS) 21 mg/24hr patch 28 patch 1    Sig: Place 1 patch (21 mg total) onto the skin daily.  . sildenafil (VIAGRA) 50 MG tablet 10 tablet 4    Sig: TAKE ONE TABLET BY MOUTH 30 MINUTES PRIOR TO SEXUAL INTERCOURSE AS NEEDED. LIMIT 1 IN 24 HOURS  . sertraline (ZOLOFT) 50 MG tablet 30 tablet 2     Sig: 1/2 tab PO daily x 1 mth then 1 tab daily    Return in about 7 weeks (around 02/19/2021).  Karle Plumber, MD, FACP

## 2021-01-01 NOTE — Patient Instructions (Signed)
Start Zoloft as discussed today to help with depression in the chronic recurrent abdominal pain.  Please try to wean yourself off of the daily marijuana use.  Try to stay off of it for good 4 months to see whether the gastrointestinal symptoms resolve.  Your A1c has improved.  Continue to work on compliance with taking your insulin.

## 2021-01-23 ENCOUNTER — Telehealth: Payer: Self-pay | Admitting: Internal Medicine

## 2021-01-24 NOTE — Telephone Encounter (Signed)
Spoke with Pt and made him aware. Per patient he has already requested his medical records which was printed and given to him on 4/12. Patient will come by to pick up the disability forms from the front office.

## 2021-01-24 NOTE — Telephone Encounter (Signed)
Please contact pt and make aware that provider doesn't fill out disability paperwork. The disability company can request records

## 2021-01-31 ENCOUNTER — Other Ambulatory Visit: Payer: Self-pay

## 2021-01-31 MED FILL — Insulin Aspart Soln Pen-injector 100 Unit/ML: SUBCUTANEOUS | 33 days supply | Qty: 15 | Fill #0 | Status: AC

## 2021-02-21 ENCOUNTER — Encounter: Payer: Self-pay | Admitting: Internal Medicine

## 2021-02-21 ENCOUNTER — Other Ambulatory Visit: Payer: Self-pay

## 2021-02-21 ENCOUNTER — Ambulatory Visit: Payer: Medicaid Other | Attending: Internal Medicine | Admitting: Internal Medicine

## 2021-02-21 ENCOUNTER — Ambulatory Visit (AMBULATORY_SURGERY_CENTER): Payer: Medicaid Other | Admitting: Gastroenterology

## 2021-02-21 ENCOUNTER — Encounter: Payer: Self-pay | Admitting: Gastroenterology

## 2021-02-21 VITALS — BP 129/83 | HR 79 | Temp 97.1°F | Resp 15 | Ht 70.0 in | Wt 226.0 lb

## 2021-02-21 DIAGNOSIS — F17200 Nicotine dependence, unspecified, uncomplicated: Secondary | ICD-10-CM | POA: Diagnosis not present

## 2021-02-21 DIAGNOSIS — K219 Gastro-esophageal reflux disease without esophagitis: Secondary | ICD-10-CM

## 2021-02-21 DIAGNOSIS — K319 Disease of stomach and duodenum, unspecified: Secondary | ICD-10-CM

## 2021-02-21 DIAGNOSIS — F17210 Nicotine dependence, cigarettes, uncomplicated: Secondary | ICD-10-CM | POA: Insufficient documentation

## 2021-02-21 DIAGNOSIS — K2970 Gastritis, unspecified, without bleeding: Secondary | ICD-10-CM

## 2021-02-21 DIAGNOSIS — F331 Major depressive disorder, recurrent, moderate: Secondary | ICD-10-CM | POA: Insufficient documentation

## 2021-02-21 DIAGNOSIS — R112 Nausea with vomiting, unspecified: Secondary | ICD-10-CM

## 2021-02-21 DIAGNOSIS — K2950 Unspecified chronic gastritis without bleeding: Secondary | ICD-10-CM | POA: Diagnosis not present

## 2021-02-21 DIAGNOSIS — F1210 Cannabis abuse, uncomplicated: Secondary | ICD-10-CM | POA: Diagnosis not present

## 2021-02-21 DIAGNOSIS — K449 Diaphragmatic hernia without obstruction or gangrene: Secondary | ICD-10-CM

## 2021-02-21 DIAGNOSIS — Z79899 Other long term (current) drug therapy: Secondary | ICD-10-CM | POA: Insufficient documentation

## 2021-02-21 DIAGNOSIS — R109 Unspecified abdominal pain: Secondary | ICD-10-CM

## 2021-02-21 MED ORDER — SODIUM CHLORIDE 0.9 % IV SOLN: 500.0000 mL | Freq: Once | INTRAVENOUS | Status: AC

## 2021-02-21 MED ORDER — NICOTINE 21 MG/24HR TD PT24
21.0000 mg | MEDICATED_PATCH | Freq: Every day | TRANSDERMAL | 1 refills | Status: DC
  Filled 2021-02-21: qty 28, 28d supply, fill #0

## 2021-02-21 MED ORDER — SERTRALINE HCL 50 MG PO TABS
ORAL_TABLET | ORAL | 2 refills | Status: DC
  Filled 2021-02-21: qty 15, 30d supply, fill #0

## 2021-02-21 NOTE — Op Note (Signed)
[facility] Patient Name: Jonathan Porter Procedure Date: 02/21/2021 4:09 PM MRN: [id number] Endoscopist: Justice Britain , MD Age: 30 Referring MD:  Date of Birth: 1991-08-26 Gender: Male Account #: [id number] Procedure:                Upper GI endoscopy Indications:              Generalized abdominal pain, Heartburn, Nausea with                            vomiting, Persistent vomiting of unknown cause,                            Concern for Gastroparesis though normal GES Medicines:                Monitored Anesthesia Care Procedure:                Pre-Anesthesia Assessment:                           - Prior to the procedure, a History and Physical                            was performed, and patient medications and                            allergies were reviewed. The patient's tolerance of                            previous anesthesia was also reviewed. The risks                            and benefits of the procedure and the sedation                            options and risks were discussed with the patient.                            All questions were answered, and informed consent                            was obtained. Prior Anticoagulants: The patient has                            taken no previous anticoagulant or antiplatelet                            agents. ASA Grade Assessment: II - A patient with                            mild systemic disease. After reviewing the risks                            and benefits, the patient was deemed in  satisfactory condition to undergo the procedure.                           After obtaining informed consent, the endoscope was                            passed under direct vision. Throughout the                            procedure, the patient's blood pressure, pulse, and                            oxygen saturations were monitored continuously. The                            Endoscope  was introduced through the mouth, and                            advanced to the second part of duodenum. The upper                            GI endoscopy was accomplished without difficulty.                            The patient tolerated the procedure. Scope In: Scope Out: Findings:                 White nummular lesions were noted in the entire                            esophagus. Biopsies were taken with a cold forceps                            for histology to rule out Candida and EoE.                           The Z-line was regular and was found 40 cm from the                            incisors.                           A 3 cm hiatal hernia was present.                           A possible angulation deformity was found in the                            gastric body.                           Localized moderately erythematous mucosa without                            bleeding was found in the gastric antrum.  No other gross lesions were noted in the entire                            examined stomach. Biopsies were taken with a cold                            forceps for histology and Helicobacter pylori                            testing.                           No gross lesions were noted in the duodenal bulb,                            in the first portion of the duodenum and in the                            second portion of the duodenum. Biopsies were taken                            with a cold forceps for histology. Complications:            No immediate complications. Estimated Blood Loss:     Estimated blood loss was minimal. Impression:               - White nummular lesions in esophageal mucosa.                            Biopsied.                           - Z-line regular, 40 cm from the incisors.                           - 3 cm hiatal hernia.                           - An angulation deformity noted in the gastric body.                            - Erythematous mucosa in the antrum. No other gross                            lesions in the stomach. Biopsied.                           - No gross lesions in the duodenal bulb, in the                            first portion of the duodenum and in the second                            portion of the duodenum. Biopsied. Recommendation:           -  The patient will be observed post-procedure,                            until all discharge criteria are met.                           - Discharge patient to home.                           - Patient has a contact number available for                            emergencies. The signs and symptoms of potential                            delayed complications were discussed with the                            patient. Return to normal activities tomorrow.                            Written discharge instructions were provided to the                            patient.                           - Resume previous diet.                           - Continue present medications.                           - Await pathology results.                           - Would consider an Upper GI series to better                            define the gastric anatomy if patient agreeable to                            this.                           - Minimize/stop all THC use as it is                            possible/likely this is a result of Cannabinoid                            Hyperemesis.                           - Botox injection to Pylorus not clearly indicated  without overt Gastric Emptying Study showing                            Gastroparesis, though he is on Reglan that could                            cloud the picture.                           - Follow up in clinic to further discuss symptoms.                           - Agree with trial of SSRI/TCA as is being done by                            PCP  for Functional Abdominal Pain treatment.                           - WIll consider additional PPI based on final                            results of pathology.                           - The findings and recommendations were discussed                            with the patient.                           - The findings and recommendations were discussed                            with the designated responsible adult. Justice Britain, MD 02/21/2021 4:42:08 PM

## 2021-02-21 NOTE — Progress Notes (Signed)
Called to room to assist during endoscopic procedure.  Patient ID and intended procedure confirmed with present staff. Received instructions for my participation in the procedure from the performing physician.  

## 2021-02-21 NOTE — Progress Notes (Signed)
Pt states he never picked up the zoloft because of the price

## 2021-02-21 NOTE — Patient Instructions (Addendum)
YOU HAD AN ENDOSCOPIC PROCEDURE TODAY AT THE [facility]:   Refer to the procedure report that was given to you for any specific questions about what was found during the examination.  If the procedure report does not answer your questions, please call your gastroenterologist to clarify.  If you requested that your care partner not be given the details of your procedure findings, then the procedure report has been included in a sealed envelope for you to review at your convenience later.  YOU SHOULD EXPECT: Some feelings of bloating in the abdomen. Passage of more gas than usual.  Walking can help get rid of the air that was put into your GI tract during the procedure and reduce the bloating. If you had a lower endoscopy (such as a colonoscopy or flexible sigmoidoscopy) you may notice spotting of blood in your stool or on the toilet paper. If you underwent a bowel prep for your procedure, you may not have a normal bowel movement for a few days.  Please Note:  You might notice some irritation and congestion in your nose or some drainage.  This is from the oxygen used during your procedure.  There is no need for concern and it should clear up in a day or so.  SYMPTOMS TO REPORT IMMEDIATELY:     Following upper endoscopy (EGD)  Vomiting of blood or coffee ground material  New chest pain or pain under the shoulder blades  Painful or persistently difficult swallowing  New shortness of breath  Fever of 100F or higher  Black, tarry-looking stools  For urgent or emergent issues, a gastroenterologist can be reached at any hour by calling [phone]. Do not use MyChart messaging for urgent concerns.    DIET:  We do recommend a small meal at first, but then you may proceed to your regular diet.  Drink plenty of fluids but you should avoid alcoholic beverages for 24 hours.  MEDICATIONS: Continue present medications.  Minimize/stop all THC use as it is possible/likely this is a result  of Cannabinoid Hyperemesis (nausea/vomiting).  FOLLOW UP: Follow up with Dr. Meridee Score in his office to further discuss symptoms. Further recommendations will be based on pathology results.  Please see handouts given to you by your recovery nurse.  Thank you for allowing [place] to provide for your healthcare needs today.  ACTIVITY:  You should plan to take it easy for the rest of today and you should NOT DRIVE or use heavy machinery until tomorrow (because of the sedation medicines used during the test).    FOLLOW UP: Our staff will call the number listed on your records 48-72 hours following your procedure to check on you and address any questions or concerns that you may have regarding the information given to you following your procedure. If we do not reach you, we will leave a message.  We will attempt to reach you two times.  During this call, we will ask if you have developed any symptoms of COVID 19. If you develop any symptoms (ie: fever, flu-like symptoms, shortness of breath, cough etc.) before then, please call [phone].  If you test positive for Covid 19 in the 2 weeks post procedure, please call and report this information to [place].    If any biopsies were taken you will be contacted by phone or by letter within the next 1-3 weeks.  Please call us at [phone] if you have not heard about the biopsies in 3 weeks.  SIGNATURES/CONFIDENTIALITY: You and/or your care partner have signed paperwork which will be entered into your electronic medical record.  These signatures attest to the fact that that the information above on your After Visit Summary has been reviewed and is understood.  Full responsibility of the confidentiality of this discharge information lies with you and/or your care-partner.

## 2021-02-21 NOTE — Progress Notes (Signed)
Report to PACU, RN, vss, BBS= Clear.  

## 2021-02-21 NOTE — Progress Notes (Signed)
Patient ID: Jonathan Porter, male    DOB: 1991-01-27  MRN: [id number]  CC: Depression (F/u)   Subjective: Jonathan Porter is a 30 y.o. male who presents for 6 wks f/u on depression and recurrent abdominal pain. His concerns today include:  Patient with history of insulin-dependent diabetes, genital warts, tob dep, ED, marijuana use,  On last visit, patient was started on Zoloft for depression and to address any component of functional abdominal pain.  Patient did not pick up the prescription stating that he never made it to the pharmacy to pick it up.  He also states that he has not been working as much and is limited on funds.  States he was told by the pharmacy that the co-pay was about $20-26.  He has Medicaid [place] access. -His PHQ-9 score has decreased by 6 points compared to last visit.  Today's score is 14.  He feels his depression has not improved and if anything he feels it is worse.  No suicidal ideation. Has an appointment with GI today for endoscopy.  Tobacco dependence: On last visit he expressed a desire to quit and was wanting to try the nicotine patches again.  We discussed the stepdown approach and I sent the prescription to his pharmacy.  However he did not pick it up stating he was told that his co-pay would be $60-$80.  He expressed frustration with that.  He feels the patches should be free if people are wanting to quit smoking. Patient Active Problem List   Diagnosis Date Noted  . Major depressive disorder, single episode, moderate ([place]) 01/01/2021  . Abdominal pain, recurrent 01/01/2021  . Marijuana abuse 12/17/2020  . Intractable nausea and vomiting 07/03/2019  . Gastroparesis 07/02/2019  . Influenza vaccine refused 06/03/2019  . Erectile dysfunction 06/03/2019  . Obesity (BMI 30-39.9) 06/03/2019  . Type 1 diabetes mellitus with diabetic polyneuropathy ([place]) 01/21/2019  . Type 1 diabetes mellitus with hyperglycemia ([place]) 01/21/2019  . Paronychia of finger of  left hand 01/20/2017  . 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 Diabetes type 1, uncontrolled ([place]) 08/23/2015  . Onychomycosis of toenail 08/23/2015  . Phimosis 08/07/2014     Current Outpatient Medications on File Prior to Visit  Medication Sig Dispense Refill  . Accu-Chek FastClix Lancets MISC Use to check blood sugar up to 3 times daily. 102 each 11  . blood glucose meter kit and supplies KIT Dispense based on patient and insurance preference. Use up to four times daily as directed. (FOR ICD-9 250.00, 250.01). 1 each 0  . Blood Glucose Monitoring Suppl (ACCU-CHEK GUIDE ME) w/Device KIT 1 kit by Does not apply route 3 (three) times daily. Use to check blood sugar up to 3 times daily. 1 kit 0  . dicyclomine (BENTYL) 20 MG tablet Take 1 tablet (20 mg total) by mouth every 8 (eight) hours as needed for spasms. 30 tablet 0  . glucose blood test strip USE AS DIRECTED UP TO FOUR TIMES DAILY (Patient taking differently: USE AS DIRECTED UP TO FOUR TIMES DAILY) 100 strip 0  . insulin aspart (NOVOLOG) 100 UNIT/ML FlexPen INJECT 15 UNITS INTO THE SKIN 3 (THREE) TIMES DAILY WITH MEALS. (Patient taking differently: Inject 15 Units into the skin 3 (three) times daily with meals.) 15 mL 4  . insulin glargine (LANTUS) 100 UNIT/ML Solostar Pen INJECT 15 UNITS INTO THE SKIN DAILY. (Patient taking differently: Inject 15 Units into the skin at bedtime.) 15 mL 5  .  metoCLOPramide (REGLAN) 10 MG tablet Take 1 tablet 1/2-hour before meals (Patient not taking: Reported on 12/28/2020) 90 tablet 6  . omeprazole (PRILOSEC) 40 MG capsule Take 1 capsule (40 mg total) by mouth daily with breakfast. (Patient not taking: Reported on 12/28/2020) 30 capsule 8  . promethazine (PHENERGAN) 25 MG suppository Place 1 suppository (25 mg total) rectally every 8 (eight) hours as needed for nausea or vomiting. 12 each 2  . sildenafil (VIAGRA) 50 MG tablet TAKE ONE TABLET BY MOUTH 30 MINUTES PRIOR TO SEXUAL INTERCOURSE  AS NEEDED. LIMIT 1 IN 24 HOURS 10 tablet 4  . TRUEPLUS 5-BEVEL PEN NEEDLES 31G X 8 MM MISC USE AS DIRECTED UP TO FOUR TIMES DAILY 100 each 11  . [DISCONTINUED] sucralfate (CARAFATE) 1 g tablet Take 1 tablet (1 g total) by mouth 4 (four) times daily for 14 days. (Patient not taking: Reported on 02/23/2020) 56 tablet 0   No current facility-administered medications on file prior to visit.    Allergies  Allergen Reactions  . Shellfish Allergy Anaphylaxis    Social History   Socioeconomic History  . Marital status: Single    Spouse name: Not on file  . Number of children: Not on file  . Years of education: Not on file  . Highest education level: Not on file  Occupational History  . Not on file  Tobacco Use  . Smoking status: Current Every Day Smoker    Packs/day: 0.50    Years: 6.00    Pack years: 3.00    Types: Cigarettes  . Smokeless tobacco: Never Used  Vaping Use  . Vaping Use: Never used  Substance and Sexual Activity  . Alcohol use: Yes    Comment: social   . Drug use: Yes    Types: Marijuana  . Sexual activity: Yes    Birth control/protection: Condom    Comment: stated used condom 80% of the time   Other Topics Concern  . Not on file  Social History Narrative  . Not on file   Social Determinants of Health   Financial Resource Strain: Not on file  Food Insecurity: Not on file  Transportation Needs: Not on file  Physical Activity: Not on file  Stress: Not on file  Social Connections: Not on file  Intimate Partner Violence: Not on file    Family History  Problem Relation Age of Onset  . Thyroid disease Mother   . Diabetes Maternal Aunt   . Diabetes Maternal Grandmother   . Diabetes Maternal Grandfather   . Thyroid disease Other     Past Surgical History:  Procedure Laterality Date  . NO PAST SURGERIES      ROS: Review of Systems Negative except as stated above  PHYSICAL EXAM: BP 127/87   Pulse 92   Resp 16   Wt 219 lb 12.8 oz (99.7 kg)    SpO2 98%   BMI 31.54 kg/m   Physical Exam General appearance - alert, well appearing, and in no distress Mental status - normal mood, behavior, speech, dress, motor activity, and thought processes  Depression screen [facility] 2/9 02/21/2021 01/01/2021 12/01/2019  Decreased Interest 2 2 0  Down, Depressed, Hopeless 2 2 0  PHQ - 2 Score 4 4 0  Altered sleeping 2 3 0  Tired, decreased energy 2 3 0  Change in appetite 3 3 0  Feeling bad or failure about yourself  1 1 0  Trouble concentrating 2 3 0  Moving slowly or fidgety/restless 0 3  0  Suicidal thoughts 0 0 0  PHQ-9 Score 14 20 0  Some recent data might be hidden    CMP Latest Ref Rng & Units 12/30/2020 12/26/2020 12/26/2020  Glucose 70 - 99 mg/dL 245(H) 279(H) 219(H)  BUN 6 - 20 mg/dL 14 13 11   Creatinine 0.61 - 1.24 mg/dL 1.14 1.16 1.19  Sodium 135 - 145 mmol/L 136 135 137  Potassium 3.5 - 5.1 mmol/L 4.0 3.7 3.7  Chloride 98 - 111 mmol/L 99 102 103  CO2 22 - 32 mmol/L 23 18(L) 23  Calcium 8.9 - 10.3 mg/dL 9.5 9.7 9.7  Total Protein 6.5 - 8.1 g/dL 7.9 - 8.6(H)  Total Bilirubin 0.3 - 1.2 mg/dL 1.1 - 1.0  Alkaline Phos 38 - 126 U/L 94 - 101  AST 15 - 41 U/L 18 - 18  ALT 0 - 44 U/L 15 - 14   Lipid Panel     Component Value Date/Time   CHOL (H) 08/31/2007 0440    339        ATP III CLASSIFICATION:  <200     mg/dL   Desirable  200-239  mg/dL   Borderline High  >=240    mg/dL   High   TRIG 260 (H) 08/31/2007 0440   HDL 39 08/31/2007 0440   CHOLHDL 8.7 08/31/2007 0440   VLDL 52 (H) 08/31/2007 0440   LDLCALC (H) 08/31/2007 0440    248        Total Cholesterol/HDL:CHD Risk Coronary Heart Disease Risk Table                     Men   Women  1/2 Average Risk   3.4   3.3    CBC    Component Value Date/Time   WBC 13.0 (H) 12/30/2020 0241   RBC 5.54 12/30/2020 0241   HGB 15.4 12/30/2020 0241   HCT 45.8 12/30/2020 0241   PLT 348 12/30/2020 0241   MCV 82.7 12/30/2020 0241   MCH 27.8 12/30/2020 0241   MCHC 33.6 12/30/2020 0241    RDW 13.7 12/30/2020 0241   LYMPHSABS 3.2 12/26/2020 1030   MONOABS 0.5 12/26/2020 1030   EOSABS 0.4 12/26/2020 1030   BASOSABS 0.1 12/26/2020 1030    ASSESSMENT AND PLAN: 1. Major depressive disorder, recurrent episode, moderate ([place]) I did speak with our clinical pharmacist today to inquire about patient's co-pay with Medicaid [place] access.  I am told that his co-pay would be $3 at any pharmacy.  Patient elected to try to get it at our pharmacy today. -We discussed referring him for some counseling also which may work better in combination with medication.  Patient declines at this time.  We will have him follow-up in 8 weeks to see how he is doing on the medication. - sertraline (ZOLOFT) 50 MG tablet; 1/2 tab by mouth daily x 1 mth then 1 tab daily  Dispense: 30 tablet; Refill: 2  2. Tobacco dependence I spoke with our clinical pharmacist.  I was informed that his co-pay on the nicotine patches should be $3.  Patient opted to get it through our pharmacy today. - nicotine (NICODERM CQ - DOSED IN MG/24 HOURS) 21 mg/24hr patch; Place 1 patch (21 mg total) onto the skin daily.  Dispense: 28 patch; Refill: 1   Patient was given the opportunity to ask questions.  Patient verbalized understanding of the plan and was able to repeat key elements of the plan.   No orders of the  defined types were placed in this encounter.    Requested Prescriptions   Signed Prescriptions Disp Refills  . sertraline (ZOLOFT) 50 MG tablet 30 tablet 2    Sig: 1/2 tab by mouth daily x 1 mth then 1 tab daily  . nicotine (NICODERM CQ - DOSED IN MG/24 HOURS) 21 mg/24hr patch 28 patch 1    Sig: Place 1 patch (21 mg total) onto the skin daily.    Return in about 2 months (around 04/23/2021).  Karle Plumber, MD, FACP

## 2021-02-21 NOTE — Progress Notes (Signed)
VS-CW 

## 2021-02-25 ENCOUNTER — Telehealth: Payer: Self-pay | Admitting: *Deleted

## 2021-02-25 NOTE — Telephone Encounter (Signed)
1. Have you developed a fever since your procedure? no  2.   Have you had an respiratory symptoms (SOB or cough) since your procedure? no  3.   Have you tested positive for COVID 19 since your procedure no  4.   Have you had any family members/close contacts diagnosed with the COVID 19 since your procedure?  no   If yes to any of these questions please route to Laverna Peace, RN and Karlton Lemon, RN Follow up Call-  Call back number 02/21/2021  Post procedure Call Back phone  # [phone]  Permission to leave phone message Yes  Some recent data might be hidden     Patient questions:  Do you have a fever, pain , or abdominal swelling? No. Pain Score  0 *  Have you tolerated food without any problems? Yes.    Have you been able to return to your normal activities? Yes.    Do you have any questions about your discharge instructions: Diet   No. Medications  No. Follow up visit  No.  Do you have questions or concerns about your Care? No.  Actions: * If pain score is 4 or above: No action needed, pain <4.

## 2021-02-26 ENCOUNTER — Encounter: Payer: Self-pay | Admitting: Gastroenterology

## 2021-02-28 ENCOUNTER — Other Ambulatory Visit: Payer: Self-pay

## 2021-02-28 DIAGNOSIS — R112 Nausea with vomiting, unspecified: Secondary | ICD-10-CM

## 2021-03-22 ENCOUNTER — Inpatient Hospital Stay (HOSPITAL_COMMUNITY): Admission: RE | Admit: 2021-03-22 | Payer: Medicaid Other | Source: Ambulatory Visit

## 2021-04-11 ENCOUNTER — Other Ambulatory Visit: Payer: Self-pay

## 2021-04-11 MED FILL — Insulin Aspart Soln Pen-injector 100 Unit/ML: SUBCUTANEOUS | 33 days supply | Qty: 15 | Fill #1 | Status: AC

## 2021-06-04 ENCOUNTER — Other Ambulatory Visit: Payer: Self-pay

## 2021-06-04 ENCOUNTER — Other Ambulatory Visit: Payer: Self-pay | Admitting: Internal Medicine

## 2021-06-04 MED ORDER — ACCU-CHEK GUIDE VI STRP
ORAL_STRIP | 0 refills | Status: DC
  Filled 2021-06-04: qty 100, 25d supply, fill #0

## 2021-06-04 MED ORDER — TRUEPLUS 5-BEVEL PEN NEEDLES 31G X 8 MM MISC
11 refills | Status: DC
  Filled 2021-06-04: qty 100, 25d supply, fill #0

## 2021-06-04 MED FILL — Insulin Aspart Soln Pen-injector 100 Unit/ML: SUBCUTANEOUS | 33 days supply | Qty: 15 | Fill #2 | Status: AC

## 2021-06-05 ENCOUNTER — Other Ambulatory Visit: Payer: Self-pay

## 2021-07-15 ENCOUNTER — Other Ambulatory Visit: Payer: Self-pay

## 2021-07-15 MED FILL — Insulin Aspart Soln Pen-injector 100 Unit/ML: SUBCUTANEOUS | 33 days supply | Qty: 15 | Fill #3 | Status: CN

## 2021-07-22 ENCOUNTER — Other Ambulatory Visit: Payer: Self-pay

## 2021-07-26 ENCOUNTER — Other Ambulatory Visit: Payer: Self-pay

## 2021-07-26 MED FILL — Insulin Aspart Soln Pen-injector 100 Unit/ML: SUBCUTANEOUS | 33 days supply | Qty: 15 | Fill #3 | Status: AC

## 2021-08-01 ENCOUNTER — Telehealth: Payer: Self-pay | Admitting: Internal Medicine

## 2021-08-01 NOTE — Telephone Encounter (Signed)
Pt is calling to requesting a copy of the letter written on 01/22 regarding his dog as an emotional support dog. Encouraged the pt to print the letter off of MyChart. Pt states he would like to come and pick up the letter.

## 2021-08-02 NOTE — Telephone Encounter (Signed)
Pt will come by Monday and pick up letter

## 2021-08-28 ENCOUNTER — Telehealth: Payer: Self-pay | Admitting: Internal Medicine

## 2021-08-28 NOTE — Telephone Encounter (Signed)
I received a letter from disability determination services in [facility] regarding this patient.  Requested to speak with me before December 9 stating that they are attempting to assist this patient with his case.  The letter stated that if they were not able to speak with me in a timely manner they may not have enough information to make a determination on his case and may have to make a finding of not disabled because of insufficient evidence.  Phone call placed today to the number on the letter that was [phone].  I was finally able to speak with someone after waiting 15 minutes and q.  This person tells me that the patient had me listed as a third-party on his disability application.  She states that the third-party is usually a friend or family member.  I informed her that I was calling from [facility] and that I am his primary doctor.  Whether I have seen him since May of this year.  I told her that he was seen by me in June.  She states that they do not have that note and that they will be sending out a requests to my office to get that information.

## 2021-09-13 ENCOUNTER — Ambulatory Visit (HOSPITAL_COMMUNITY)
Admission: RE | Admit: 2021-09-13 | Discharge: 2021-09-13 | Disposition: A | Discharge status: Home / Self Care | Payer: Medicaid Other | Attending: Psychiatry | Admitting: Psychiatry

## 2021-09-13 NOTE — H&P (Signed)
Behavioral Health Medical Screening Exam  Jonathan Porter is an 30 y.o. male with past medical history of DM1 and recent documented history of depression who presented to [facility] voluntarily alone for assessment of mood changes, feelings of guilt and worthlessness.  Patient endorses increased feelings of depression, sadness, and feeling overwhelmed with life; inconsistent sleep "wakes up in cold sweats and tachycardia", continues to engages in interest, feelings of guilt and worthlessness, inconsistent concentration, poor appetite. He denies any psychomotor changes or suicidal ideations. He showed several horizontal cuts on his right inner forearm that he states were "stupid, I just got bored"; denies any intent or plan to kill himself. Endorses daily marijuana use describing himself as a "pothead". Provider discussed at length mental and physical effects of marijuana; patient verbalized understanding.   He is requesting "someone to talk to". Says he is a single father with minimal assistance/support and lost his sister in June at the age of 30 from a cocaine/fentanyl overdose and feels that is compounding his stress. Provider discussed [facility] services and open access hours; patient expressed interest, provided with [facility] resources with plan to follow up.   Total Time spent with patient: 20 minutes  Psychiatric Specialty Exam: Physical Exam Vitals and nursing note reviewed.  HENT:     Head: Normocephalic.     Nose: Nose normal.     Mouth/Throat:     Mouth: Mucous membranes are moist.     Pharynx: Oropharynx is clear.  Eyes:     Pupils: Pupils are equal, round, and reactive to light.  Cardiovascular:     Rate and Rhythm: Normal rate.     Pulses: Normal pulses.  Pulmonary:     Effort: Pulmonary effort is normal.  Musculoskeletal:        General: Normal range of motion.     Cervical back: Normal range of motion.  Skin:    General: Skin is warm and dry.  Neurological:     Mental Status: He is  alert and oriented to person, place, and time.  Psychiatric:        Attention and Perception: Attention and perception normal. He does not perceive auditory or visual hallucinations.        Mood and Affect: Mood and affect normal.        Speech: Speech normal.        Behavior: Behavior normal. Behavior is cooperative.        Thought Content: Thought content is not paranoid or delusional. Thought content does not include homicidal or suicidal ideation. Thought content does not include homicidal or suicidal plan.        Cognition and Memory: Cognition and memory normal.        Judgment: Judgment is impulsive.   Review of Systems  Psychiatric/Behavioral:  Positive for decreased concentration, self-injury and sleep disturbance. Negative for hallucinations and suicidal ideas. The patient is nervous/anxious.   All other systems reviewed and are negative. There were no vitals taken for this visit.There is no height or weight on file to calculate BMI. General Appearance: Casual Eye Contact:  Fair Speech:  Clear and Coherent Volume:  Normal Mood:  Dysphoric Affect:  Appropriate and Congruent Thought Process:  Coherent Orientation:  Full (Time, Place, and Person) Thought Content:  Logical Suicidal Thoughts:  No Homicidal Thoughts:  No Memory:  Immediate;   Fair Recent;   Fair Judgement:  Other:  impulsive Insight:  Fair Psychomotor Activity:  Normal Concentration: Concentration: Fair and Attention Span: Fair Recall:  Good Fund of Knowledge:Fair Language: Good Akathisia:  NA Handed:  Right AIMS (if indicated):    Assets:  Resilience Social Print production planner Vocational/Educational Sleep:     Musculoskeletal: Strength & Muscle Tone: within normal limits Gait & Station: normal Patient leans: N/A  There were no vitals taken for this visit.  Recommendations: Based on my evaluation the patient does not appear to have an emergency medical condition. Patient given [facility]  resources for individual follow up.   Loletta Parish, NP 09/13/2021, 9:30 AM

## 2021-09-16 ENCOUNTER — Encounter (HOSPITAL_COMMUNITY): Payer: Self-pay | Admitting: Registered Nurse

## 2021-09-16 ENCOUNTER — Ambulatory Visit (HOSPITAL_COMMUNITY)
Admission: EM | Admit: 2021-09-16 | Discharge: 2021-09-16 | Disposition: A | Discharge status: Home / Self Care | Payer: Medicaid Other | Attending: Registered Nurse | Admitting: Registered Nurse

## 2021-09-16 DIAGNOSIS — F32A Depression, unspecified: Secondary | ICD-10-CM | POA: Insufficient documentation

## 2021-09-16 DIAGNOSIS — F321 Major depressive disorder, single episode, moderate: Secondary | ICD-10-CM | POA: Diagnosis present

## 2021-09-16 DIAGNOSIS — F331 Major depressive disorder, recurrent, moderate: Secondary | ICD-10-CM | POA: Diagnosis present

## 2021-09-16 MED ORDER — SERTRALINE HCL 25 MG PO TABS
25.0000 mg | ORAL_TABLET | Freq: Every day | ORAL | 0 refills | Status: DC
  Filled 2021-09-17: qty 30, 30d supply, fill #0

## 2021-09-16 NOTE — Discharge Instructions (Addendum)
[facility] will reopen September 24, 2021.  You will need to be at open access walk in early in morning.  It is first come first serve.

## 2021-09-16 NOTE — ED Provider Notes (Signed)
Behavioral Health Urgent Care Medical Screening Exam  Patient Name: Jonathan Porter MRN: [id number] Date of Evaluation: 09/16/21 Chief Complaint:   Diagnosis:  Final diagnoses:  None    History of Present illness: Jonathan Porter is a 30 y.o. male patient presented to [facility] as a walk in requesting intake for medication management and therapy.    Jonathan Porter, 30 y.o., male patient seen face to face by this provider, consulted with Dr. Margaretha Seeds; and chart reviewed on 09/16/21.  On evaluation Jonathan Porter reports he was seen as a walk int at [facility] and was referred to [facility] for outpatient psychiatric services.  Patient when seen at [facility] he told about his depression, mood swings feelings of guilt, sadness, and overwhelmed with life.  Has refrained from cutting since seen at [facility].  He continues to deny suicidal/homicidal ideation, psychosis, and paranoia.  States he is wanting to get started with outpatient psychiatric services and put on medication for his depression.  States that was his intentions today.  Discussed holiday schedule with patient and informed he would need to come early in morning because open access walk in is first come/first serve and fill up pretty quick.  Understanding voiced.  Patient states he was given a prescription for Zoloft, but pharmacy was giving him a hard time when he tried to get it filled.  I've never had a problem getting my medicine and the one time I really need the medicine they give me a hard time.  Patient states he is unsure what the problem was.  States he has gotten medication filled and [facility] before.  Instructed to go there to get medication filled and if it had to have a pre authorization that sometimes they would do it if prescription was coming from [facility] and where he would be receiving his outpatient psychiatric services.  Patient instructed to be back for intake on 09/24/21.    During evaluation Jonathan Porter is sitting upright in chair in no acute distress.  He is alert/oriented x 4; calm/cooperative; and mood congruent with affect.  He is speaking in a clear tone at moderate volume, and normal pace; with good eye contact.  His thought process is coherent and relevant; There is no indication that he is currently responding to internal/external stimuli or experiencing delusional thought content; and he has denied suicidal/self-harm/homicidal ideation, psychosis, and paranoia.   Patient has remained calm throughout assessment and has answered questions appropriately.   At this time Jonathan Porter is educated and verbalizes understanding of mental health resources and other crisis services in the community. He is instructed to call 911 and present to the nearest emergency room should he experience any suicidal/homicidal ideation, auditory/visual/hallucinations, or detrimental worsening of his mental health condition.  He was a also advised by Clinical research associate that he could call the toll-free phone on insurance card to assist with identifying in network counselors and agencies or number on back of Medicaid card t speak with care coordinator    Psychiatric Specialty Exam  Presentation  General Appearance:Appropriate for Environment; Casual  Eye Contact:Good  Speech:Clear and Coherent; Normal Rate  Speech Volume:Normal  Handedness:No data recorded  Mood and Affect  Mood:Anxious; Depressed  Affect:Congruent   Thought Process  Thought Processes:Coherent; Goal Directed  Descriptions of Associations:Intact  Orientation:No data recorded Thought Content:Logical; WDL    Hallucinations:None  Ideas of Reference:None  Suicidal Thoughts:No  Homicidal Thoughts:No  Sensorium  Memory:Immediate Good; Recent Good; Remote Good  Judgment:Intact  Insight:Present   Executive Functions  Concentration:Good  Attention Span:Good  Recall:Good  Fund of  Knowledge:Good  Language:Good   Psychomotor Activity  Psychomotor Activity:Normal   Assets  Assets:Communication Skills; Desire for Improvement; Financial Resources/Insurance; Housing; Resilience; Social Support   Sleep  Sleep:Fair  Number of hours: No data recorded  Nutritional Assessment (For OBS and FBC admissions only) Has the patient had a weight loss or gain of 10 pounds or more in the last 3 months?: No Has the patient had a decrease in food intake/or appetite?: No Does the patient have dental problems?: No Does the patient have eating habits or behaviors that may be indicators of an eating disorder including binging or inducing vomiting?: No Has the patient recently lost weight without trying?: 0 Has the patient been eating poorly because of a decreased appetite?: 0 Malnutrition Screening Tool Score: 0    Physical Exam: Physical Exam Vitals and nursing note reviewed. Exam conducted with a chaperone present.  Constitutional:      General: He is not in acute distress.    Appearance: Normal appearance. He is not ill-appearing.  Cardiovascular:     Rate and Rhythm: Normal rate.  Pulmonary:     Effort: Pulmonary effort is normal.  Musculoskeletal:        General: Normal range of motion.     Cervical back: Normal range of motion.  Skin:    General: Skin is warm and dry.  Neurological:     Mental Status: He is alert and oriented to person, place, and time.  Psychiatric:        Attention and Perception: Attention and perception normal. He does not perceive auditory or visual hallucinations.        Mood and Affect: Mood is anxious and depressed.        Speech: Speech normal.        Behavior: Behavior normal. Behavior is cooperative.        Thought Content: Thought content normal. Thought content is not paranoid or delusional. Thought content does not include homicidal or suicidal ideation.        Cognition and Memory: Cognition normal.        Judgment: Judgment  normal.   Review of Systems  Constitutional: Negative.   HENT: Negative.    Eyes: Negative.   Respiratory: Negative.    Cardiovascular: Negative.   Gastrointestinal: Negative.   Genitourinary: Negative.   Musculoskeletal: Negative.   Skin: Negative.   Neurological: Negative.   Endo/Heme/Allergies: Negative.   Psychiatric/Behavioral:  Positive for depression. Negative for hallucinations and suicidal ideas. The patient is nervous/anxious and has insomnia.   Blood pressure (!) 141/88, pulse 85, temperature 98.3 F (36.8 C), temperature source Oral, resp. rate 18, height 5\' 11"  (1.803 m), weight 215 lb (97.5 kg), SpO2 100 %. Body mass index is 29.99 kg/m.  Musculoskeletal: Strength & Muscle Tone: within normal limits Gait & Station: normal Patient leans: N/A   [facility] MSE Discharge Disposition for Follow up and Recommendations: Based on my evaluation the patient does not appear to have an emergency medical condition and can be discharged with resources and follow up care in outpatient services for Medication Management and Individual Therapy   Jamielyn Petrucci, NP 09/16/2021, 9:57 AM

## 2021-09-16 NOTE — Progress Notes (Signed)
Patient is a 30 year old male that presents this date with ongoing anxiety/depression. Patient denies any S/I, H/I or AVH. Patient was seen and assessed on 12/23 when he presented to [facility] with similar symptoms (see note as of that date by [facility] NP). Patient was supposed to follow up with [facility] OP services. Patient states he did not understand discharge instructions and presents back this date stating he "was told to come here to be assessed."

## 2021-09-16 NOTE — ED Notes (Signed)
Patient discharged by NP. Patient received AVS with follow up instructions and community resources.

## 2021-09-17 ENCOUNTER — Other Ambulatory Visit: Payer: Self-pay

## 2021-09-17 ENCOUNTER — Other Ambulatory Visit: Payer: Self-pay | Admitting: Internal Medicine

## 2021-09-17 DIAGNOSIS — E1065 Type 1 diabetes mellitus with hyperglycemia: Secondary | ICD-10-CM

## 2021-09-17 MED ORDER — NOVOLOG FLEXPEN 100 UNIT/ML ~~LOC~~ SOPN
PEN_INJECTOR | SUBCUTANEOUS | 0 refills | Status: DC
  Filled 2021-09-17: qty 15, 33d supply, fill #0

## 2021-09-18 ENCOUNTER — Other Ambulatory Visit: Payer: Self-pay

## 2021-09-24 ENCOUNTER — Emergency Department (HOSPITAL_COMMUNITY)
Admission: EM | Admit: 2021-09-24 | Discharge: 2021-09-24 | Disposition: A | Discharge status: Home / Self Care | Payer: Medicaid Other | Attending: Emergency Medicine | Admitting: Emergency Medicine

## 2021-09-24 ENCOUNTER — Emergency Department (HOSPITAL_COMMUNITY): Payer: Medicaid Other

## 2021-09-24 ENCOUNTER — Other Ambulatory Visit: Payer: Self-pay

## 2021-09-24 ENCOUNTER — Encounter (HOSPITAL_COMMUNITY): Payer: Self-pay

## 2021-09-24 ENCOUNTER — Ambulatory Visit: Payer: Medicaid Other | Attending: Nurse Practitioner | Admitting: Nurse Practitioner

## 2021-09-24 DIAGNOSIS — R1013 Epigastric pain: Secondary | ICD-10-CM | POA: Diagnosis present

## 2021-09-24 DIAGNOSIS — R1115 Cyclical vomiting syndrome unrelated to migraine: Secondary | ICD-10-CM | POA: Diagnosis not present

## 2021-09-24 DIAGNOSIS — Z794 Long term (current) use of insulin: Secondary | ICD-10-CM | POA: Diagnosis not present

## 2021-09-24 DIAGNOSIS — R000 Tachycardia, unspecified: Secondary | ICD-10-CM | POA: Insufficient documentation

## 2021-09-24 DIAGNOSIS — K449 Diaphragmatic hernia without obstruction or gangrene: Secondary | ICD-10-CM | POA: Diagnosis not present

## 2021-09-24 DIAGNOSIS — E1043 Type 1 diabetes mellitus with diabetic autonomic (poly)neuropathy: Secondary | ICD-10-CM | POA: Insufficient documentation

## 2021-09-24 LAB — CBC WITH DIFFERENTIAL/PLATELET
Abs Immature Granulocytes: 0.03 10*3/uL (ref 0.00–0.07)
Basophils Absolute: 0.1 10*3/uL (ref 0.0–0.1)
Basophils Relative: 1 %
Eosinophils Absolute: 0.6 10*3/uL — ABNORMAL HIGH (ref 0.0–0.5)
Eosinophils Relative: 6 %
HCT: 44.4 % (ref 39.0–52.0)
Hemoglobin: 14.8 g/dL (ref 13.0–17.0)
Immature Granulocytes: 0 %
Lymphocytes Relative: 46 %
Lymphs Abs: 4.3 10*3/uL — ABNORMAL HIGH (ref 0.7–4.0)
MCH: 27.7 pg (ref 26.0–34.0)
MCHC: 33.3 g/dL (ref 30.0–36.0)
MCV: 83 fL (ref 80.0–100.0)
Monocytes Absolute: 0.6 10*3/uL (ref 0.1–1.0)
Monocytes Relative: 7 %
Neutro Abs: 3.7 10*3/uL (ref 1.7–7.7)
Neutrophils Relative %: 40 %
Platelets: 335 10*3/uL (ref 150–400)
RBC: 5.35 MIL/uL (ref 4.22–5.81)
RDW: 13.8 % (ref 11.5–15.5)
WBC: 9.4 10*3/uL (ref 4.0–10.5)
nRBC: 0 % (ref 0.0–0.2)

## 2021-09-24 LAB — COMPREHENSIVE METABOLIC PANEL
ALT: 24 U/L (ref 0–44)
AST: 24 U/L (ref 15–41)
Albumin: 4.1 g/dL (ref 3.5–5.0)
Alkaline Phosphatase: 102 U/L (ref 38–126)
Anion gap: 10 (ref 5–15)
BUN: 12 mg/dL (ref 6–20)
CO2: 24 mmol/L (ref 22–32)
Calcium: 9.3 mg/dL (ref 8.9–10.3)
Chloride: 101 mmol/L (ref 98–111)
Creatinine, Ser: 1.07 mg/dL (ref 0.61–1.24)
GFR, Estimated: >60 mL/min (ref >60)
Glucose, Bld: 387 mg/dL — ABNORMAL HIGH (ref 70–99)
Potassium: 4.3 mmol/L (ref 3.5–5.1)
Sodium: 135 mmol/L (ref 135–145)
Total Bilirubin: 0.7 mg/dL (ref 0.3–1.2)
Total Protein: 7.6 g/dL (ref 6.5–8.1)

## 2021-09-24 LAB — CBG MONITORING, ED
Glucose-Capillary: 437 mg/dL — ABNORMAL HIGH (ref 70–99)
Glucose-Capillary: 489 mg/dL — ABNORMAL HIGH (ref 70–99)

## 2021-09-24 LAB — LIPASE, BLOOD: Lipase: 25 U/L (ref 11–51)

## 2021-09-24 LAB — TROPONIN I (HIGH SENSITIVITY)
Troponin I (High Sensitivity): 3 ng/L (ref <18)
Troponin I (High Sensitivity): 3 ng/L (ref <18)

## 2021-09-24 MED ORDER — DROPERIDOL 2.5 MG/ML IJ SOLN
1.2500 mg | Freq: Once | INTRAMUSCULAR | Status: DC
Start: 2021-09-24 — End: 2021-09-24

## 2021-09-24 MED ORDER — DROPERIDOL 2.5 MG/ML IJ SOLN
2.5000 mg | Freq: Once | INTRAMUSCULAR | Status: AC
  Administered 2021-09-24: 2.5 mg via INTRAVENOUS
  Filled 2021-09-24: qty 2

## 2021-09-24 MED ORDER — LIDOCAINE VISCOUS HCL 2 % MT SOLN
15.0000 mL | Freq: Once | OROMUCOSAL | Status: AC
  Administered 2021-09-24: 15 mL via ORAL
  Filled 2021-09-24: qty 15

## 2021-09-24 MED ORDER — DIPHENHYDRAMINE HCL 50 MG/ML IJ SOLN
25.0000 mg | Freq: Once | INTRAMUSCULAR | Status: AC
Start: 2021-09-24 — End: 2021-09-24
  Administered 2021-09-24: 25 mg via INTRAVENOUS
  Filled 2021-09-24: qty 1

## 2021-09-24 MED ORDER — MORPHINE SULFATE (PF) 4 MG/ML IV SOLN
4.0000 mg | Freq: Once | INTRAVENOUS | Status: AC
  Administered 2021-09-24: 4 mg via INTRAVENOUS
  Filled 2021-09-24: qty 1

## 2021-09-24 MED ORDER — SODIUM CHLORIDE 0.9 % IV BOLUS
1000.0000 mL | Freq: Once | INTRAVENOUS | Status: AC
  Administered 2021-09-24: 1000 mL via INTRAVENOUS

## 2021-09-24 MED ORDER — KETOROLAC TROMETHAMINE 30 MG/ML IJ SOLN
30.0000 mg | Freq: Once | INTRAMUSCULAR | Status: AC
  Administered 2021-09-24: 30 mg via INTRAVENOUS
  Filled 2021-09-24: qty 1

## 2021-09-24 MED ORDER — INSULIN ASPART 100 UNIT/ML IJ SOLN
5.0000 [IU] | Freq: Once | INTRAMUSCULAR | Status: AC
  Administered 2021-09-24: 5 [IU] via SUBCUTANEOUS
  Filled 2021-09-24: qty 0.05

## 2021-09-24 MED ORDER — ONDANSETRON HCL 4 MG/2ML IJ SOLN
4.0000 mg | Freq: Once | INTRAMUSCULAR | Status: AC
  Administered 2021-09-24: 4 mg via INTRAVENOUS
  Filled 2021-09-24: qty 2

## 2021-09-24 MED ORDER — ALUM & MAG HYDROXIDE-SIMETH 200-200-20 MG/5ML PO SUSP
30.0000 mL | Freq: Once | ORAL | Status: AC
Start: 2021-09-24 — End: 2021-09-24
  Administered 2021-09-24: 30 mL via ORAL
  Filled 2021-09-24: qty 30

## 2021-09-24 MED ORDER — IOHEXOL 350 MG/ML SOLN
80.0000 mL | Freq: Once | INTRAVENOUS | Status: AC | PRN
  Administered 2021-09-24: 80 mL via INTRAVENOUS

## 2021-09-24 MED ORDER — HYDROCODONE-ACETAMINOPHEN 5-325 MG PO TABS
1.0000 | ORAL_TABLET | Freq: Once | ORAL | Status: DC
  Filled 2021-09-24: qty 1

## 2021-09-24 MED ORDER — SODIUM CHLORIDE (PF) 0.9 % IJ SOLN
INTRAMUSCULAR | Status: AC
  Filled 2021-09-24: qty 50

## 2021-09-24 MED ORDER — DICYCLOMINE HCL 10 MG/5ML PO SOLN
10.0000 mg | Freq: Once | ORAL | Status: AC
  Administered 2021-09-24: 10 mg via ORAL
  Filled 2021-09-24: qty 5

## 2021-09-24 MED ORDER — LACTATED RINGERS IV BOLUS
1000.0000 mL | Freq: Once | INTRAVENOUS | Status: AC
  Administered 2021-09-24: 1000 mL via INTRAVENOUS

## 2021-09-24 NOTE — Discharge Instructions (Addendum)
You were evaluated in the Emergency Department and after careful evaluation, we did not find any emergent condition requiring admission or further testing in the hospital.  Your exam/testing today was overall reassuring. Your CT and screening labs were reassuring.  Please return to the Emergency Department if you experience any worsening of your condition.  Thank you for allowing [place] to be a part of your care.

## 2021-09-24 NOTE — ED Notes (Signed)
Save blue tube in main lab °

## 2021-09-24 NOTE — ED Notes (Signed)
IV was not present upon arrival into room. New IV placed. Patient groaning and diaphoretic.

## 2021-09-24 NOTE — ED Provider Notes (Signed)
[facility] DEPT Provider Note   CSN: [id number] Arrival date & time: 09/24/21  0602     History  Chief Complaint  Patient presents with   Hiatal Hernia    Jonathan Porter is a 31 y.o. male.  Patient with past medical history of diabetes type 1, gastroparesis, recurrent abdominal pain, hiatal hernia, and behavioral concerns.  Patient presents the emergency department with epigastric abdominal pain that started about 2 days ago.  He says it is progressively gotten worse.  He has had associated nausea and vomiting has been unable to keep p.o. intake down.  He does not recall when his last bowel movement is, however he is passing gas.  He has had this problem in the past multiple times and thinks is because he has a hiatal hernia and is requesting to have it removed.  He denies any hematemesis, melena, hematochezia, diarrhea, chest pain, shortness of breath.  HPI     Home Medications Prior to Admission medications   Medication Sig Start Date End Date Taking? Authorizing Provider  insulin aspart (NOVOLOG FLEXPEN) 100 UNIT/ML FlexPen INJECT 15 UNITS INTO THE SKIN 3 (THREE) TIMES DAILY WITH MEALS. 09/17/21 09/17/22  Ladell Pier, MD  Accu-Chek FastClix Lancets MISC Use to check blood sugar up to 3 times daily. 06/03/19   Ladell Pier, MD  blood glucose meter kit and supplies KIT Dispense based on patient and insurance preference. Use up to four times daily as directed. (FOR ICD-9 250.00, 250.01). 12/01/19   Argentina Donovan, PA-C  Blood Glucose Monitoring Suppl (ACCU-CHEK GUIDE ME) w/Device KIT 1 kit by Does not apply route 3 (three) times daily. Use to check blood sugar up to 3 times daily. 06/03/19   Ladell Pier, MD  dicyclomine (BENTYL) 20 MG tablet Take 1 tablet (20 mg total) by mouth every 8 (eight) hours as needed for spasms. 12/18/20   Pokhrel, Laxman, MD  glucose blood (ACCU-CHEK GUIDE) test strip USE AS DIRECTED UP TO FOUR TIMES DAILY  06/04/21 06/04/22  Ladell Pier, MD  insulin glargine (LANTUS) 100 UNIT/ML Solostar Pen INJECT 15 UNITS INTO THE SKIN DAILY. Patient taking differently: Inject 15 Units into the skin at bedtime. 10/16/20 10/16/21  Ladell Pier, MD  Insulin Pen Needle (TRUEPLUS 5-BEVEL PEN NEEDLES) 31G X 8 MM MISC USE AS DIRECTED UP TO FOUR TIMES DAILY 06/04/21   Ladell Pier, MD  metoCLOPramide (REGLAN) 10 MG tablet Take 1 tablet 1/2-hour before meals Patient not taking: No sig reported 12/27/20   Esterwood, Amy S, PA-C  nicotine (NICODERM CQ - DOSED IN MG/24 HOURS) 21 mg/24hr patch Place 1 patch (21 mg total) onto the skin daily. 02/21/21   Ladell Pier, MD  omeprazole (PRILOSEC) 40 MG capsule Take 1 capsule (40 mg total) by mouth daily with breakfast. Patient not taking: No sig reported 12/27/20   Esterwood, Amy S, PA-C  promethazine (PHENERGAN) 25 MG suppository Place 1 suppository (25 mg total) rectally every 8 (eight) hours as needed for nausea or vomiting. 12/27/20   Esterwood, Amy S, PA-C  sertraline (ZOLOFT) 25 MG tablet Take 1 tablet (25 mg total) by mouth daily. 09/16/21 09/16/22  Rankin, Shuvon B, NP  sildenafil (VIAGRA) 50 MG tablet TAKE ONE TABLET BY MOUTH 30 MINUTES PRIOR TO SEXUAL INTERCOURSE AS NEEDED. LIMIT 1 IN 24 HOURS 01/01/21   Ladell Pier, MD  sucralfate (CARAFATE) 1 g tablet Take 1 tablet (1 g total) by mouth 4 (four) times  daily for 14 days. Patient not taking: Reported on 02/23/2020 10/01/19 02/23/20  McDonald, Mia A, PA-C      Allergies    Shellfish allergy    Review of Systems   Review of Systems  Constitutional:  Negative for chills and fever.  HENT:  Negative for ear pain and sore throat.   Eyes:  Negative for pain and visual disturbance.  Respiratory:  Negative for cough and shortness of breath.   Cardiovascular:  Negative for chest pain and palpitations.  Gastrointestinal:  Positive for abdominal pain, nausea and vomiting. Negative for abdominal distention, blood in  stool, constipation and diarrhea.  Genitourinary:  Negative for dysuria and hematuria.  Musculoskeletal:  Negative for arthralgias and back pain.  Skin:  Negative for color change and rash.  Neurological:  Negative for seizures and syncope.  All other systems reviewed and are negative.  Physical Exam Updated Vital Signs BP 102/74    Pulse 82    Temp 98.1 F (36.7 C) (Oral)    Resp 15    SpO2 96%  Physical Exam Vitals and nursing note reviewed.  Constitutional:      General: He is in acute distress.     Appearance: Normal appearance. He is not ill-appearing, toxic-appearing or diaphoretic.     Comments: Patient is pacing around the room appears uncomfortable and in acute distress due to pain.  HENT:     Head: Normocephalic and atraumatic.     Nose: No nasal deformity.     Mouth/Throat:     Lips: Pink. No lesions.     Mouth: No injury, lacerations, oral lesions or angioedema.     Pharynx: Uvula midline. No pharyngeal swelling or uvula swelling.  Eyes:     General: Gaze aligned appropriately. No scleral icterus.       Right eye: No discharge.        Left eye: No discharge.     Conjunctiva/sclera: Conjunctivae normal.     Right eye: Right conjunctiva is not injected. No exudate or hemorrhage.    Left eye: Left conjunctiva is not injected. No exudate or hemorrhage. Cardiovascular:     Rate and Rhythm: Regular rhythm. Tachycardia present.     Pulses: Normal pulses.          Radial pulses are 2+ on the right side and 2+ on the left side.       Dorsalis pedis pulses are 2+ on the right side and 2+ on the left side.     Heart sounds: Normal heart sounds, S1 normal and S2 normal. Heart sounds not distant. No murmur heard.   No friction rub. No gallop. No S3 or S4 sounds.  Pulmonary:     Effort: Pulmonary effort is normal. No accessory muscle usage or respiratory distress.     Breath sounds: Normal breath sounds. No stridor. No wheezing, rhonchi or rales.  Chest:     Chest wall: No  tenderness.  Abdominal:     General: Abdomen is flat. There is no distension.     Palpations: Abdomen is soft. There is no mass or pulsatile mass.     Tenderness: There is abdominal tenderness in the epigastric area. There is no guarding or rebound.     Comments: Abdomen is soft.  As I am pressing around entire abdomen, patient states that it hurts worse in his epigastric region, however he has no guarding, rebound, or rigidity.  Musculoskeletal:     Right lower leg: No edema.  Left lower leg: No edema.  Skin:    General: Skin is warm and dry.     Coloration: Skin is not jaundiced or pale.     Findings: No bruising, erythema, lesion or rash.  Neurological:     General: No focal deficit present.     Mental Status: He is alert and oriented to person, place, and time.     GCS: GCS eye subscore is 4. GCS verbal subscore is 5. GCS motor subscore is 6.  Psychiatric:        Mood and Affect: Mood normal.        Behavior: Behavior normal. Behavior is cooperative.    ED Results / Procedures / Treatments   Labs (all labs ordered are listed, but only abnormal results are displayed) Labs Reviewed  COMPREHENSIVE METABOLIC PANEL - Abnormal; Notable for the following components:      Result Value   Glucose, Bld 387 (*)    All other components within normal limits  CBC WITH DIFFERENTIAL/PLATELET - Abnormal; Notable for the following components:   Lymphs Abs 4.3 (*)    Eosinophils Absolute 0.6 (*)    All other components within normal limits  CBG MONITORING, ED - Abnormal; Notable for the following components:   Glucose-Capillary 437 (*)    All other components within normal limits  CBG MONITORING, ED - Abnormal; Notable for the following components:   Glucose-Capillary 489 (*)    All other components within normal limits  LIPASE, BLOOD  BASIC METABOLIC PANEL  TROPONIN I (HIGH SENSITIVITY)  TROPONIN I (HIGH SENSITIVITY)    EKG EKG Interpretation  Date/Time:  Tuesday September 24 2021  09:51:36 EST Ventricular Rate:  67 PR Interval:  94 QRS Duration: 96 QT Interval:  443 QTC Calculation: 468 R Axis:   81 Text Interpretation: Sinus rhythm Short PR interval Confirmed by Regan Lemming ([id number]) on 09/24/2021 10:19:17 AM  Radiology CT ABDOMEN PELVIS W CONTRAST  Result Date: 09/24/2021 CLINICAL DATA:  Abdominal pain, acute nonlocalized. EXAM: CT ABDOMEN AND PELVIS WITH CONTRAST TECHNIQUE: Multidetector CT imaging of the abdomen and pelvis was performed using the standard protocol following bolus administration of intravenous contrast. CONTRAST:  32m OMNIPAQUE IOHEXOL 350 MG/ML SOLN COMPARISON:  CT examination dated October 02, 2019 FINDINGS: Lower chest: No acute abnormality. Hepatobiliary: No focal liver abnormality is seen. No gallstones, gallbladder wall thickening, or biliary dilatation. Pancreas: Unremarkable. No pancreatic ductal dilatation or surrounding inflammatory changes. Spleen: Normal in size without focal abnormality. Adrenals/Urinary Tract: Adrenal glands are unremarkable. 2 mm punctate nonobstructing calculus in the upper pole of the right kidney. Bladder is unremarkable. Stomach/Bowel: Small hiatal hernia. Stomach is within normal limits. Appendix appears normal. No evidence of bowel wall thickening, distention, or inflammatory changes. Vascular/Lymphatic: No significant vascular findings are present. No enlarged abdominal or pelvic lymph nodes. Reproductive: Prostate is unremarkable. Other: No abdominal wall hernia or abnormality. No abdominopelvic ascites. Musculoskeletal: No acute or significant osseous findings. IMPRESSION: No CT evidence of acute abdominal/pelvic process. Electronically Signed   By: IKeane PoliceD.O.   On: 09/24/2021 09:49    Procedures Procedures    Medications Ordered in ED Medications  sodium chloride (PF) 0.9 % injection (has no administration in time range)  sodium chloride 0.9 % bolus 1,000 mL (0 mLs Intravenous Stopped 09/24/21 0715)   ondansetron (ZOFRAN) injection 4 mg (4 mg Intravenous Given 09/24/21 0647)  ketorolac (TORADOL) 30 MG/ML injection 30 mg (30 mg Intravenous Given 09/24/21 0647)  iohexol (OMNIPAQUE) 350 MG/ML injection 80  mL (80 mLs Intravenous Contrast Given 09/24/21 0757)  morphine 4 MG/ML injection 4 mg (4 mg Intravenous Given 09/24/21 0907)  alum & mag hydroxide-simeth (MAALOX/MYLANTA) 200-200-20 MG/5ML suspension 30 mL (30 mLs Oral Given 09/24/21 0829)    And  lidocaine (XYLOCAINE) 2 % viscous mouth solution 15 mL (15 mLs Oral Given 09/24/21 0829)  dicyclomine (BENTYL) 10 MG/5ML solution 10 mg (10 mg Oral Given 09/24/21 0950)  droperidol (INAPSINE) 2.5 MG/ML injection 2.5 mg (2.5 mg Intravenous Given 09/24/21 1043)  diphenhydrAMINE (BENADRYL) injection 25 mg (25 mg Intravenous Given 09/24/21 1200)  lactated ringers bolus 1,000 mL (1,000 mLs Intravenous New Bag/Given 09/24/21 1159)  insulin aspart (novoLOG) injection 5 Units (5 Units Subcutaneous Given 09/24/21 1205)    ED Course/ Medical Decision Making/ A&P                           Medical Decision Making Amount and/or Complexity of Data Reviewed External Data Reviewed: labs, radiology, ECG and notes. Labs: ordered. Decision-making details documented in ED Course. Radiology: ordered and independent interpretation performed. Decision-making details documented in ED Course.    Details: CT imaging with no abnormalities ECG/medicine tests: ordered and independent interpretation performed. Decision-making details documented in ED Course.    Details: Prolonged QTC  Risk Prescription drug management. Parenteral controlled substances.   This is a 31 y.o. male with a PMH of diabetes type 1, gastroparesis, recurrent abdominal pain, hiatal hernia, and behavioral concerns who presents to the ED with gastric abdominal pain that has been gradually worsening over the past 2 days.  He is associated nausea and vomiting.  He apparently has a history of a hiatal hernia, and is  concerned that this is causing his symptoms.  Review of Past Records: Review of past records reveals that patient has been seen in the emergency department multiple times in the past for the same symptoms.  He has not been seen for the symptoms since April 2022, however during that time span he was seen regularly for several months.  Each time, he had a negative work-up, and was referred to gastroenterology.  His symptoms seem to resolve with a combination of Haldol or droperidol, GI cocktail, and morphine.  Patient was initially noted to be diaphoretic and seemingly agitated.  He is walking around his room yelling in pain clutching his abdomen.  Abdominal exam with no peritoneal signs and he has not diffusely tender in any quadrant other than subjective epigastric region.  Vitals with tachycardia to 215, and respiratory rate of 24.  He is afebrile and otherwise hemodynamically stable. I feel that this is likely current gastritis, however, since he has not had the symptoms in a while, will pursue more thorough work-up.  CT abdomen and pelvis was obtained while patient was in triage.   I personally reviewed all laboratory work and imaging. Abnormal results outlined below. Labs reveal a glucose of 387, however no anion gap present or acidosis on CMP.  Electrolytes normal.  CBC without leukocytosis or anemia.  Lipase negative.  Troponin is negative.  CT abdomen pelvis is negative for acute process. EKG with prolonged QTC.  Events/Interventions: Patient provided with a liter of IV fluids, Toradol and Zofran given while in triage.  On reassessment, patient symptoms have not improved.  Will start with GI cocktail, droperidol, and morphine.  Discussed giving droperidol with pharmacist with prolonged QTC. It is okay to give, but we will recheck EKG following dose.   During  stay, blood sugar noted to be increased to 437. He was given another fluid bolus and 5 units of insulin.  On reassessment, abdominal pain  improved after droperidol specifically. Repeat EKG with QTC lower than prior. Repeat blood sugar is 489. Will repeat BMP to make sure he is not in DKA prior to discharge.   1254: I was notified by RN that patient does not wish to stay any further and will leave AMA despite having elevated blood sugars. He is encouraged to check sugars at home and restart insulin therapy. Return precautions provided.   I have seen and evaluated this patient in conjunction with my attending physician who agrees and has made changes to the plan accordingly.  Portions of this note were generated with [employer]. Dictation errors may occur despite best attempts at proofreading.  Final Clinical Impression(s) / ED Diagnoses Final diagnoses:  Cyclic vomiting syndrome  Hiatal hernia    Rx / DC Orders ED Discharge Orders     None         Adolphus Birchwood, PA-C 09/24/21 1255    Regan Lemming, MD 09/24/21 1450

## 2021-09-24 NOTE — ED Notes (Signed)
Pt found to be vomiting into the sink, advised to use emesis bag, pt then started to vomit into the trash can. Advised not to remove anymore IV's since he has removed 2 already. 20 G RAC placed.

## 2021-09-24 NOTE — ED Notes (Signed)
Pt told by staff to sit down several times.

## 2021-09-24 NOTE — Progress Notes (Signed)
LVM. Patient currently in the ED. Instructed to contact office upon discharge to get scheduled for COPD/DM follow up.

## 2021-09-24 NOTE — ED Triage Notes (Signed)
Pt reports with vomiting and upper abdominal pain x 2 days ago. Pt states that he has a hiatal hernia.

## 2021-09-26 ENCOUNTER — Telehealth (HOSPITAL_COMMUNITY): Payer: Self-pay | Admitting: Internal Medicine

## 2021-09-26 NOTE — BH Assessment (Signed)
Care Management - [place] Follow Up Discharges   Writer attempted to make contact with patient today and was unsuccessful.  Writer left a HIPPA compliant voice message.   Per chart review, patient was provided with outpatient resources on 09-16-21.

## 2021-09-27 ENCOUNTER — Other Ambulatory Visit: Payer: Self-pay

## 2021-09-27 ENCOUNTER — Emergency Department (HOSPITAL_COMMUNITY)
Admission: EM | Admit: 2021-09-27 | Discharge: 2021-09-27 | Disposition: A | Discharge status: Home / Self Care | Payer: Medicaid Other | Source: Home / Self Care | Attending: Emergency Medicine | Admitting: Emergency Medicine

## 2021-09-27 ENCOUNTER — Encounter (HOSPITAL_COMMUNITY): Payer: Self-pay

## 2021-09-27 ENCOUNTER — Emergency Department (HOSPITAL_COMMUNITY)
Admission: EM | Admit: 2021-09-27 | Discharge: 2021-09-27 | Disposition: A | Discharge status: Home / Self Care | Payer: Medicaid Other | Attending: Emergency Medicine | Admitting: Emergency Medicine

## 2021-09-27 DIAGNOSIS — R109 Unspecified abdominal pain: Secondary | ICD-10-CM | POA: Diagnosis present

## 2021-09-27 DIAGNOSIS — E109 Type 1 diabetes mellitus without complications: Secondary | ICD-10-CM | POA: Insufficient documentation

## 2021-09-27 DIAGNOSIS — E1043 Type 1 diabetes mellitus with diabetic autonomic (poly)neuropathy: Secondary | ICD-10-CM | POA: Diagnosis not present

## 2021-09-27 DIAGNOSIS — K3184 Gastroparesis: Secondary | ICD-10-CM | POA: Insufficient documentation

## 2021-09-27 DIAGNOSIS — E1143 Type 2 diabetes mellitus with diabetic autonomic (poly)neuropathy: Secondary | ICD-10-CM

## 2021-09-27 DIAGNOSIS — E860 Dehydration: Secondary | ICD-10-CM | POA: Insufficient documentation

## 2021-09-27 DIAGNOSIS — E1065 Type 1 diabetes mellitus with hyperglycemia: Secondary | ICD-10-CM

## 2021-09-27 DIAGNOSIS — Z794 Long term (current) use of insulin: Secondary | ICD-10-CM | POA: Insufficient documentation

## 2021-09-27 LAB — COMPREHENSIVE METABOLIC PANEL
ALT: 18 U/L (ref 0–44)
AST: 22 U/L (ref 15–41)
Albumin: 4.1 g/dL (ref 3.5–5.0)
Alkaline Phosphatase: 102 U/L (ref 38–126)
Anion gap: 11 (ref 5–15)
BUN: 10 mg/dL (ref 6–20)
CO2: 23 mmol/L (ref 22–32)
Calcium: 9.3 mg/dL (ref 8.9–10.3)
Chloride: 99 mmol/L (ref 98–111)
Creatinine, Ser: 1.17 mg/dL (ref 0.61–1.24)
GFR, Estimated: >60 mL/min (ref >60)
Glucose, Bld: 364 mg/dL — ABNORMAL HIGH (ref 70–99)
Potassium: 3.6 mmol/L (ref 3.5–5.1)
Sodium: 133 mmol/L — ABNORMAL LOW (ref 135–145)
Total Bilirubin: 0.8 mg/dL (ref 0.3–1.2)
Total Protein: 7.4 g/dL (ref 6.5–8.1)

## 2021-09-27 LAB — CBC WITH DIFFERENTIAL/PLATELET
Abs Immature Granulocytes: 0.04 10*3/uL (ref 0.00–0.07)
Basophils Absolute: 0.1 10*3/uL (ref 0.0–0.1)
Basophils Relative: 1 %
Eosinophils Absolute: 0.1 10*3/uL (ref 0.0–0.5)
Eosinophils Relative: 1 %
HCT: 43 % (ref 39.0–52.0)
Hemoglobin: 14 g/dL (ref 13.0–17.0)
Immature Granulocytes: 1 %
Lymphocytes Relative: 18 %
Lymphs Abs: 1.5 10*3/uL (ref 0.7–4.0)
MCH: 26.8 pg (ref 26.0–34.0)
MCHC: 32.6 g/dL (ref 30.0–36.0)
MCV: 82.4 fL (ref 80.0–100.0)
Monocytes Absolute: 0.4 10*3/uL (ref 0.1–1.0)
Monocytes Relative: 4 %
Neutro Abs: 6.2 10*3/uL (ref 1.7–7.7)
Neutrophils Relative %: 75 %
Platelets: 330 10*3/uL (ref 150–400)
RBC: 5.22 MIL/uL (ref 4.22–5.81)
RDW: 14 % (ref 11.5–15.5)
WBC: 8.2 10*3/uL (ref 4.0–10.5)
nRBC: 0 % (ref 0.0–0.2)

## 2021-09-27 LAB — RAPID URINE DRUG SCREEN, HOSP PERFORMED
Amphetamines: NOT DETECTED
Barbiturates: NOT DETECTED
Benzodiazepines: NOT DETECTED
Cocaine: NOT DETECTED
Opiates: NOT DETECTED
Tetrahydrocannabinol: POSITIVE — AB

## 2021-09-27 LAB — CBG MONITORING, ED
Glucose-Capillary: 197 mg/dL — ABNORMAL HIGH (ref 70–99)
Glucose-Capillary: 296 mg/dL — ABNORMAL HIGH (ref 70–99)

## 2021-09-27 LAB — LIPASE, BLOOD: Lipase: 21 U/L (ref 11–51)

## 2021-09-27 MED ORDER — PROMETHAZINE HCL 25 MG RE SUPP: 25.0000 mg | Freq: Three times a day (TID) | RECTAL | 2 refills | Status: DC | PRN

## 2021-09-27 MED ORDER — MORPHINE SULFATE (PF) 4 MG/ML IV SOLN
4.0000 mg | Freq: Once | INTRAVENOUS | Status: AC
  Administered 2021-09-27: 4 mg via INTRAVENOUS
  Filled 2021-09-27: qty 1

## 2021-09-27 MED ORDER — FAMOTIDINE 20 MG PO TABS
20.0000 mg | ORAL_TABLET | Freq: Once | ORAL | Status: AC
  Administered 2021-09-27: 20 mg via ORAL
  Filled 2021-09-27: qty 1

## 2021-09-27 MED ORDER — PROMETHAZINE HCL 25 MG PO TABS: 25.0000 mg | ORAL_TABLET | Freq: Four times a day (QID) | ORAL | 0 refills | Status: DC | PRN

## 2021-09-27 MED ORDER — DROPERIDOL 2.5 MG/ML IJ SOLN
1.2500 mg | Freq: Once | INTRAMUSCULAR | Status: AC
  Administered 2021-09-27: 1.25 mg via INTRAVENOUS
  Filled 2021-09-27: qty 2

## 2021-09-27 MED ORDER — SODIUM CHLORIDE 0.9 % IV BOLUS
1000.0000 mL | Freq: Once | INTRAVENOUS | Status: AC
  Administered 2021-09-27: 1000 mL via INTRAVENOUS

## 2021-09-27 MED ORDER — ONDANSETRON 4 MG PO TBDP
4.0000 mg | ORAL_TABLET | Freq: Once | ORAL | Status: AC
  Administered 2021-09-27: 4 mg via ORAL
  Filled 2021-09-27: qty 1

## 2021-09-27 MED ORDER — ACETAMINOPHEN 325 MG PO TABS
650.0000 mg | ORAL_TABLET | Freq: Once | ORAL | Status: AC
  Administered 2021-09-27: 650 mg via ORAL
  Filled 2021-09-27: qty 2

## 2021-09-27 MED ORDER — INSULIN ASPART 100 UNIT/ML IJ SOLN
5.0000 [IU] | Freq: Once | INTRAMUSCULAR | Status: AC
  Administered 2021-09-27: 5 [IU] via INTRAVENOUS

## 2021-09-27 NOTE — Discharge Instructions (Addendum)
Do not use marijuana.  Do not drink sugary drinks.

## 2021-09-27 NOTE — ED Notes (Signed)
Pt kept coming out of triage room stating he was going to start screaming if we didn't get a DR to help him right now. I explained to the pt that we didn't have any rooms available at the moment and that if he started screaming he would be placed back in the lobby. Pt them started screaming at this Architectural technologist a " fucking bitch" security called to triage room to have pt go back to lobby

## 2021-09-27 NOTE — ED Provider Notes (Signed)
[facility] EMERGENCY DEPARTMENT Provider Note   CSN: [id number] Arrival date & time: 09/27/21  0930     History  Chief Complaint  Patient presents with   Hernia   Abdominal Pain    Jonathan Porter is a 31 y.o. male.  Pt is a 31 yo bm with a hx of hiatal hernia, poorly controlled DM, diabetic gastroparesis, and continued MJ use.  Pt presents to the ED today with abdominal pain and n/v.  Pt has had this issue for several years and has been to the ED frequently.  He is very depressed about this constant pain and nausea.  He said he can't hold down a job as he has to miss multiple days.  He said he's been told not to use MJ in the past, but said it helps his stomach pain.  He knows he needs to control his blood sugars better, but he can't stop drinking sugary drinks.  Pt was at [facility] on 1/3 and left AMA.  He did have a ct of his abd and pelvis which did not show anything acute.  He was very agitated earlier today in the waiting room, but is calm now.      Home Medications Prior to Admission medications   Medication Sig Start Date End Date Taking? Authorizing Provider  insulin aspart (NOVOLOG FLEXPEN) 100 UNIT/ML FlexPen INJECT 15 UNITS INTO THE SKIN 3 (THREE) TIMES DAILY WITH MEALS. 09/17/21 09/17/22 Yes Ladell Pier, MD  insulin glargine (LANTUS) 100 UNIT/ML Solostar Pen INJECT 15 UNITS INTO THE SKIN DAILY. Patient taking differently: Inject 15 Units into the skin at bedtime. 10/16/20 10/16/21 Yes Ladell Pier, MD  promethazine (PHENERGAN) 25 MG tablet Take 1 tablet (25 mg total) by mouth every 6 (six) hours as needed for nausea or vomiting. 09/27/21  Yes Isla Pence, MD  sertraline (ZOLOFT) 25 MG tablet Take 1 tablet (25 mg total) by mouth daily. 09/16/21 09/16/22 Yes Rankin, Shuvon B, NP  sildenafil (VIAGRA) 50 MG tablet TAKE ONE TABLET BY MOUTH 30 MINUTES PRIOR TO SEXUAL INTERCOURSE AS NEEDED. LIMIT 1 IN 24 HOURS 01/01/21  Yes Ladell Pier, MD   Accu-Chek FastClix Lancets MISC Use to check blood sugar up to 3 times daily. 06/03/19   Ladell Pier, MD  blood glucose meter kit and supplies KIT Dispense based on patient and insurance preference. Use up to four times daily as directed. (FOR ICD-9 250.00, 250.01). 12/01/19   Argentina Donovan, PA-C  Blood Glucose Monitoring Suppl (ACCU-CHEK GUIDE ME) w/Device KIT 1 kit by Does not apply route 3 (three) times daily. Use to check blood sugar up to 3 times daily. 06/03/19   Ladell Pier, MD  dicyclomine (BENTYL) 20 MG tablet Take 1 tablet (20 mg total) by mouth every 8 (eight) hours as needed for spasms. Patient not taking: Reported on 09/27/2021 12/18/20   Pokhrel, Corrie Mckusick, MD  glucose blood (ACCU-CHEK GUIDE) test strip USE AS DIRECTED UP TO FOUR TIMES DAILY 06/04/21 06/04/22  Ladell Pier, MD  Insulin Pen Needle (TRUEPLUS 5-BEVEL PEN NEEDLES) 31G X 8 MM MISC USE AS DIRECTED UP TO FOUR TIMES DAILY 06/04/21   Ladell Pier, MD  metoCLOPramide (REGLAN) 10 MG tablet Take 1 tablet 1/2-hour before meals Patient not taking: Reported on 12/28/2020 12/27/20   Esterwood, Amy S, PA-C  nicotine (NICODERM CQ - DOSED IN MG/24 HOURS) 21 mg/24hr patch Place 1 patch (21 mg total) onto the skin daily. Patient not taking:  Reported on 09/27/2021 02/21/21   Ladell Pier, MD  omeprazole (PRILOSEC) 40 MG capsule Take 1 capsule (40 mg total) by mouth daily with breakfast. Patient not taking: Reported on 12/28/2020 12/27/20   Esterwood, Amy S, PA-C  promethazine (PHENERGAN) 25 MG suppository Place 1 suppository (25 mg total) rectally every 8 (eight) hours as needed for nausea or vomiting. 09/27/21   Isla Pence, MD  sucralfate (CARAFATE) 1 g tablet Take 1 tablet (1 g total) by mouth 4 (four) times daily for 14 days. Patient not taking: Reported on 02/23/2020 10/01/19 02/23/20  McDonald, Mia A, PA-C      Allergies    Shellfish allergy    Review of Systems   Review of Systems  Gastrointestinal:  Positive for  abdominal pain, nausea and vomiting.  All other systems reviewed and are negative.  Physical Exam Updated Vital Signs BP 130/90 (BP Location: Right Arm)    Pulse 93    Temp 98.8 F (37.1 C) (Oral)    Resp 17    SpO2 99%  Physical Exam Vitals and nursing note reviewed.  Constitutional:      Appearance: He is well-developed.  HENT:     Head: Normocephalic and atraumatic.     Mouth/Throat:     Mouth: Mucous membranes are dry.  Eyes:     Extraocular Movements: Extraocular movements intact.     Pupils: Pupils are equal, round, and reactive to light.  Cardiovascular:     Rate and Rhythm: Normal rate and regular rhythm.     Heart sounds: Normal heart sounds.  Pulmonary:     Effort: Pulmonary effort is normal.     Breath sounds: Normal breath sounds.  Abdominal:     General: Abdomen is flat. Bowel sounds are normal.     Palpations: Abdomen is soft.     Tenderness: There is abdominal tenderness in the epigastric area.  Skin:    General: Skin is warm.     Capillary Refill: Capillary refill takes less than 2 seconds.  Neurological:     General: No focal deficit present.     Mental Status: He is alert and oriented to person, place, and time.  Psychiatric:        Mood and Affect: Mood normal.        Behavior: Behavior normal.    ED Results / Procedures / Treatments   Labs (all labs ordered are listed, but only abnormal results are displayed) Labs Reviewed  COMPREHENSIVE METABOLIC PANEL - Abnormal; Notable for the following components:      Result Value   Sodium 133 (*)    Glucose, Bld 364 (*)    All other components within normal limits  RAPID URINE DRUG SCREEN, HOSP PERFORMED - Abnormal; Notable for the following components:   Tetrahydrocannabinol POSITIVE (*)    All other components within normal limits  CBG MONITORING, ED - Abnormal; Notable for the following components:   Glucose-Capillary 296 (*)    All other components within normal limits  LIPASE, BLOOD  CBC WITH  DIFFERENTIAL/PLATELET  CBG MONITORING, ED    EKG None  Radiology No results found.  Procedures Procedures    Medications Ordered in ED Medications  ondansetron (ZOFRAN-ODT) disintegrating tablet 4 mg (4 mg Oral Given 09/27/21 0942)  acetaminophen (TYLENOL) tablet 650 mg (650 mg Oral Given 09/27/21 0953)  famotidine (PEPCID) tablet 20 mg (20 mg Oral Given 09/27/21 0953)  sodium chloride 0.9 % bolus 1,000 mL (0 mLs Intravenous Stopped 09/27/21 2251)  droperidol (INAPSINE) 2.5 MG/ML injection 1.25 mg (1.25 mg Intravenous Given 09/27/21 2114)  morphine 4 MG/ML injection 4 mg (4 mg Intravenous Given 09/27/21 2114)  insulin aspart (novoLOG) injection 5 Units (5 Units Intravenous Given 09/27/21 2146)    ED Course/ Medical Decision Making/ A&P                           Medical Decision Making  Pt is feeling much better now.  He is able to tolerate po fluids.  I have urged pt to avoid MJ.  He knows that he is not supposed to drink sugary drinks.  Pt is to watch his diet and to take his meds.  He is to f/u with pcp.  Return if worse.        Final Clinical Impression(s) / ED Diagnoses Final diagnoses:  Diabetic gastroparesis ([place])  Dehydration  Poorly controlled type 1 diabetes mellitus ([place])    Rx / DC Orders ED Discharge Orders          Ordered    promethazine (PHENERGAN) 25 MG suppository  Every 8 hours PRN        09/27/21 2250    promethazine (PHENERGAN) 25 MG tablet  Every 6 hours PRN        09/27/21 2250              Isla Pence, MD 09/27/21 2254

## 2021-09-27 NOTE — ED Notes (Signed)
Patient frequently trying to come into the triage area when asked to wait in the lobby. Patient stated, "I'm in fucking pain." Patient stepped back and doors closed.

## 2021-09-27 NOTE — ED Provider Triage Note (Signed)
Emergency Medicine Provider Triage Evaluation Note  Jonathan Porter , a 31 y.o. male  was evaluated in triage.  Pt complains of upper abdominal pain.  Reports nausea, vomiting and diarrhea.  He is concerned that this is due to his hiatal hernia.  Denies fevers or chest pain.  Review of Systems  Positive: Abdominal pain, vomiting, diarrhea Negative: Fever  Physical Exam  BP (!) 142/98 (BP Location: Right Arm)    Pulse 68    Temp 98.2 F (36.8 C) (Oral)    Resp (!) 24    SpO2 100%  Gen:   Awake, no distress   Resp:  Normal effort  MSK:   Moves extremities without difficulty  Other:  Heaving  Medical Decision Making  Medically screening exam initiated at 9:33 AM.  Appropriate orders placed.  Jonathan Porter was informed that the remainder of the evaluation will be completed by another provider, this initial triage assessment does not replace that evaluation, and the importance of remaining in the ED until their evaluation is complete.  Labs and medications ordered   Dietrich Pates, [place] 09/27/21 6384

## 2021-09-27 NOTE — ED Triage Notes (Signed)
EMS stated, glucose 376

## 2021-09-27 NOTE — ED Triage Notes (Signed)
Pt reports nausea and vomiting beginning around 0500 this morning. Describes it as similar to the other day when here.

## 2021-09-27 NOTE — ED Triage Notes (Signed)
EMS stated, he was at [facility] and complaining too loud. Has a hiatal hernia and having upper abdominal pain, flared up this morning. He was dx a year ago.

## 2021-09-27 NOTE — ED Notes (Signed)
Pt. Is screaming and cursing at security and threw his medication on the ground. Stating he wants to see a real Dr. He is in the waiting area and refused any medication

## 2021-09-27 NOTE — ED Notes (Signed)
Jonathan Porter, EDPA notified and states if he comes back in to let him know so he can see him.

## 2021-09-27 NOTE — ED Notes (Signed)
37. New patient coming into triage reported that this patient was screaming at other people in the lobby." Writer went to the lobby and patient was sitting in front of Registration and registration stated that they were through with him. Patient began calling this Clinical research associate a "bitch." One of the patient's in the lobby said "I am sick too and we have been here a long time." Patient then began calling that patient a nappy haired greasy bitch." Security called due to patient yelling and screaming at other patients as well as this Clinical research associate. Patient was told to leave by security.

## 2021-09-27 NOTE — ED Triage Notes (Signed)
Pt. In triage has N/V and thrashing around with abdominal pain

## 2021-09-30 ENCOUNTER — Other Ambulatory Visit: Payer: Self-pay

## 2021-09-30 ENCOUNTER — Emergency Department (HOSPITAL_COMMUNITY)
Admission: EM | Admit: 2021-09-30 | Discharge: 2021-09-30 | Disposition: A | Discharge status: Home / Self Care | Payer: Medicaid Other | Attending: Emergency Medicine | Admitting: Emergency Medicine

## 2021-09-30 DIAGNOSIS — D72829 Elevated white blood cell count, unspecified: Secondary | ICD-10-CM | POA: Diagnosis not present

## 2021-09-30 DIAGNOSIS — R109 Unspecified abdominal pain: Secondary | ICD-10-CM | POA: Diagnosis present

## 2021-09-30 DIAGNOSIS — Z79899 Other long term (current) drug therapy: Secondary | ICD-10-CM | POA: Diagnosis not present

## 2021-09-30 DIAGNOSIS — K3184 Gastroparesis: Secondary | ICD-10-CM | POA: Insufficient documentation

## 2021-09-30 DIAGNOSIS — E119 Type 2 diabetes mellitus without complications: Secondary | ICD-10-CM | POA: Insufficient documentation

## 2021-09-30 DIAGNOSIS — Z794 Long term (current) use of insulin: Secondary | ICD-10-CM | POA: Insufficient documentation

## 2021-09-30 LAB — COMPREHENSIVE METABOLIC PANEL
ALT: 19 U/L (ref 0–44)
AST: 23 U/L (ref 15–41)
Albumin: 4 g/dL (ref 3.5–5.0)
Alkaline Phosphatase: 86 U/L (ref 38–126)
Anion gap: 12 (ref 5–15)
BUN: 12 mg/dL (ref 6–20)
CO2: 24 mmol/L (ref 22–32)
Calcium: 9.3 mg/dL (ref 8.9–10.3)
Chloride: 103 mmol/L (ref 98–111)
Creatinine, Ser: 1.13 mg/dL (ref 0.61–1.24)
GFR, Estimated: >60 mL/min (ref >60)
Glucose, Bld: 225 mg/dL — ABNORMAL HIGH (ref 70–99)
Potassium: 3.7 mmol/L (ref 3.5–5.1)
Sodium: 139 mmol/L (ref 135–145)
Total Bilirubin: 0.6 mg/dL (ref 0.3–1.2)
Total Protein: 7.3 g/dL (ref 6.5–8.1)

## 2021-09-30 LAB — CBC WITH DIFFERENTIAL/PLATELET
Abs Immature Granulocytes: 0.04 10*3/uL (ref 0.00–0.07)
Basophils Absolute: 0.1 10*3/uL (ref 0.0–0.1)
Basophils Relative: 1 %
Eosinophils Absolute: 0.8 10*3/uL — ABNORMAL HIGH (ref 0.0–0.5)
Eosinophils Relative: 7 %
HCT: 43.5 % (ref 39.0–52.0)
Hemoglobin: 14.5 g/dL (ref 13.0–17.0)
Immature Granulocytes: 0 %
Lymphocytes Relative: 38 %
Lymphs Abs: 4.5 10*3/uL — ABNORMAL HIGH (ref 0.7–4.0)
MCH: 27.6 pg (ref 26.0–34.0)
MCHC: 33.3 g/dL (ref 30.0–36.0)
MCV: 82.7 fL (ref 80.0–100.0)
Monocytes Absolute: 0.7 10*3/uL (ref 0.1–1.0)
Monocytes Relative: 6 %
Neutro Abs: 5.8 10*3/uL (ref 1.7–7.7)
Neutrophils Relative %: 48 %
Platelets: 342 10*3/uL (ref 150–400)
RBC: 5.26 MIL/uL (ref 4.22–5.81)
RDW: 14.3 % (ref 11.5–15.5)
WBC: 11.9 10*3/uL — ABNORMAL HIGH (ref 4.0–10.5)
nRBC: 0 % (ref 0.0–0.2)

## 2021-09-30 LAB — RAPID URINE DRUG SCREEN, HOSP PERFORMED
Amphetamines: NOT DETECTED
Barbiturates: NOT DETECTED
Benzodiazepines: NOT DETECTED
Cocaine: NOT DETECTED
Opiates: NOT DETECTED
Tetrahydrocannabinol: POSITIVE — AB

## 2021-09-30 LAB — URINALYSIS, ROUTINE W REFLEX MICROSCOPIC
Bilirubin Urine: NEGATIVE
Glucose, UA: >=500 mg/dL — AB
Ketones, ur: >80 mg/dL — AB
Leukocytes,Ua: NEGATIVE
Nitrite: NEGATIVE
Protein, ur: NEGATIVE mg/dL
Specific Gravity, Urine: 1.015 (ref 1.005–1.030)
pH: 7 (ref 5.0–8.0)

## 2021-09-30 LAB — LIPASE, BLOOD: Lipase: 27 U/L (ref 11–51)

## 2021-09-30 LAB — URINALYSIS, MICROSCOPIC (REFLEX)

## 2021-09-30 LAB — CBG MONITORING, ED: Glucose-Capillary: 215 mg/dL — ABNORMAL HIGH (ref 70–99)

## 2021-09-30 MED ORDER — OXYCODONE-ACETAMINOPHEN 5-325 MG PO TABS
1.0000 | ORAL_TABLET | Freq: Once | ORAL | Status: AC
  Administered 2021-09-30: 1 via ORAL
  Filled 2021-09-30: qty 1

## 2021-09-30 MED ORDER — DICYCLOMINE HCL 20 MG PO TABS: 20.0000 mg | ORAL_TABLET | Freq: Three times a day (TID) | ORAL | 0 refills | Status: DC | PRN

## 2021-09-30 MED ORDER — FAMOTIDINE 20 MG PO TABS
10.0000 mg | ORAL_TABLET | Freq: Once | ORAL | Status: AC
Start: 2021-09-30 — End: 2021-09-30
  Administered 2021-09-30: 10 mg via ORAL
  Filled 2021-09-30: qty 1

## 2021-09-30 MED ORDER — LIDOCAINE VISCOUS HCL 2 % MT SOLN
15.0000 mL | Freq: Once | OROMUCOSAL | Status: AC
Start: 2021-09-30 — End: 2021-09-30
  Administered 2021-09-30: 15 mL via OROMUCOSAL
  Filled 2021-09-30: qty 15

## 2021-09-30 MED ORDER — ONDANSETRON 4 MG PO TBDP
8.0000 mg | ORAL_TABLET | Freq: Once | ORAL | Status: AC
Start: 2021-09-30 — End: 2021-09-30
  Administered 2021-09-30: 8 mg via ORAL
  Filled 2021-09-30: qty 2

## 2021-09-30 MED ORDER — ALUM & MAG HYDROXIDE-SIMETH 200-200-20 MG/5ML PO SUSP
15.0000 mL | Freq: Once | ORAL | Status: AC
  Administered 2021-09-30: 15 mL via ORAL
  Filled 2021-09-30: qty 30

## 2021-09-30 NOTE — ED Notes (Signed)
CBG 215 

## 2021-09-30 NOTE — ED Provider Notes (Signed)
[facility] EMERGENCY DEPARTMENT Provider Note   CSN: [id number] Arrival date & time: 09/30/21  9470     History  Chief Complaint  Patient presents with   Abdominal Pain    Jonathan Porter is a 31 y.o. male with past medical history significant for diabetes, marijuana abuse who presents with abdominal cramping.  Patient has a longstanding history of gastroparesis and states that typically gets worse around this time year.  For the past few days he has had severe abdominal cramping.  He says sometimes it is worse after eating big meals.  He is unable to specify any alleviating factors.  He has not had any recent infectious symptoms such as fevers, chills, cough, congestion, sore throat, urinary symptoms, or changes to bowel movements.  He says he has some mild nausea, but denies vomiting with abdominal cramping.  The patient says that while waiting in the waiting room, his cramping has gotten better but he does have severe reflux pain.  He has been tolerating water by mouth while waiting for room.  He has follow-up with GI on the 25th.     Home Medications Prior to Admission medications   Medication Sig Start Date End Date Taking? Authorizing Provider  insulin aspart (NOVOLOG FLEXPEN) 100 UNIT/ML FlexPen INJECT 15 UNITS INTO THE SKIN 3 (THREE) TIMES DAILY WITH MEALS. Patient taking differently: 15 Units 3 (three) times daily with meals. 09/17/21 09/17/22 Yes Ladell Pier, MD  insulin glargine (LANTUS) 100 UNIT/ML Solostar Pen INJECT 15 UNITS INTO THE SKIN DAILY. Patient taking differently: Inject 15 Units into the skin at bedtime. 10/16/20 10/16/21 Yes Ladell Pier, MD  sildenafil (VIAGRA) 50 MG tablet TAKE ONE TABLET BY MOUTH 30 MINUTES PRIOR TO SEXUAL INTERCOURSE AS NEEDED. LIMIT 1 IN 24 HOURS Patient taking differently: Take 50 mg by mouth as needed for erectile dysfunction. TAKE ONE TABLET BY MOUTH 30 MINUTES PRIOR TO SEXUAL INTERCOURSE AS NEEDED. LIMIT 1 IN  24 HOURS 01/01/21  Yes Ladell Pier, MD  Accu-Chek FastClix Lancets MISC Use to check blood sugar up to 3 times daily. 06/03/19   Ladell Pier, MD  blood glucose meter kit and supplies KIT Dispense based on patient and insurance preference. Use up to four times daily as directed. (FOR ICD-9 250.00, 250.01). 12/01/19   Argentina Donovan, PA-C  Blood Glucose Monitoring Suppl (ACCU-CHEK GUIDE ME) w/Device KIT 1 kit by Does not apply route 3 (three) times daily. Use to check blood sugar up to 3 times daily. 06/03/19   Ladell Pier, MD  dicyclomine (BENTYL) 20 MG tablet Take 1 tablet (20 mg total) by mouth every 8 (eight) hours as needed for spasms. 09/30/21   Adleigh Mcmasters, Amalia Hailey, MD  glucose blood (ACCU-CHEK GUIDE) test strip USE AS DIRECTED UP TO FOUR TIMES DAILY 06/04/21 06/04/22  Ladell Pier, MD  Insulin Pen Needle (TRUEPLUS 5-BEVEL PEN NEEDLES) 31G X 8 MM MISC USE AS DIRECTED UP TO FOUR TIMES DAILY 06/04/21   Ladell Pier, MD  metoCLOPramide (REGLAN) 10 MG tablet Take 1 tablet 1/2-hour before meals Patient not taking: Reported on 12/28/2020 12/27/20   Esterwood, Amy S, PA-C  nicotine (NICODERM CQ - DOSED IN MG/24 HOURS) 21 mg/24hr patch Place 1 patch (21 mg total) onto the skin daily. Patient not taking: Reported on 09/27/2021 02/21/21   Ladell Pier, MD  omeprazole (PRILOSEC) 40 MG capsule Take 1 capsule (40 mg total) by mouth daily with breakfast. Patient not taking:  Reported on 12/28/2020 12/27/20   Esterwood, Amy S, PA-C  promethazine (PHENERGAN) 25 MG suppository Place 1 suppository (25 mg total) rectally every 8 (eight) hours as needed for nausea or vomiting. Patient not taking: Reported on 09/30/2021 09/27/21   Isla Pence, MD  promethazine (PHENERGAN) 25 MG tablet Take 1 tablet (25 mg total) by mouth every 6 (six) hours as needed for nausea or vomiting. Patient not taking: Reported on 09/30/2021 09/27/21   Isla Pence, MD  sertraline (ZOLOFT) 25 MG tablet Take 1 tablet (25 mg  total) by mouth daily. Patient not taking: Reported on 09/30/2021 09/16/21 09/16/22  Rankin, Shuvon B, NP  sucralfate (CARAFATE) 1 g tablet Take 1 tablet (1 g total) by mouth 4 (four) times daily for 14 days. Patient not taking: Reported on 02/23/2020 10/01/19 02/23/20  McDonald, Mia A, PA-C      Allergies    Shellfish allergy    Review of Systems   Review of Systems  Constitutional:  Negative for chills and fever.  HENT:  Negative for congestion.   Respiratory:  Negative for cough and shortness of breath.   Cardiovascular:  Negative for chest pain.  Gastrointestinal:  Positive for abdominal pain and nausea. Negative for constipation, diarrhea and vomiting.  Endocrine: Negative for polyuria.  Genitourinary:  Negative for dysuria and frequency.   Physical Exam Updated Vital Signs BP (!) 142/82    Pulse 72    Temp 98.6 F (37 C) (Oral)    Resp 16    Ht _0  (1.803 m)    Wt 97.5 kg    SpO2 99%    BMI 29.98 kg/m  Physical Exam Vitals and nursing note reviewed.  Constitutional:      General: He is not in acute distress.    Appearance: He is well-developed.  HENT:     Head: Normocephalic and atraumatic.     Right Ear: External ear normal.     Left Ear: External ear normal.     Nose: Nose normal.     Mouth/Throat:     Mouth: Mucous membranes are moist.  Eyes:     Extraocular Movements: Extraocular movements intact.     Conjunctiva/sclera: Conjunctivae normal.     Pupils: Pupils are equal, round, and reactive to light.  Cardiovascular:     Rate and Rhythm: Normal rate and regular rhythm.     Heart sounds: Normal heart sounds. No murmur heard. Pulmonary:     Effort: Pulmonary effort is normal. No respiratory distress.     Breath sounds: Normal breath sounds.  Abdominal:     Palpations: Abdomen is soft.     Tenderness: There is no abdominal tenderness.  Musculoskeletal:        General: No swelling.     Cervical back: Neck supple.  Skin:    General: Skin is warm and dry.      Capillary Refill: Capillary refill takes less than 2 seconds.  Neurological:     General: No focal deficit present.     Mental Status: He is alert and oriented to person, place, and time.  Psychiatric:        Mood and Affect: Mood normal.    ED Results / Procedures / Treatments   Labs (all labs ordered are listed, but only abnormal results are displayed) Labs Reviewed  CBC WITH DIFFERENTIAL/PLATELET - Abnormal; Notable for the following components:      Result Value   WBC 11.9 (*)    Lymphs Abs 4.5 (*)  Eosinophils Absolute 0.8 (*)    All other components within normal limits  COMPREHENSIVE METABOLIC PANEL - Abnormal; Notable for the following components:   Glucose, Bld 225 (*)    All other components within normal limits  URINALYSIS, ROUTINE W REFLEX MICROSCOPIC - Abnormal; Notable for the following components:   Glucose, UA >=500 (*)    Hgb urine dipstick MODERATE (*)    Ketones, ur >80 (*)    All other components within normal limits  RAPID URINE DRUG SCREEN, HOSP PERFORMED - Abnormal; Notable for the following components:   Tetrahydrocannabinol POSITIVE (*)    All other components within normal limits  URINALYSIS, MICROSCOPIC (REFLEX) - Abnormal; Notable for the following components:   Bacteria, UA RARE (*)    All other components within normal limits  CBG MONITORING, ED - Abnormal; Notable for the following components:   Glucose-Capillary 215 (*)    All other components within normal limits  LIPASE, BLOOD    EKG None  Radiology No results found.  Procedures Procedures    Medications Ordered in ED Medications  ondansetron (ZOFRAN-ODT) disintegrating tablet 8 mg (8 mg Oral Given 09/30/21 0934)  oxyCODONE-acetaminophen (PERCOCET/ROXICET) 5-325 MG per tablet 1 tablet (1 tablet Oral Given 09/30/21 0935)  alum & mag hydroxide-simeth (MAALOX/MYLANTA) 200-200-20 MG/5ML suspension 15 mL (15 mLs Oral Given 09/30/21 1617)  lidocaine (XYLOCAINE) 2 % viscous mouth solution 15  mL (15 mLs Mouth/Throat Given 09/30/21 1617)  famotidine (PEPCID) tablet 10 mg (10 mg Oral Given 09/30/21 1616)    ED Course/ Medical Decision Making/ A&P                            Patient presents with chronic abdominal cramping as described in HPI above.  He is currently tolerating p.o. and denies significant nausea.  His abdominal cramping is also resolved while he was waiting to be seen.  His only current complaint is reflux type pain.  Maalox, viscous lidocaine, and Pepcid provided with improvement in symptoms.  Patient CMP is significant for hyperglycemia similar to prior secondary to patient's poorly controlled diabetes.  He is not in DKA.  CMP otherwise without any significant electrolyte abnormalities.  CBC with mild leukocytosis, similar to other recent CBCs.  Patient has afebrile and denies any other infectious symptoms. The patient has a benign abdominal exam and I do not believe additional imaging or lab work-up is necessary at this time.  He had a CT of the abdomen pelvis on 09/24/2021 after he presented with similar symptoms and his scan at that time was unremarkable.  He already has follow-up with his gastroenterologist on 1/25.  We will discharge him with prescription for Bentyl for abdominal cramping.  Discharge instruction and return precautions were discussed with the patient prior to discharge including the AVS.  Patient voiced understanding of these instructions and was amenable with the plan as described.  He was then discharged in stable condition.  Final Clinical Impression(s) / ED Diagnoses Final diagnoses:  Gastroparesis    Rx / DC Orders ED Discharge Orders          Ordered    dicyclomine (BENTYL) 20 MG tablet  Every 8 hours PRN        09/30/21 1606              Avrum Kimball, Amalia Hailey, MD 09/30/21 1637    Elnora Morrison, MD 10/04/21 1252

## 2021-09-30 NOTE — ED Triage Notes (Signed)
Pt c/o abd pain and nausea and vomiting since earlier this morning.

## 2021-09-30 NOTE — ED Provider Triage Note (Signed)
Emergency Medicine Provider Triage Evaluation Note  Jonathan Porter , a 31 y.o. male  was evaluated in triage.  Pt complains of abdominal cramping, nausea, and vomiting.  Symptoms began tonight.  Feels consistent with his diabetic gastroparesis.  Did not check his sugar today but also did not have his insulin.  Denies diarrhea or fever.  Ongoing marijuana use.  Review of Systems  Positive: Abdominal cramping, vomiting Negative: fever  Physical Exam  BP (!) 140/100 (BP Location: Left Arm)    Pulse 72    Temp 98.9 F (37.2 C)    Resp 17    Ht 5\' 11"  (1.803 m)    Wt 97.5 kg    SpO2 100%    BMI 29.98 kg/m   Gen:   Awake, no distress   Resp:  Normal effort  MSK:   Moves extremities without difficulty  Other:  Pacing around triage room  Medical Decision Making  Medically screening exam initiated at 3:08 AM.  Appropriate orders placed.  Jonathan Porter was informed that the remainder of the evaluation will be completed by another provider, this initial triage assessment does not replace that evaluation, and the importance of remaining in the ED until their evaluation is complete.  Abdominal pain, nausea, vomiting.  CBG 215 in triage.  Hx diabetic gastroparesis and ongoing marijuana use.  Will check labs, UDS.   Larene Pickett, PA-C 09/30/21 [phone]

## 2021-09-30 NOTE — ED Notes (Signed)
Pt ambulated to bathroom to provide urine specimen.  

## 2021-09-30 NOTE — ED Notes (Signed)
Attempted to obtain vitals pt stated "I am tired of these vitals just take me back to a room", it was explained that we need to monitor his vitals while he is in the waiting room. Pt stated "Nah I'm good".

## 2021-10-03 ENCOUNTER — Emergency Department (HOSPITAL_COMMUNITY): Payer: Medicaid Other

## 2021-10-03 ENCOUNTER — Other Ambulatory Visit: Payer: Self-pay

## 2021-10-03 ENCOUNTER — Emergency Department (HOSPITAL_COMMUNITY)
Admission: EM | Admit: 2021-10-03 | Discharge: 2021-10-04 | Discharge status: Against Medical Advice | Payer: Medicaid Other | Attending: Emergency Medicine | Admitting: Emergency Medicine

## 2021-10-03 ENCOUNTER — Encounter (HOSPITAL_COMMUNITY): Payer: Self-pay

## 2021-10-03 DIAGNOSIS — R112 Nausea with vomiting, unspecified: Secondary | ICD-10-CM | POA: Insufficient documentation

## 2021-10-03 DIAGNOSIS — E109 Type 1 diabetes mellitus without complications: Secondary | ICD-10-CM | POA: Diagnosis not present

## 2021-10-03 DIAGNOSIS — R1013 Epigastric pain: Secondary | ICD-10-CM | POA: Diagnosis present

## 2021-10-03 DIAGNOSIS — R1011 Right upper quadrant pain: Secondary | ICD-10-CM | POA: Diagnosis not present

## 2021-10-03 DIAGNOSIS — Z794 Long term (current) use of insulin: Secondary | ICD-10-CM | POA: Diagnosis not present

## 2021-10-03 LAB — URINALYSIS, ROUTINE W REFLEX MICROSCOPIC
Bacteria, UA: NONE SEEN
Bilirubin Urine: NEGATIVE
Glucose, UA: >=500 mg/dL — AB
Hgb urine dipstick: NEGATIVE
Ketones, ur: 20 mg/dL — AB
Leukocytes,Ua: NEGATIVE
Nitrite: NEGATIVE
Protein, ur: NEGATIVE mg/dL
Specific Gravity, Urine: 1.022 (ref 1.005–1.030)
pH: 9 — ABNORMAL HIGH (ref 5.0–8.0)

## 2021-10-03 LAB — COMPREHENSIVE METABOLIC PANEL
ALT: 20 U/L (ref 0–44)
AST: 23 U/L (ref 15–41)
Albumin: 3.9 g/dL (ref 3.5–5.0)
Alkaline Phosphatase: 84 U/L (ref 38–126)
Anion gap: 13 (ref 5–15)
BUN: 7 mg/dL (ref 6–20)
CO2: 23 mmol/L (ref 22–32)
Calcium: 9.4 mg/dL (ref 8.9–10.3)
Chloride: 102 mmol/L (ref 98–111)
Creatinine, Ser: 1.06 mg/dL (ref 0.61–1.24)
GFR, Estimated: >60 mL/min (ref >60)
Glucose, Bld: 306 mg/dL — ABNORMAL HIGH (ref 70–99)
Potassium: 3.5 mmol/L (ref 3.5–5.1)
Sodium: 138 mmol/L (ref 135–145)
Total Bilirubin: 0.6 mg/dL (ref 0.3–1.2)
Total Protein: 6.9 g/dL (ref 6.5–8.1)

## 2021-10-03 LAB — CBC WITH DIFFERENTIAL/PLATELET
Abs Immature Granulocytes: 0.03 10*3/uL (ref 0.00–0.07)
Basophils Absolute: 0.1 10*3/uL (ref 0.0–0.1)
Basophils Relative: 1 %
Eosinophils Absolute: 0.4 10*3/uL (ref 0.0–0.5)
Eosinophils Relative: 5 %
HCT: 41.6 % (ref 39.0–52.0)
Hemoglobin: 14.2 g/dL (ref 13.0–17.0)
Immature Granulocytes: 0 %
Lymphocytes Relative: 19 %
Lymphs Abs: 1.9 10*3/uL (ref 0.7–4.0)
MCH: 27.8 pg (ref 26.0–34.0)
MCHC: 34.1 g/dL (ref 30.0–36.0)
MCV: 81.4 fL (ref 80.0–100.0)
Monocytes Absolute: 0.6 10*3/uL (ref 0.1–1.0)
Monocytes Relative: 6 %
Neutro Abs: 6.8 10*3/uL (ref 1.7–7.7)
Neutrophils Relative %: 69 %
Platelets: 331 10*3/uL (ref 150–400)
RBC: 5.11 MIL/uL (ref 4.22–5.81)
RDW: 13.8 % (ref 11.5–15.5)
WBC: 9.8 10*3/uL (ref 4.0–10.5)
nRBC: 0 % (ref 0.0–0.2)

## 2021-10-03 LAB — LIPASE, BLOOD: Lipase: 22 U/L (ref 11–51)

## 2021-10-03 MED ORDER — ONDANSETRON HCL 4 MG/2ML IJ SOLN
INTRAMUSCULAR | Status: AC
  Administered 2021-10-03: 4 mg via INTRAVENOUS
  Filled 2021-10-03: qty 2

## 2021-10-03 MED ORDER — ALUM & MAG HYDROXIDE-SIMETH 200-200-20 MG/5ML PO SUSP
30.0000 mL | Freq: Once | ORAL | Status: AC
  Administered 2021-10-04: 30 mL via ORAL
  Filled 2021-10-03: qty 30

## 2021-10-03 MED ORDER — LIDOCAINE VISCOUS HCL 2 % MT SOLN
15.0000 mL | Freq: Once | OROMUCOSAL | Status: AC
  Administered 2021-10-04: 15 mL via ORAL
  Filled 2021-10-03: qty 15

## 2021-10-03 MED ORDER — FENTANYL CITRATE PF 50 MCG/ML IJ SOSY: 75.0000 ug | PREFILLED_SYRINGE | Freq: Once | INTRAMUSCULAR | Status: DC

## 2021-10-03 MED ORDER — ONDANSETRON HCL 4 MG/2ML IJ SOLN
4.0000 mg | Freq: Once | INTRAMUSCULAR | Status: AC
Start: 2021-10-03 — End: 2021-10-03

## 2021-10-03 MED ORDER — FAMOTIDINE 20 MG PO TABS
20.0000 mg | ORAL_TABLET | Freq: Once | ORAL | Status: AC
  Administered 2021-10-04: 20 mg via ORAL
  Filled 2021-10-03: qty 1

## 2021-10-03 MED ORDER — MORPHINE SULFATE (PF) 2 MG/ML IV SOLN
2.0000 mg | Freq: Once | INTRAVENOUS | Status: AC
  Administered 2021-10-03: 2 mg via INTRAVENOUS
  Filled 2021-10-03: qty 1

## 2021-10-03 NOTE — ED Triage Notes (Signed)
Pt arrived POV from home c/o abdominal pain x 1 week. Pt is dry heaving. Pt states he has been seen but nothing he does is helping.

## 2021-10-03 NOTE — ED Provider Notes (Signed)
°[facility] EMERGENCY DEPARTMENT °Provider Note ° ° °CSN: [id number] °Arrival date & time: 10/03/21  1404 ° °  ° °History ° °Chief Complaint  °Patient presents with  ° Abdominal Pain  ° ° °Amiel J Mckinney is a 31 y.o. male with a past medical history significant for  poorly controlled type 1 diabetes, frequent visits for intractable nausea, vomiting, history of major depressive disorder, as well as gastroparesis who presents with worsening abdominal pain, cramping, nausea, vomiting for the last week.  Patient has been seen many times frequently for similar complaint.  He last received a CT scan on January 3.  He was evaluated since then with a reassuring work-up.  He has been discharged with Bentyl which she reports is not helping, promethazine, as well as Zofran do not help as well.  Patient reports that sugar has been poorly controlled but he is taking his insulin as directed.  Patient denies any diarrhea.  Patient reports that abdominal pain is crampy in nature, located at the epigastrium, worse after meals.  Patient reports he tastes a sour taste in the back of his throat, feels a burning sensation in his stomach.  Patient denies any chest pain, shortness of breath. ° ° °Abdominal Pain °Associated symptoms: nausea and vomiting   ° °  ° °Home Medications °Prior to Admission medications   °Medication Sig Start Date End Date Taking? Authorizing Provider  °Accu-Chek FastClix Lancets MISC Use to check blood sugar up to 3 times daily. 06/03/19   Johnson, Deborah B, MD  °blood glucose meter kit and supplies KIT Dispense based on patient and insurance preference. Use up to four times daily as directed. (FOR ICD-9 250.00, 250.01). 12/01/19   McClung, Angela M, PA-C  °Blood Glucose Monitoring Suppl (ACCU-CHEK GUIDE ME) w/Device KIT 1 kit by Does not apply route 3 (three) times daily. Use to check blood sugar up to 3 times daily. 06/03/19   Johnson, Deborah B, MD  °dicyclomine (BENTYL) 20 MG tablet Take 1  tablet (20 mg total) by mouth every 8 (eight) hours as needed for spasms. 09/30/21   Cox, Chandler F, MD  °glucose blood (ACCU-CHEK GUIDE) test strip USE AS DIRECTED UP TO FOUR TIMES DAILY 06/04/21 06/04/22  Johnson, Deborah B, MD  °insulin aspart (NOVOLOG FLEXPEN) 100 UNIT/ML FlexPen INJECT 15 UNITS INTO THE SKIN 3 (THREE) TIMES DAILY WITH MEALS. °Patient taking differently: 15 Units 3 (three) times daily with meals. 09/17/21 09/17/22  Johnson, Deborah B, MD  °insulin glargine (LANTUS) 100 UNIT/ML Solostar Pen INJECT 15 UNITS INTO THE SKIN DAILY. °Patient taking differently: Inject 15 Units into the skin at bedtime. 10/16/20 10/16/21  Johnson, Deborah B, MD  °Insulin Pen Needle (TRUEPLUS 5-BEVEL PEN NEEDLES) 31G X 8 MM MISC USE AS DIRECTED UP TO FOUR TIMES DAILY 06/04/21   Johnson, Deborah B, MD  °metoCLOPramide (REGLAN) 10 MG tablet Take 1 tablet 1/2-hour before meals °Patient not taking: Reported on 12/28/2020 12/27/20   Esterwood, Amy S, PA-C  °nicotine (NICODERM CQ - DOSED IN MG/24 HOURS) 21 mg/24hr patch Place 1 patch (21 mg total) onto the skin daily. °Patient not taking: Reported on 09/27/2021 02/21/21   Johnson, Deborah B, MD  °omeprazole (PRILOSEC) 40 MG capsule Take 1 capsule (40 mg total) by mouth daily with breakfast. °Patient not taking: Reported on 12/28/2020 12/27/20   Esterwood, Amy S, PA-C  °promethazine (PHENERGAN) 25 MG suppository Place 1 suppository (25 mg total) rectally every 8 (eight) hours as needed for nausea or vomiting. °  vomiting. Patient not taking: Reported on 09/30/2021 09/27/21   Isla Pence, MD  promethazine (PHENERGAN) 25 MG tablet Take 1 tablet (25 mg total) by mouth every 6 (six) hours as needed for nausea or vomiting. Patient not taking: Reported on 09/30/2021 09/27/21   Isla Pence, MD  sertraline (ZOLOFT) 25 MG tablet Take 1 tablet (25 mg total) by mouth daily. Patient not taking: Reported on 09/30/2021 09/16/21 09/16/22  Rankin, Shuvon B, NP  sildenafil (VIAGRA) 50 MG tablet TAKE ONE TABLET BY MOUTH  30 MINUTES PRIOR TO SEXUAL INTERCOURSE AS NEEDED. LIMIT 1 IN 24 HOURS Patient taking differently: Take 50 mg by mouth as needed for erectile dysfunction. TAKE ONE TABLET BY MOUTH 30 MINUTES PRIOR TO SEXUAL INTERCOURSE AS NEEDED. LIMIT 1 IN 24 HOURS 01/01/21   Ladell Pier, MD  sucralfate (CARAFATE) 1 g tablet Take 1 tablet (1 g total) by mouth 4 (four) times daily for 14 days. Patient not taking: Reported on 02/23/2020 10/01/19 02/23/20  McDonald, Mia A, PA-C      Allergies    Shellfish allergy    Review of Systems   Review of Systems  Gastrointestinal:  Positive for abdominal pain, nausea and vomiting.  All other systems reviewed and are negative.  Physical Exam Updated Vital Signs BP (!) 144/88    Pulse 82    Temp 98.7 F (37.1 C)    Resp 14    Ht 5' 11" (1.803 m)    Wt 97.5 kg    SpO2 98%    BMI 29.99 kg/m  Physical Exam Vitals and nursing note reviewed.  Constitutional:      General: He is not in acute distress.    Appearance: Normal appearance. He is obese.     Comments: Anxious appearing patient who is pacing around the room at time my examination due to crampy abdominal pain  HENT:     Head: Normocephalic and atraumatic.  Eyes:     General:        Right eye: No discharge.        Left eye: No discharge.  Cardiovascular:     Rate and Rhythm: Normal rate and regular rhythm.     Heart sounds: No murmur heard.   No friction rub. No gallop.  Pulmonary:     Effort: Pulmonary effort is normal.     Breath sounds: Normal breath sounds.  Abdominal:     General: Bowel sounds are normal.     Palpations: Abdomen is soft.     Comments: Patient has some minimal tenderness palpation in the epigastric versus right upper quadrant region.  He has negative Murphy sign.  He has negative rebound, rigidity, guarding.  Normal bowel sounds throughout.  No ecchymosis noted.  No distention noted.  Skin:    General: Skin is warm and dry.     Capillary Refill: Capillary refill takes less than 2  seconds.  Neurological:     Mental Status: He is alert and oriented to person, place, and time.  Psychiatric:        Mood and Affect: Mood normal.        Behavior: Behavior normal.    ED Results / Procedures / Treatments   Labs (all labs ordered are listed, but only abnormal results are displayed) Labs Reviewed  COMPREHENSIVE METABOLIC PANEL - Abnormal; Notable for the following components:      Result Value   Glucose, Bld 306 (*)    All other components within normal limits  URINALYSIS,  W REFLEX MICROSCOPIC - Abnormal; Notable for the following components:  ° pH 9.0 (*)   ° Glucose, UA >=500 (*)   ° Ketones, ur 20 (*)   ° All other components within normal limits  °CBC WITH DIFFERENTIAL/PLATELET  °LIPASE, BLOOD  ° ° °EKG °None ° °Radiology °US Abdomen Limited RUQ (LIVER/GB) ° °Result Date: 10/03/2021 °CLINICAL DATA:  Chronic right upper quadrant pain. EXAM: ULTRASOUND ABDOMEN LIMITED RIGHT UPPER QUADRANT COMPARISON:  CT abdomen and pelvis dated September 24, 2021. FINDINGS: Gallbladder: No gallstones or wall thickening visualized. No sonographic Murphy sign noted by sonographer. Common bile duct: Diameter: 3 mm, normal. Liver: No focal lesion identified. Within normal limits in parenchymal echogenicity. Portal vein is patent on color Doppler imaging with normal direction of blood flow towards the liver. Other: None. IMPRESSION: 1. Normal right upper quadrant ultrasound. Electronically Signed   By: William T Derry M.D.   On: 10/03/2021 16:50   ° °Procedures °Procedures  ° ° °Medications Ordered in ED °Medications  °alum & mag hydroxide-simeth (MAALOX/MYLANTA) 200-200-20 MG/5ML suspension 30 mL (has no administration in time range)  °  And  °lidocaine (XYLOCAINE) 2 % viscous mouth solution 15 mL (has no administration in time range)  °famotidine (PEPCID) tablet 20 mg (has no administration in time range)  °sodium chloride 0.9 % bolus 1,000 mL (has no administration in time range)  °morphine 2  MG/ML injection 2 mg (2 mg Intravenous Given 10/03/21 2359)  °ondansetron (ZOFRAN) injection 4 mg (4 mg Intravenous Given 10/03/21 2345)  ° ° °ED Course/ Medical Decision Making/ A&P °  °                        °Medical Decision Making ° °Patient has a history of recurrent nausea, vomiting, with history of poorly controlled type 1 diabetes, gastroparesis.  My examination today is physical exam is required for no focal tenderness, some generalized tenderness around epigastric region.  He does appear anxious, he is pacing around the room due to crampy abdominal pain.  He is actively having nausea, vomiting, and attempt to administer meds.  He reports he does have a follow-up with GI doctor on 1/25.  No new eating habits, no excessive alcohol intake, no history of gallstones, or gallbladder dysfunction.  I personally ordered and reviewed lab work which is significant for normal lipase.  CMP is remarkable for elevated blood glucose of 306.  He has no anion gap.  He is not acidotic.  His urinalysis is significant for greater than 500 glucose, as well as some ketones, this is consistent with his elevated blood sugar, some possible dehydration.  His CBC is unremarkable, his white blood cell count is 9.8, normal hemoglobin. ° °I personally ordered and reviewed imaging including ultrasound of the right upper quadrant.  Shows no acute abnormality of the liver or gallbladder.  I agree with the radiologist interpretation. ° °12:37 AM Care of Arhum J Poblete transferred to Dr. Floyd at the end of my shift as the patient will require reassessment once labs/imaging have resulted. Patient presentation, ED course, and plan of care discussed with review of all pertinent labs and imaging. Please see his/her note for further details regarding further ED course and disposition. Plan at time of handoff is p.o. challenge and then discharge. This may be altered or completely changed at the discretion of the oncoming team pending results of  further workup. ° °Final Clinical Impression(s) / ED Diagnoses °Final diagnoses:  °RUQ pain  ° ° °  Rx / DC Orders °ED Discharge Orders   ° ° None  ° °  ° ° °  °,  H, PA-C °10/04/21 0037 ° °  °Floyd, Dan, DO °10/10/21 0818 ° °

## 2021-10-03 NOTE — ED Provider Triage Note (Cosign Needed)
Emergency Medicine Provider Triage Evaluation Note  HOLLISTER WESSLER , a 31 y.o. male  was evaluated in triage.  Pt complains of nominal pain, nausea, vomiting.  This is been going on since 6 AM this morning.  He has had multiple episodes like this in the past.  Has been seen in the emergency department 3 times this month for similar symptoms.  His CT scans have been unremarkable.  He usually gets discharged home after having a cocktail of droperidol or Haldol as well as pain medication.  He says these been taking his medicines at home as prescribed and they are not helping.  He has an upcoming GI appointment but is not for couple weeks..  Review of Systems  Positive:  Negative:   Physical Exam  BP 136/87 (BP Location: Left Arm)    Pulse 63    Temp 98.7 F (37.1 C)    Resp 16    SpO2 100%  Gen:   Awake, no distress   Resp:  Normal effort  MSK:   Moves extremities without difficulty  Other:  Abdomen is Soft.  Tenderness is localized more to the right upper quadrant.  Medical Decision Making  Medically screening exam initiated at 3:11 PM.  Appropriate orders placed.  BENZ VANDENBERGHE was informed that the remainder of the evaluation will be completed by another provider, this initial triage assessment does not replace that evaluation, and the importance of remaining in the ED until their evaluation is complete.  I reviewed past notes.  Does not like he is had an right upper quadrant ultrasound.  We will order this.  He will need to be evaluated and treated in the back.   Claudie Leach, PA-C 10/03/21 1513

## 2021-10-03 NOTE — ED Notes (Signed)
This tech attempted to recheck pt's VS, pt refused. He stated "he was in too much pain to let me check his vitals signs, he was just trying to get back to the back to get pain meds."

## 2021-10-04 MED ORDER — SODIUM CHLORIDE 0.9 % IV BOLUS
1000.0000 mL | Freq: Once | INTRAVENOUS | Status: AC
  Administered 2021-10-04: 1000 mL via INTRAVENOUS

## 2021-10-04 NOTE — ED Notes (Signed)
Patient came out of room and loudly stated "Someone better come get this IV out of my arm because I am leaving"  This RN reminded patient he was not up for DC at this time. Provider made aware of patients requests to leave. Patient declines VS or to sign AMA form. Patient ambulates with a steady gait, alert and oriented, at capacity to make own decisions.  Expresses understanding of AMA including risks.

## 2021-10-04 NOTE — ED Notes (Signed)
Patient drinking water to PO challenge before oral med admin

## 2021-10-16 ENCOUNTER — Ambulatory Visit: Payer: Medicaid Other | Admitting: Nurse Practitioner

## 2021-10-22 ENCOUNTER — Other Ambulatory Visit: Payer: Self-pay | Admitting: Internal Medicine

## 2021-10-22 ENCOUNTER — Other Ambulatory Visit: Payer: Self-pay

## 2021-10-22 DIAGNOSIS — E1065 Type 1 diabetes mellitus with hyperglycemia: Secondary | ICD-10-CM

## 2021-10-22 NOTE — Telephone Encounter (Signed)
Requested medication (s) are due for refill today: yes  Requested medication (s) are on the active medication list: yes  Last refill:  09/17/21 15 ml No RF  Future visit scheduled: no  Notes to clinic:  Has already had a curtesy refill and there is no upcoming appointment scheduled.  Requested Prescriptions  Pending Prescriptions Disp Refills   insulin aspart (NOVOLOG FLEXPEN) 100 UNIT/ML FlexPen 15 mL 0    Sig: INJECT 15 UNITS INTO THE SKIN 3 (THREE) TIMES DAILY WITH MEALS.     Endocrinology:  Diabetes - Insulins Failed - 10/22/2021  3:47 PM      Failed - HBA1C is between 0 and 7.9 and within 180 days    HbA1c, POC (controlled diabetic range)  Date Value Ref Range Status  10/16/2020 12.7 (A) 0.0 - 7.0 % Final   Hgb A1c MFr Bld  Date Value Ref Range Status  12/17/2020 8.7 (H) 4.8 - 5.6 % Final    Comment:    (NOTE) Pre diabetes:          5.7%-6.4%  Diabetes:              >6.4%  Glycemic control for   <7.0% adults with diabetes           Failed - Valid encounter within last 6 months    Recent Outpatient Visits           8 months ago Major depressive disorder, recurrent episode, moderate ([place])   [place] Karle Plumber B, MD   9 months ago Type 1 diabetes mellitus with hyperglycemia [facility])   [place] Ladell Pier, MD   1 year ago Type 1 diabetes mellitus with hyperglycemia [facility])   [place] Ladell Pier, MD   1 year ago Type 1 diabetes mellitus with hyperglycemia [facility])   [place], [place], [place]   2 years ago Diabetes mellitus type 1, uncontrolled, without complications [facility])   [place] Ladell Pier, MD

## 2021-10-25 ENCOUNTER — Other Ambulatory Visit: Payer: Self-pay | Admitting: Internal Medicine

## 2021-10-25 ENCOUNTER — Telehealth: Payer: Self-pay | Admitting: *Deleted

## 2021-10-25 ENCOUNTER — Other Ambulatory Visit: Payer: Self-pay

## 2021-10-25 DIAGNOSIS — E1065 Type 1 diabetes mellitus with hyperglycemia: Secondary | ICD-10-CM

## 2021-10-25 NOTE — Telephone Encounter (Signed)
Pt called and scheduled next available with Zelda, says Novolog is the fast acting insulin he needs and he is completely out. He prefers our pharmacy here. Please advise

## 2021-10-25 NOTE — Telephone Encounter (Signed)
I called pt to make an appt for his 6 month check.   Per note in chart from Dr. Laural Benes he needs to be seen for further refills.   He mentioned he is out of insulin.   There are no appts available with any of the providers until March.    I sending an urgent message to see if they can call and work him in.   Pt was agreeable to this plan.

## 2021-10-25 NOTE — Telephone Encounter (Signed)
Requested medication (s) are due for refill today:   Yes  He is out of insulin.  Requested medication (s) are on the active medication list:   Yes  Future visit scheduled:   No   He is over due and per note in chart has had a courtesy refill and must have a visit per Dr. Laural Benes for further refills.  I attempted to make him an appt but nothing available until March.   Can someone contact him for a sooner appt since he is out of insulin?   Thanks.      Last ordered: 09/17/2021 15 ml, 0 refills   Requested Prescriptions  Pending Prescriptions Disp Refills   insulin aspart (NOVOLOG FLEXPEN) 100 UNIT/ML FlexPen 15 mL 0    Sig: INJECT 15 UNITS INTO THE SKIN 3 (THREE) TIMES DAILY WITH MEALS.     Endocrinology:  Diabetes - Insulins Failed - 10/25/2021  8:57 AM      Failed - HBA1C is between 0 and 7.9 and within 180 days    HbA1c, POC (controlled diabetic range)  Date Value Ref Range Status  10/16/2020 12.7 (A) 0.0 - 7.0 % Final   Hgb A1c MFr Bld  Date Value Ref Range Status  12/17/2020 8.7 (H) 4.8 - 5.6 % Final    Comment:    (NOTE) Pre diabetes:          5.7%-6.4%  Diabetes:              >6.4%  Glycemic control for   <7.0% adults with diabetes           Failed - Valid encounter within last 6 months    Recent Outpatient Visits           8 months ago Major depressive disorder, recurrent episode, moderate (HCC)   [facility] Jonah Blue B, MD   9 months ago Type 1 diabetes mellitus with hyperglycemia [facility])   [place] [facility] Marcine Matar, MD   1 year ago Type 1 diabetes mellitus with hyperglycemia [facility])   [place] [facility] Marcine Matar, MD   1 year ago Type 1 diabetes mellitus with hyperglycemia [facility])   [facility] [place], [place], [place]   2 years ago Diabetes mellitus type 1, uncontrolled, without complications [facility])   [place] [facility] Marcine Matar, MD

## 2021-10-29 ENCOUNTER — Encounter: Payer: Self-pay | Admitting: Nurse Practitioner

## 2021-10-29 ENCOUNTER — Other Ambulatory Visit: Payer: Self-pay | Admitting: Internal Medicine

## 2021-10-29 ENCOUNTER — Ambulatory Visit: Payer: Medicaid Other | Attending: Nurse Practitioner | Admitting: Nurse Practitioner

## 2021-10-29 ENCOUNTER — Other Ambulatory Visit: Payer: Self-pay

## 2021-10-29 DIAGNOSIS — N529 Male erectile dysfunction, unspecified: Secondary | ICD-10-CM

## 2021-10-29 DIAGNOSIS — R112 Nausea with vomiting, unspecified: Secondary | ICD-10-CM

## 2021-10-29 DIAGNOSIS — E785 Hyperlipidemia, unspecified: Secondary | ICD-10-CM | POA: Diagnosis not present

## 2021-10-29 DIAGNOSIS — Z1159 Encounter for screening for other viral diseases: Secondary | ICD-10-CM

## 2021-10-29 DIAGNOSIS — K3184 Gastroparesis: Secondary | ICD-10-CM

## 2021-10-29 DIAGNOSIS — E1065 Type 1 diabetes mellitus with hyperglycemia: Secondary | ICD-10-CM

## 2021-10-29 DIAGNOSIS — F321 Major depressive disorder, single episode, moderate: Secondary | ICD-10-CM

## 2021-10-29 MED ORDER — PROMETHAZINE HCL 25 MG PO TABS
25.0000 mg | ORAL_TABLET | Freq: Four times a day (QID) | ORAL | 1 refills | Status: DC | PRN
  Filled 2021-10-29: qty 10, 3d supply, fill #0

## 2021-10-29 MED ORDER — NOVOLOG FLEXPEN 100 UNIT/ML ~~LOC~~ SOPN
15.0000 [IU] | PEN_INJECTOR | Freq: Three times a day (TID) | SUBCUTANEOUS | 0 refills | Status: DC
  Filled 2021-10-29: qty 39, 86d supply, fill #0

## 2021-10-29 MED ORDER — OMEPRAZOLE 40 MG PO CPDR
40.0000 mg | DELAYED_RELEASE_CAPSULE | Freq: Every day | ORAL | 1 refills | Status: DC
  Filled 2021-10-29: qty 90, 90d supply, fill #0

## 2021-10-29 MED ORDER — INSULIN GLARGINE 100 UNIT/ML SOLOSTAR PEN
15.0000 [IU] | PEN_INJECTOR | Freq: Every day | SUBCUTANEOUS | 3 refills | Status: DC
  Filled 2021-10-29: qty 12, 80d supply, fill #0
  Filled 2022-03-07: qty 12, 80d supply, fill #1
  Filled 2022-03-31: qty 12, 80d supply, fill #2

## 2021-10-29 MED ORDER — SERTRALINE HCL 25 MG PO TABS
25.0000 mg | ORAL_TABLET | Freq: Every day | ORAL | 1 refills | Status: DC
  Filled 2021-10-29: qty 90, 90d supply, fill #0

## 2021-10-29 MED ORDER — TRUEPLUS 5-BEVEL PEN NEEDLES 31G X 8 MM MISC
11 refills | Status: DC
  Filled 2021-10-29: qty 100, 25d supply, fill #0
  Filled 2022-03-07: qty 100, 25d supply, fill #1

## 2021-10-29 NOTE — Progress Notes (Signed)
Virtual Visit via Telephone Note Due to national recommendations of social distancing due to COVID 19, telehealth visit is felt to be most appropriate for this patient at this time.  I discussed the limitations, risks, security and privacy concerns of performing an evaluation and management service by telephone and the availability of in person appointments. I also discussed with the patient that there may be a patient responsible charge related to this service. The patient expressed understanding and agreed to proceed.    I connected with Jonathan Porter on 10/29/21  at   9:10 AM EST  EDT by telephone and verified that I am speaking with the correct person using two identifiers.  Location of Patient: Private Residence   Location of Provider: [facility]    Persons participating in Telemedicine visit: Bertram Denver FNP-BC RANON COVEN    History of Present Illness: Telemedicine visit for: DM 2  DM 2 Poorly controlled. Will add Novolog sliding scale insulin to current regimen of Novolog 15 units TID and lantus 15 units daily. He states his blood glucose levels vary "it just depends on if I have taken my insulin".  Lipids not at goal. Will add high intensity statin today.  Lab Results  Component Value Date   HGBA1C 10.1 (H) 10/29/2021    Lab Results  Component Value Date   LDLCALC (H) 08/31/2007    248        Total Cholesterol/HDL:CHD Risk Coronary Heart Disease Risk Table                     Men   Women  1/2 Average Risk   3.4   3.3         Past Medical History:  Diagnosis Date   Anxiety    Depression    Diabetes mellitus without complication (HCC)    Gastroparesis    GERD (gastroesophageal reflux disease)     Past Surgical History:  Procedure Laterality Date   NO PAST SURGERIES     UPPER GASTROINTESTINAL ENDOSCOPY      Family History  Problem Relation Age of Onset   Thyroid disease Mother    Diabetes Maternal Aunt    Diabetes  Maternal Grandmother    Diabetes Maternal Grandfather    Thyroid disease Other    Colon cancer Neg Hx    Esophageal cancer Neg Hx    Stomach cancer Neg Hx    Rectal cancer Neg Hx     Social History   Socioeconomic History   Marital status: Single    Spouse name: Not on file   Number of children: Not on file   Years of education: Not on file   Highest education level: Not on file  Occupational History   Not on file  Tobacco Use   Smoking status: Every Day    Packs/day: 0.50    Years: 6.00    Pack years: 3.00    Types: Cigarettes   Smokeless tobacco: Never  Vaping Use   Vaping Use: Never used  Substance and Sexual Activity   Alcohol use: Yes    Comment: social    Drug use: Yes    Types: Marijuana   Sexual activity: Yes    Birth control/protection: Condom    Comment: stated used condom 80% of the time   Other Topics Concern   Not on file  Social History Narrative   Not on file   Social Determinants of Health   Financial  Resource Strain: Not on file  Food Insecurity: Not on file  Transportation Needs: Not on file  Physical Activity: Not on file  Stress: Not on file  Social Connections: Not on file     Observations/Objective: Awake, alert and oriented x 3   Review of Systems  Constitutional:  Negative for fever, malaise/fatigue and weight loss.  HENT: Negative.  Negative for nosebleeds.   Eyes: Negative.  Negative for blurred vision, double vision and photophobia.  Respiratory: Negative.  Negative for cough and shortness of breath.   Cardiovascular: Negative.  Negative for chest pain, palpitations and leg swelling.  Gastrointestinal:  Positive for heartburn and nausea. Negative for abdominal pain, blood in stool, constipation, diarrhea, melena and vomiting.  Musculoskeletal: Negative.  Negative for myalgias.  Neurological: Negative.  Negative for dizziness, focal weakness, seizures and headaches.  Psychiatric/Behavioral:  Positive for depression. Negative for  suicidal ideas. The patient is nervous/anxious.    Assessment and Plan: Diagnoses and all orders for this visit:  Type 1 diabetes mellitus with hyperglycemia (HCC) -     Hemoglobin A1c -     Microalbumin / creatinine urine ratio -     Discontinue: insulin aspart (NOVOLOG FLEXPEN) 100 UNIT/ML FlexPen; Inject 15 Units into the skin 3 (three) times daily with meals. -     insulin glargine (LANTUS) 100 UNIT/ML Solostar Pen; Inject 15 Units into the skin daily. -     Insulin Pen Needle (TRUEPLUS 5-BEVEL PEN NEEDLES) 31G X 8 MM MISC; USE AS DIRECTED UP TO FOUR TIMES DAILY -     insulin aspart (NOVOLOG FLEXPEN) 100 UNIT/ML FlexPen; Inject 15 Units into the skin 3 (three) times daily with meals. For blood sugars 0-150 give 0 units of insulin, 151-200 give 2 units of insulin, 201-250 give 4 units, 251-300 give 6 units, 301-350 give 8 units, 351-400 give 10 units,> 400 give 12 units and call M.D. Discussed hypoglycemia protocol. -     Ambulatory referral to Ophthalmology  Dyslipidemia, goal LDL below 100 -     rosuvastatin (CRESTOR) 20 MG tablet; Take 1 tablet (20 mg total) by mouth daily.  Need for hepatitis C screening test -     HCV Ab w Reflex to Quant PCR -     Interpretation:  Major depressive disorder, single episode, moderate (HCC) -     sertraline (ZOLOFT) 25 MG tablet; Take 1 tablet (25 mg total) by mouth daily.  Intractable nausea and vomiting -     promethazine (PHENERGAN) 25 MG tablet; Take 1 tablet (25 mg total) by mouth every 6 (six) hours as needed for nausea or vomiting.  Gastroparesis -     omeprazole (PRILOSEC) 40 MG capsule; Take 1 capsule (40 mg total) by mouth daily with breakfast.     Follow Up Instructions Return in about 3 months (around 01/26/2022).     I discussed the assessment and treatment plan with the patient. The patient was provided an opportunity to ask questions and all were answered. The patient agreed with the plan and demonstrated an understanding of the  instructions.   The patient was advised to call back or seek an in-person evaluation if the symptoms worsen or if the condition fails to improve as anticipated.  I provided 12 minutes of non-face-to-face time during this encounter including median intraservice time, reviewing previous notes, labs, imaging, medications and explaining diagnosis and management.  Claiborne Rigg, FNP-BC

## 2021-10-30 ENCOUNTER — Other Ambulatory Visit: Payer: Self-pay

## 2021-10-30 LAB — HEMOGLOBIN A1C
Est. average glucose Bld gHb Est-mCnc: 243 mg/dL
Hgb A1c MFr Bld: 10.1 % — ABNORMAL HIGH (ref 4.8–5.6)

## 2021-10-30 LAB — MICROALBUMIN / CREATININE URINE RATIO
Creatinine, Urine: 158.4 mg/dL
Microalb/Creat Ratio: 9 mg/g creat (ref 0–29)
Microalbumin, Urine: 14.4 ug/mL

## 2021-10-30 LAB — HCV AB W REFLEX TO QUANT PCR: HCV Ab: <0.1 s/co ratio (ref 0.0–0.9)

## 2021-10-30 LAB — HCV INTERPRETATION

## 2021-10-30 NOTE — Telephone Encounter (Signed)
Requested Prescriptions  Pending Prescriptions Disp Refills   sildenafil (VIAGRA) 50 MG tablet [Pharmacy Med Name: Sildenafil Citrate 50 MG Oral Tablet] 10 tablet 3    Sig: TAKE ONE TABLET BY MOUTH 30 MINUTES PRIOR TO SEXUAL INTERCOURSE AS NEEDED. LIMIT 1 IN 24 HOURS     Urology: Erectile Dysfunction Agents Passed - 10/29/2021  4:29 PM      Passed - AST in normal range and within 360 days    AST  Date Value Ref Range Status  10/03/2021 23 15 - 41 U/L Final         Passed - ALT in normal range and within 360 days    ALT  Date Value Ref Range Status  10/03/2021 20 0 - 44 U/L Final         Passed - Last BP in normal range    BP Readings from Last 1 Encounters:  10/04/21 138/85         Passed - Valid encounter within last 12 months    Recent Outpatient Visits          Yesterday Type 1 diabetes mellitus with hyperglycemia [facility])   [place] [facility] [place], [place] W, NP   8 months ago Major depressive disorder, recurrent episode, moderate (HCC)   [place] [facility] Jonah Blue B, MD   10 months ago Type 1 diabetes mellitus with hyperglycemia [facility])   [place] [facility] Marcine Matar, MD   1 year ago Type 1 diabetes mellitus with hyperglycemia [facility])   [facility] Marcine Matar, MD   1 year ago Type 1 diabetes mellitus with hyperglycemia [facility])   [facility] [place], Marzella Schlein, [place]      Future Appointments            In 3 months Laural Benes, Binnie Rail, MD [facility]

## 2021-10-31 ENCOUNTER — Encounter: Payer: Self-pay | Admitting: Nurse Practitioner

## 2021-10-31 MED ORDER — ROSUVASTATIN CALCIUM 20 MG PO TABS
20.0000 mg | ORAL_TABLET | Freq: Every day | ORAL | 0 refills | Status: DC
Start: 2021-10-31 — End: 2022-07-16

## 2021-10-31 MED ORDER — NOVOLOG FLEXPEN 100 UNIT/ML ~~LOC~~ SOPN
15.0000 [IU] | PEN_INJECTOR | Freq: Three times a day (TID) | SUBCUTANEOUS | 0 refills | Status: DC
  Filled 2022-01-28: qty 15, 34d supply, fill #0
  Filled 2022-02-27 – 2022-03-07 (×2): qty 15, 34d supply, fill #1
  Filled 2022-03-31: qty 15, 34d supply, fill #2

## 2021-11-08 ENCOUNTER — Telehealth: Payer: Self-pay

## 2021-11-08 NOTE — Telephone Encounter (Signed)
Contacted pt to go over lab results pt is aware and doesn't have any questions or concerns 

## 2021-11-14 ENCOUNTER — Telehealth: Payer: Self-pay | Admitting: Pharmacist

## 2021-11-14 ENCOUNTER — Ambulatory Visit: Payer: Medicaid Other | Attending: Internal Medicine | Admitting: Internal Medicine

## 2021-11-14 ENCOUNTER — Other Ambulatory Visit: Payer: Self-pay

## 2021-11-14 DIAGNOSIS — E1065 Type 1 diabetes mellitus with hyperglycemia: Secondary | ICD-10-CM | POA: Diagnosis not present

## 2021-11-14 DIAGNOSIS — F331 Major depressive disorder, recurrent, moderate: Secondary | ICD-10-CM

## 2021-11-14 DIAGNOSIS — G5603 Carpal tunnel syndrome, bilateral upper limbs: Secondary | ICD-10-CM

## 2021-11-14 MED ORDER — DEXCOM G6 RECEIVER DEVI
1.0000 | Freq: Every day | 0 refills | Status: DC
  Filled 2021-11-14: qty 1, 30d supply, fill #0
  Filled 2021-11-18: qty 1, 90d supply, fill #0

## 2021-11-14 MED ORDER — DEXCOM G6 TRANSMITTER MISC
1.0000 | Freq: Every day | 4 refills | Status: DC
Start: 2021-11-14 — End: 2022-08-12
  Filled 2021-11-14: qty 1, 30d supply, fill #0
  Filled 2021-11-18: qty 1, 90d supply, fill #0

## 2021-11-14 MED ORDER — DEXCOM G6 SENSOR MISC
1.0000 | Freq: Every day | 11 refills | Status: DC
Start: 2021-11-14 — End: 2022-08-12
  Filled 2021-11-14 – 2021-11-18 (×2): qty 3, 30d supply, fill #0
  Filled 2022-01-28: qty 3, 30d supply, fill #1

## 2021-11-14 NOTE — Telephone Encounter (Signed)
This patient has Medicaid and is on multiple daily doses of insulin. Can we initiate a PA for his Dexcom?

## 2021-11-14 NOTE — Progress Notes (Signed)
Patient ID: Jonathan Porter, male   DOB: 07/11/1991, 31 y.o.   MRN: [id number] Virtual Visit via Telephone Note  I connected with Jonathan Porter on 11/14/2021 at 3:06 PM by telephone and verified that I am speaking with the correct person using two identifiers  Location: Patient: home Provider: office  Participants: Myself Patient   I discussed the limitations, risks, security and privacy concerns of performing an evaluation and management service by telephone and the availability of in person appointments. I also discussed with the patient that there may be a patient responsible charge related to this service. The patient expressed understanding and agreed to proceed.   History of Present Illness: Patient with history of insulin-dependent diabetes, MDD, genital warts, tob dep, ED, marijuana use, recurrent abdominal pain likely due to gastroparesis even though gastric emptying study was normal.  I last saw him 02/2021.  Patient requested urgent care visit to discuss issues with his hands.  Patient complains of intermittent tingling and numbness in both hands times a few weeks.  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  He did a lot of lifting of frozen foods up to 80 pounds.  He also drove a forklift.  He worked for about a month. -He noticed that the numbness and tingling started happening mainly at nights but sometimes during the day.  Sometimes it involves the entire hands of the times just certain fingers. -He quit working about 2 weeks ago because he was not sure whether the issue with his hand was being caused by his work.  Diabetes: Last A1c was 10.1.  Reports that he checks his blood sugars intermittently but not as regularly as he is supposed to.  Reports some morning lows that wakes him at nights.  Blood sugar this a.m. was 216, last night at 11 PM it was 62. -Reports taking NovoLog 15 units with meals and glargine 15  units at bedtime more consistently. -He had seen the endocrinologist Dr. Leonette Monarch back in 2020 one time. I note in reviewing his chart that he has had some frequent ER visits last month for recurrent abdominal pain and vomiting.  He has had negative gastric emptying study in the past.  Depression: He has been seen at behavioral health urgent care with depression.  He tells me that he is doing okay on the Zoloft and taking it daily for the most part.  He does not have a counselor home he follows with regularly.   Outpatient Encounter Medications as of 11/14/2021  Medication Sig Note   Accu-Chek FastClix Lancets MISC Use to check blood sugar up to 3 times daily. 12/15/2020: LF 09/28/20   blood glucose meter kit and supplies KIT Dispense based on patient and insurance preference. Use up to four times daily as directed. (FOR ICD-9 250.00, 250.01).    Blood Glucose Monitoring Suppl (ACCU-CHEK GUIDE ME) w/Device KIT 1 kit by Does not apply route 3 (three) times daily. Use to check blood sugar up to 3 times daily.    dicyclomine (BENTYL) 20 MG tablet Take 1 tablet (20 mg total) by mouth every 8 (eight) hours as needed for spasms.    glucose blood (ACCU-CHEK GUIDE) test strip USE AS DIRECTED UP TO FOUR TIMES DAILY    insulin aspart (NOVOLOG FLEXPEN) 100 UNIT/ML FlexPen Inject 15 Units into the skin 3 (three) times daily with meals. For blood sugars 0-150 give 0 units of insulin, 151-200  give 2 units of insulin, 201-250 give 4 units, 251-300 give 6 units, 301-350 give 8 units, 351-400 give 10 units,> 400 give 12 units and call M.D. Discussed hypoglycemia protocol.    insulin glargine (LANTUS) 100 UNIT/ML Solostar Pen Inject 15 Units into the skin daily.    Insulin Pen Needle (TRUEPLUS 5-BEVEL PEN NEEDLES) 31G X 8 MM MISC USE AS DIRECTED UP TO FOUR TIMES DAILY    metoCLOPramide (REGLAN) 10 MG tablet Take 1 tablet 1/2-hour before meals (Patient not taking: Reported on 12/28/2020)    nicotine (NICODERM CQ -  DOSED IN MG/24 HOURS) 21 mg/24hr patch Place 1 patch (21 mg total) onto the skin daily. (Patient not taking: Reported on 09/27/2021)    omeprazole (PRILOSEC) 40 MG capsule Take 1 capsule (40 mg total) by mouth daily with breakfast.    promethazine (PHENERGAN) 25 MG suppository Place 1 suppository (25 mg total) rectally every 8 (eight) hours as needed for nausea or vomiting. (Patient not taking: Reported on 09/30/2021)    promethazine (PHENERGAN) 25 MG tablet Take 1 tablet (25 mg total) by mouth every 6 (six) hours as needed for nausea or vomiting.    rosuvastatin (CRESTOR) 20 MG tablet Take 1 tablet (20 mg total) by mouth daily.    sertraline (ZOLOFT) 25 MG tablet Take 1 tablet (25 mg total) by mouth daily.    sildenafil (VIAGRA) 50 MG tablet TAKE ONE TABLET BY MOUTH 30 MINUTES PRIOR TO SEXUAL INTERCOURSE AS NEEDED. LIMIT 1 IN 24 HOURS    [DISCONTINUED] sucralfate (CARAFATE) 1 g tablet Take 1 tablet (1 g total) by mouth 4 (four) times daily for 14 days. (Patient not taking: Reported on 02/23/2020)    Facility-Administered Encounter Medications as of 11/14/2021  Medication   0.9 %  sodium chloride infusion      Observations/Objective:  Lab Results  Component Value Date   WBC 9.8 10/03/2021   HGB 14.2 10/03/2021   HCT 41.6 10/03/2021   MCV 81.4 10/03/2021   PLT 331 10/03/2021     Chemistry      Component Value Date/Time   NA 138 10/03/2021 1515   K 3.5 10/03/2021 1515   CL 102 10/03/2021 1515   CO2 23 10/03/2021 1515   BUN 7 10/03/2021 1515   CREATININE 1.06 10/03/2021 1515   CREATININE 0.91 04/18/2016 1323      Component Value Date/Time   CALCIUM 9.4 10/03/2021 1515   ALKPHOS 84 10/03/2021 1515   AST 23 10/03/2021 1515   ALT 20 10/03/2021 1515   BILITOT 0.6 10/03/2021 1515     Depression screen [facility] 2/9 02/21/2021 01/01/2021 12/01/2019  Decreased Interest 2 2 0  Down, Depressed, Hopeless 2 2 0  PHQ - 2 Score 4 4 0  Altered sleeping 2 3 0  Tired, decreased energy 2 3 0  Change in  appetite 3 3 0  Feeling bad or failure about yourself  1 1 0  Trouble concentrating 2 3 0  Moving slowly or fidgety/restless 0 3 0  Suicidal thoughts 0 0 0  PHQ-9 Score 14 20 0  Some recent data might be hidden   Lab Results  Component Value Date   HGBA1C 10.1 (H) 10/29/2021    Assessment and Plan: 1. Bilateral carpal tunnel syndrome -Symptoms sound like carpal tunnel syndrome.  I went over with him what this diagnosis means and the symptoms associated with it.  May have been due to the type of employment he was doing.  I recommend trial of  cock-up wrist splint.  I had my CMA check to see if we had any available and we do not.  I will print a prescription for him to take to a medical supply store.  2. Type 1 diabetes mellitus with hyperglycemia ([place]) Stressed the importance of compliance with medication and follow-up.  He is agreeable to me referring him back to Dr. Leonette Monarch. We will try to get him the Dexcom device so that he is not having to stick himself several times a day which she has not been doing consistently. - Ambulatory referral to Endocrinology - Continuous Blood Gluc Receiver ([place]) DEVI; Use as directed  Dispense: 1 each; Refill: 0 - Continuous Blood Gluc Sensor (DEXCOM G6 SENSOR) MISC; Use as directed  Dispense: 3 each; Refill: 11 - Continuous Blood Gluc Transmit (DEXCOM G6 TRANSMITTER) MISC; Use as directed  Dispense: 1 each; Refill: 4  3. Major depressive disorder, recurrent episode, moderate ([place]) Inquired whether he would like for me to refer him to a therapist.  Patient wanted to hold off for now.  He feels he is on adequate dose of the Zoloft which is very low-dose of 25 mg.  He declines increase.   Follow Up Instructions: 2 mths   I discussed the assessment and treatment plan with the patient. The patient was provided an opportunity to ask questions and all were answered. The patient agreed with the plan and demonstrated an understanding of the  instructions.   The patient was advised to call back or seek an in-person evaluation if the symptoms worsen or if the condition fails to improve as anticipated.  I  Spent 20 minutes on this telephone encounter  This note has been created with Surveyor, quantity. Any transcriptional errors are unintentional.  Karle Plumber, MD

## 2021-11-15 NOTE — Telephone Encounter (Signed)
PA approved untiil 05/14/22

## 2021-11-15 NOTE — Telephone Encounter (Signed)
Dr. Laural Benes,   Forwarding you this so you are aware. Pt's PA has been approved for Dexcom. It looks like it's being filled at our pharmacy.

## 2021-11-18 ENCOUNTER — Other Ambulatory Visit: Payer: Self-pay

## 2021-11-20 ENCOUNTER — Other Ambulatory Visit: Payer: Self-pay

## 2022-01-28 ENCOUNTER — Ambulatory Visit: Payer: Medicaid Other | Admitting: Internal Medicine

## 2022-01-28 ENCOUNTER — Other Ambulatory Visit: Payer: Self-pay

## 2022-01-30 ENCOUNTER — Other Ambulatory Visit: Payer: Self-pay

## 2022-02-21 ENCOUNTER — Other Ambulatory Visit: Payer: Self-pay | Admitting: Internal Medicine

## 2022-02-21 DIAGNOSIS — N529 Male erectile dysfunction, unspecified: Secondary | ICD-10-CM

## 2022-02-24 ENCOUNTER — Ambulatory Visit: Payer: Medicaid Other | Admitting: Internal Medicine

## 2022-02-27 ENCOUNTER — Other Ambulatory Visit: Payer: Self-pay

## 2022-03-05 ENCOUNTER — Other Ambulatory Visit: Payer: Self-pay

## 2022-03-06 ENCOUNTER — Other Ambulatory Visit: Payer: Self-pay

## 2022-03-07 ENCOUNTER — Other Ambulatory Visit: Payer: Self-pay

## 2022-03-13 ENCOUNTER — Other Ambulatory Visit: Payer: Self-pay | Admitting: Internal Medicine

## 2022-03-13 DIAGNOSIS — N529 Male erectile dysfunction, unspecified: Secondary | ICD-10-CM

## 2022-03-31 ENCOUNTER — Other Ambulatory Visit: Payer: Self-pay | Admitting: Internal Medicine

## 2022-03-31 ENCOUNTER — Other Ambulatory Visit: Payer: Self-pay

## 2022-03-31 DIAGNOSIS — IMO0001 Reserved for inherently not codable concepts without codable children: Secondary | ICD-10-CM

## 2022-03-31 DIAGNOSIS — E1065 Type 1 diabetes mellitus with hyperglycemia: Secondary | ICD-10-CM

## 2022-04-01 ENCOUNTER — Other Ambulatory Visit: Payer: Self-pay

## 2022-04-01 MED ORDER — ACCU-CHEK GUIDE VI STRP
ORAL_STRIP | 0 refills | Status: DC
  Filled 2022-04-01: qty 100, 25d supply, fill #0

## 2022-04-01 MED ORDER — ACCU-CHEK FASTCLIX LANCETS MISC
0 refills | Status: DC
  Filled 2022-04-01: qty 102, 34d supply, fill #0

## 2022-04-01 NOTE — Telephone Encounter (Signed)
   Notes to clinic:  This warning comes up on these rx  One or more orders are associated with diagnosis Diabetes mellitus type 1, uncontrolled, without complications, which is clinically inactive.  Please assess.     Requested Prescriptions  Pending Prescriptions Disp Refills   Accu-Chek FastClix Lancets MISC 102 each 0    Sig: Use to check blood sugar up to 3 times daily.     Endocrinology: Diabetes - Testing Supplies Passed - 03/31/2022 10:21 AM      Passed - Valid encounter within last 12 months    Recent Outpatient Visits           4 months ago Bilateral carpal tunnel syndrome   [place] [facility] Jonah Blue B, MD   5 months ago Type 1 diabetes mellitus with hyperglycemia [facility])   [place] [facility] [place], Shea Stakes, NP   1 year ago Major depressive disorder, recurrent episode, moderate (HCC)   [place] [facility] Marcine Matar, MD   1 year ago Type 1 diabetes mellitus with hyperglycemia [facility])   [place] [facility] Marcine Matar, MD   1 year ago Type 1 diabetes mellitus with hyperglycemia [facility])   [place] [facility] Jonah Blue B, MD               glucose blood (ACCU-CHEK GUIDE) test strip 100 strip 0    Sig: USE AS DIRECTED UP TO FOUR TIMES DAILY     Endocrinology: Diabetes - Testing Supplies Passed - 03/31/2022 10:21 AM      Passed - Valid encounter within last 12 months    Recent Outpatient Visits           4 months ago Bilateral carpal tunnel syndrome   [place] Jonah Blue B, MD   5 months ago Type 1 diabetes mellitus with hyperglycemia [facility])   [place] [facility] [place], Shea Stakes, NP   1 year ago Major depressive disorder, recurrent episode, moderate (HCC)   [place] [facility] Marcine Matar, MD   1 year ago Type 1 diabetes mellitus with  hyperglycemia [facility])   [place] [facility] Wellness Marcine Matar, MD   1 year ago Type 1 diabetes mellitus with hyperglycemia [facility])   [place] [facility] Marcine Matar, MD

## 2022-04-02 ENCOUNTER — Other Ambulatory Visit: Payer: Self-pay | Admitting: Pharmacist

## 2022-04-02 ENCOUNTER — Other Ambulatory Visit: Payer: Self-pay

## 2022-04-02 MED ORDER — ACCU-CHEK SOFTCLIX LANCETS MISC
0 refills | Status: DC
  Filled 2022-04-02: qty 100, 33d supply, fill #0

## 2022-04-04 ENCOUNTER — Other Ambulatory Visit: Payer: Self-pay

## 2022-04-09 ENCOUNTER — Other Ambulatory Visit: Payer: Self-pay | Admitting: Internal Medicine

## 2022-04-09 DIAGNOSIS — N529 Male erectile dysfunction, unspecified: Secondary | ICD-10-CM

## 2022-05-01 ENCOUNTER — Other Ambulatory Visit: Payer: Self-pay | Admitting: Internal Medicine

## 2022-05-01 DIAGNOSIS — N529 Male erectile dysfunction, unspecified: Secondary | ICD-10-CM

## 2022-05-01 NOTE — Telephone Encounter (Signed)
Requested Prescriptions  Pending Prescriptions Disp Refills  . sildenafil (VIAGRA) 50 MG tablet [Pharmacy Med Name: Sildenafil Citrate 50 MG Oral Tablet] 30 tablet 0    Sig: TAKE 1 TABLET BY MOUTH ONCE DAILY 30  MINUTES  PRIOR  TO  SEXUAL  INTERCOURSE  AS  NEEDED  LIMIT  1  IN  24  HOURS     Urology: Erectile Dysfunction Agents Passed - 05/01/2022 11:52 AM      Passed - AST in normal range and within 360 days    AST  Date Value Ref Range Status  10/03/2021 23 15 - 41 U/L Final         Passed - ALT in normal range and within 360 days    ALT  Date Value Ref Range Status  10/03/2021 20 0 - 44 U/L Final         Passed - Last BP in normal range    BP Readings from Last 1 Encounters:  10/04/21 138/85         Passed - Valid encounter within last 12 months    Recent Outpatient Visits          5 months ago Bilateral carpal tunnel syndrome   [place] [facility] Jonah Blue B, MD   6 months ago Type 1 diabetes mellitus with hyperglycemia [facility])   [place] [facility] [place], Shea Stakes, NP   1 year ago Major depressive disorder, recurrent episode, moderate (HCC)   [place] [facility] Marcine Matar, MD   1 year ago Type 1 diabetes mellitus with hyperglycemia [facility])   [place] [facility] Marcine Matar, MD   1 year ago Type 1 diabetes mellitus with hyperglycemia [facility])   [place] [facility] Marcine Matar, MD

## 2022-05-02 ENCOUNTER — Other Ambulatory Visit: Payer: Self-pay

## 2022-05-02 ENCOUNTER — Other Ambulatory Visit: Payer: Self-pay | Admitting: Nurse Practitioner

## 2022-05-02 DIAGNOSIS — E1065 Type 1 diabetes mellitus with hyperglycemia: Secondary | ICD-10-CM

## 2022-05-02 NOTE — Telephone Encounter (Signed)
Requested medication (s) are due for refill today: yes  Requested medication (s) are on the active medication list: yes  Last refill:  10/31/21 - 05/14/22 #45 ml 0 refills  Future visit scheduled: no  Notes to clinic:  tapered dose . Do you want to refill Rx?     Requested Prescriptions  Pending Prescriptions Disp Refills   insulin aspart (NOVOLOG FLEXPEN) 100 UNIT/ML FlexPen 45 mL 0    Sig: Inject 15 Units into the skin 3 (three) times daily with meals. For blood sugars 0-150 give 0 units of insulin, 151-200 give 2 units of insulin, 201-250 give 4 units, 251-300 give 6 units, 301-350 give 8 units, 351-400 give 10 units,> 400 give 12 units and call M.D. Discussed hypoglycemia protocol.     Endocrinology:  Diabetes - Insulins Failed - 05/02/2022  8:57 AM      Failed - HBA1C is between 0 and 7.9 and within 180 days    HbA1c, POC (controlled diabetic range)  Date Value Ref Range Status  10/16/2020 12.7 (A) 0.0 - 7.0 % Final   Hgb A1c MFr Bld  Date Value Ref Range Status  10/29/2021 10.1 (H) 4.8 - 5.6 % Final    Comment:             Prediabetes: 5.7 - 6.4          Diabetes: >6.4          Glycemic control for adults with diabetes: <7.0          Passed - Valid encounter within last 6 months    Recent Outpatient Visits           5 months ago Bilateral carpal tunnel syndrome   [place] [facility] Jonah Blue B, MD   6 months ago Type 1 diabetes mellitus with hyperglycemia [facility])   [place] [facility] [place], Shea Stakes, NP   1 year ago Major depressive disorder, recurrent episode, moderate (HCC)   [place] [facility] Marcine Matar, MD   1 year ago Type 1 diabetes mellitus with hyperglycemia [facility])   [place] [facility] Marcine Matar, MD   1 year ago Type 1 diabetes mellitus with hyperglycemia [facility])   [place] [facility] And Wellness Marcine Matar, MD

## 2022-05-07 ENCOUNTER — Other Ambulatory Visit: Payer: Self-pay | Admitting: Nurse Practitioner

## 2022-05-07 ENCOUNTER — Other Ambulatory Visit: Payer: Self-pay

## 2022-05-07 DIAGNOSIS — E1065 Type 1 diabetes mellitus with hyperglycemia: Secondary | ICD-10-CM

## 2022-05-07 MED ORDER — NOVOLOG FLEXPEN 100 UNIT/ML ~~LOC~~ SOPN
15.0000 [IU] | PEN_INJECTOR | Freq: Three times a day (TID) | SUBCUTANEOUS | 0 refills | Status: DC
  Filled 2022-05-07 – 2022-05-08 (×2): qty 12, 28d supply, fill #0

## 2022-05-08 ENCOUNTER — Other Ambulatory Visit: Payer: Self-pay

## 2022-06-11 ENCOUNTER — Other Ambulatory Visit: Payer: Self-pay | Admitting: Internal Medicine

## 2022-06-11 DIAGNOSIS — E1065 Type 1 diabetes mellitus with hyperglycemia: Secondary | ICD-10-CM

## 2022-06-12 ENCOUNTER — Other Ambulatory Visit: Payer: Self-pay | Admitting: Pharmacist

## 2022-06-12 ENCOUNTER — Other Ambulatory Visit: Payer: Self-pay | Admitting: Internal Medicine

## 2022-06-12 ENCOUNTER — Other Ambulatory Visit: Payer: Self-pay

## 2022-06-12 DIAGNOSIS — E1065 Type 1 diabetes mellitus with hyperglycemia: Secondary | ICD-10-CM

## 2022-06-12 MED ORDER — NOVOLOG FLEXPEN 100 UNIT/ML ~~LOC~~ SOPN
15.0000 [IU] | PEN_INJECTOR | Freq: Three times a day (TID) | SUBCUTANEOUS | 0 refills | Status: DC
  Filled 2022-06-12: qty 12, 30d supply, fill #0

## 2022-06-12 NOTE — Addendum Note (Signed)
Addended by: Daisy Blossom, Annie Main L on: 06/12/2022 04:52 PM   Modules accepted: Orders

## 2022-06-12 NOTE — Telephone Encounter (Signed)
Patient called, left VM to return the call to the office re: refill request. Novolog Flexpen refill refused yesterday by Lurena Joiner, [facility] due to patient has no showed x 2 since last OV in Feb, 2023 and will need to have an office visit in order to have the medication refilled. If patient returns the call, schedule with the next available appointment with any provider.

## 2022-06-12 NOTE — Telephone Encounter (Signed)
Copied from [place] [phone]. Topic: General - Other >> Jun 12, 2022 12:09 PM Everette C wrote: Reason for CRM: The patient would like to speak with a member of clinical staff when possible about a potential appointment   The patient would also like to discuss the refill of their previously requested Rx #: [id number]  insulin aspart (NOVOLOG FLEXPEN) 100 UNIT/ML FlexPen [[id number]] prescription   Please contact further when possible

## 2022-06-12 NOTE — Addendum Note (Signed)
Addended by: Matilde Sprang on: 06/12/2022 04:47 PM   Modules accepted: Orders

## 2022-06-12 NOTE — Telephone Encounter (Signed)
Pt is calling to return NT call. Appt schedule 07/10/22

## 2022-06-12 NOTE — Telephone Encounter (Signed)
Requested medication (s) are due for refill today: Yes  Requested medication (s) are on the active medication list:Yes  Last refill:  05/07/22  Future visit scheduled: Yes  Notes to clinic:  Unable to refill per protocol, appointment needed.      Requested Prescriptions  Pending Prescriptions Disp Refills   insulin aspart (NOVOLOG FLEXPEN) 100 UNIT/ML FlexPen 12 mL 0    Sig: Inject 15 Units into the skin 3 (three) times daily with meals. For blood sugars 0-150 give 0 units of insulin, 151-200 give 2 units of insulin, 201-250 give 4 units, 251-300 give 6 units, 301-350 give 8 units, 351-400 give 10 units,> 400 give 12 units and call M.D. Discussed hypoglycemia protocol.     Endocrinology:  Diabetes - Insulins Failed - 06/12/2022  4:47 PM      Failed - HBA1C is between 0 and 7.9 and within 180 days    HbA1c, POC (controlled diabetic range)  Date Value Ref Range Status  10/16/2020 12.7 (A) 0.0 - 7.0 % Final   Hgb A1c MFr Bld  Date Value Ref Range Status  10/29/2021 10.1 (H) 4.8 - 5.6 % Final    Comment:             Prediabetes: 5.7 - 6.4          Diabetes: >6.4          Glycemic control for adults with diabetes: <7.0          Failed - Valid encounter within last 6 months    Recent Outpatient Visits           7 months ago Bilateral carpal tunnel syndrome   [place] Karle Plumber B, MD   7 months ago Type 1 diabetes mellitus with hyperglycemia [facility])   [place], Vernia Buff, NP   1 year ago Major depressive disorder, recurrent episode, moderate ([place])   [place], Deborah B, MD   1 year ago Type 1 diabetes mellitus with hyperglycemia [facility])   [place], MD   1 year ago Type 1 diabetes mellitus with hyperglycemia [facility])   [place], MD       Future Appointments              In 1 month [place], Dionne Bucy, PA-C [place]

## 2022-06-13 ENCOUNTER — Other Ambulatory Visit: Payer: Self-pay

## 2022-06-15 ENCOUNTER — Encounter (HOSPITAL_COMMUNITY): Payer: Self-pay | Admitting: Emergency Medicine

## 2022-06-15 ENCOUNTER — Emergency Department (HOSPITAL_COMMUNITY)
Admission: EM | Admit: 2022-06-15 | Discharge: 2022-06-15 | Disposition: A | Discharge status: Home / Self Care | Payer: Medicaid Other | Attending: Emergency Medicine | Admitting: Emergency Medicine

## 2022-06-15 ENCOUNTER — Other Ambulatory Visit: Payer: Self-pay

## 2022-06-15 ENCOUNTER — Emergency Department (HOSPITAL_COMMUNITY): Payer: Medicaid Other

## 2022-06-15 DIAGNOSIS — N200 Calculus of kidney: Secondary | ICD-10-CM | POA: Diagnosis not present

## 2022-06-15 DIAGNOSIS — R109 Unspecified abdominal pain: Secondary | ICD-10-CM | POA: Diagnosis present

## 2022-06-15 DIAGNOSIS — E119 Type 2 diabetes mellitus without complications: Secondary | ICD-10-CM | POA: Insufficient documentation

## 2022-06-15 DIAGNOSIS — Z794 Long term (current) use of insulin: Secondary | ICD-10-CM | POA: Diagnosis not present

## 2022-06-15 LAB — CBC WITH DIFFERENTIAL/PLATELET
Abs Immature Granulocytes: 0.03 10*3/uL (ref 0.00–0.07)
Basophils Absolute: 0.1 10*3/uL (ref 0.0–0.1)
Basophils Relative: 1 %
Eosinophils Absolute: 0.8 10*3/uL — ABNORMAL HIGH (ref 0.0–0.5)
Eosinophils Relative: 8 %
HCT: 39.8 % (ref 39.0–52.0)
Hemoglobin: 13.4 g/dL (ref 13.0–17.0)
Immature Granulocytes: 0 %
Lymphocytes Relative: 56 %
Lymphs Abs: 5.3 10*3/uL — ABNORMAL HIGH (ref 0.7–4.0)
MCH: 28 pg (ref 26.0–34.0)
MCHC: 33.7 g/dL (ref 30.0–36.0)
MCV: 83.1 fL (ref 80.0–100.0)
Monocytes Absolute: 0.7 10*3/uL (ref 0.1–1.0)
Monocytes Relative: 7 %
Neutro Abs: 2.7 10*3/uL (ref 1.7–7.7)
Neutrophils Relative %: 28 %
Platelets: 283 10*3/uL (ref 150–400)
RBC: 4.79 MIL/uL (ref 4.22–5.81)
RDW: 13.8 % (ref 11.5–15.5)
WBC: 9.7 10*3/uL (ref 4.0–10.5)
nRBC: 0 % (ref 0.0–0.2)

## 2022-06-15 LAB — URINALYSIS, ROUTINE W REFLEX MICROSCOPIC
Bilirubin Urine: NEGATIVE
Glucose, UA: 50 mg/dL — AB
Ketones, ur: 20 mg/dL — AB
Leukocytes,Ua: NEGATIVE
Nitrite: NEGATIVE
Protein, ur: 30 mg/dL — AB
RBC / HPF: >50 RBC/hpf — ABNORMAL HIGH (ref 0–5)
Specific Gravity, Urine: 1.023 (ref 1.005–1.030)
pH: 5 (ref 5.0–8.0)

## 2022-06-15 LAB — COMPREHENSIVE METABOLIC PANEL
ALT: 20 U/L (ref 0–44)
AST: 22 U/L (ref 15–41)
Albumin: 3.7 g/dL (ref 3.5–5.0)
Alkaline Phosphatase: 71 U/L (ref 38–126)
Anion gap: 8 (ref 5–15)
BUN: 20 mg/dL (ref 6–20)
CO2: 25 mmol/L (ref 22–32)
Calcium: 9 mg/dL (ref 8.9–10.3)
Chloride: 108 mmol/L (ref 98–111)
Creatinine, Ser: 1.37 mg/dL — ABNORMAL HIGH (ref 0.61–1.24)
GFR, Estimated: >60 mL/min (ref >60)
Glucose, Bld: 136 mg/dL — ABNORMAL HIGH (ref 70–99)
Potassium: 3.5 mmol/L (ref 3.5–5.1)
Sodium: 141 mmol/L (ref 135–145)
Total Bilirubin: 0.5 mg/dL (ref 0.3–1.2)
Total Protein: 6.7 g/dL (ref 6.5–8.1)

## 2022-06-15 MED ORDER — ONDANSETRON 4 MG PO TBDP: 4.0000 mg | ORAL_TABLET | Freq: Three times a day (TID) | ORAL | 0 refills | Status: DC | PRN

## 2022-06-15 MED ORDER — KETOROLAC TROMETHAMINE 15 MG/ML IJ SOLN
15.0000 mg | Freq: Once | INTRAMUSCULAR | Status: AC
  Administered 2022-06-15: 15 mg via INTRAVENOUS
  Filled 2022-06-15: qty 1

## 2022-06-15 MED ORDER — ONDANSETRON HCL 4 MG/2ML IJ SOLN
4.0000 mg | Freq: Once | INTRAMUSCULAR | Status: AC
  Administered 2022-06-15: 4 mg via INTRAVENOUS
  Filled 2022-06-15: qty 2

## 2022-06-15 MED ORDER — HYDROMORPHONE HCL 1 MG/ML IJ SOLN
1.0000 mg | Freq: Once | INTRAMUSCULAR | Status: AC
  Administered 2022-06-15: 1 mg via INTRAVENOUS
  Filled 2022-06-15: qty 1

## 2022-06-15 MED ORDER — SODIUM CHLORIDE 0.9 % IV BOLUS
500.0000 mL | Freq: Once | INTRAVENOUS | Status: AC
  Administered 2022-06-15: 500 mL via INTRAVENOUS

## 2022-06-15 MED ORDER — SODIUM CHLORIDE 0.9 % IV BOLUS
1000.0000 mL | Freq: Once | INTRAVENOUS | Status: AC
  Administered 2022-06-15: 1000 mL via INTRAVENOUS

## 2022-06-15 MED ORDER — OXYCODONE-ACETAMINOPHEN 5-325 MG PO TABS: 1.0000 | ORAL_TABLET | Freq: Four times a day (QID) | ORAL | 0 refills | Status: DC | PRN

## 2022-06-15 MED ORDER — MORPHINE SULFATE (PF) 4 MG/ML IV SOLN
4.0000 mg | Freq: Once | INTRAVENOUS | Status: AC
  Administered 2022-06-15: 4 mg via INTRAVENOUS
  Filled 2022-06-15: qty 1

## 2022-06-15 MED ORDER — OXYCODONE-ACETAMINOPHEN 5-325 MG PO TABS
1.0000 | ORAL_TABLET | Freq: Once | ORAL | Status: AC
  Administered 2022-06-15: 1 via ORAL
  Filled 2022-06-15: qty 1

## 2022-06-15 MED ORDER — HYDROMORPHONE HCL 1 MG/ML IJ SOLN
0.5000 mg | Freq: Once | INTRAMUSCULAR | Status: AC
  Administered 2022-06-15: 0.5 mg via INTRAVENOUS
  Filled 2022-06-15: qty 1

## 2022-06-15 NOTE — ED Notes (Signed)
Patient provided with water per request 

## 2022-06-15 NOTE — ED Triage Notes (Signed)
Pt reports R flank pain that woke him up from sleep. Also states that his urine stream was weaker prior to going to bed.  CBG 160 18 LAC 172mcg fentanyl

## 2022-06-15 NOTE — ED Notes (Signed)
Patient resting quietly.  Reports pain has improved.  C/o pressure to lower abdomen.  Encourage to attempt to urinate.  Understanding voiced

## 2022-06-15 NOTE — Discharge Instructions (Addendum)
You were seen in the emergency department and found to have a kidney stone.  We are sending you home with the following medications to assist with passing the stone:  -Percocet-this is a narcotic/controlled substance medication that has potential addicting qualities.  We recommend that you take 1-2 tablets every 6 hours as needed for severe pain.  Do not drive or operate heavy machinery when taking this medicine as it can be sedating. Do not drink alcohol or take other sedating medications when taking this medicine for safety reasons.  Keep this out of reach of small children.  Please be aware this medicine has Tylenol in it (325 mg/tab) do not exceed the maximum dose of Tylenol in a day per over the counter recommendations should you decide to supplement with Tylenol over the counter.   -Zofran-this is an antinausea medication, you may take this every 8 hours as needed for nausea and vomiting, please allow the tablet to dissolve underneath of your tongue.   We have prescribed you new medication(s) today. Discuss the medications prescribed today with your pharmacist as they can have adverse effects and interactions with your other medicines including over the counter and prescribed medications. Seek medical evaluation if you start to experience new or abnormal symptoms after taking one of these medicines, seek care immediately if you start to experience difficulty breathing, feeling of your throat closing, facial swelling, or rash as these could be indications of a more serious allergic reaction  Your creatinine was mildly increased, this look at kidney function, this is likely due to the stone, however should be rechecked by urology or your primary care as should your blood pressure as it was elevated in the ED.   Please follow-up with the urology group provided in your discharge instructions within 3 to 5 days.  Return to the ER for new or worsening symptoms including but not limited to worsening pain  not controlled by these medicines, inability to keep fluids down, fever, or any other concerns that you may have.

## 2022-06-15 NOTE — ED Provider Notes (Signed)
[facility] DEPT Provider Note   CSN: [id number] Arrival date & time: 06/15/22  0130     History  Chief Complaint  Patient presents with   Flank Pain         Jonathan Porter is a 31 y.o. male with a history of diabetes mellitus, gastroparesis, anxiety, and depression who presents to the emergency department with complaints of right flank pain that woke him from sleep shortly PTA. Pain to right flank, radiates to right groin, severe, no alleviating/aggravating factors. Associated nausea and urge to urinate. Denies fever, vomiting, dysuria, hematuria, testicular pain/swelling, chest pain, or dyspnea.   HPI     Home Medications Prior to Admission medications   Medication Sig Start Date End Date Taking? Authorizing Provider  Accu-Chek Softclix Lancets lancets Use to check blood sugar three times daily. 04/02/22   Ladell Pier, MD  blood glucose meter kit and supplies KIT Dispense based on patient and insurance preference. Use up to four times daily as directed. (FOR ICD-9 250.00, 250.01). 12/01/19   Argentina Donovan, PA-C  Blood Glucose Monitoring Suppl (ACCU-CHEK GUIDE ME) w/Device KIT 1 kit by Does not apply route 3 (three) times daily. Use to check blood sugar up to 3 times daily. 06/03/19   Ladell Pier, MD  Continuous Blood Gluc Receiver (DEXCOM G6 RECEIVER) DEVI Use as directed 11/14/21   Ladell Pier, MD  Continuous Blood Gluc Sensor (DEXCOM G6 SENSOR) MISC Use as directed 11/14/21   Ladell Pier, MD  Continuous Blood Gluc Transmit (DEXCOM G6 TRANSMITTER) MISC Use as directed 11/14/21   Ladell Pier, MD  dicyclomine (BENTYL) 20 MG tablet Take 1 tablet (20 mg total) by mouth every 8 (eight) hours as needed for spasms. 09/30/21   Cox, Amalia Hailey, MD  glucose blood (ACCU-CHEK GUIDE) test strip USE AS DIRECTED UP TO FOUR TIMES DAILY 04/01/22 03/31/23  Ladell Pier, MD  insulin aspart (NOVOLOG FLEXPEN) 100 UNIT/ML FlexPen Inject  15 Units into the skin 3 (three) times daily with meals. For blood sugars 0-150 give 0 units of insulin, 151-200 give 2 units of insulin, 201-250 give 4 units, 251-300 give 6 units, 301-350 give 8 units, 351-400 give 10 units,> 400 give 12 units and call M.D. Discussed hypoglycemia protocol. 06/12/22 09/10/22  Ladell Pier, MD  insulin glargine (LANTUS) 100 UNIT/ML Solostar Pen Inject 15 Units into the skin daily. 10/29/21 05/26/22  Gildardo Pounds, NP  Insulin Pen Needle (TRUEPLUS 5-BEVEL PEN NEEDLES) 31G X 8 MM MISC USE AS DIRECTED UP TO FOUR TIMES DAILY 10/29/21   Gildardo Pounds, NP  metoCLOPramide (REGLAN) 10 MG tablet Take 1 tablet 1/2-hour before meals Patient not taking: Reported on 12/28/2020 12/27/20   Esterwood, Amy S, PA-C  nicotine (NICODERM CQ - DOSED IN MG/24 HOURS) 21 mg/24hr patch Place 1 patch (21 mg total) onto the skin daily. Patient not taking: Reported on 09/27/2021 02/21/21   Ladell Pier, MD  omeprazole (PRILOSEC) 40 MG capsule Take 1 capsule (40 mg total) by mouth daily with breakfast. 10/29/21 01/28/22  Gildardo Pounds, NP  promethazine (PHENERGAN) 25 MG suppository Place 1 suppository (25 mg total) rectally every 8 (eight) hours as needed for nausea or vomiting. Patient not taking: Reported on 09/30/2021 09/27/21   Isla Pence, MD  promethazine (PHENERGAN) 25 MG tablet Take 1 tablet (25 mg total) by mouth every 6 (six) hours as needed for nausea or vomiting. 10/29/21   Gildardo Pounds,  NP  rosuvastatin (CRESTOR) 20 MG tablet Take 1 tablet (20 mg total) by mouth daily. 10/31/21 01/29/22  Gildardo Pounds, NP  sertraline (ZOLOFT) 25 MG tablet Take 1 tablet (25 mg total) by mouth daily. 10/29/21 01/28/22  Gildardo Pounds, NP  sildenafil (VIAGRA) 50 MG tablet TAKE 1 TABLET BY MOUTH ONCE DAILY 30  MINUTES  PRIOR  TO  SEXUAL  INTERCOURSE  AS  NEEDED  LIMIT  1  IN  24  HOURS 05/01/22   Ladell Pier, MD  sucralfate (CARAFATE) 1 g tablet Take 1 tablet (1 g total) by mouth 4 (four) times  daily for 14 days. Patient not taking: Reported on 02/23/2020 10/01/19 02/23/20  McDonald, Mia A, PA-C      Allergies    Shellfish allergy    Review of Systems   Review of Systems  Constitutional:  Negative for chills and fever.  Respiratory:  Negative for shortness of breath.   Cardiovascular:  Negative for chest pain.  Gastrointestinal:  Positive for abdominal pain and nausea. Negative for diarrhea and vomiting.  Genitourinary:  Positive for difficulty urinating and flank pain. Negative for dysuria, scrotal swelling and testicular pain.  Neurological:  Negative for syncope.  All other systems reviewed and are negative.   Physical Exam Updated Vital Signs BP (!) 144/89   Pulse 78   Temp 97.6 F (36.4 C) (Oral)   Resp 16   SpO2 98%  Physical Exam Vitals and nursing note reviewed.  Constitutional:      General: He is in acute distress.     Appearance: He is well-developed. He is not toxic-appearing.  HENT:     Head: Normocephalic and atraumatic.  Eyes:     General:        Right eye: No discharge.        Left eye: No discharge.     Conjunctiva/sclera: Conjunctivae normal.  Cardiovascular:     Rate and Rhythm: Normal rate and regular rhythm.     Pulses:          Dorsalis pedis pulses are 2+ on the right side and 2+ on the left side.  Pulmonary:     Effort: No respiratory distress.     Breath sounds: Normal breath sounds. No wheezing or rales.  Abdominal:     General: There is no distension.     Palpations: Abdomen is soft.     Tenderness: There is no abdominal tenderness. There is no guarding or rebound.  Musculoskeletal:     Cervical back: Neck supple.  Skin:    General: Skin is warm and dry.  Neurological:     Mental Status: He is alert.     Comments: Clear speech.   Psychiatric:        Behavior: Behavior normal.     ED Results / Procedures / Treatments   Labs (all labs ordered are listed, but only abnormal results are displayed) Labs Reviewed - No data to  display  EKG None  Radiology CT Renal Stone Study  Result Date: 06/15/2022 CLINICAL DATA:  Right flank pain and difficulty urinating; kidney stones suspected EXAM: CT ABDOMEN AND PELVIS WITHOUT CONTRAST TECHNIQUE: Multidetector CT imaging of the abdomen and pelvis was performed following the standard protocol without IV contrast. RADIATION DOSE REDUCTION: This exam was performed according to the departmental dose-optimization program which includes automated exposure control, adjustment of the mA and/or kV according to patient size and/or use of iterative reconstruction technique. COMPARISON:  CT 09/24/2021 FINDINGS:  Lower chest: No acute abnormality. Hepatobiliary: No suspicious focal liver abnormality is seen. No gallstones, gallbladder wall thickening, or biliary dilatation. Pancreas: Unremarkable. No pancreatic ductal dilatation or surrounding inflammatory changes. Spleen: Normal in size without focal abnormality. Adrenals/Urinary Tract: Adrenal glands are unremarkable. Mild right hydroureteronephrosis with a 3 mm stone at the right ureterovesical junction. Bladder is nondistended. Stomach/Bowel: Stomach is within normal limits. No evidence of bowel wall thickening, distention, or inflammatory changes. The appendix is normal. Vascular/Lymphatic: No significant vascular findings are present. No enlarged abdominal or pelvic lymph nodes. Reproductive: Unremarkable. Other: No free intraperitoneal fluid or air. Musculoskeletal: No acute or significant osseous findings. IMPRESSION: 3 mm right UVJ stone with associated mild hydroureteronephrosis. Electronically Signed   By: Placido Sou M.D.   On: 06/15/2022 02:19    Procedures Procedures    Medications Ordered in ED Medications - No data to display  ED Course/ Medical Decision Making/ A&P                           Medical Decision Making Amount and/or Complexity of Data Reviewed Labs: ordered. Radiology: ordered.  Risk Prescription drug  management.  Patient presents to the ED with complaints of flank pain, this involves an extensive number of treatment options, and is a complaint that carries with it a high risk of complications and morbidity. Nontoxic, appears uncomfortable, vitals w/ HTN. Initially felt urge to urinate, but not voiding, checked bladder scan - 68m, will not cath at this time, continue to monitor.   Ddx including but not limited to: nephrolithiasis, pyelonephritis, cholecystitis, MSK pain, appendicitis.   Additional history obtained:  Chart/nursing notes reviewed  Lab Tests:  I viewed & interpreted labs including:  CBC: unremarkable CMP: mildly worsened creatinine UA: Pending  Imaging Studies:  I ordered and viewed the following imaging, agree with radiologist impression:  CT renal stone study: 3 mm right UVJ stone with associated mild hydroureteronephrosis.  ED Course:  Patient remained uncomfortable S/p 0.5 mg and subsequently 1 mg doses of dilaudid, subsequently gave morphine w/ improvement. Pain currently 1/10 severity. Remains pending UA, currently attempting to give specimen.   06:30: Patient care signed out to [place]@ shift change pending UA and disposition.   Portions of this note were generated with DLobbyist Dictation errors may occur despite best attempts at proofreading.   Final Clinical Impression(s) / ED Diagnoses Final diagnoses:  Kidney stone    Rx / DC Orders ED Discharge Orders     None         PAmaryllis Dyke PA-C [id number]   GDelora Fuel MD [id number] [phone]

## 2022-06-15 NOTE — ED Provider Notes (Signed)
  Physical Exam  BP (!) 148/107   Pulse 78   Temp 97.6 F (36.4 C) (Oral)   Resp 18   Ht 5\' 11"  (1.803 m)   Wt 97.5 kg   SpO2 100%   BMI 29.99 kg/m   Physical Exam Vitals and nursing note reviewed.  Constitutional:      Comments: Standing on the side of the bed, appears uncomfortable  HENT:     Head: Normocephalic and atraumatic.  Eyes:     Pupils: Pupils are equal, round, and reactive to light.  Cardiovascular:     Rate and Rhythm: Normal rate.  Pulmonary:     Effort: Pulmonary effort is normal.  Abdominal:     General: Abdomen is flat.  Musculoskeletal:     Cervical back: Normal range of motion and neck supple.  Skin:    General: Skin is warm and dry.  Neurological:     Mental Status: He is alert and oriented to person, place, and time.     Procedures  Procedures  ED Course / MDM   Clinical Course as of 06/15/22 0926  Sun Jun 15, 2022  0901 Hgb urine dipstick(!): LARGE [JS]  0901 WBC, UA: 11-20 [JS]  0901 Bacteria, UA(!): RARE [JS]    Clinical Course User Index [JS] Janeece Fitting, PA-C   Medical Decision Making Amount and/or Complexity of Data Reviewed Labs: ordered. Decision-making details documented in ED Course. Radiology: ordered.  Risk Prescription drug management.   Patient care assumed from [facility]. PA at shift change, please see her note for a full HPI. Briefly, patient here with right flank pain that woke him up from his sleep.Endorsing nausea and urge to urinate. CT shows 93mm right UVJ with mild hydrouteronephrosis. Given multiple rounds of medications. Plan is for patient to urinate, UA results and disposition home is pain is tolerated.  7:00 AM patient evaluated by me, he is currently standing at the side of the bed moaning in pain.  Creatinine was slightly elevated, will give 15 mg of Toradol for pain control.  UA has now been sent. 9:01 AM UA without any infection, rare bacteria, wbc 11-20, Hgb large. Non infectious.  Patient was  reevaluated for a second time continued to have pain, given additional Toradol.  Creatinine level was slightly elevated however he has had this in the past.  He was also given hydration while in the ED.  I did discuss with him urine results and he will likely go home with outpatient management.  9:26 AM patient reassessed by me after second round of Toradol.  He is reporting significant improvement in his pain.  Given also a Percocet orally prior to disposition from the emergency department.  Prior EDP has sent patient a prescription for Zofran along with pain medication.  He is advised to return to the emergency department if his condition worsens.   Portions of this note were generated with Lobbyist. Dictation errors may occur despite best attempts at proofreading.     Janeece Fitting, PA-C [id number]    Delora Fuel, MD 13/24/40 [phone]

## 2022-07-06 ENCOUNTER — Encounter: Payer: Self-pay | Admitting: Internal Medicine

## 2022-07-07 ENCOUNTER — Other Ambulatory Visit: Payer: Self-pay | Admitting: Pharmacist

## 2022-07-07 MED ORDER — ACCU-CHEK SOFTCLIX LANCETS MISC: 3 refills | Status: DC

## 2022-07-07 MED ORDER — ACCU-CHEK GUIDE W/DEVICE KIT: PACK | 0 refills | Status: DC

## 2022-07-07 MED ORDER — ACCU-CHEK GUIDE VI STRP: ORAL_STRIP | 3 refills | Status: DC

## 2022-07-16 ENCOUNTER — Other Ambulatory Visit: Payer: Self-pay

## 2022-07-16 ENCOUNTER — Telehealth (HOSPITAL_BASED_OUTPATIENT_CLINIC_OR_DEPARTMENT_OTHER): Payer: Medicaid Other | Admitting: Physician Assistant

## 2022-07-16 ENCOUNTER — Other Ambulatory Visit (HOSPITAL_BASED_OUTPATIENT_CLINIC_OR_DEPARTMENT_OTHER): Payer: Self-pay

## 2022-07-16 DIAGNOSIS — E785 Hyperlipidemia, unspecified: Secondary | ICD-10-CM | POA: Diagnosis not present

## 2022-07-16 DIAGNOSIS — E1065 Type 1 diabetes mellitus with hyperglycemia: Secondary | ICD-10-CM | POA: Diagnosis not present

## 2022-07-16 DIAGNOSIS — N529 Male erectile dysfunction, unspecified: Secondary | ICD-10-CM

## 2022-07-16 MED ORDER — INSULIN GLARGINE 100 UNIT/ML SOLOSTAR PEN
15.0000 [IU] | PEN_INJECTOR | Freq: Every day | SUBCUTANEOUS | 3 refills | Status: DC
  Filled 2022-07-16: qty 15, 90d supply, fill #0

## 2022-07-16 MED ORDER — SILDENAFIL CITRATE 50 MG PO TABS: ORAL_TABLET | ORAL | 0 refills | Status: DC

## 2022-07-16 MED ORDER — ACCU-CHEK GUIDE VI STRP
ORAL_STRIP | 3 refills | Status: DC
  Filled 2022-07-16: qty 100, 33d supply, fill #0

## 2022-07-16 MED ORDER — ACCU-CHEK GUIDE ME W/DEVICE KIT
PACK | 0 refills | Status: DC
  Filled 2022-07-16 – 2022-07-22 (×3): qty 1, 30d supply, fill #0

## 2022-07-16 MED ORDER — ROSUVASTATIN CALCIUM 20 MG PO TABS
20.0000 mg | ORAL_TABLET | Freq: Every day | ORAL | 1 refills | Status: DC
  Filled 2022-07-16: qty 90, 90d supply, fill #0

## 2022-07-16 MED ORDER — ACCU-CHEK SOFTCLIX LANCETS MISC
3 refills | Status: DC
  Filled 2022-07-16: qty 100, 33d supply, fill #0

## 2022-07-16 NOTE — Progress Notes (Signed)
Virtual Visit via Video Note  I connected with Jonathan Porter on 07/16/22 at  3:10 PM EDT by a video enabled telemedicine application and verified that I am speaking with the correct person using two identifiers.  Location: Patient: home Provider: [facility] office   I discussed the limitations of evaluation and management by telemedicine and the availability of in person appointments. The patient expressed understanding and agreed to proceed.  History of Present Illness:  has not gotten new glucometer bc it was sent to [facility] and he says too expensive.his meter broke about 1 month ago-he says prior to taht his blood sugars were about 90-150 since his last visit.  Admits to drinking too many sugary drinks and not following diabetic diet.  Compliant with novolog 15 tid with food and 15 u lantus daily.  Needs labs and agrees to come in tomorrow for labs.  Last A1C=10/2021=10.1    Observations/Objective:  NAD.  A&OX3   Assessment and Plan: 1. Type 1 diabetes mellitus with hyperglycemia (HCC) - insulin glargine (LANTUS) 100 UNIT/ML Solostar Pen; Inject 15 Units into the skin daily.  Dispense: 13.5 mL; Refill: 3 - Blood Glucose Monitoring Suppl (ACCU-CHEK GUIDE) w/Device KIT; Use to check blood sugar three times daily.  Dispense: 1 kit; Refill: 0 - Hemoglobin A1c; Future - glucose blood (ACCU-CHEK GUIDE) test strip; Use to check blood sugar three times daily.  Dispense: 100 each; Refill: 3 - Accu-Chek Softclix Lancets lancets; Use to check blood sugar three times daily.  Dispense: 100 each; Refill: 3 - Comprehensive metabolic panel; Future  2. Dyslipidemia, goal LDL below 100 - rosuvastatin (CRESTOR) 20 MG tablet; Take 1 tablet (20 mg total) by mouth daily.  Dispense: 90 tablet; Refill: 1 - Lipid panel; Future  3. Erectile dysfunction, unspecified erectile dysfunction type - sildenafil (VIAGRA) 50 MG tablet; Take 1 tablet on an empty stomach 1 hour before sexual activity prn daily  Dispense: 30  tablet; Refill: 0   Follow Up Instructions: Must come for labs within 2-3 weeks and see PCP in 3 months.     I discussed the assessment and treatment plan with the patient. The patient was provided an opportunity to ask questions and all were answered. The patient agreed with the plan and demonstrated an understanding of the instructions.   The patient was advised to call back or seek an in-person evaluation if the symptoms worsen or if the condition fails to improve as anticipated.  I provided 14 minutes of non-face-to-face time during this encounter.   Freeman Caldron, PA-C  Patient ID: Jonathan Porter, male   DOB: 1991-06-01, 31 y.o.   MRN: [id number]

## 2022-07-18 ENCOUNTER — Ambulatory Visit: Payer: Medicaid Other | Attending: Internal Medicine

## 2022-07-18 DIAGNOSIS — E1065 Type 1 diabetes mellitus with hyperglycemia: Secondary | ICD-10-CM

## 2022-07-18 DIAGNOSIS — E785 Hyperlipidemia, unspecified: Secondary | ICD-10-CM

## 2022-07-19 LAB — COMPREHENSIVE METABOLIC PANEL
ALT: 20 IU/L (ref 0–44)
AST: 18 IU/L (ref 0–40)
Albumin/Globulin Ratio: 1.5 (ref 1.2–2.2)
Albumin: 3.7 g/dL — ABNORMAL LOW (ref 4.1–5.1)
Alkaline Phosphatase: 114 IU/L (ref 44–121)
BUN/Creatinine Ratio: 10 (ref 9–20)
BUN: 12 mg/dL (ref 6–20)
Bilirubin Total: 0.3 mg/dL (ref 0.0–1.2)
CO2: 24 mmol/L (ref 20–29)
Calcium: 9.3 mg/dL (ref 8.7–10.2)
Chloride: 100 mmol/L (ref 96–106)
Creatinine, Ser: 1.26 mg/dL (ref 0.76–1.27)
Globulin, Total: 2.5 g/dL (ref 1.5–4.5)
Glucose: 364 mg/dL — ABNORMAL HIGH (ref 70–99)
Potassium: 4.7 mmol/L (ref 3.5–5.2)
Sodium: 139 mmol/L (ref 134–144)
Total Protein: 6.2 g/dL (ref 6.0–8.5)
eGFR: 77 mL/min/{1.73_m2} (ref >59)

## 2022-07-19 LAB — LIPID PANEL
Chol/HDL Ratio: 3.2 ratio (ref 0.0–5.0)
Cholesterol, Total: 167 mg/dL (ref 100–199)
HDL: 52 mg/dL (ref >39)
LDL Chol Calc (NIH): 103 mg/dL — ABNORMAL HIGH (ref 0–99)
Triglycerides: 64 mg/dL (ref 0–149)
VLDL Cholesterol Cal: 12 mg/dL (ref 5–40)

## 2022-07-19 LAB — HEMOGLOBIN A1C
Est. average glucose Bld gHb Est-mCnc: 212 mg/dL
Hgb A1c MFr Bld: 9 % — ABNORMAL HIGH (ref 4.8–5.6)

## 2022-07-21 ENCOUNTER — Other Ambulatory Visit: Payer: Self-pay | Admitting: Internal Medicine

## 2022-07-21 ENCOUNTER — Other Ambulatory Visit: Payer: Self-pay

## 2022-07-21 DIAGNOSIS — E1065 Type 1 diabetes mellitus with hyperglycemia: Secondary | ICD-10-CM

## 2022-07-22 ENCOUNTER — Other Ambulatory Visit: Payer: Self-pay

## 2022-07-22 MED ORDER — NOVOLOG FLEXPEN 100 UNIT/ML ~~LOC~~ SOPN
15.0000 [IU] | PEN_INJECTOR | Freq: Three times a day (TID) | SUBCUTANEOUS | 0 refills | Status: DC
  Filled 2022-07-22: qty 12, 30d supply, fill #0

## 2022-07-22 NOTE — Telephone Encounter (Signed)
Requested Prescriptions  Pending Prescriptions Disp Refills  . insulin aspart (NOVOLOG FLEXPEN) 100 UNIT/ML FlexPen 12 mL 0    Sig: Inject 15 Units into the skin 3 (three) times daily with meals. For blood sugars 0-150 give 0 units of insulin, 151-200 give 2 units of insulin, 201-250 give 4 units, 251-300 give 6 units, 301-350 give 8 units, 351-400 give 10 units,> 400 give 12 units and call M.D. Discussed hypoglycemia protocol.     Endocrinology:  Diabetes - Insulins Failed - 07/21/2022  2:30 PM      Failed - HBA1C is between 0 and 7.9 and within 180 days    HbA1c, POC (controlled diabetic range)  Date Value Ref Range Status  10/16/2020 12.7 (A) 0.0 - 7.0 % Final   Hgb A1c MFr Bld  Date Value Ref Range Status  07/18/2022 9.0 (H) 4.8 - 5.6 % Final    Comment:             Prediabetes: 5.7 - 6.4          Diabetes: >6.4          Glycemic control for adults with diabetes: <7.0          Passed - Valid encounter within last 6 months    Recent Outpatient Visits          6 days ago Type 1 diabetes mellitus with hyperglycemia [facility])   [place], [place], [place]   8 months ago Bilateral carpal tunnel syndrome   [place], Deborah B, MD   8 months ago Type 1 diabetes mellitus with hyperglycemia [facility])   [place], Vernia Buff, NP   1 year ago Major depressive disorder, recurrent episode, moderate ([place])   [place] Ladell Pier, MD   1 year ago Type 1 diabetes mellitus with hyperglycemia [facility])   [place] [facility] Ladell Pier, MD

## 2022-08-11 NOTE — Progress Notes (Unsigned)
Name: Jonathan Porter  MRN/ DOB: [id number], [birth year]   Age/ Sex: 31 y.o., male    PCP: Ladell Pier, MD   Reason for Endocrinology Evaluation: Type {NUMBERS 1 OR 2:522190} Diabetes Mellitus     Date of Initial Endocrinology Visit: 08/11/2022     PATIENT IDENTIFIER: Mr. Jonathan Porter is a 31 y.o. male with a past medical history of ***. The patient presented for initial endocrinology clinic visit on 08/11/2022 for consultative assistance with his diabetes management.    HPI: Mr. Burks was    Diagnosed with DM *** Prior Medications tried/Intolerance: *** Currently checking blood sugars *** x / day,  before breakfast and ***.  Hypoglycemia episodes : ***               Symptoms: ***                 Frequency: ***/  Hemoglobin A1c has ranged from 7.9% in 2020, peaking at 12.7% in 2022. Patient required assistance for hypoglycemia:  Patient has required hospitalization within the last 1 year from hyper or hypoglycemia:   In terms of diet, the patient ***   HOME DIABETES REGIMEN: Basal: ***  Bolus: ***   Statin: {Yes/No:11203} ACE-I/ARB: {YES/NO:17245} Prior Diabetic Education: {Yes/No:11203}   METER DOWNLOAD SUMMARY: Date range evaluated: *** Fingerstick Blood Glucose Tests = *** Average Number Tests/Day = *** Overall Mean FS Glucose = *** Standard Deviation = ***  BG Ranges: Low = *** High = ***   Hypoglycemic Events/30 Days: BG < 50 = *** Episodes of symptomatic severe hypoglycemia = ***   DIABETIC COMPLICATIONS: Microvascular complications:  *** Denies: *** Last eye exam: Completed   Macrovascular complications:  *** Denies: CAD, PVD, CVA   PAST HISTORY: Past Medical History:  Past Medical History:  Diagnosis Date   Anxiety    Depression    Diabetes mellitus without complication (HCC)    Gastroparesis    GERD (gastroesophageal reflux disease)    Past Surgical History:  Past Surgical History:  Procedure Laterality Date   NO PAST  SURGERIES     UPPER GASTROINTESTINAL ENDOSCOPY      Social History:  reports that he has been smoking cigarettes. He has a 3.00 pack-year smoking history. He has never used smokeless tobacco. He reports current alcohol use. He reports current drug use. Drug: Marijuana. Family History:  Family History  Problem Relation Age of Onset   Thyroid disease Mother    Diabetes Maternal Aunt    Diabetes Maternal Grandmother    Diabetes Maternal Grandfather    Thyroid disease Other    Colon cancer Neg Hx    Esophageal cancer Neg Hx    Stomach cancer Neg Hx    Rectal cancer Neg Hx      HOME MEDICATIONS: Allergies as of 08/12/2022       Reactions   Shellfish Allergy Anaphylaxis        Medication List        Accurate as of August 11, 2022 12:56 PM. If you have any questions, ask your nurse or doctor.          Accu-Chek Guide test strip Generic drug: glucose blood Use to check blood sugar three times daily.   Accu-Chek Guide w/Device Kit use to test.   Accu-Chek Softclix Lancets lancets Use to check blood sugar three times daily.   blood glucose meter kit and supplies Kit Dispense based on patient and insurance preference. Use up to four times daily  as directed. (FOR ICD-9 250.00, 250.01).   Dexcom G6 Receiver Devi Use as directed   Dexcom G6 Sensor Misc Use as directed   Dexcom G6 Transmitter Misc Use as directed   dicyclomine 20 MG tablet Commonly known as: BENTYL Take 1 tablet (20 mg total) by mouth every 8 (eight) hours as needed for spasms.   insulin glargine 100 UNIT/ML injection Commonly known as: LANTUS Inject 15 Units into the skin daily.   Lantus SoloStar 100 UNIT/ML Solostar Pen Generic drug: insulin glargine Inject 15 Units into the skin daily.   metoCLOPramide 10 MG tablet Commonly known as: REGLAN Take 1 tablet 1/2-hour before meals   nicotine 21 mg/24hr patch Commonly known as: NICODERM CQ - dosed in mg/24 hours Place 1 patch (21 mg  total) onto the skin daily.   NovoLOG FlexPen 100 UNIT/ML FlexPen Generic drug: insulin aspart Inject 15 Units into the skin 3 (three) times daily with meals. For blood sugars 0-150 give 0 units of insulin, 151-200 give 2 units of insulin, 201-250 give 4 units, 251-300 give 6 units, 301-350 give 8 units, 351-400 give 10 units,> 400 give 12 units and call M.D. Discussed hypoglycemia protocol.   omeprazole 40 MG capsule Commonly known as: PRILOSEC Take 1 capsule (40 mg total) by mouth daily with breakfast.   ondansetron 4 MG disintegrating tablet Commonly known as: ZOFRAN-ODT Take 1 tablet (4 mg total) by mouth every 8 (eight) hours as needed for nausea or vomiting.   oxyCODONE-acetaminophen 5-325 MG tablet Commonly known as: PERCOCET/ROXICET Take 1 tablet by mouth every 6 (six) hours as needed for severe pain.   promethazine 25 MG suppository Commonly known as: PHENERGAN Place 1 suppository (25 mg total) rectally every 8 (eight) hours as needed for nausea or vomiting.   promethazine 25 MG tablet Commonly known as: PHENERGAN Take 1 tablet (25 mg total) by mouth every 6 (six) hours as needed for nausea or vomiting.   rosuvastatin 20 MG tablet Commonly known as: Crestor Take 1 tablet (20 mg total) by mouth daily.   sildenafil 50 MG tablet Commonly known as: VIAGRA Take 1 tablet on an empty stomach 1 hour before sexual activity prn daily   TRUEplus 5-Bevel Pen Needles 31G X 8 MM Misc Generic drug: Insulin Pen Needle USE AS DIRECTED UP TO FOUR TIMES DAILY         ALLERGIES: Allergies  Allergen Reactions   Shellfish Allergy Anaphylaxis     REVIEW OF SYSTEMS: A comprehensive ROS was conducted with the patient and is negative except as per HPI and below:  ROS    OBJECTIVE:   VITAL SIGNS: There were no vitals taken for this visit.   PHYSICAL EXAM:  General: Pt appears well and is in NAD  Neck: General: Supple without adenopathy or carotid bruits. Thyroid: Thyroid  size normal.  No goiter or nodules appreciated.   Lungs: Clear with good BS bilat with no rales, rhonchi, or wheezes  Heart: RRR   Abdomen:  soft, nontender  Extremities:  Lower extremities - No pretibial edema. No lesions.  Skin: Normal texture and temperature to palpation. No rash noted.  Neuro: MS is good with appropriate affect, pt is alert and Ox3    DM foot exam:    DATA REVIEWED:  Lab Results  Component Value Date   HGBA1C 9.0 (H) 07/18/2022   HGBA1C 10.1 (H) 10/29/2021   HGBA1C 8.7 (H) 12/17/2020    Latest Reference Range & Units 07/18/22 13:40  Sodium 134 -  144 mmol/L 139  Potassium 3.5 - 5.2 mmol/L 4.7  Chloride 96 - 106 mmol/L 100  CO2 20 - 29 mmol/L 24  Glucose 70 - 99 mg/dL 364 (H)  BUN 6 - 20 mg/dL 12  Creatinine 0.76 - 1.27 mg/dL 1.26  Calcium 8.7 - 10.2 mg/dL 9.3  BUN/Creatinine Ratio 9 - 20  10  eGFR >59 mL/min/1.73 77  Alkaline Phosphatase 44 - 121 IU/L 114  Albumin 4.1 - 5.1 g/dL 3.7 (L)  Albumin/Globulin Ratio 1.2 - 2.2  1.5  AST 0 - 40 IU/L 18  ALT 0 - 44 IU/L 20  Total Protein 6.0 - 8.5 g/dL 6.2  Total Bilirubin 0.0 - 1.2 mg/dL 0.3    ASSESSMENT / PLAN / RECOMMENDATIONS:   1) Type 1 Diabetes Mellitus, ***controlled, With*** complications - Most recent A1c of ** %. Goal A1c < 7.0 %.    Plan: GENERAL: ***  MEDICATIONS: ***  EDUCATION / INSTRUCTIONS: BG monitoring instructions: Patient is instructed to check his blood sugars *** times a day, ***. Call [facility] if: BG persistently < 70  I reviewed the Rule of 15 for the treatment of hypoglycemia in detail with the patient. Literature supplied.   2) Diabetic complications:  Eye: Does *** have known diabetic retinopathy.  Neuro/ Feet: Does *** have known diabetic peripheral neuropathy. Renal: Patient does *** have known baseline CKD. He is *** on an ACEI/ARB at present.  3) Lipids: Patient is *** on a statin.    4) Hypertension: ***  at goal of < 140/90 mmHg.        Signed electronically by: Mack Guise, MD  [facility]  [facility] [address] [place], [place] [address] Phone: [phone] FAX: [phone]   CC: Ladell Pier, MD [address] [place] [address] Phone: [phone]  Fax: [phone]    Return to Endocrinology clinic as below: Future Appointments  Date Time Provider [place]  08/12/2022  8:10 AM Shiquan Mathieu, Melanie Crazier, MD [facility] None

## 2022-08-12 ENCOUNTER — Other Ambulatory Visit: Payer: Self-pay

## 2022-08-12 ENCOUNTER — Ambulatory Visit (INDEPENDENT_AMBULATORY_CARE_PROVIDER_SITE_OTHER): Payer: Medicaid Other | Admitting: Internal Medicine

## 2022-08-12 ENCOUNTER — Encounter: Payer: Self-pay | Admitting: Internal Medicine

## 2022-08-12 VITALS — BP 134/82 | HR 86 | Ht 71.0 in | Wt 233.0 lb

## 2022-08-12 DIAGNOSIS — E1042 Type 1 diabetes mellitus with diabetic polyneuropathy: Secondary | ICD-10-CM

## 2022-08-12 DIAGNOSIS — E1065 Type 1 diabetes mellitus with hyperglycemia: Secondary | ICD-10-CM | POA: Diagnosis not present

## 2022-08-12 LAB — POCT GLUCOSE (DEVICE FOR HOME USE): POC Glucose: 177 mg/dl — AB (ref 70–99)

## 2022-08-12 MED ORDER — ACCU-CHEK GUIDE ME W/DEVICE KIT
1.0000 | PACK | Freq: Three times a day (TID) | 0 refills | Status: DC
  Filled 2022-08-12: qty 1, 30d supply, fill #0

## 2022-08-12 MED ORDER — INSULIN PEN NEEDLE 32G X 4 MM MISC
1.0000 | Freq: Four times a day (QID) | 3 refills | Status: DC
  Filled 2022-08-12 – 2022-11-25 (×2): qty 100, 25d supply, fill #0

## 2022-08-12 MED ORDER — LANTUS SOLOSTAR 100 UNIT/ML ~~LOC~~ SOPN
22.0000 [IU] | PEN_INJECTOR | Freq: Every day | SUBCUTANEOUS | 6 refills | Status: DC
  Filled 2022-08-12: qty 15, 68d supply, fill #0

## 2022-08-12 MED ORDER — ACCU-CHEK GUIDE VI STRP
1.0000 | ORAL_STRIP | Freq: Three times a day (TID) | 3 refills | Status: DC
  Filled 2022-08-12: qty 300, 100d supply, fill #0

## 2022-08-12 MED ORDER — NOVOLOG FLEXPEN 100 UNIT/ML ~~LOC~~ SOPN
PEN_INJECTOR | SUBCUTANEOUS | 6 refills | Status: DC
  Filled 2022-08-12: qty 45, fill #0
  Filled 2022-08-18 – 2022-08-25 (×2): qty 45, 90d supply, fill #0
  Filled 2022-11-17 – 2022-11-25 (×2): qty 45, 90d supply, fill #1

## 2022-08-12 NOTE — Patient Instructions (Signed)
Increase Lantus 22 units daily  Continue Novolog 15 units with EACH meal  Novolog correctional insulin: ADD extra units on insulin to your meal-time Novolog dose if your blood sugars are higher than 165. Use the scale below to help guide you:   Blood sugar before meal Number of units to inject  Less than 165 0 unit  166 -  200 1 units  201 -  235 2 units  236 -  270 3 units  271 -  305 4 units  306 -  340 5 units  341 -  375 6 units  376 -  410 7 units  411 -  445 8 units   HOW TO TREAT LOW BLOOD SUGARS (Blood sugar LESS THAN 70 MG/DL) Please follow the RULE OF 15 for the treatment of hypoglycemia treatment (when your (blood sugars are less than 70 mg/dL)   STEP 1: Take 15 grams of carbohydrates when your blood sugar is low, which includes:  3-4 GLUCOSE TABS  OR 3-4 OZ OF JUICE OR REGULAR SODA OR ONE TUBE OF GLUCOSE GEL    STEP 2: RECHECK blood sugar in 15 MINUTES STEP 3: If your blood sugar is still low at the 15 minute recheck --> then, go back to STEP 1 and treat AGAIN with another 15 grams of carbohydrates.

## 2022-08-18 ENCOUNTER — Other Ambulatory Visit: Payer: Self-pay

## 2022-08-25 ENCOUNTER — Other Ambulatory Visit: Payer: Self-pay

## 2022-09-27 ENCOUNTER — Other Ambulatory Visit: Payer: Self-pay | Admitting: Physician Assistant

## 2022-09-27 DIAGNOSIS — N529 Male erectile dysfunction, unspecified: Secondary | ICD-10-CM

## 2022-09-29 NOTE — Telephone Encounter (Signed)
Requested Prescriptions  Pending Prescriptions Disp Refills   sildenafil (VIAGRA) 50 MG tablet [Pharmacy Med Name: Sildenafil Citrate 50 MG Oral Tablet] 30 tablet 0    Sig: TAKE ONE TABLET BY MOUTH ON AN EMPTY STOMACH 1 HOUR BEFORE SEXUAL ACTIVITY AS NEEDED     Urology: Erectile Dysfunction Agents Passed - 09/27/2022  2:55 PM      Passed - AST in normal range and within 360 days    AST  Date Value Ref Range Status  07/18/2022 18 0 - 40 IU/L Final         Passed - ALT in normal range and within 360 days    ALT  Date Value Ref Range Status  07/18/2022 20 0 - 44 IU/L Final         Passed - Last BP in normal range    BP Readings from Last 1 Encounters:  08/12/22 134/82         Passed - Valid encounter within last 12 months    Recent Outpatient Visits           2 months ago Type 1 diabetes mellitus with hyperglycemia [facility])   [place], [place], [place]   10 months ago Bilateral carpal tunnel syndrome   [place], [place]   11 months ago Type 1 diabetes mellitus with hyperglycemia [facility])   [place], Vernia Buff, NP   1 year ago Major depressive disorder, recurrent episode, moderate ([place])   [place] Ladell Pier, MD   1 year ago Type 1 diabetes mellitus with hyperglycemia [facility])   [place] [facility] Ladell Pier, MD

## 2022-10-24 ENCOUNTER — Other Ambulatory Visit: Payer: Self-pay | Admitting: Internal Medicine

## 2022-10-24 DIAGNOSIS — N529 Male erectile dysfunction, unspecified: Secondary | ICD-10-CM

## 2022-11-17 ENCOUNTER — Other Ambulatory Visit: Payer: Self-pay

## 2022-11-17 NOTE — Telephone Encounter (Signed)
Pt asked if it was possible to get a courtesy refill to get him through until his appointment.  Pt is scheduled for first available 03/13.  Please advise.

## 2022-11-17 NOTE — Addendum Note (Signed)
Addended by: Erie Noe on: 11/17/2022 12:38 PM   Modules accepted: Orders

## 2022-11-17 NOTE — Telephone Encounter (Signed)
Requested medication (s) are due for refill today: yes  Requested medication (s) are on the active medication list: yes  Last refill:  09/29/22 #30/0  Future visit scheduled: yes 12/03/22  Notes to clinic:  pt asking for courtesy refill prior to upcoming appt on 12/02/21.    Requested Prescriptions  Pending Prescriptions Disp Refills   sildenafil (VIAGRA) 50 MG tablet 30 tablet 0    Sig: TAKE ONE TABLET BY MOUTH ON AN EMPTY STOMACH 1 HOUR BEFORE SEXUAL ACTIVITY AS NEEDED     Urology: Erectile Dysfunction Agents Passed - 11/17/2022 12:38 PM      Passed - AST in normal range and within 360 days    AST  Date Value Ref Range Status  07/18/2022 18 0 - 40 IU/L Final         Passed - ALT in normal range and within 360 days    ALT  Date Value Ref Range Status  07/18/2022 20 0 - 44 IU/L Final         Passed - Last BP in normal range    BP Readings from Last 1 Encounters:  08/12/22 134/82         Passed - Valid encounter within last 12 months    Recent Outpatient Visits           4 months ago Type 1 diabetes mellitus with hyperglycemia [facility])   [place], [place], [place]   1 year ago Bilateral carpal tunnel syndrome   [place] Karle Plumber B, MD   1 year ago Type 1 diabetes mellitus with hyperglycemia [facility])   [place], [place] W, NP   1 year ago Major depressive disorder, recurrent episode, moderate ([place])   [place] Karle Plumber B, MD   1 year ago Type 1 diabetes mellitus with hyperglycemia [facility])   [place], MD       Future Appointments             In 2 weeks Thereasa Solo, Dionne Bucy, [place]            Refused Prescriptions Disp Refills   sildenafil (VIAGRA) 50 MG tablet [Pharmacy Med Name: Sildenafil  Citrate 50 MG Oral Tablet] 30 tablet 0    Sig: TAKE 1 TABLET BY MOUTH ONCE DAILY ON  AN  EMPTY  STOMACH  1  HOUR  BEFORE  SEXUAL  ACTIVITY  AS  NEEDED     Urology: Erectile Dysfunction Agents Passed - 11/17/2022 12:38 PM      Passed - AST in normal range and within 360 days    AST  Date Value Ref Range Status  07/18/2022 18 0 - 40 IU/L Final         Passed - ALT in normal range and within 360 days    ALT  Date Value Ref Range Status  07/18/2022 20 0 - 44 IU/L Final         Passed - Last BP in normal range    BP Readings from Last 1 Encounters:  08/12/22 134/82         Passed - Valid encounter within last 12 months    Recent Outpatient Visits           4 months ago Type 1 diabetes mellitus with hyperglycemia (  [facility])   [place], [place], [place]   1 year ago Bilateral carpal tunnel syndrome   [place], MD   1 year ago Type 1 diabetes mellitus with hyperglycemia [facility])   [place] Gildardo Pounds, NP   1 year ago Major depressive disorder, recurrent episode, moderate ([place])   [place] Karle Plumber B, MD   1 year ago Type 1 diabetes mellitus with hyperglycemia [facility])   [place] Ladell Pier, MD       Future Appointments             In 2 weeks Thereasa Solo, [place]

## 2022-11-19 MED ORDER — SILDENAFIL CITRATE 50 MG PO TABS: ORAL_TABLET | ORAL | 1 refills | Status: DC

## 2022-11-21 ENCOUNTER — Other Ambulatory Visit: Payer: Self-pay

## 2022-11-25 ENCOUNTER — Other Ambulatory Visit: Payer: Self-pay

## 2022-12-03 ENCOUNTER — Encounter: Payer: Self-pay | Admitting: Physician Assistant

## 2022-12-03 ENCOUNTER — Other Ambulatory Visit: Payer: Self-pay

## 2022-12-03 ENCOUNTER — Ambulatory Visit: Payer: Medicaid Other | Attending: Physician Assistant | Admitting: Physician Assistant

## 2022-12-03 VITALS — BP 149/80 | HR 115 | Wt 240.8 lb

## 2022-12-03 DIAGNOSIS — R030 Elevated blood-pressure reading, without diagnosis of hypertension: Secondary | ICD-10-CM | POA: Insufficient documentation

## 2022-12-03 DIAGNOSIS — Z79899 Other long term (current) drug therapy: Secondary | ICD-10-CM | POA: Insufficient documentation

## 2022-12-03 DIAGNOSIS — Z794 Long term (current) use of insulin: Secondary | ICD-10-CM | POA: Insufficient documentation

## 2022-12-03 DIAGNOSIS — N529 Male erectile dysfunction, unspecified: Secondary | ICD-10-CM | POA: Insufficient documentation

## 2022-12-03 DIAGNOSIS — E1065 Type 1 diabetes mellitus with hyperglycemia: Secondary | ICD-10-CM | POA: Diagnosis present

## 2022-12-03 DIAGNOSIS — E785 Hyperlipidemia, unspecified: Secondary | ICD-10-CM | POA: Diagnosis not present

## 2022-12-03 LAB — POCT GLYCOSYLATED HEMOGLOBIN (HGB A1C): HbA1c, POC (controlled diabetic range): 9.5 % — AB (ref 0.0–7.0)

## 2022-12-03 LAB — GLUCOSE, POCT (MANUAL RESULT ENTRY): POC Glucose: 62 mg/dl — AB (ref 70–99)

## 2022-12-03 MED ORDER — ACCU-CHEK GUIDE ME W/DEVICE KIT
1.0000 | PACK | Freq: Three times a day (TID) | 0 refills | Status: DC
  Filled 2022-12-03 – 2022-12-12 (×2): qty 1, 30d supply, fill #0

## 2022-12-03 MED ORDER — LANTUS SOLOSTAR 100 UNIT/ML ~~LOC~~ SOPN
25.0000 [IU] | PEN_INJECTOR | Freq: Every day | SUBCUTANEOUS | 6 refills | Status: DC
  Filled 2022-12-03 – 2022-12-12 (×2): qty 15, 60d supply, fill #0
  Filled 2023-02-09: qty 15, 60d supply, fill #1

## 2022-12-03 MED ORDER — SILDENAFIL CITRATE 50 MG PO TABS: ORAL_TABLET | ORAL | 1 refills | Status: DC

## 2022-12-03 MED ORDER — ACCU-CHEK GUIDE VI STRP
1.0000 | ORAL_STRIP | Freq: Three times a day (TID) | 3 refills | Status: DC
  Filled 2022-12-03 – 2022-12-12 (×2): qty 300, 100d supply, fill #0
  Filled 2023-02-09 – 2023-05-17 (×8): qty 300, 100d supply, fill #1
  Filled 2023-05-29: qty 200, 67d supply, fill #1

## 2022-12-03 MED ORDER — LISINOPRIL 5 MG PO TABS
5.0000 mg | ORAL_TABLET | Freq: Every day | ORAL | 3 refills | Status: DC
  Filled 2022-12-03 – 2022-12-12 (×2): qty 90, 90d supply, fill #0

## 2022-12-03 MED ORDER — INSULIN PEN NEEDLE 32G X 4 MM MISC
1.0000 | Freq: Four times a day (QID) | 3 refills | Status: DC
  Filled 2022-12-03: qty 400, 100d supply, fill #0
  Filled 2023-02-09: qty 300, 75d supply, fill #0

## 2022-12-03 MED ORDER — ROSUVASTATIN CALCIUM 20 MG PO TABS
20.0000 mg | ORAL_TABLET | Freq: Every day | ORAL | 1 refills | Status: DC
  Filled 2022-12-03 – 2022-12-12 (×2): qty 90, 90d supply, fill #0

## 2022-12-03 MED ORDER — ACCU-CHEK SOFTCLIX LANCETS MISC
3 refills | Status: DC
  Filled 2022-12-03: qty 100, 100d supply, fill #0
  Filled 2022-12-12: qty 100, 33d supply, fill #0
  Filled 2023-02-09 – 2023-03-02 (×2): qty 100, 33d supply, fill #1
  Filled 2023-05-17: qty 100, 34d supply, fill #1
  Filled 2023-05-29: qty 100, 33d supply, fill #1

## 2022-12-03 MED ORDER — NOVOLOG FLEXPEN 100 UNIT/ML ~~LOC~~ SOPN
12.0000 [IU] | PEN_INJECTOR | Freq: Three times a day (TID) | SUBCUTANEOUS | 6 refills | Status: DC
  Filled 2022-12-03: qty 45, fill #0
  Filled 2023-02-09: qty 30, 84d supply, fill #0

## 2022-12-03 NOTE — Patient Instructions (Signed)
Check blood sugars fasting and at bedtime and record.  Bring readings to your next visit.

## 2022-12-03 NOTE — Progress Notes (Unsigned)
Patient ID: Jonathan Porter, male   DOB: 11/18/1990, 32 y.o.   MRN: [id number]   Jonathan Porter, is a 32 y.o. male  [id number]  [id number]  DOB - Oct 18, 1990  No chief complaint on file.      Subjective:   Jonathan Porter is a 32 y.o. male here today for med RF.  Not checking sugars-says he has never been ordered a meter but I see several that have been sent.  He is taking 20 u lantus and 20 u novolog tid with food.  Sometimes he skips doses if he "feel like I don't need it."  He goes on feelings not numbers.  No new issues or concerns  No problems updated.  ALLERGIES: Allergies  Allergen Reactions   Shellfish Allergy Anaphylaxis    PAST MEDICAL HISTORY: Past Medical History:  Diagnosis Date   Anxiety    Depression    Diabetes mellitus without complication (HCC)    Gastroparesis    GERD (gastroesophageal reflux disease)     MEDICATIONS AT HOME: Prior to Admission medications   Medication Sig Start Date End Date Taking? Authorizing Provider  lisinopril (ZESTRIL) 5 MG tablet Take 1 tablet (5 mg total) by mouth daily. 12/03/22  Yes Argentina Donovan, PA-C  Accu-Chek Softclix Lancets lancets Use to check blood sugar three times daily. 12/03/22   Argentina Donovan, PA-C  blood glucose meter kit and supplies KIT Dispense based on patient and insurance preference. Use up to four times daily as directed. (FOR ICD-9 250.00, 250.01). Patient not taking: Reported on 08/12/2022 12/01/19   Argentina Donovan, PA-C  Blood Glucose Monitoring Suppl (ACCU-CHEK GUIDE ME) w/Device KIT 1 Device by Does not apply route 3 (three) times daily. 12/03/22   Argentina Donovan, PA-C  Blood Glucose Monitoring Suppl (ACCU-CHEK GUIDE ME) w/Device KIT use to test. Patient not taking: Reported on 08/12/2022 07/16/22   Argentina Donovan, PA-C  dicyclomine (BENTYL) 20 MG tablet Take 1 tablet (20 mg total) by mouth every 8 (eight) hours as needed for spasms. Patient not taking: Reported on 06/15/2022 09/30/21    Cox, Amalia Hailey, MD  glucose blood (ACCU-CHEK GUIDE) test strip Use 3 (three) times daily. Use to check blood sugar three times daily. 12/03/22   Argentina Donovan, PA-C  insulin aspart (NOVOLOG FLEXPEN) 100 UNIT/ML FlexPen Use 12 units with meals up to 3 times daily 12/03/22   Freeman Caldron M, PA-C  insulin glargine (LANTUS SOLOSTAR) 100 UNIT/ML Solostar Pen Inject 25 Units into the skin daily. 12/03/22   Argentina Donovan, PA-C  Insulin Pen Needle 32G X 4 MM MISC use as directed in the morning, at noon, in the evening, and at bedtime to inject insulin 12/03/22   Freeman Caldron M, PA-C  metoCLOPramide (REGLAN) 10 MG tablet Take 1 tablet 1/2-hour before meals Patient not taking: Reported on 12/28/2020 12/27/20   [place], Amy S, PA-C  omeprazole (PRILOSEC) 40 MG capsule Take 1 capsule (40 mg total) by mouth daily with breakfast. 10/29/21 01/28/22  Gildardo Pounds, NP  ondansetron (ZOFRAN-ODT) 4 MG disintegrating tablet Take 1 tablet (4 mg total) by mouth every 8 (eight) hours as needed for nausea or vomiting. Patient not taking: Reported on 08/12/2022 06/15/22   Petrucelli, Glynda Jaeger, PA-C  promethazine (PHENERGAN) 25 MG suppository Place 1 suppository (25 mg total) rectally every 8 (eight) hours as needed for nausea or vomiting. Patient not taking: Reported on 09/30/2021 09/27/21   Isla Pence, MD  promethazine (PHENERGAN) 25 MG  tablet Take 1 tablet (25 mg total) by mouth every 6 (six) hours as needed for nausea or vomiting. Patient not taking: Reported on 06/15/2022 10/29/21   Gildardo Pounds, NP  rosuvastatin (CRESTOR) 20 MG tablet Take 1 tablet (20 mg total) by mouth daily. 12/03/22 03/03/23  Argentina Donovan, PA-C  sildenafil (VIAGRA) 50 MG tablet TAKE ONE TABLET BY MOUTH ON AN EMPTY STOMACH 1 HOUR BEFORE SEXUAL ACTIVITY AS NEEDED 12/03/22   Ami Thornsberry, Dionne Bucy, PA-C    ROS: Neg HEENT Neg resp Neg cardiac Neg GI Neg GU Neg MS Neg psych Neg neuro  Objective:   Vitals:   12/03/22 1625  BP: (!)  149/80  Pulse =115 Pulse ox 98% Weight=240  Exam General appearance : Awake, alert, not in any distress. Speech Clear. Not toxic looking HEENT: Atraumatic and Normocephalic, Neck: Supple, no JVD. No cervical lymphadenopathy.  Chest: Good air entry bilaterally, CTAB.  No rales/rhonchi/wheezing CVS: S1 S2 regular, no murmurs. Rate 96 on exam Extremities: B/L Lower Ext shows no edema, both legs are warm to touch Neurology: Awake alert, and oriented X 3, CN II-XII intact, Non focal Skin: No Rash  Data Review Lab Results  Component Value Date   HGBA1C 9.5 (A) 12/03/2022   HGBA1C 9.0 (H) 07/18/2022   HGBA1C 10.1 (H) 10/29/2021    Assessment & Plan   1. Type 1 diabetes mellitus with hyperglycemia (HCC) Uncontrolled.  Meter sent!!! Check blood sugars fasting and at bedtime and record.  Bring readings to your next visit.   - Glucose (CBG) - POCT glycosylated hemoglobin (Hb A1C) - Blood Glucose Monitoring Suppl (ACCU-CHEK GUIDE ME) w/Device KIT; 1 Device by Does not apply route 3 (three) times daily.  Dispense: 1 kit; Refill: 0 - glucose blood (ACCU-CHEK GUIDE) test strip; Use 3 (three) times daily. Use to check blood sugar three times daily.  Dispense: 300 each; Refill: 3 - insulin glargine (LANTUS SOLOSTAR) 100 UNIT/ML Solostar Pen; Inject 25 Units into the skin daily.  Dispense: 15 mL; Refill: 6 - insulin aspart (NOVOLOG FLEXPEN) 100 UNIT/ML FlexPen; Use 12 units with meals up to 3 times daily  Dispense: 45 mL; Refill: 6 - Accu-Chek Softclix Lancets lancets; Use to check blood sugar three times daily.  Dispense: 100 each; Refill: 3 - lisinopril (ZESTRIL) 5 MG tablet; Take 1 tablet (5 mg total) by mouth daily.  Dispense: 90 tablet; Refill: 3  2. Dyslipidemia, goal LDL below 100 - rosuvastatin (CRESTOR) 20 MG tablet; Take 1 tablet (20 mg total) by mouth daily.  Dispense: 90 tablet; Refill: 1  3. Erectile dysfunction, unspecified erectile dysfunction type - sildenafil (VIAGRA) 50 MG  tablet; TAKE ONE TABLET BY MOUTH ON AN EMPTY STOMACH 1 HOUR BEFORE SEXUAL ACTIVITY AS NEEDED  Dispense: 30 tablet; Refill: 1  4. Elevated blood pressure reading - lisinopril (ZESTRIL) 5 MG tablet; Take 1 tablet (5 mg total) by mouth daily.  Dispense: 90 tablet; Refill: 3 For kidney protection and elevated BP    Return in about 4 weeks (around 12/31/2022) for BP and DM check with Lurena Joiner and 3 months with PCP.  The patient was given clear instructions to go to ER or return to medical center if symptoms don't improve, worsen or new problems develop. The patient verbalized understanding. The patient was told to call to get lab results if they haven't heard anything in the next week.      Freeman Caldron, PA-C [facility] [place], [place]  12/03/2022, 4:39 PM

## 2022-12-09 ENCOUNTER — Other Ambulatory Visit: Payer: Self-pay

## 2022-12-12 ENCOUNTER — Other Ambulatory Visit: Payer: Self-pay

## 2023-01-15 IMAGING — CT CT ABD-PELV W/ CM
2 of 5 series · 17 of 46 positions shown, 19 images · IV contrast (OMNIPAQUE 350)
Comparison: CT examination dated October 02, 2019

CLINICAL DATA: Abdominal pain, acute nonlocalized.

EXAM:
CT ABDOMEN AND PELVIS WITH CONTRAST
TECHNIQUE: Multidetector CT imaging of the abdomen and pelvis was performed
using the standard protocol following bolus administration of
intravenous contrast.
CONTRAST:  80mL OMNIPAQUE IOHEXOL 350 MG/ML SOLN

[Series 2: axial st · axial · 0.83mm/px · z∈[+974,+1374]mm · 14 of 92 slices shown, 16 images]
[im 6/92  soft-tissue]
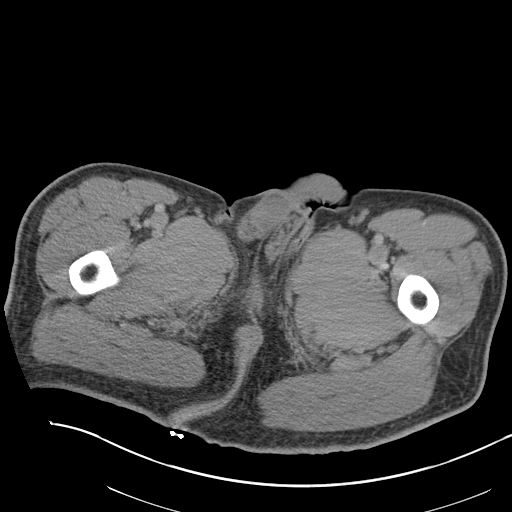
[im 6/92  bone]
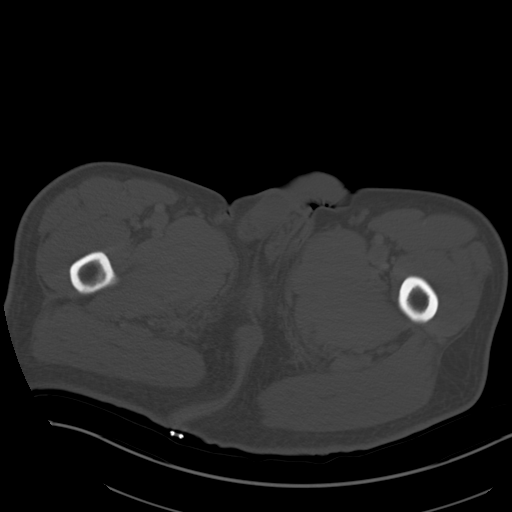
[im 12/92  soft-tissue]
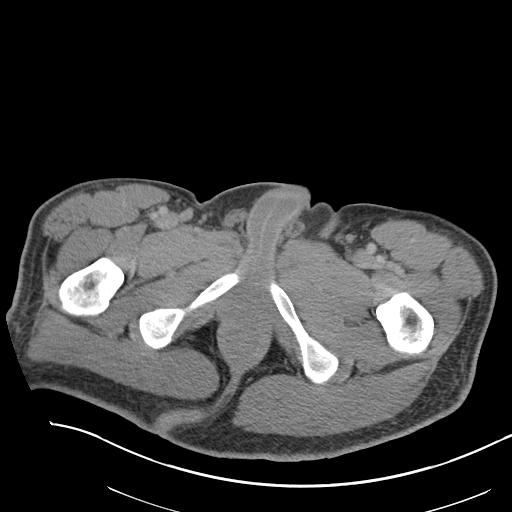
[im 18/92  soft-tissue]
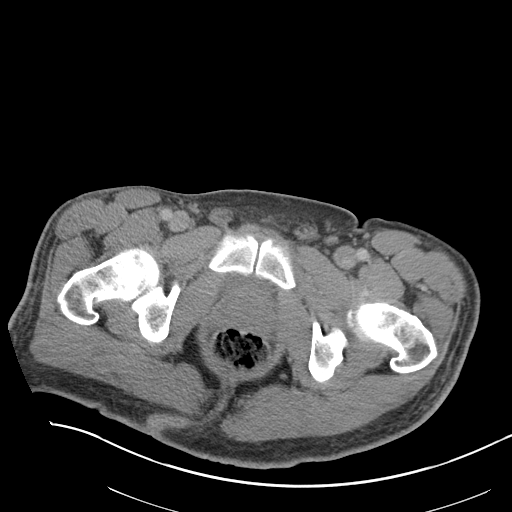
[im 23/92  soft-tissue]
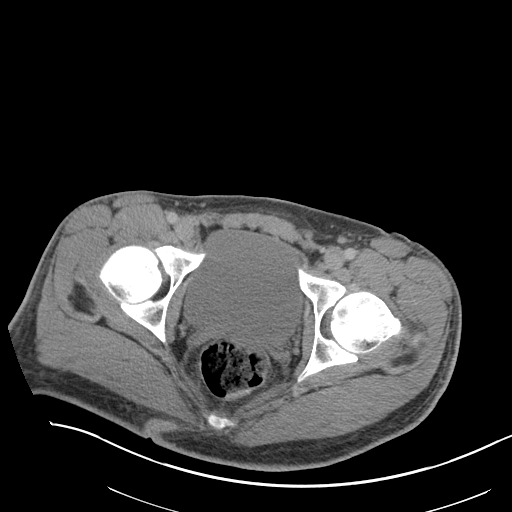
[im 29/92  soft-tissue]
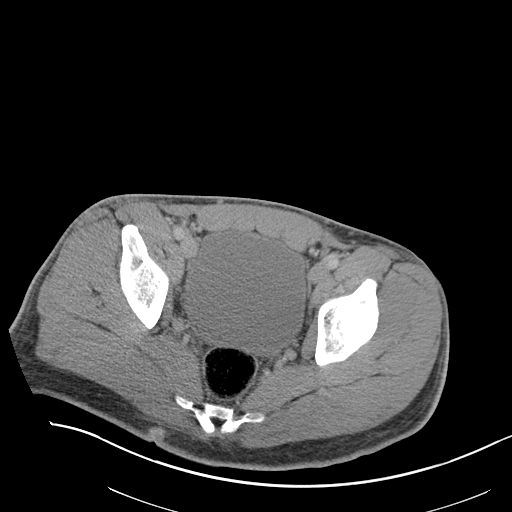
[im 35/92  soft-tissue]
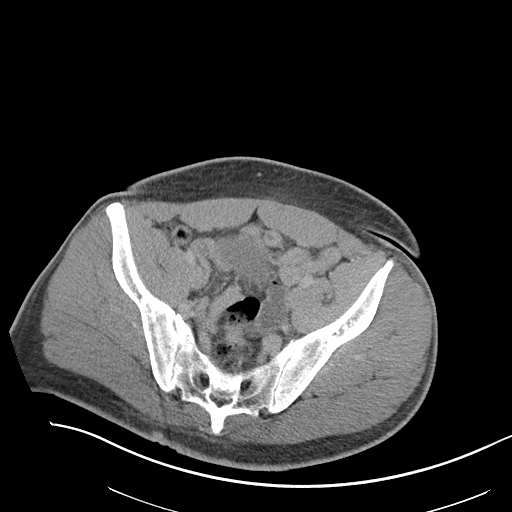
[im 40/92  soft-tissue]
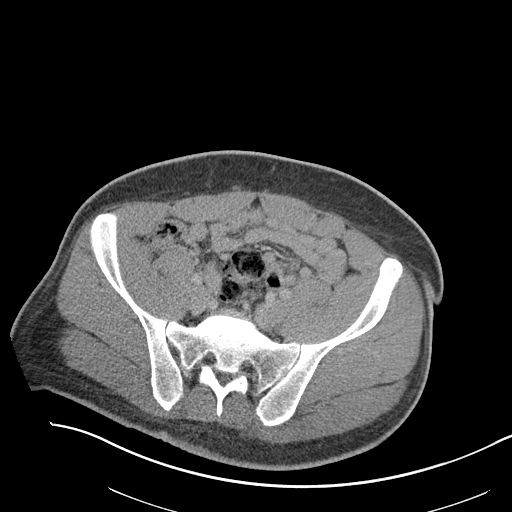
[im 52/92  soft-tissue]
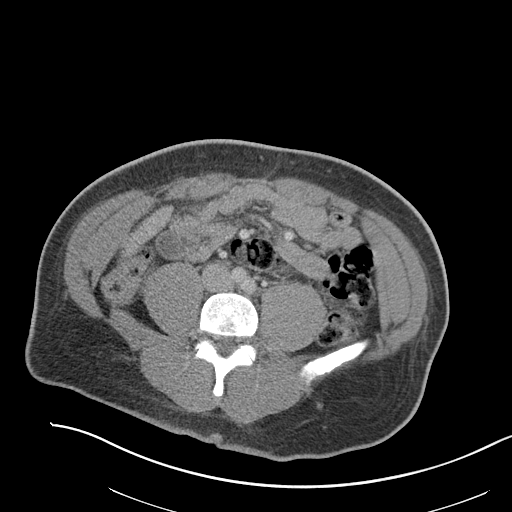
[im 57/92  soft-tissue]
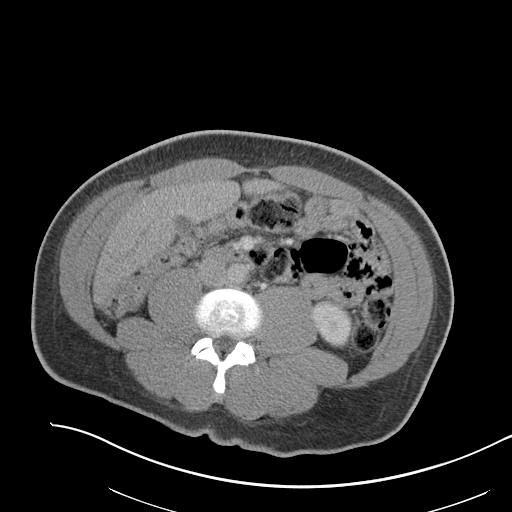
[im 57/92  bone]
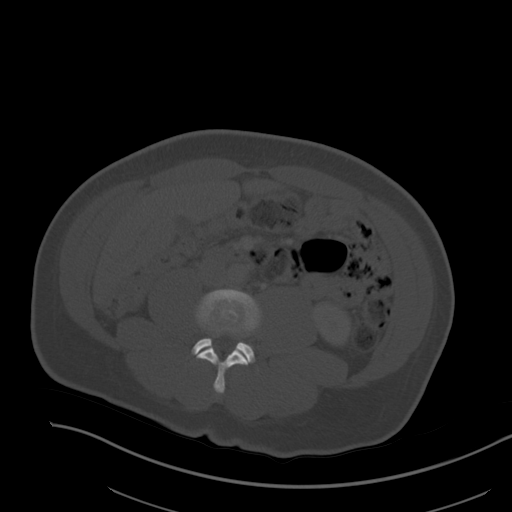
[im 63/92  soft-tissue]
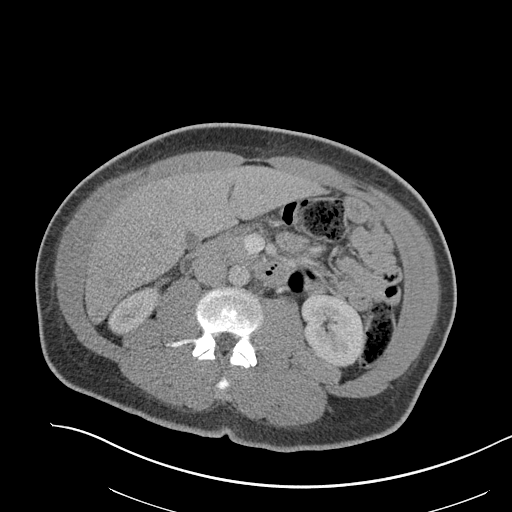
[im 69/92  soft-tissue]
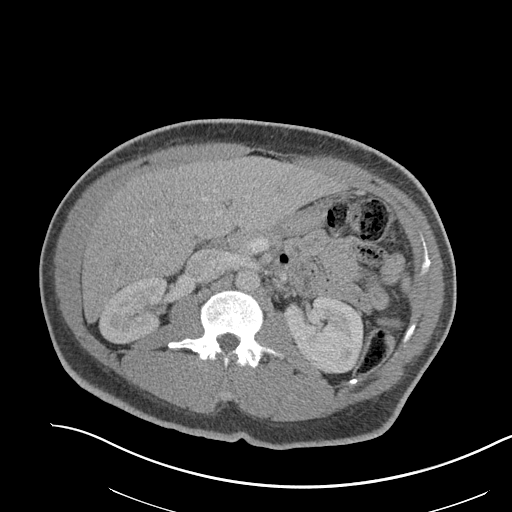
[im 74/92  soft-tissue]
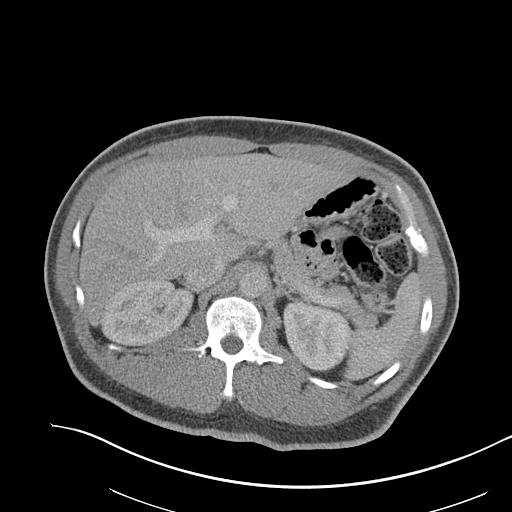
[im 80/92  soft-tissue]
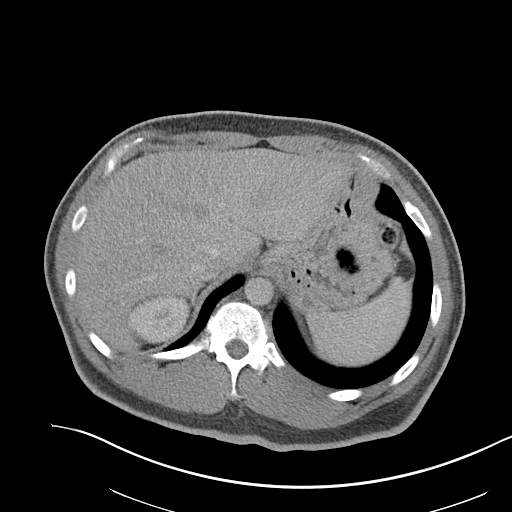
[im 86/92  soft-tissue]
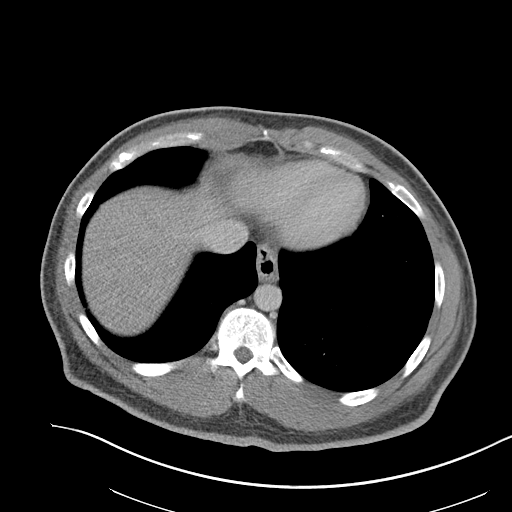

[Series 6: coronal st · coronal · 0.80mm/px · 3 of 151 slices shown]
[im 51/151  soft-tissue]
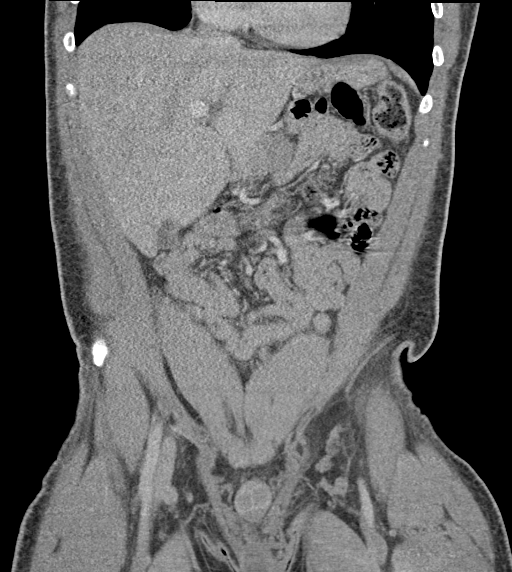
[im 67/151  soft-tissue]
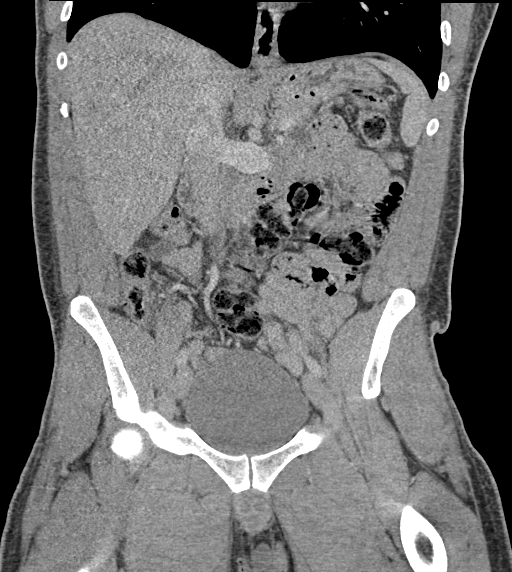
[im 84/151  soft-tissue]
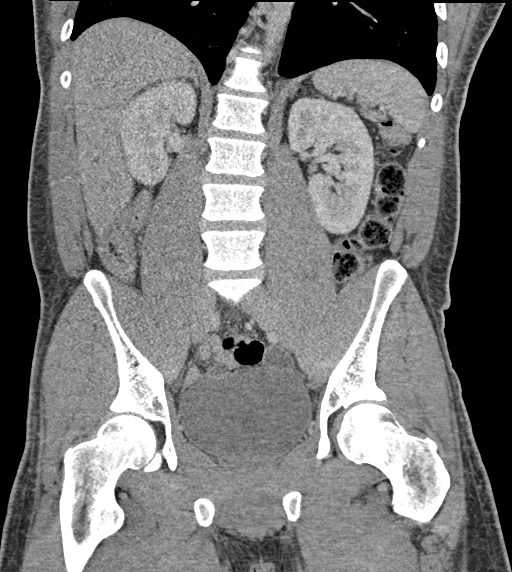

[17 of 46 positions shown; findings below may reference images not displayed]

FINDINGS: Lower chest: No acute abnormality.

Hepatobiliary: No focal liver abnormality is seen. No gallstones,
gallbladder wall thickening, or biliary dilatation.

Pancreas: Unremarkable. No pancreatic ductal dilatation or
surrounding inflammatory changes.

Spleen: Normal in size without focal abnormality.

Adrenals/Urinary Tract: Adrenal glands are unremarkable. 2 mm
punctate nonobstructing calculus in the upper pole of the right
kidney. Bladder is unremarkable.

Stomach/Bowel: Small hiatal hernia. Stomach is within normal limits.
Appendix appears normal. No evidence of bowel wall thickening,
distention, or inflammatory changes.

Vascular/Lymphatic: No significant vascular findings are present. No
enlarged abdominal or pelvic lymph nodes.

Reproductive: Prostate is unremarkable.

Other: No abdominal wall hernia or abnormality. No abdominopelvic
ascites.

Musculoskeletal: No acute or significant osseous findings.
IMPRESSION: No CT evidence of acute abdominal/pelvic process.

## 2023-02-09 ENCOUNTER — Other Ambulatory Visit: Payer: Self-pay

## 2023-02-11 ENCOUNTER — Ambulatory Visit (INDEPENDENT_AMBULATORY_CARE_PROVIDER_SITE_OTHER): Payer: Medicaid Other | Admitting: Internal Medicine

## 2023-02-11 ENCOUNTER — Other Ambulatory Visit: Payer: Self-pay

## 2023-02-11 ENCOUNTER — Encounter: Payer: Self-pay | Admitting: Internal Medicine

## 2023-02-11 VITALS — BP 128/74 | HR 82 | Ht 71.0 in | Wt 242.0 lb

## 2023-02-11 DIAGNOSIS — E1065 Type 1 diabetes mellitus with hyperglycemia: Secondary | ICD-10-CM

## 2023-02-11 DIAGNOSIS — E1042 Type 1 diabetes mellitus with diabetic polyneuropathy: Secondary | ICD-10-CM

## 2023-02-11 LAB — POCT GLYCOSYLATED HEMOGLOBIN (HGB A1C): Hemoglobin A1C: 7.9 % — AB (ref 4.0–5.6)

## 2023-02-11 LAB — POCT GLUCOSE (DEVICE FOR HOME USE): Glucose Fasting, POC: 92 mg/dL (ref 70–99)

## 2023-02-11 MED ORDER — INSULIN PEN NEEDLE 32G X 4 MM MISC
1.0000 | Freq: Four times a day (QID) | 3 refills | Status: DC
  Filled 2023-05-17: qty 300, 75d supply, fill #0
  Filled 2023-05-29: qty 100, 25d supply, fill #0

## 2023-02-11 MED ORDER — LANTUS SOLOSTAR 100 UNIT/ML ~~LOC~~ SOPN
22.0000 [IU] | PEN_INJECTOR | Freq: Every day | SUBCUTANEOUS | 3 refills | Status: DC
  Filled 2023-02-11: qty 30, 136d supply, fill #0
  Filled 2023-02-12 – 2023-02-19 (×2): qty 18, 81d supply, fill #0
  Filled 2023-05-17 – 2023-05-29 (×2): qty 18, 81d supply, fill #1
  Filled 2023-09-01: qty 18, 81d supply, fill #2

## 2023-02-11 MED ORDER — NOVOLOG FLEXPEN 100 UNIT/ML ~~LOC~~ SOPN
PEN_INJECTOR | SUBCUTANEOUS | 3 refills | Status: DC
Start: 2023-02-11 — End: 2023-09-30
  Filled 2023-02-11: qty 45, fill #0
  Filled 2023-02-12: qty 39, 87d supply, fill #0
  Filled 2023-02-19: qty 39, 86d supply, fill #0
  Filled 2023-05-17 – 2023-05-29 (×2): qty 39, 86d supply, fill #1
  Filled 2023-09-01: qty 39, 86d supply, fill #2

## 2023-02-11 NOTE — Progress Notes (Signed)
Name: Jonathan Porter  MRN/ DOB: [id number], [birth year]   Age/ Sex: 32 y.o., male    PCP: Marcine Matar, MD   Reason for Endocrinology Evaluation: Type 1 Diabetes Mellitus     Date of Initial Endocrinology Visit: 08/12/2022    PATIENT IDENTIFIER: Mr. Jonathan Porter is a 32 y.o. male with a past medical history of T1DM. The patient presented for initial endocrinology clinic visit on 08/12/2022 for consultative assistance with his diabetes management.    HPI: Jonathan Porter was    Diagnosed with DM age 63 Hemoglobin A1c has ranged from 7.9% in 2020, peaking at 12.7% in 2022.   Has chronic GI issues  Does physical work and dexcom did not work for him as it kept falling     On his initial visit to our clinic his A1c was 9.0%  SUBJECTIVE:   During the last visit (12/03/2022): A1c 9.0%  Today (02/11/23): Jonathan Porter is here for follow-up on diabetes management.He  checks his blood sugars multiple  times daily. The patient has  had hypoglycemic episodes since the last clinic visit, which typically occur at night. The patient is  symptomatic     Has chronic tingling of hands but rare of the feet associated with back pain  Has anxiety at night   Denies nausea or vomiting  Denies constipation or diarrhea   HOME DIABETES REGIMEN: Lantus 22 units daily - taking 22 units  Novolog 12 units TIDQAC CF: NovoLog(BG -130/35)   Statin: yes ACE-I/ARB: no    METER DOWNLOAD SUMMARY:  90 day average 160 mg/dL     DIABETIC COMPLICATIONS: Microvascular complications:   Denies: CKD, retinopathy, neuropathy  Last eye exam: 09/2022  Macrovascular complications:   Denies: CAD, PVD, CVA   PAST HISTORY: Past Medical History:  Past Medical History:  Diagnosis Date   Anxiety    Depression    Diabetes mellitus without complication (HCC)    Gastroparesis    GERD (gastroesophageal reflux disease)    Past Surgical History:  Past Surgical History:  Procedure Laterality Date    NO PAST SURGERIES     UPPER GASTROINTESTINAL ENDOSCOPY      Social History:  reports that he has been smoking cigarettes. He has a 3.00 pack-year smoking history. He has never used smokeless tobacco. He reports current alcohol use. He reports current drug use. Drug: Marijuana. Family History:  Family History  Problem Relation Age of Onset   Thyroid disease Mother    Diabetes Maternal Aunt    Diabetes Maternal Grandmother    Diabetes Maternal Grandfather    Thyroid disease Other    Colon cancer Neg Hx    Esophageal cancer Neg Hx    Stomach cancer Neg Hx    Rectal cancer Neg Hx      HOME MEDICATIONS: Allergies as of 02/11/2023       Reactions   Shellfish Allergy Anaphylaxis        Medication List        Accurate as of Feb 11, 2023  8:46 AM. If you have any questions, ask your nurse or doctor.          STOP taking these medications    blood glucose meter kit and supplies Kit Stopped by: Scarlette Shorts, MD   ondansetron 4 MG disintegrating tablet Commonly known as: ZOFRAN-ODT Stopped by: Scarlette Shorts, MD       TAKE these medications    Accu-Chek Guide test strip Generic drug: glucose blood  Use 3 (three) times daily. Use to check blood sugar three times daily.   Accu-Chek Guide w/Device Kit use to test. What changed: Another medication with the same name was removed. Continue taking this medication, and follow the directions you see here. Changed by: Scarlette Shorts, MD   Accu-Chek Softclix Lancets lancets Use to check blood sugar three times daily.   dicyclomine 20 MG tablet Commonly known as: BENTYL Take 1 tablet (20 mg total) by mouth every 8 (eight) hours as needed for spasms.   Insulin Pen Needle 32G X 4 MM Misc use as directed in the morning, at noon, in the evening, and at bedtime to inject insulin   Lantus SoloStar 100 UNIT/ML Solostar Pen Generic drug: insulin glargine Inject 25 Units into the skin daily.   lisinopril  5 MG tablet Commonly known as: ZESTRIL Take 1 tablet (5 mg total) by mouth daily.   metoCLOPramide 10 MG tablet Commonly known as: REGLAN Take 1 tablet 1/2-hour before meals   NovoLOG FlexPen 100 UNIT/ML FlexPen Generic drug: insulin aspart Inject 12 Units into the skin up to 3 (three) times daily with meals.   omeprazole 40 MG capsule Commonly known as: PRILOSEC Take 1 capsule (40 mg total) by mouth daily with breakfast.   promethazine 25 MG suppository Commonly known as: PHENERGAN Place 1 suppository (25 mg total) rectally every 8 (eight) hours as needed for nausea or vomiting. What changed: Another medication with the same name was removed. Continue taking this medication, and follow the directions you see here. Changed by: Scarlette Shorts, MD   rosuvastatin 20 MG tablet Commonly known as: Crestor Take 1 tablet (20 mg total) by mouth daily.   sildenafil 50 MG tablet Commonly known as: VIAGRA TAKE ONE TABLET BY MOUTH ON AN EMPTY STOMACH 1 HOUR BEFORE SEXUAL ACTIVITY AS NEEDED         ALLERGIES: Allergies  Allergen Reactions   Shellfish Allergy Anaphylaxis     REVIEW OF SYSTEMS: A comprehensive ROS was conducted with the patient and is negative except as per HPI   OBJECTIVE:   VITAL SIGNS: BP 128/74 (BP Location: Left Arm, Patient Position: Sitting, Cuff Size: Large)   Pulse 82   Ht 5\' 11"  (1.803 m)   Wt 242 lb (109.8 kg)   SpO2 99%   BMI 33.75 kg/m    PHYSICAL EXAM:  General: Pt appears well and is in NAD  Neck: General: Supple without adenopathy or carotid bruits. Thyroid: Thyroid size normal.  No goiter or nodules appreciated.   Lungs: Clear with good BS bilat with no rales, rhonchi, or wheezes  Heart: RRR   Abdomen:  soft, nontender  Extremities:  Lower extremities - No pretibial edema  Neuro: MS is good with appropriate affect, pt is alert and Ox3    DM foot exam: 02/11/2023   The skin of the feet is without sores or ulcerations. The  pedal pulses are 2+ on right and 2+ on left. The sensation is decreased to a screening 5.07, 10 gram monofilament bilaterally at the heels     DATA REVIEWED:  Lab Results  Component Value Date   HGBA1C 7.9 (A) 02/11/2023   HGBA1C 9.5 (A) 12/03/2022   HGBA1C 9.0 (H) 07/18/2022    Latest Reference Range & Units 07/18/22 13:40  Sodium 134 - 144 mmol/L 139  Potassium 3.5 - 5.2 mmol/L 4.7  Chloride 96 - 106 mmol/L 100  CO2 20 - 29 mmol/L 24  Glucose 70 - 99  mg/dL 161 (H)  BUN 6 - 20 mg/dL 12  Creatinine 0.96 - 0.45 mg/dL 4.09  Calcium 8.7 - 81.1 mg/dL 9.3  BUN/Creatinine Ratio 9 - 20  10  eGFR >59 mL/min/1.73 77  Alkaline Phosphatase 44 - 121 IU/L 114  Albumin 4.1 - 5.1 g/dL 3.7 (L)  Albumin/Globulin Ratio 1.2 - 2.2  1.5  AST 0 - 40 IU/L 18  ALT 0 - 44 IU/L 20  Total Protein 6.0 - 8.5 g/dL 6.2  Total Bilirubin 0.0 - 1.2 mg/dL 0.3    ASSESSMENT / PLAN / RECOMMENDATIONS:   1) Type 1 Diabetes Mellitus, Sub-Optimally controlled, With Neuropathic complications - Most recent A1c of 7.9 %. Goal A1c < 7.0 %.    - A1c trended down from 9.0% to 7.9%  - I have praised the pt on improving lifestyle changes and glycemic control  - He tried Dexcom and did not like it , as he keeps knocking it off at work  - Pt to bring his meter down  - Pt endorses hypoglycemia in the morning, will reduce lantus as below, as he is on more than previously prescribed      MEDICATIONS: Decrease  Lantus 22 units daily  Continue Novolog 12 units TIDQAC Continue  CF: Novolog (BG-130/35)   EDUCATION / INSTRUCTIONS: BG monitoring instructions: Patient is instructed to check his blood sugars 3 times a day, before meals . Call [facility] if: BG persistently < 70  I reviewed the Rule of 15 for the treatment of hypoglycemia in detail with the patient. Literature supplied.   2) Diabetic complications:  Eye: Does not have known diabetic retinopathy.  Neuro/ Feet: Does  have known diabetic  peripheral neuropathy. Renal: Patient does not have known baseline CKD    F/U in 6 months    Signed electronically by: Lyndle Herrlich, MD  [facility]  [facility] [address] [place] [place], [place] [address] Phone: [phone] FAX: [phone]   CC: Marcine Matar, MD [address] [place] [address] Phone: [phone]  Fax: [phone]    Return to Endocrinology clinic as below: No future appointments.

## 2023-02-11 NOTE — Patient Instructions (Signed)
Decrease  Lantus 22 units daily  Continue Novolog 12 units with EACH meal  Novolog correctional insulin: ADD extra units on insulin to your meal-time Novolog dose if your blood sugars are higher than 165. Use the scale below to help guide you:   Blood sugar before meal Number of units to inject  Less than 165 0 unit  166 -  200 1 units  201 -  235 2 units  236 -  270 3 units  271 -  305 4 units  306 -  340 5 units  341 -  375 6 units  376 -  410 7 units  411 -  445 8 units   HOW TO TREAT LOW BLOOD SUGARS (Blood sugar LESS THAN 70 MG/DL) Please follow the RULE OF 15 for the treatment of hypoglycemia treatment (when your (blood sugars are less than 70 mg/dL)   STEP 1: Take 15 grams of carbohydrates when your blood sugar is low, which includes:  3-4 GLUCOSE TABS  OR 3-4 OZ OF JUICE OR REGULAR SODA OR ONE TUBE OF GLUCOSE GEL    STEP 2: RECHECK blood sugar in 15 MINUTES STEP 3: If your blood sugar is still low at the 15 minute recheck --> then, go back to STEP 1 and treat AGAIN with another 15 grams of carbohydrates.

## 2023-02-12 ENCOUNTER — Other Ambulatory Visit: Payer: Self-pay

## 2023-02-17 ENCOUNTER — Other Ambulatory Visit: Payer: Self-pay

## 2023-02-18 ENCOUNTER — Other Ambulatory Visit: Payer: Self-pay

## 2023-02-19 ENCOUNTER — Ambulatory Visit: Payer: Self-pay

## 2023-02-19 ENCOUNTER — Other Ambulatory Visit: Payer: Self-pay
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
## 2023-02-20 NOTE — Telephone Encounter (Signed)
Spoke with patient . Verified name & DOB  Patient is requesting earlier appointment , Voices he wants to avoid going the ED for shoulder pain. Appointment given 02/23/2023

## 2023-02-23 ENCOUNTER — Encounter: Payer: Self-pay | Admitting: Family Medicine

## 2023-02-23 ENCOUNTER — Other Ambulatory Visit: Payer: Self-pay

## 2023-02-23 ENCOUNTER — Ambulatory Visit: Payer: Medicaid Other | Attending: Family Medicine | Admitting: Family Medicine

## 2023-02-23 VITALS — BP 120/79 | HR 91 | Ht 71.0 in | Wt 249.8 lb

## 2023-02-23 DIAGNOSIS — G8929 Other chronic pain: Secondary | ICD-10-CM

## 2023-02-23 DIAGNOSIS — Z01818 Encounter for other preprocedural examination: Secondary | ICD-10-CM | POA: Insufficient documentation

## 2023-02-23 DIAGNOSIS — M546 Pain in thoracic spine: Secondary | ICD-10-CM | POA: Diagnosis not present

## 2023-02-23 DIAGNOSIS — M62838 Other muscle spasm: Secondary | ICD-10-CM | POA: Insufficient documentation

## 2023-02-23 MED ORDER — TIZANIDINE HCL 4 MG PO TABS
4.0000 mg | ORAL_TABLET | Freq: Two times a day (BID) | ORAL | 1 refills | Status: DC | PRN
Start: 2023-02-23 — End: 2024-07-11
  Filled 2023-02-23: qty 60, 30d supply, fill #0

## 2023-02-23 NOTE — Patient Instructions (Signed)
Muscle Cramps and Spasms Muscle cramps and spasms occur when a muscle or muscles tighten and you have no control over this tightening (involuntary muscle contraction). They are a common problem that can happen in any muscle. The most common place is in the calf muscles of the leg. There are a few ways that muscle cramps and spasms differ: Muscle cramps are painful. They come and go and may last for a few seconds or up to 15 minutes. Muscle cramps are often more forceful and last longer than muscle spasms. Muscle spasms may or may not be painful. They may last just a few seconds or last much longer. Certain conditions, such as diabetes or Parkinson's disease, can make you more likely to have cramps or spasms. But in most cases, cramps and spasms are not caused by other conditions. Common causes include: Overexertion. This is when you do more physical work or exercise than your body is ready for. Overuse from doing the same movements too many times. Staying in one position for too long. Improper preparation, form, or technique when playing a sport or doing an activity. Not enough water or other fluids in your body (dehydration). Other causes may include: Injury. Side effects of some medicines. Too few salts and minerals in your body (electrolytes), such as potassium and calcium. This could happen if you are taking water pills (diuretics) or if you are pregnant. In many cases, the cause of muscle cramps or spasms is not known. Follow these instructions at home: Eating and drinking Drink enough fluid to keep your pee (urine) pale yellow. This can help prevent cramps or spasms. Eat a healthy diet that includes a lot of nutrients to help your muscles work. A healthy diet includes fruits and vegetables, lean protein, whole grains, and low-fat or nonfat dairy products. Managing pain and stiffness     Try to massage, stretch, and relax the affected muscle. Do this for a few minutes at a time. If told,  put ice on the muscles. This may help if you are sore or have pain after a cramp or spasm. Put ice in a plastic bag. Place a towel between your skin and the bag. Leave the ice on for 20 minutes, 2-3 times a day. If told, apply heat to tight or tense muscles as often as told by your health care provider. Use the heat source that your provider recommends, such as a moist heat pack or a heating pad. Place a towel between your skin and the heat source. Leave the heat on for 20-30 minutes. If your skin turns bright red, remove the ice or heat right away to prevent skin damage. The risk of damage is higher if you cannot feel pain, heat, or cold. Take hot showers or baths to help relax tight muscles. General instructions If you are having cramps often, avoid intense exercise for a few days. Take over-the-counter and prescription medicines only as told by your provider. Watch for any changes in your symptoms. Contact a health care provider if: Your cramps or spasms get more severe or happen more often. Your cramps or spasms do not get better over time. This information is not intended to replace advice given to you by your health care provider. Make sure you discuss any questions you have with your health care provider. Document Revised: 04/29/2022 Document Reviewed: 04/29/2022 Elsevier Patient Education  2024 Elsevier Inc.  

## 2023-02-23 NOTE — Progress Notes (Signed)
Subjective:  Patient ID: Jonathan Porter, male    DOB: 07-Apr-1991  Age: 32 y.o. MRN: [id number]  CC: Back Pain   HPI Jonathan Porter is a 32 y.o. year old male of Dr. Laural Benes with a history of depression, type 1 diabetes mellitus. Diabetes is managed by endocrine; his last visit 2 weeks ago.    Interval History:  Complaints of upper back pain around his right shoulder blade present for years and is worsened by his posture.  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 Pain is a 1-2/10 at the moment but it can get to 8/10. It feels like poking, stabbing He has had some tingling in his hands and sometimes has numbness. He is right hand dominant  Past Medical History:  Diagnosis Date   Anxiety    Depression    Diabetes mellitus without complication (HCC)    Gastroparesis    GERD (gastroesophageal reflux disease)     Past Surgical History:  Procedure Laterality Date   NO PAST SURGERIES     UPPER GASTROINTESTINAL ENDOSCOPY      Family History  Problem Relation Age of Onset   Thyroid disease Mother    Diabetes Maternal Aunt    Diabetes Maternal Grandmother    Diabetes Maternal Grandfather    Thyroid disease Other    Colon cancer Neg Hx    Esophageal cancer Neg Hx    Stomach cancer Neg Hx    Rectal cancer Neg Hx     Social History   Socioeconomic History   Marital status: Single    Spouse name: Not on file   Number of children: Not on file   Years of education: Not on file   Highest education level: Not on file  Occupational History   Not on file  Tobacco Use   Smoking status: Every Day    Packs/day: 0.50    Years: 6.00    Additional pack years: 0.00    Total pack years: 3.00    Types: Cigarettes   Smokeless tobacco: Never  Vaping Use   Vaping Use: Never used  Substance and Sexual Activity   Alcohol use: Yes    Comment: social    Drug use: Yes    Types: Marijuana   Sexual activity: Yes     Birth control/protection: Condom    Comment: stated used condom 80% of the time   Other Topics Concern   Not on file  Social History Narrative   Not on file   Social Determinants of Health   Financial Resource Strain: Not on file  Food Insecurity: Not on file  Transportation Needs: Not on file  Physical Activity: Not on file  Stress: Not on file  Social Connections: Not on file    Allergies  Allergen Reactions   Shellfish Allergy Anaphylaxis    Outpatient Medications Prior to Visit  Medication Sig Dispense Refill   insulin aspart (NOVOLOG FLEXPEN) 100 UNIT/ML FlexPen Max daily 45 units 45 mL 3   insulin glargine (LANTUS SOLOSTAR) 100 UNIT/ML Solostar Pen Inject 22 Units into the skin daily. 30 mL 3   Accu-Chek Softclix Lancets lancets Use to check blood sugar three times daily. (Patient not taking: Reported on 02/23/2023) 100 each 3   Blood Glucose Monitoring Suppl (ACCU-CHEK GUIDE ME) w/Device KIT use to test. (Patient not taking: Reported on 02/23/2023) 1 kit 0   dicyclomine (BENTYL) 20 MG  tablet Take 1 tablet (20 mg total) by mouth every 8 (eight) hours as needed for spasms. (Patient not taking: Reported on 02/23/2023) 30 tablet 0   glucose blood (ACCU-CHEK GUIDE) test strip Use 3 (three) times daily. Use to check blood sugar three times daily. (Patient not taking: Reported on 02/23/2023) 300 each 3   Insulin Pen Needle 32G X 4 MM MISC use as directed in the morning, at noon, in the evening, and at bedtime to inject insulin (Patient not taking: Reported on 02/23/2023) 400 each 3   lisinopril (ZESTRIL) 5 MG tablet Take 1 tablet (5 mg total) by mouth daily. (Patient not taking: Reported on 02/23/2023) 90 tablet 3   metoCLOPramide (REGLAN) 10 MG tablet Take 1 tablet 1/2-hour before meals (Patient not taking: Reported on 02/23/2023) 90 tablet 6   omeprazole (PRILOSEC) 40 MG capsule Take 1 capsule (40 mg total) by mouth daily with breakfast. 90 capsule 1   promethazine (PHENERGAN) 25 MG  suppository Place 1 suppository (25 mg total) rectally every 8 (eight) hours as needed for nausea or vomiting. (Patient not taking: Reported on 02/11/2023) 12 each 2   rosuvastatin (CRESTOR) 20 MG tablet Take 1 tablet (20 mg total) by mouth daily. (Patient not taking: Reported on 02/23/2023) 90 tablet 1   sildenafil (VIAGRA) 50 MG tablet TAKE ONE TABLET BY MOUTH ON AN EMPTY STOMACH 1 HOUR BEFORE SEXUAL ACTIVITY AS NEEDED (Patient not taking: Reported on 02/23/2023) 30 tablet 1   Facility-Administered Medications Prior to Visit  Medication Dose Route Frequency Provider Last Rate Last Admin   0.9 %  sodium chloride infusion  500 mL Intravenous Once Mansouraty, Netty Starring., MD         ROS Review of Systems  Constitutional:  Negative for activity change and appetite change.  HENT:  Negative for sinus pressure and sore throat.   Respiratory:  Negative for chest tightness, shortness of breath and wheezing.   Cardiovascular:  Negative for chest pain and palpitations.  Gastrointestinal:  Negative for abdominal distention, abdominal pain and constipation.  Genitourinary: Negative.   Musculoskeletal:        See HPI  Psychiatric/Behavioral:  Negative for behavioral problems and dysphoric mood.     Objective:  BP 120/79   Pulse 91   Ht 5\' 11"  (1.803 m)   Wt 249 lb 12.8 oz (113.3 kg)   SpO2 99%   BMI 34.84 kg/m      02/23/2023   10:48 AM 02/11/2023    8:22 AM 12/03/2022    4:25 PM  BP/Weight  Systolic BP 120 128 149  Diastolic BP 79 74 80  Wt. (Lbs) 249.8 242 240.8  BMI 34.84 kg/m2 33.75 kg/m2 33.58 kg/m2      Physical Exam Constitutional:      Appearance: He is well-developed.  Cardiovascular:     Rate and Rhythm: Normal rate.     Heart sounds: Normal heart sounds. No murmur heard. Pulmonary:     Effort: Pulmonary effort is normal.     Breath sounds: Normal breath sounds. No wheezing or rales.  Chest:     Chest wall: No tenderness.  Abdominal:     General: Bowel sounds are  normal. There is no distension.     Palpations: Abdomen is soft. There is no mass.     Tenderness: There is no abdominal tenderness.  Musculoskeletal:        General: Normal range of motion.     Right lower leg: No edema.  Left lower leg: No edema.     Comments: Slight tenderness on palpation of medial border of the right scapula with slight tenderness on range of motion of right upper extremity Negative Hawkins and Neer's signs Normal handgrip bilaterally  Neurological:     Mental Status: He is alert and oriented to person, place, and time.  Psychiatric:        Mood and Affect: Mood normal.        Latest Ref Rng & Units 07/18/2022    1:40 PM 06/15/2022    2:12 AM 10/03/2021    3:15 PM  CMP  Glucose 70 - 99 mg/dL 409  811  914   BUN 6 - 20 mg/dL 12  20  7    Creatinine 0.76 - 1.27 mg/dL 7.82  9.56  2.13   Sodium 134 - 144 mmol/L 139  141  138   Potassium 3.5 - 5.2 mmol/L 4.7  3.5  3.5   Chloride 96 - 106 mmol/L 100  108  102   CO2 20 - 29 mmol/L 24  25  23    Calcium 8.7 - 10.2 mg/dL 9.3  9.0  9.4   Total Protein 6.0 - 8.5 g/dL 6.2  6.7  6.9   Total Bilirubin 0.0 - 1.2 mg/dL 0.3  0.5  0.6   Alkaline Phos 44 - 121 IU/L 114  71  84   AST 0 - 40 IU/L 18  22  23    ALT 0 - 44 IU/L 20  20  20      Lipid Panel     Component Value Date/Time   CHOL 167 07/18/2022 1340   TRIG 64 07/18/2022 1340   HDL 52 07/18/2022 1340   CHOLHDL 3.2 07/18/2022 1340   CHOLHDL 8.7 08/31/2007 0440   VLDL 52 (H) 08/31/2007 0440   LDLCALC 103 (H) 07/18/2022 1340    CBC    Component Value Date/Time   WBC 9.7 06/15/2022 0212   RBC 4.79 06/15/2022 0212   HGB 13.4 06/15/2022 0212   HCT 39.8 06/15/2022 0212   PLT 283 06/15/2022 0212   MCV 83.1 06/15/2022 0212   MCH 28.0 06/15/2022 0212   MCHC 33.7 06/15/2022 0212   RDW 13.8 06/15/2022 0212   LYMPHSABS 5.3 (H) 06/15/2022 0212   MONOABS 0.7 06/15/2022 0212   EOSABS 0.8 (H) 06/15/2022 0212   BASOSABS 0.1 06/15/2022 0212    Lab Results   Component Value Date   HGBA1C 7.9 (A) 02/11/2023    Assessment & Plan:   1. Chronic right-sided thoracic back pain Pain has been present for several years - Ambulatory referral to Physical Therapy  2. Muscle spasm Counseled on sedating side effects of muscle relaxant Advised to apply heat - tiZANidine (ZANAFLEX) 4 MG tablet; Take 1 tablet (4 mg total) by mouth 2 (two) times daily as needed.  Dispense: 60 tablet; Refill: 1   Meds ordered this encounter  Medications   tiZANidine (ZANAFLEX) 4 MG tablet    Sig: Take 1 tablet (4 mg total) by mouth 2 (two) times daily as needed.    Dispense:  60 tablet    Refill:  1    Follow-up: No follow-ups on file.       Hoy Register, MD, FAAFP. [facility] [place], [place] [phone]   02/23/2023, 1:13 PM

## 2023-02-23 NOTE — Progress Notes (Signed)
Upper back pain  

## 2023-03-03 ENCOUNTER — Other Ambulatory Visit: Payer: Self-pay

## 2023-03-03 ENCOUNTER — Other Ambulatory Visit (HOSPITAL_COMMUNITY): Payer: Self-pay

## 2023-03-06 ENCOUNTER — Other Ambulatory Visit: Payer: Self-pay

## 2023-03-06 ENCOUNTER — Other Ambulatory Visit (HOSPITAL_BASED_OUTPATIENT_CLINIC_OR_DEPARTMENT_OTHER): Payer: Self-pay

## 2023-03-10 ENCOUNTER — Other Ambulatory Visit (HOSPITAL_COMMUNITY): Payer: Self-pay

## 2023-03-16 NOTE — Therapy (Signed)
OUTPATIENT PHYSICAL THERAPY THORACOLUMBAR EVALUATION   Patient Name: Jonathan Porter MRN: [id number] DOB:[birth year], 32 y.o., male Today's Date: 03/17/2023  END OF SESSION:  PT End of Session - 03/17/23 1140     Visit Number 1    Number of Visits 17    Date for PT Re-Evaluation 05/12/23    Authorization Type [place] [employer]    Authorization Time Period auth tbd    PT Start Time 1142    PT Stop Time 1231    PT Time Calculation (min) 49 min    Activity Tolerance Patient tolerated treatment well    Behavior During Therapy WFL for tasks assessed/performed             Past Medical History:  Diagnosis Date   Anxiety    Depression    Diabetes mellitus without complication (HCC)    Gastroparesis    GERD (gastroesophageal reflux disease)    Past Surgical History:  Procedure Laterality Date   NO PAST SURGERIES     UPPER GASTROINTESTINAL ENDOSCOPY     Patient Active Problem List   Diagnosis Date Noted   MDD (major depressive disorder), recurrent episode, moderate (HCC) 09/16/2021   Major depressive disorder, single episode, moderate (HCC) 01/01/2021   Abdominal pain, recurrent 01/01/2021   Marijuana abuse 12/17/2020   Intractable nausea and vomiting 07/03/2019   Gastroparesis 07/02/2019   Influenza vaccine refused 06/03/2019   Erectile dysfunction 06/03/2019   Obesity (BMI 30-39.9) 06/03/2019   Type 1 diabetes mellitus with diabetic polyneuropathy (HCC) 01/21/2019   Type 1 diabetes mellitus with hyperglycemia (HCC) 01/21/2019   Paronychia of finger of left hand 01/20/2017   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) 04/18/2016   Diabetes type 1, uncontrolled 08/23/2015   Onychomycosis of toenail 08/23/2015   Phimosis 08/07/2014    PCP: Marcine Matar, MD  REFERRING PROVIDER: Hoy Register, MD  REFERRING DIAG: [phone] (ICD-10-CM) - Chronic right-sided thoracic back pain  Rationale for Evaluation and Treatment: Rehabilitation  THERAPY DIAG:  Pain in  thoracic spine  Abnormal posture  ONSET DATE: a couple years  SUBJECTIVE:                                                                                                                                                                                           SUBJECTIVE STATEMENT: Pt states he has pain all the time, variable based on activity and positioning. Feels better with more neutral positioning.  Cannot recall any specific injury precipitating this episode but does endorse remote injuries and heavy work duties for past few years. Seems like it has been slowly worsening. States his hands  occasionally fall asleep (L and R), has been going on about a year, he endorses history of diabetic neuropathy, unsure if this is related. Denies any other UE referral. denies bowel/bladder symptoms, denies visual changes, denies headaches, denies swallowing issues, denies changes in weight, denies fevers.   PERTINENT HISTORY:  DM1 w/ polyneuropathy, depression  PAIN:  Are you having pain: 1-2/10 Location/description: R upper back, tingling in fingers (digits 4 and 5) Best-worst over past week: 1-6/10 - aggravating factors: driving, sitting, prolonged activity - Easing factors: repositioning, medication    PRECAUTIONS: None  WEIGHT BEARING RESTRICTIONS: No  FALLS:  Has patient fallen in last 6 months? No  LIVING ENVIRONMENT: About to move into a house, no steps  Lives alone   OCCUPATION: works maintenance at [employer], variable duties, climbing ladders   PLOF: Independent  PATIENT GOALS: trying to have less pain  NEXT MD VISIT: TBD (3-6 months per pt)  OBJECTIVE:   DIAGNOSTIC FINDINGS:  No recent imaging in chart, pt denies any imaging  PATIENT SURVEYS:  FOTO 74 current, 73 predicted  SCREENING FOR RED FLAGS: Red flag questioning/screening reassuring    COGNITION: Overall cognitive status: Within functional limits for tasks assessed     SENSATION: Light touch  intact B UE all dermatomes  POSTURE: increased posterior lean when unsupported, mild shift to L, increased UT activation BIL  PALPATION: Concordant tenderness R sided low/mid trap and rhomboid major, generalized tightness throughout B thoracolumbar and GH musculature but nontender  LUMBAR ROM:   AROM eval  Flexion 100% mid shin  Extension 100%  Right lateral flexion   Left lateral flexion   Right rotation 100%  Left rotation 75% *   (Blank rows = not tested)  (Key: WFL = within functional limits not formally assessed, * = concordant pain, s = stiffness/stretching sensation, NT = not tested)  RANGE OF MOTION:     Active  Right eval Left eval  Shoulder flexion 155 deg s 161 deg  Functional ER combo C5 s C7  Functional IR combo T3 T4  Knee extension    Ankle dorsiflexion     (Blank rows = not tested) (Key: WFL = within functional limits not formally assessed, * = concordant pain, s = stiffness/stretching sensation, NT = not tested)  Comments:    STRENGTH TESTING:  MMT Right eval Left eval  Shoulder flexion 5 5  Shoulder abduction 5 5  Elbow flexion    Elbow extension    Grip strength (avg) 58 kg 47 kg  Hip flexion    Hip abduction (modified sitting)    Knee flexion    Knee extension    Ankle dorsiflexion    Ankle plantarflexion     (Blank rows = not tested) (Key: WFL = within functional limits not formally assessed, * = concordant pain, s = stiffness/stretching sensation, NT = not tested)  Comments:     FUNCTIONAL TESTS:  Mildlylimited overhead reach R compared to L     TODAY'S TREATMENT:  [facility] Adult PT Treatment:                                                DATE: 03/17/23 Therapeutic Exercise: Scapular retraction w/ 10 sec hold x10 cues for mechanics/form Prone shoulder ext for low trap x10  HEP education   PATIENT EDUCATION:   Education details: Pt education on PT impairments, prognosis, and POC. Informed consent. Rationale for interventions, safe/appropriate HEP performance Person educated: Patient Education method: Explanation, Demonstration, Tactile cues, Verbal cues Education comprehension: verbalized understanding, returned demonstration, verbal cues required, tactile cues required, and needs further education    HOME EXERCISE PROGRAM: Access Code: [email or web address] URL: [email or web address] Date: 03/17/2023 Prepared by: Fransisco Hertz  Exercises - Seated Scapular Retraction  - 2-3 x daily - 7 x weekly - 1 sets - 10 reps - 5-10sec hold - Prone Scapular Slide with Shoulder Extension  - 2-3 x daily - 7 x weekly - 1 sets - 10 reps  ASSESSMENT:  CLINICAL IMPRESSION: Pt is a very pleasant 32 year old gentleman who arrives to PT evaluation on this date for thoracic pain. Pt reports difficulty with prolonged activities and positioning due to pain. During today's session pt demonstrates limitations in [facility] mobility and postural deficits which are likely contributing to difficulty with aforementioned activities. Concordant pain able to be reproduced with palpation of trigger points in lower trap, middle trap, and rhomboids on R side. Tolerates HEP well without issue. Recommend skilled PT to address aforementioned deficits to improve functional independence/tolerance. Pt departs today's session in no acute distress, all voiced questions/concerns addressed appropriately from PT perspective.    OBJECTIVE IMPAIRMENTS: decreased activity tolerance, decreased endurance, decreased mobility, decreased ROM, decreased strength, postural dysfunction, and pain.   ACTIVITY LIMITATIONS: sitting, standing, sleeping, and locomotion level  PARTICIPATION LIMITATIONS: driving, community activity, and occupation  PERSONAL FACTORS: Time since onset of injury/illness/exacerbation and 1-2 comorbidities: DM1, depression/anxiety  are  also affecting patient's functional outcome.   REHAB POTENTIAL: Good  CLINICAL DECISION MAKING: Stable/uncomplicated  EVALUATION COMPLEXITY: Low   GOALS: Goals reviewed with patient? No  SHORT TERM GOALS: Target date: 04/14/2023 Pt will demonstrate appropriate understanding and performance of initially prescribed HEP in order to facilitate improved independence with management of symptoms.  Baseline: HEP provided on eval Goal status: INITIAL   2.  Pt will endorse 25% improvement in pain on NPS in order to facilitate improved tolerance with daily activities.  Baseline: 1-6/10 Goal status: INITIAL    LONG TERM GOALS: Target date: 05/12/2023    Pt will demonstrate grossly symmetrical lumbar rotation AROM without painin order to demonstrate improved tolerance to functional movement patterns.  Baseline: see ROM chart  Goal status: INITIAL  2.  Pt will report at least 50% decrease in overall pain levels in past week in order to facilitate improved tolerance to basic ADLs/mobility.   Baseline: 1-6/10  Goal status: INITIAL    3. Pt will demonstrate grip strength within 5kg of contralateral limb in order to facilitate improved functional strength.  Baseline: see MMT chart above  Goal status: INITIAL  4. Pt will report ability to drive for up to an hour with less than or equal to 3/10 pain on NPS in order to facilitate improved tolerance to community navigation.  Baseline: variable, increased pain with driving  Goal status: INITIAL  5. Pt will demonstrate appropriate performance  of final prescribed HEP in order to facilitate improved self-management of symptoms post-discharge.   Baseline: initial HEP prescribed  Goal status: INITIAL    PLAN:  PT FREQUENCY: 2x/week  PT DURATION: 8 weeks  PLANNED INTERVENTIONS: Therapeutic exercises, Therapeutic activity, Neuromuscular re-education, Balance training, Gait training, Patient/Family education, Self Care, Joint mobilization, Joint  manipulation, Stair training, Aquatic Therapy, Dry Needling, Spinal manipulation, Spinal mobilization, Cryotherapy, Moist heat, Taping, Manual therapy, and Re-evaluation.  PLAN FOR NEXT SESSION: Review/update HEP PRN. Work on [employer] exercises as appropriate with emphasis on postural endurance and strength. Symptom modification strategies as indicated/appropriate.    Ashley Murrain PT, DPT 03/17/2023 5:00 PM    Check all possible CPT codes: 16109 - PT Re-evaluation, 97110- Therapeutic Exercise, [phone]- Neuro Re-education, [phone] - Gait Training, [phone] - Manual Therapy, [phone] - Therapeutic Activities, [phone] - Self Care, [phone] - Physical performance training, and [phone] - Aquatic therapy    Check all conditions that are expected to impact treatment: Diabetes mellitus and Musculoskeletal disorders   If treatment provided at initial evaluation, no treatment charged due to lack of authorization.

## 2023-03-17 ENCOUNTER — Other Ambulatory Visit: Payer: Self-pay

## 2023-03-17 ENCOUNTER — Ambulatory Visit: Payer: Medicaid Other | Attending: Family Medicine | Admitting: Physical Therapy

## 2023-03-17 ENCOUNTER — Encounter: Payer: Self-pay | Admitting: Physical Therapy

## 2023-03-17 DIAGNOSIS — M546 Pain in thoracic spine: Secondary | ICD-10-CM | POA: Diagnosis present

## 2023-03-17 DIAGNOSIS — G8929 Other chronic pain: Secondary | ICD-10-CM | POA: Insufficient documentation

## 2023-03-17 DIAGNOSIS — R293 Abnormal posture: Secondary | ICD-10-CM | POA: Insufficient documentation

## 2023-03-20 ENCOUNTER — Other Ambulatory Visit: Payer: Self-pay

## 2023-03-31 ENCOUNTER — Ambulatory Visit: Payer: MEDICAID | Admitting: Physical Therapy

## 2023-04-02 ENCOUNTER — Other Ambulatory Visit: Payer: Self-pay | Admitting: Physician Assistant

## 2023-04-02 ENCOUNTER — Ambulatory Visit: Payer: MEDICAID | Admitting: Physical Therapy

## 2023-04-02 DIAGNOSIS — N529 Male erectile dysfunction, unspecified: Secondary | ICD-10-CM

## 2023-04-07 ENCOUNTER — Ambulatory Visit: Payer: MEDICAID | Admitting: Physical Therapy

## 2023-04-07 NOTE — Therapy (Signed)
OUTPATIENT PHYSICAL THERAPY TREATMENT NOTE   Patient Name: Jonathan Porter MRN: [id number] DOB:1991/04/01, 32 y.o., male Today's Date: 04/08/2023  END OF SESSION:  PT End of Session - 04/08/23 1536     Visit Number 2    Number of Visits 17    Date for PT Re-Evaluation 05/12/23    Authorization Type [place] MCD    PT Start Time 1537    PT Stop Time 1617    PT Time Calculation (min) 40 min    Activity Tolerance Patient tolerated treatment well;No increased pain    Behavior During Therapy WFL for tasks assessed/performed              Past Medical History:  Diagnosis Date   Anxiety    Depression    Diabetes mellitus without complication (HCC)    Gastroparesis    GERD (gastroesophageal reflux disease)    Past Surgical History:  Procedure Laterality Date   NO PAST SURGERIES     UPPER GASTROINTESTINAL ENDOSCOPY     Patient Active Problem List   Diagnosis Date Noted   MDD (major depressive disorder), recurrent episode, moderate (HCC) 09/16/2021   Major depressive disorder, single episode, moderate (HCC) 01/01/2021   Abdominal pain, recurrent 01/01/2021   Marijuana abuse 12/17/2020   Intractable nausea and vomiting 07/03/2019   Gastroparesis 07/02/2019   Influenza vaccine refused 06/03/2019   Erectile dysfunction 06/03/2019   Obesity (BMI 30-39.9) 06/03/2019   Type 1 diabetes mellitus with diabetic polyneuropathy (HCC) 01/21/2019   Type 1 diabetes mellitus with hyperglycemia (HCC) 01/21/2019   Paronychia of finger of left hand 01/20/2017   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) 04/18/2016   Diabetes type 1, uncontrolled 08/23/2015   Onychomycosis of toenail 08/23/2015   Phimosis 08/07/2014    PCP: Marcine Matar, MD  REFERRING PROVIDER: Hoy Register, MD  REFERRING DIAG: [phone] (ICD-10-CM) - Chronic right-sided thoracic back pain  Rationale for Evaluation and Treatment: Rehabilitation  THERAPY DIAG:  Pain in thoracic spine  Abnormal  posture  ONSET DATE: a couple years  SUBJECTIVE:                                                                                                                                                                                          Per eval - Pt states he has pain all the time, variable based on activity and positioning. Feels better with more neutral positioning.  Cannot recall any specific injury precipitating this episode but does endorse remote injuries and heavy work duties for past few years. Seems like it has been slowly worsening. States his hands occasionally fall asleep (L and  R), has been going on about a year, he endorses history of diabetic neuropathy, unsure if this is related. Denies any other UE referral. denies bowel/bladder symptoms, denies visual changes, denies headaches, denies swallowing issues, denies changes in weight, denies fevers.   SUBJECTIVE STATEMENT: 04/08/2023 Pt states he is doing HEP most days, not really seeing much change yet. No pain at present but did have some pain while waiting in lobby  PERTINENT HISTORY:  DM1 w/ polyneuropathy, depression  PAIN:  Are you having pain: 0/10 Location/description: R upper back, tingling in fingers (digits 4 and 5)  Per eval -  Best-worst over past week: 1-6/10 - aggravating factors: driving, sitting, prolonged activity - Easing factors: repositioning, medication    PRECAUTIONS: None  WEIGHT BEARING RESTRICTIONS: No  FALLS:  Has patient fallen in last 6 months? No  LIVING ENVIRONMENT: About to move into a house, no steps  Lives alone   OCCUPATION: works maintenance at [employer], variable duties, climbing ladders   PLOF: Independent  PATIENT GOALS: trying to have less pain  NEXT MD VISIT: TBD (3-6 months per pt)  OBJECTIVE: (objective measures completed at initial evaluation unless otherwise dated)   DIAGNOSTIC FINDINGS:  No recent imaging in chart, pt denies any imaging  PATIENT SURVEYS:   FOTO 74 current, 73 predicted  SCREENING FOR RED FLAGS: Red flag questioning/screening reassuring    COGNITION: Overall cognitive status: Within functional limits for tasks assessed     SENSATION: Light touch intact B UE all dermatomes  POSTURE: increased posterior lean when unsupported, mild shift to L, increased UT activation BIL  PALPATION: Concordant tenderness R sided low/mid trap and rhomboid major, generalized tightness throughout B thoracolumbar and GH musculature but nontender  LUMBAR ROM:   AROM eval  Flexion 100% mid shin  Extension 100%  Right lateral flexion   Left lateral flexion   Right rotation 100%  Left rotation 75% *   (Blank rows = not tested)  (Key: WFL = within functional limits not formally assessed, * = concordant pain, s = stiffness/stretching sensation, NT = not tested)  RANGE OF MOTION:     Active  Right eval Left eval  Shoulder flexion 155 deg s 161 deg  Functional ER combo C5 s C7  Functional IR combo T3 T4  Knee extension    Ankle dorsiflexion     (Blank rows = not tested) (Key: WFL = within functional limits not formally assessed, * = concordant pain, s = stiffness/stretching sensation, NT = not tested)  Comments:    STRENGTH TESTING:  MMT Right eval Left eval  Shoulder flexion 5 5  Shoulder abduction 5 5  Elbow flexion    Elbow extension    Grip strength (avg) 58 kg 47 kg  Hip flexion    Hip abduction (modified sitting)    Knee flexion    Knee extension    Ankle dorsiflexion    Ankle plantarflexion     (Blank rows = not tested) (Key: WFL = within functional limits not formally assessed, * = concordant pain, s = stiffness/stretching sensation, NT = not tested)  Comments:     FUNCTIONAL TESTS:  Mildlylimited overhead reach R compared to L     TODAY'S TREATMENT:  [facility] Adult PT Treatment:                                                 DATE: 04/08/23 Therapeutic Exercise: Scapular retraction w 10sec x10 cues for reduced UT compensations Prone shoulder ext x10 cues for setup Standing shoulder ext green band 2x10 cues for reduced UT compensations Unilat green band row 2x10 cues for improved scap protraction with eccentric portion and reduced UT compensation Waiter's carry 3 laps BIL 5# KB Red band W 2x10 cues for posture and elbow positioning HEP update + education   [facility] Adult PT Treatment:                                                DATE: 03/17/23 Therapeutic Exercise: Scapular retraction w/ 10 sec hold x10 cues for mechanics/form Prone shoulder ext for low trap x10  HEP education   PATIENT EDUCATION:  Education details: rationale for interventions, HEP  Person educated: Patient Education method: Explanation, Demonstration, Tactile cues, Verbal cues Education comprehension: verbalized understanding, returned demonstration, verbal cues required, tactile cues required, and needs further education    HOME EXERCISE PROGRAM: Access Code: [email or web address] URL: [email or web address] Date: 04/08/2023 Prepared by: Fransisco Hertz  Exercises - Standing Single Arm Row with Resistance Thumb Up  - 2-3 x daily - 7 x weekly - 1 sets - 10 reps - Single Arm Shoulder Extension with Anchored Resistance  - 2-3 x daily - 7 x weekly - 1 sets - 10 reps  ASSESSMENT:  CLINICAL IMPRESSION: 04/08/2023 Pt arrives w/o pain today, no numbness/tingling. Today working on expansion of program targeting GH/periscapular musculature for strength/mechanics and improved postural endurance. Demonstrates tendency towards compensations with upper traps and increased kyphosis although this is able to be improved w/ tactile/verbal cues as needed. Tolerates session well with some fatigue but no increase in resting pain. Recommend continuing along current POC in order to address relevant deficits and improve  functional tolerance. Goals remain ongoing as expected with today as first treatment session. Pt departs today's session in no acute distress, all voiced questions/concerns addressed appropriately from PT perspective.    Per eval - Pt is a very pleasant 32 year old gentleman who arrives to PT evaluation on this date for thoracic pain. Pt reports difficulty with prolonged activities and positioning due to pain. During today's session pt demonstrates limitations in [facility] mobility and postural deficits which are likely contributing to difficulty with aforementioned activities. Concordant pain able to be reproduced with palpation of trigger points in lower trap, middle trap, and rhomboids on R side. Tolerates HEP well without issue. Recommend skilled PT to address aforementioned deficits to improve functional independence/tolerance. Pt departs today's session in no acute distress, all voiced questions/concerns addressed appropriately from PT perspective.    OBJECTIVE IMPAIRMENTS: decreased activity tolerance, decreased endurance, decreased mobility, decreased ROM, decreased strength, postural dysfunction, and pain.   ACTIVITY LIMITATIONS: sitting, standing, sleeping, and locomotion level  PARTICIPATION LIMITATIONS: driving, community activity, and occupation  PERSONAL FACTORS: Time since onset of injury/illness/exacerbation and 1-2 comorbidities: DM1, depression/anxiety  are also affecting patient's functional outcome.   REHAB POTENTIAL: Good  CLINICAL DECISION MAKING: Stable/uncomplicated  EVALUATION COMPLEXITY: Low   GOALS: Goals reviewed with patient? No  SHORT TERM GOALS: Target date: 04/14/2023 Pt will demonstrate appropriate understanding and performance of initially prescribed HEP in order to facilitate improved independence with management of symptoms.  Baseline: HEP provided on eval 04/08/23: good HEP adherence reported, performing most days Goal status: ONGOING  2.  Pt will endorse 25%  improvement in pain on NPS in order to facilitate improved tolerance with daily activities.  Baseline: 1-6/10 04/08/23: no pain at present, pain while sitting in lobby Goal status: ONGOING  LONG TERM GOALS: Target date: 05/12/2023    Pt will demonstrate grossly symmetrical lumbar rotation AROM without painin order to demonstrate improved tolerance to functional movement patterns.  Baseline: see ROM chart  Goal status: INITIAL  2.  Pt will report at least 50% decrease in overall pain levels in past week in order to facilitate improved tolerance to basic ADLs/mobility.   Baseline: 1-6/10  Goal status: INITIAL    3. Pt will demonstrate grip strength within 5kg of contralateral limb in order to facilitate improved functional strength.  Baseline: see MMT chart above  Goal status: INITIAL  4. Pt will report ability to drive for up to an hour with less than or equal to 3/10 pain on NPS in order to facilitate improved tolerance to community navigation.  Baseline: variable, increased pain with driving  Goal status: INITIAL  5. Pt will demonstrate appropriate performance of final prescribed HEP in order to facilitate improved self-management of symptoms post-discharge.   Baseline: initial HEP prescribed  Goal status: INITIAL    PLAN:  PT FREQUENCY: 2x/week  PT DURATION: 8 weeks  PLANNED INTERVENTIONS: Therapeutic exercises, Therapeutic activity, Neuromuscular re-education, Balance training, Gait training, Patient/Family education, Self Care, Joint mobilization, Joint manipulation, Stair training, Aquatic Therapy, Dry Needling, Spinal manipulation, Spinal mobilization, Cryotherapy, Moist heat, Taping, Manual therapy, and Re-evaluation.  PLAN FOR NEXT SESSION: Review/update HEP PRN. Work on [employer] exercises as appropriate with emphasis on postural endurance and strength. Symptom modification strategies as indicated/appropriate.     Ashley Murrain PT, DPT 04/08/2023 4:23 PM    Check  all possible CPT codes: 86578 - PT Re-evaluation, 97110- Therapeutic Exercise, [phone]- Neuro Re-education, [phone] - Gait Training, [phone] - Manual Therapy, [phone] - Therapeutic Activities, [phone] - Self Care, [phone] - Physical performance training, and [phone] - Aquatic therapy    Check all conditions that are expected to impact treatment: Diabetes mellitus and Musculoskeletal disorders   If treatment provided at initial evaluation, no treatment charged due to lack of authorization.

## 2023-04-08 ENCOUNTER — Encounter: Payer: Self-pay | Admitting: Physical Therapy

## 2023-04-08 ENCOUNTER — Ambulatory Visit: Payer: MEDICAID | Attending: Family Medicine | Admitting: Physical Therapy

## 2023-04-08 DIAGNOSIS — R293 Abnormal posture: Secondary | ICD-10-CM | POA: Diagnosis present

## 2023-04-08 DIAGNOSIS — M546 Pain in thoracic spine: Secondary | ICD-10-CM | POA: Insufficient documentation

## 2023-04-09 ENCOUNTER — Encounter: Payer: Medicaid Other | Admitting: Physical Therapy

## 2023-04-14 ENCOUNTER — Encounter: Payer: Medicaid Other | Admitting: Physical Therapy

## 2023-04-14 NOTE — Therapy (Addendum)
OUTPATIENT PHYSICAL THERAPY TREATMENT NOTE + NO VISIT DISCHARGE SUMMARY (see below)    Patient Name: Jonathan Porter MRN: [id number] DOB:Apr 09, 1991, 32 y.o., male Today's Date: 04/15/2023  END OF SESSION:  PT End of Session - 04/15/23 1531     Visit Number 3    Number of Visits 17    Date for PT Re-Evaluation 05/12/23    Authorization Type [place] [facility]    Authorization Time Period submitted for 12 visits starting 07/17    Authorization - Visit Number 2    Authorization - Number of Visits 12    PT Start Time 1531    PT Stop Time 1612    PT Time Calculation (min) 41 min    Activity Tolerance Patient tolerated treatment well;No increased pain    Behavior During Therapy WFL for tasks assessed/performed               Past Medical History:  Diagnosis Date   Anxiety    Depression    Diabetes mellitus without complication (HCC)    Gastroparesis    GERD (gastroesophageal reflux disease)    Past Surgical History:  Procedure Laterality Date   NO PAST SURGERIES     UPPER GASTROINTESTINAL ENDOSCOPY     Patient Active Problem List   Diagnosis Date Noted   MDD (major depressive disorder), recurrent episode, moderate (HCC) 09/16/2021   Major depressive disorder, single episode, moderate (HCC) 01/01/2021   Abdominal pain, recurrent 01/01/2021   Marijuana abuse 12/17/2020   Intractable nausea and vomiting 07/03/2019   Gastroparesis 07/02/2019   Influenza vaccine refused 06/03/2019   Erectile dysfunction 06/03/2019   Obesity (BMI 30-39.9) 06/03/2019   Type 1 diabetes mellitus with diabetic polyneuropathy (HCC) 01/21/2019   Type 1 diabetes mellitus with hyperglycemia (HCC) 01/21/2019   Paronychia of finger of left hand 01/20/2017   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) 04/18/2016   Diabetes type 1, uncontrolled 08/23/2015   Onychomycosis of toenail 08/23/2015   Phimosis 08/07/2014    PCP: Marcine Matar, MD  REFERRING PROVIDER: Hoy Register, MD  REFERRING  DIAG: [phone] (ICD-10-CM) - Chronic right-sided thoracic back pain  Rationale for Evaluation and Treatment: Rehabilitation  THERAPY DIAG:  Pain in thoracic spine  Abnormal posture  ONSET DATE: a couple years  SUBJECTIVE:                                                                                                                                                                                          Per eval - Pt states he has pain all the time, variable based on activity and positioning. Feels better with more neutral  positioning.  Cannot recall any specific injury precipitating this episode but does endorse remote injuries and heavy work duties for past few years. Seems like it has been slowly worsening. States his hands occasionally fall asleep (L and R), has been going on about a year, he endorses history of diabetic neuropathy, unsure if this is related. Denies any other UE referral. denies bowel/bladder symptoms, denies visual changes, denies headaches, denies swallowing issues, denies changes in weight, denies fevers.   SUBJECTIVE STATEMENT: 04/15/2023 Pt states he just got off work. Felt good after last session, feels HEP seems to be helping some. No pain at present, says he hasn't really noticed any tingling since last session   PERTINENT HISTORY:  DM1 w/ polyneuropathy, depression  PAIN:  Are you having pain: 0/10 Location/description: R upper back, tingling in fingers (digits 4 and 5)  Per eval -  Best-worst over past week: 1-6/10 - aggravating factors: driving, sitting, prolonged activity - Easing factors: repositioning, medication    PRECAUTIONS: None  WEIGHT BEARING RESTRICTIONS: No  FALLS:  Has patient fallen in last 6 months? No  LIVING ENVIRONMENT: About to move into a house, no steps  Lives alone   OCCUPATION: works maintenance at [employer], variable duties, climbing ladders   PLOF: Independent  PATIENT GOALS: trying to have less  pain  NEXT MD VISIT: TBD (3-6 months per pt)  OBJECTIVE: (objective measures completed at initial evaluation unless otherwise dated)   DIAGNOSTIC FINDINGS:  No recent imaging in chart, pt denies any imaging  PATIENT SURVEYS:  FOTO 74 current, 73 predicted  SCREENING FOR RED FLAGS: Red flag questioning/screening reassuring    COGNITION: Overall cognitive status: Within functional limits for tasks assessed     SENSATION: Light touch intact B UE all dermatomes  POSTURE: increased posterior lean when unsupported, mild shift to L, increased UT activation BIL  PALPATION: Concordant tenderness R sided low/mid trap and rhomboid major, generalized tightness throughout B thoracolumbar and GH musculature but nontender  LUMBAR ROM:   AROM eval  Flexion 100% mid shin  Extension 100%  Right lateral flexion   Left lateral flexion   Right rotation 100%  Left rotation 75% *   (Blank rows = not tested)  (Key: WFL = within functional limits not formally assessed, * = concordant pain, s = stiffness/stretching sensation, NT = not tested)  RANGE OF MOTION:     Active  Right eval Left eval  Shoulder flexion 155 deg s 161 deg  Functional ER combo C5 s C7  Functional IR combo T3 T4  Knee extension    Ankle dorsiflexion     (Blank rows = not tested) (Key: WFL = within functional limits not formally assessed, * = concordant pain, s = stiffness/stretching sensation, NT = not tested)  Comments:    STRENGTH TESTING:  MMT Right eval Left eval  Shoulder flexion 5 5  Shoulder abduction 5 5  Elbow flexion    Elbow extension    Grip strength (avg) 58 kg 47 kg  Hip flexion    Hip abduction (modified sitting)    Knee flexion    Knee extension    Ankle dorsiflexion    Ankle plantarflexion     (Blank rows = not tested) (Key: WFL = within functional limits not formally assessed, * = concordant pain, s = stiffness/stretching sensation, NT = not tested)  Comments:     FUNCTIONAL TESTS:   Mildlylimited overhead reach R compared to L     TODAY'S TREATMENT:  [facility] Adult PT Treatment:                                                DATE: 04/15/23 Therapeutic Exercise: CC row 7# each UE 2x10 cues for posture  CC high>low row 7# unilat each UE 2x10 cues for scapular mechanics  5# waiter carry x129ft each UE, 10# 2x157ft each UE Seated fwd chest press 5# x10, cues for posture Fwd chest press + STS from lowest 2x10 5# Red band double ER + scap retraction x15, green band x15 Cat/camel x12 cues for comfortable ROM    [facility] Adult PT Treatment:                                                DATE: 04/08/23 Therapeutic Exercise: Scapular retraction w 10sec x10 cues for reduced UT compensations Prone shoulder ext x10 cues for setup Standing shoulder ext green band 2x10 cues for reduced UT compensations Unilat green band row 2x10 cues for improved scap protraction with eccentric portion and reduced UT compensation Waiter's carry 3 laps BIL 5# KB Red band W 2x10 cues for posture and elbow positioning HEP update + education   [facility] Adult PT Treatment:                                                DATE: 03/17/23 Therapeutic Exercise: Scapular retraction w/ 10 sec hold x10 cues for mechanics/form Prone shoulder ext for low trap x10  HEP education   PATIENT EDUCATION:  Education details: rationale for interventions, HEP  Person educated: Patient Education method: Explanation, Demonstration, Tactile cues, Verbal cues Education comprehension: verbalized understanding, returned demonstration, verbal cues required, tactile cues required, and needs further education    HOME EXERCISE PROGRAM: Access Code: [email or web address] URL: [email or web address] Date: 04/08/2023 Prepared by: Fransisco Hertz  Exercises - Standing Single Arm Row with Resistance Thumb Up  -  2-3 x daily - 7 x weekly - 1 sets - 10 reps - Single Arm Shoulder Extension with Anchored Resistance  - 2-3 x daily - 7 x weekly - 1 sets - 10 reps  ASSESSMENT:  CLINICAL IMPRESSION: 04/15/2023 Pt arrives w/o pain, notes symptoms seem to be improving and HEP going well. Continues to progress well with increased volume/resistance as appropriate for periscapular strengthening, cues as above. Most difficulty with extension portion of cat/camel, no pain but endorses stiffness. No adverse events, reports good response at end of session. Recommend continuing along current POC in order to address relevant deficits and improve functional tolerance. Pt departs today's session in no acute distress, all voiced questions/concerns addressed appropriately from PT perspective.      Per eval - Pt is a very pleasant 32 year old gentleman who arrives to PT evaluation on this date for thoracic pain. Pt reports difficulty with prolonged activities and positioning due to pain. During today's session pt demonstrates limitations in [facility] mobility and postural deficits which are likely contributing to difficulty with aforementioned activities. Concordant pain able to be reproduced with palpation of trigger points in lower trap, middle trap, and rhomboids on R side. Tolerates  HEP well without issue. Recommend skilled PT to address aforementioned deficits to improve functional independence/tolerance. Pt departs today's session in no acute distress, all voiced questions/concerns addressed appropriately from PT perspective.    OBJECTIVE IMPAIRMENTS: decreased activity tolerance, decreased endurance, decreased mobility, decreased ROM, decreased strength, postural dysfunction, and pain.   ACTIVITY LIMITATIONS: sitting, standing, sleeping, and locomotion level  PARTICIPATION LIMITATIONS: driving, community activity, and occupation  PERSONAL FACTORS: Time since onset of injury/illness/exacerbation and 1-2 comorbidities: DM1,  depression/anxiety  are also affecting patient's functional outcome.   REHAB POTENTIAL: Good  CLINICAL DECISION MAKING: Stable/uncomplicated  EVALUATION COMPLEXITY: Low   GOALS: Goals reviewed with patient? No  SHORT TERM GOALS: Target date: 04/14/2023 Pt will demonstrate appropriate understanding and performance of initially prescribed HEP in order to facilitate improved independence with management of symptoms.  Baseline: HEP provided on eval 04/08/23: good HEP adherence reported, performing most days Goal status: ONGOING  2.  Pt will endorse 25% improvement in pain on NPS in order to facilitate improved tolerance with daily activities.  Baseline: 1-6/10 04/08/23: no pain at present, pain while sitting in lobby 04/15/23: 0-5/10 Goal status: ONGOING  LONG TERM GOALS: Target date: 05/12/2023    Pt will demonstrate grossly symmetrical lumbar rotation AROM without painin order to demonstrate improved tolerance to functional movement patterns.  Baseline: see ROM chart  Goal status: INITIAL  2.  Pt will report at least 50% decrease in overall pain levels in past week in order to facilitate improved tolerance to basic ADLs/mobility.   Baseline: 1-6/10  Goal status: INITIAL    3. Pt will demonstrate grip strength within 5kg of contralateral limb in order to facilitate improved functional strength.  Baseline: see MMT chart above  Goal status: INITIAL  4. Pt will report ability to drive for up to an hour with less than or equal to 3/10 pain on NPS in order to facilitate improved tolerance to community navigation.  Baseline: variable, increased pain with driving  Goal status: INITIAL  5. Pt will demonstrate appropriate performance of final prescribed HEP in order to facilitate improved self-management of symptoms post-discharge.   Baseline: initial HEP prescribed  Goal status: INITIAL    PLAN:  PT FREQUENCY: 2x/week  PT DURATION: 8 weeks  PLANNED INTERVENTIONS: Therapeutic  exercises, Therapeutic activity, Neuromuscular re-education, Balance training, Gait training, Patient/Family education, Self Care, Joint mobilization, Joint manipulation, Stair training, Aquatic Therapy, Dry Needling, Spinal manipulation, Spinal mobilization, Cryotherapy, Moist heat, Taping, Manual therapy, and Re-evaluation.  PLAN FOR NEXT SESSION: Review/update HEP PRN. Work on [employer] exercises as appropriate with emphasis on postural endurance and strength. Symptom modification strategies as indicated/appropriate.     Ashley Murrain PT, DPT 04/15/2023 4:17 PM    Check all possible CPT codes: 16109 - PT Re-evaluation, 97110- Therapeutic Exercise, [phone]- Neuro Re-education, [phone] - Gait Training, [phone] - Manual Therapy, [phone] - Therapeutic Activities, [phone] - Self Care, [phone] - Physical performance training, and [phone] - Aquatic therapy    Check all conditions that are expected to impact treatment: Diabetes mellitus and Musculoskeletal disorders   If treatment provided at initial evaluation, no treatment charged due to lack of authorization.       Discharge addendum 07/27/2023  PHYSICAL THERAPY DISCHARGE SUMMARY  Visits from Start of Care: 3  Current functional level related to goals / functional outcomes: Unable to be assessed   Remaining deficits: Unable to be assessed   Education / Equipment: Unable to be assessed  Patient goals were unable to be assessed.  Patient is being discharged due to not returning since the last visit.     Ashley Murrain PT, DPT 07/27/2023 9:03 AM

## 2023-04-15 ENCOUNTER — Encounter: Payer: Self-pay | Admitting: Physical Therapy

## 2023-04-15 ENCOUNTER — Ambulatory Visit: Payer: MEDICAID | Admitting: Physical Therapy

## 2023-04-15 DIAGNOSIS — M546 Pain in thoracic spine: Secondary | ICD-10-CM

## 2023-04-15 DIAGNOSIS — R293 Abnormal posture: Secondary | ICD-10-CM

## 2023-04-16 ENCOUNTER — Encounter: Payer: Medicaid Other | Admitting: Physical Therapy

## 2023-04-16 ENCOUNTER — Ambulatory Visit: Payer: MEDICAID | Admitting: Physical Therapy

## 2023-04-16 ENCOUNTER — Telehealth: Payer: Self-pay | Admitting: Physical Therapy

## 2023-04-16 NOTE — Telephone Encounter (Signed)
Called pt re: this afternoon's missed appt - left voicemail with office call back number, confirmed date/time of next appt. Encouraged to reach out with any questions or concerns

## 2023-04-21 ENCOUNTER — Encounter: Payer: Medicaid Other | Admitting: Physical Therapy

## 2023-04-23 ENCOUNTER — Encounter: Payer: Self-pay | Admitting: Physical Therapy

## 2023-04-23 ENCOUNTER — Telehealth: Payer: Self-pay | Admitting: Physical Therapy

## 2023-04-23 ENCOUNTER — Ambulatory Visit: Payer: MEDICAID | Attending: Family Medicine | Admitting: Physical Therapy

## 2023-04-23 NOTE — Telephone Encounter (Signed)
Called pt re: this afternoon's missed appt - he notes that work has gotten really busy and he hasn't had time to give [place] a call but overall he is doing well. Advised on attendance policy and that we will only be able to schedule one visit at a time for now given 2 missed appts - he verbalizes agreement/understanding. No visits scheduled at this time, he requests having office reach out to help him schedule next appointment. Denies further questions/concerns at this time

## 2023-05-14 ENCOUNTER — Ambulatory Visit: Payer: Medicaid Other | Admitting: Internal Medicine

## 2023-05-18 ENCOUNTER — Other Ambulatory Visit: Payer: Self-pay

## 2023-05-26 ENCOUNTER — Other Ambulatory Visit: Payer: Self-pay

## 2023-05-27 ENCOUNTER — Other Ambulatory Visit: Payer: Self-pay

## 2023-05-29 ENCOUNTER — Other Ambulatory Visit: Payer: Self-pay

## 2023-06-08 ENCOUNTER — Encounter (HOSPITAL_BASED_OUTPATIENT_CLINIC_OR_DEPARTMENT_OTHER): Payer: MEDICAID | Admitting: Internal Medicine

## 2023-06-08 DIAGNOSIS — G5603 Carpal tunnel syndrome, bilateral upper limbs: Secondary | ICD-10-CM

## 2023-06-09 ENCOUNTER — Other Ambulatory Visit: Payer: Self-pay | Admitting: Internal Medicine

## 2023-06-09 DIAGNOSIS — Z3141 Encounter for fertility testing: Secondary | ICD-10-CM

## 2023-06-15 ENCOUNTER — Ambulatory Visit
Admission: EM | Admit: 2023-06-15 | Discharge: 2023-06-15 | Discharge status: Against Medical Advice | Payer: MEDICAID | Attending: Physician Assistant | Admitting: Physician Assistant

## 2023-06-15 DIAGNOSIS — E162 Hypoglycemia, unspecified: Secondary | ICD-10-CM

## 2023-06-15 LAB — POCT URINALYSIS DIP (MANUAL ENTRY)
Bilirubin, UA: NEGATIVE
Glucose, UA: NEGATIVE mg/dL
Leukocytes, UA: NEGATIVE
Nitrite, UA: NEGATIVE
Protein Ur, POC: NEGATIVE mg/dL
Spec Grav, UA: 1.025 (ref 1.010–1.025)
Urobilinogen, UA: 1 E.U./dL
pH, UA: 6 (ref 5.0–8.0)

## 2023-06-15 LAB — POCT FASTING CBG KUC MANUAL ENTRY: POCT Glucose (KUC): 48 mg/dL — AB (ref 70–99)

## 2023-06-15 MED ORDER — PREGABALIN 25 MG PO CAPS: 25.0000 mg | ORAL_CAPSULE | Freq: Two times a day (BID) | ORAL | 1 refills | Status: DC

## 2023-06-15 NOTE — ED Provider Notes (Signed)
Patient presents today for evaluation of nausea, bloating, vomiting and loose stools.  He reports these are typical with his gastroparesis.  Recommended further evaluation in emergency room for stat labs, etc. when he had low glucose level in office, patient adamantly refuses.  AMA form signed.   Tomi Bamberger, PA-C 06/15/23 1017

## 2023-06-15 NOTE — ED Triage Notes (Signed)
"  I am having stomach problems/pain, this has been for about a few weeks, it begins with nausea, bloating then vomiting once in a while, and loose stools/diarrhea recently and continual". Last void (0830, no concerns, no dysuria, no urinary frequency/urgency). "I have reached out to my PCP and unable to get same day visit. Last Glucose: "214".

## 2023-06-15 NOTE — Telephone Encounter (Signed)
Please see the MyChart message reply(ies) for my assessment and plan.    This patient gave consent for this Medical Advice Message and is aware that it may result in a bill to [employer], as well as the possibility of receiving a bill for a co-payment or deductible. They are an established patient, but are not seeking medical advice exclusively about a problem treated during an in person or video visit in the last seven days. I did not recommend an in person or video visit within seven days of my reply.    I spent a total of 7 minutes cumulative time within 7 days through [employer].  Jonah Blue, MD

## 2023-06-19 ENCOUNTER — Other Ambulatory Visit: Payer: Self-pay

## 2023-06-19 DIAGNOSIS — R202 Paresthesia of skin: Secondary | ICD-10-CM

## 2023-06-22 ENCOUNTER — Ambulatory Visit (INDEPENDENT_AMBULATORY_CARE_PROVIDER_SITE_OTHER): Payer: MEDICAID | Admitting: Neurology

## 2023-06-22 DIAGNOSIS — R202 Paresthesia of skin: Secondary | ICD-10-CM | POA: Diagnosis not present

## 2023-06-22 DIAGNOSIS — G629 Polyneuropathy, unspecified: Secondary | ICD-10-CM

## 2023-06-22 DIAGNOSIS — G5601 Carpal tunnel syndrome, right upper limb: Secondary | ICD-10-CM

## 2023-06-22 NOTE — Procedures (Signed)
[facility]  [address] [place], [address]  [place], [place] [address] Tel: [phone] Fax: [phone] Test Date:  06/22/2023  Patient: Jonathan Porter DOB: Jan 23, 1991 Physician: Jacquelyne Balint, MD  Sex: Male Height: 5\' 11"  Ref Phys: Jonah Blue, MD  ID#: [id number]   Technician:    History: This is a 32 year old male with burning, numbness, and tingling in his hands.  NCV & EMG Findings: Extensive electrodiagnostic evaluation of the right and left upper limbs shows: Right median sensory response shows prolonged distal peak latency (3.6 ms) and reduced amplitude (9 V). Left median sensory response shows reduced amplitude (11 V). Bilateral ulnar sensory responses show reduced amplitude (right 8 V, left 8 V). Bilateral radial sensory responses show reduced amplitudes (right 8 V, left 13 V). Left median-ulnar palmar sensory responses are within normal limits. Bilateral median (APB) and bilateral ulnar (ADM) motor responses are within normal limits. There is no evidence of active or chronic motor axon loss changes affecting any of the tested muscles on needle examination. Motor unit configuration and recruitment pattern is within normal limits.  Impression: This is an abnormal study. The findings are most consistent with the following: Evidence of a right median mononeuropathy at or distal to the wrist, consistent with carpal tunnel syndrome, mild in degree electrically. No electrodiagnostic evidence of a left median mononeuropathy at or distal to the wrist, consistent with carpal tunnel syndrome. 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 No electrodiagnostic evidence of a right or left cervical (C5-T1) motor radiculopathy.    ___________________________ Jacquelyne Balint, MD    Nerve Conduction Studies Motor Nerve Results    Latency Amplitude F-Lat Segment  Distance CV Comment  Site (ms) Norm (mV) Norm (ms)  (cm) (m/s) Norm   Left Median (APB) Motor  Wrist 3.0  < 3.9 13.3  > 6.0        Elbow 8.9 - 12.8 -  Elbow-Wrist 31 53  > 50   Right Median (APB) Motor  Wrist 3.5  < 3.9 11.5  > 6.0        Elbow 9.2 - 11.6 -  Elbow-Wrist 29 51  > 50   Left Ulnar (ADM) Motor  Wrist 2.4  < 3.1 10.0  > 7.0        Bel elbow 7.0 - 9.4 -  Bel elbow-Wrist 23.5 51  > 50   Ab elbow 8.9 - 8.9 -  Ab elbow-Bel elbow 10 53 -   Right Ulnar (ADM) Motor  Wrist 2.3  < 3.1 10.4  > 7.0        Bel elbow 6.7 - 9.9 -  Bel elbow-Wrist 24 55  > 50   Ab elbow 8.6 - 9.8 -  Ab elbow-Bel elbow 10 53 -    Sensory Sites    Neg Peak Lat Amplitude (O-P) Segment Distance Velocity Comment  Site (ms) Norm (V) Norm  (cm) (ms)   Left Median Sensory  Wrist-Dig II 3.4  < 3.4 *11  > 20 Wrist-Dig II 13    Right Median Sensory  Wrist-Dig II *3.6  < 3.4 *9  > 20 Wrist-Dig II 13    Left Median-Ulnar Palmar Sensory       Median  Palm-Wrist 2.2  < 2.2 32  > 10 Palm-Wrist 8         Ulnar  Palm-Wrist 1.95  < 2.2 9  > 5 Palm-Wrist  8    Left Radial Sensory  Forearm-Wrist 2.2  < 2.7 *13  > 18 Forearm-Wrist 10    Right Radial Sensory  Forearm-Wrist 2.6  < 2.7 *8  > 18 Forearm-Wrist 10    Left Ulnar Sensory  Wrist-Dig V 2.9  < 3.1 *8  > 12 Wrist-Dig V 11    Right Ulnar Sensory  Wrist-Dig V 3.1  < 3.1 *8  > 12 Wrist-Dig V 11     Inter-Nerve Comparisons   Nerve 1 Value 1 Nerve 2 Value 2 Parameter Result Normal  Sensory Sites  L Median Palm-Wrist 2.2 ms L Ulnar Palm-Wrist 1.95 ms Peak Lat Diff 0.25 ms <0.40   Electromyography   Side Muscle Ins.Act Fibs Fasc Recrt Amp Dur Poly Activation Comment  Left FDI Nml Nml Nml Nml Nml Nml Nml Nml N/A  Left EIP Nml Nml Nml Nml Nml Nml Nml Nml N/A  Left FPL Nml Nml Nml Nml Nml Nml Nml Nml N/A  Left APB Nml Nml Nml Nml Nml Nml Nml Nml N/A  Left Pronator teres Nml Nml Nml Nml Nml Nml Nml Nml N/A  Left Biceps Nml Nml Nml Nml Nml Nml Nml Nml N/A  Left  Triceps Nml Nml Nml Nml Nml Nml Nml Nml N/A  Left Deltoid Nml Nml Nml Nml Nml Nml Nml Nml N/A  Left C7 PSP Nml Nml Nml Nml Nml Nml Nml Nml N/A  Right FDI Nml Nml Nml Nml Nml Nml Nml Nml N/A  Right EIP Nml Nml Nml Nml Nml Nml Nml Nml N/A  Right APB Nml Nml Nml Nml Nml Nml Nml Nml N/A  Right Pronator teres Nml Nml Nml Nml Nml Nml Nml Nml N/A  Right Biceps Nml Nml Nml Nml Nml Nml Nml Nml N/A  Right Triceps Nml Nml Nml Nml Nml Nml Nml Nml N/A  Right Deltoid Nml Nml Nml Nml Nml Nml Nml Nml N/A      Waveforms:  Motor           Sensory

## 2023-07-14 ENCOUNTER — Other Ambulatory Visit: Payer: Self-pay

## 2023-07-15 ENCOUNTER — Encounter: Payer: Self-pay | Admitting: Internal Medicine

## 2023-07-16 ENCOUNTER — Other Ambulatory Visit: Payer: Self-pay | Admitting: Internal Medicine

## 2023-07-16 DIAGNOSIS — E1065 Type 1 diabetes mellitus with hyperglycemia: Secondary | ICD-10-CM

## 2023-07-16 MED ORDER — ACCU-CHEK GUIDE VI STRP
ORAL_STRIP | 12 refills | Status: DC
Start: 2023-07-16 — End: 2023-09-02
  Filled 2023-07-16: qty 100, 20d supply, fill #0
  Filled 2023-07-20: qty 100, 25d supply, fill #0
  Filled 2023-07-21: qty 100, 20d supply, fill #0

## 2023-07-17 ENCOUNTER — Other Ambulatory Visit: Payer: Self-pay

## 2023-07-20 ENCOUNTER — Other Ambulatory Visit: Payer: Self-pay

## 2023-07-21 ENCOUNTER — Other Ambulatory Visit: Payer: Self-pay

## 2023-07-27 ENCOUNTER — Other Ambulatory Visit: Payer: Self-pay | Admitting: Internal Medicine

## 2023-07-27 DIAGNOSIS — N529 Male erectile dysfunction, unspecified: Secondary | ICD-10-CM

## 2023-08-13 ENCOUNTER — Encounter: Payer: Self-pay | Admitting: Internal Medicine

## 2023-08-14 ENCOUNTER — Ambulatory Visit: Payer: Medicaid Other | Admitting: Internal Medicine

## 2023-09-01 ENCOUNTER — Encounter: Payer: Self-pay | Admitting: Internal Medicine

## 2023-09-02 ENCOUNTER — Other Ambulatory Visit: Payer: Self-pay

## 2023-09-02 MED ORDER — GLUCOSE BLOOD VI STRP
1.0000 | ORAL_STRIP | 11 refills | Status: DC
  Filled 2023-09-02: qty 100, 20d supply, fill #0
  Filled 2023-10-09: qty 100, 20d supply, fill #1
  Filled 2023-10-25: qty 100, 20d supply, fill #2
  Filled 2023-10-26: qty 100, 20d supply, fill #3
  Filled 2023-11-16: qty 100, 20d supply, fill #0
  Filled 2023-11-16: qty 100, 30d supply, fill #3
  Filled 2023-11-16: qty 100, 20d supply, fill #3
  Filled 2023-12-07: qty 100, 25d supply, fill #1
  Filled 2023-12-31: qty 100, 25d supply, fill #2
  Filled 2024-01-23: qty 100, 25d supply, fill #3
  Filled 2024-02-14: qty 100, 25d supply, fill #4
  Filled 2024-03-10: qty 100, 25d supply, fill #5
  Filled 2024-04-09: qty 100, 20d supply, fill #6
  Filled 2024-05-05: qty 100, 20d supply, fill #7
  Filled 2024-06-01: qty 100, 20d supply, fill #8

## 2023-09-04 ENCOUNTER — Other Ambulatory Visit: Payer: Self-pay

## 2023-09-07 ENCOUNTER — Other Ambulatory Visit: Payer: Self-pay

## 2023-09-07 ENCOUNTER — Encounter: Payer: Self-pay | Admitting: Emergency Medicine

## 2023-09-07 ENCOUNTER — Ambulatory Visit
Admission: EM | Admit: 2023-09-07 | Discharge: 2023-09-07 | Disposition: A | Discharge status: Home / Self Care | Payer: MEDICAID | Attending: Family Medicine | Admitting: Family Medicine

## 2023-09-07 DIAGNOSIS — K3184 Gastroparesis: Secondary | ICD-10-CM

## 2023-09-07 DIAGNOSIS — R112 Nausea with vomiting, unspecified: Secondary | ICD-10-CM

## 2023-09-07 DIAGNOSIS — R109 Unspecified abdominal pain: Secondary | ICD-10-CM

## 2023-09-07 MED ORDER — DICYCLOMINE HCL 20 MG PO TABS: 20.0000 mg | ORAL_TABLET | Freq: Two times a day (BID) | ORAL | 0 refills | Status: DC

## 2023-09-07 NOTE — ED Triage Notes (Addendum)
Pt reports intermittent abdominal pain and emesis episodes x1 month. Pt reports gastroparesis flares with his diabetes. Reports 3-4 vomiting episodes this morning with 3-4 episodes of diarrhea. Describes generalized burning sensation throughout abdomen. Pt requests work note.

## 2023-09-07 NOTE — Discharge Instructions (Addendum)
You were seen today for vomiting and abdominal pain, likely due to your chronic issues.  I have sent out a medication to help with vomiting and cramping.  You may continue the tizanidine as well if helpful.  Follow up with your primary care provider if not improving.

## 2023-09-07 NOTE — ED Provider Notes (Signed)
[facility]    CSN: [id number] Arrival date & time: 09/07/23  0865      History   Chief Complaint Chief Complaint  Patient presents with   Abdominal Pain   Emesis    HPI Jonathan Porter is a 32 y.o. male.    Abdominal Pain Associated symptoms: diarrhea and vomiting   Emesis Associated symptoms: abdominal pain and diarrhea    Patient is here for abd pain, n/v off/on x 1 month.  This morning it flared up, with 3-4 episodes of diarrhea and vomiting.  No pain currently.  He will get a burning sensation throughout the whole abdomen.  He was dx with diabetes at age 58, dx of gastroparesis, and this feels similar.  It flares from time to time.  He does have meds to treat his symptoms, but cannot take them while at work.  He does have tizanidine at home/.  He has been given different meds from time to time.  Thinks the dicyclomine would work the best at this time.  He would like a note for work for today.          Past Medical History:  Diagnosis Date   Anxiety    Depression    Diabetes mellitus without complication (HCC)    Gastroparesis    GERD (gastroesophageal reflux disease)     Patient Active Problem List   Diagnosis Date Noted   MDD (major depressive disorder), recurrent episode, moderate (HCC) 09/16/2021   Major depressive disorder, single episode, moderate (HCC) 01/01/2021   Abdominal pain, recurrent 01/01/2021   Marijuana abuse 12/17/2020   Intractable nausea and vomiting 07/03/2019   Gastroparesis 07/02/2019   Influenza vaccine refused 06/03/2019   Erectile dysfunction 06/03/2019   Obesity (BMI 30-39.9) 06/03/2019   Type 1 diabetes mellitus with diabetic polyneuropathy (HCC) 01/21/2019   Type 1 diabetes mellitus with hyperglycemia (HCC) 01/21/2019   Paronychia of finger of left hand 01/20/2017   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 Authorizing Provider  glucose blood test strip Use to check blood sugars 4 to 5 times per day. 07/16/23  Yes Marcine Matar, MD  insulin aspart (NOVOLOG FLEXPEN) 100 UNIT/ML FlexPen Max daily 45 units Patient taking differently: Max daily 45 units, 12 units just before coming here... 02/11/23  Yes Shamleffer, Konrad Dolores, MD  insulin glargine (LANTUS SOLOSTAR) 100 UNIT/ML Solostar Pen Inject 22 Units into the skin daily. 02/11/23  Yes Shamleffer, Konrad Dolores, MD  pregabalin (LYRICA) 25 MG capsule Take 1 capsule (25 mg total) by mouth 2 (two) times daily. 06/15/23  Yes Marcine Matar, MD  sildenafil (VIAGRA) 50 MG tablet TAKE 1 TABLET BY MOUTH ON AN EMPTY STOMACH 1  HOUR  BEFORE  SEXUAL  ACTIVITY  AS  NEEDED 07/28/23  Yes Marcine Matar, MD  tiZANidine (ZANAFLEX) 4 MG tablet Take 1 tablet (4 mg total) by mouth 2 (two) times daily as needed. 02/23/23  Yes Hoy Register, MD  omeprazole (PRILOSEC) 40 MG capsule Take 1 capsule (40 mg total) by mouth daily with breakfast. Patient not taking: Reported on 09/07/2023 10/29/21 01/28/22  Claiborne Rigg, NP    Family History Family History  Problem Relation Age of Onset   Thyroid disease Mother    Diabetes Maternal Aunt    Diabetes Maternal Grandmother    Diabetes Maternal Grandfather    Thyroid disease Other    Colon cancer Neg Hx    Esophageal cancer Neg Hx    Stomach cancer Neg Hx    Rectal cancer Neg Hx     Social History Social History   Tobacco Use   Smoking status: Every Day    Current packs/day: 0.50    Average packs/day: 0.5 packs/day for 6.0 years (3.0 ttl pk-yrs)    Types: Cigarettes   Smokeless tobacco: Never  Vaping Use   Vaping status: Never Used  Substance Use Topics    Alcohol use: Not Currently    Comment: social    Drug use: Not Currently    Types: Marijuana     Allergies   Shellfish allergy   Review of Systems Review of Systems  Constitutional: Negative.   HENT: Negative.    Respiratory: Negative.    Cardiovascular: Negative.   Gastrointestinal:  Positive for abdominal pain, diarrhea and vomiting.  Musculoskeletal: Negative.   Skin: Negative.   Psychiatric/Behavioral: Negative.       Physical Exam Triage Vital Signs ED Triage Vitals  Encounter Vitals Group     BP 09/07/23 1102 (!) 137/95     Systolic BP Percentile --      Diastolic BP Percentile --      Pulse Rate 09/07/23 1102 69     Resp 09/07/23 1102 18     Temp 09/07/23 1102 (!) 97.5 F (36.4 C)     Temp Source 09/07/23 1102 Oral     SpO2 09/07/23 1102 98 %     Weight --      Height --      Head Circumference --      Peak Flow --      Pain Score 09/07/23 1103 0     Pain Loc --      Pain Education --      Exclude from Growth Chart --    No data found.  Updated Vital Signs BP (!) 137/95 (BP Location: Left Arm)   Pulse 69   Temp (!) 97.5 F (36.4 C) (Oral)   Resp 18   SpO2 98%   Visual Acuity Right Eye Distance:   Left Eye Distance:   Bilateral Distance:    Right Eye Near:   Left Eye Near:    Bilateral Near:     Physical Exam Constitutional:      General: He is not in acute distress.    Appearance: He is well-developed. He is not ill-appearing.  Cardiovascular:     Rate and Rhythm: Normal rate and regular rhythm.  Pulmonary:     Effort: Pulmonary effort is normal.     Breath sounds: Normal breath sounds.  Abdominal:     General: Abdomen is flat. Bowel sounds are normal.     Palpations: Abdomen is soft.     Tenderness: There is no abdominal tenderness.  Skin:    General: Skin is warm.  Neurological:     General: No focal deficit present.     Mental Status: He is alert.  Psychiatric:        Mood and Affect: Mood normal.      UC Treatments  / Results  Labs (all labs ordered are listed, but only abnormal results  are displayed) Labs Reviewed - No data to display  EKG   Radiology No results found.  Procedures Procedures (including critical care time)  Medications Ordered in UC Medications - No data to display  Initial Impression / Assessment and Plan / UC Course  I have reviewed the triage vital signs and the nursing notes.  Pertinent labs & imaging results that were available during my care of the patient were reviewed by me and considered in my medical decision making (see chart for details).    Final Clinical Impressions(s) / UC Diagnoses   Final diagnoses:  Abdominal pain, unspecified abdominal location  Gastroparesis  Nausea and vomiting, unspecified vomiting type     Discharge Instructions      You were seen today for vomiting and abdominal pain, likely due to your chronic issues.  I have sent out a medication to help with vomiting and cramping.  You may continue the tizanidine as well if helpful.  Follow up with your primary care provider if not improving.     ED Prescriptions     Medication Sig Dispense Auth. Provider   dicyclomine (BENTYL) 20 MG tablet Take 1 tablet (20 mg total) by mouth 2 (two) times daily. 20 tablet Jannifer Franklin, MD      PDMP not reviewed this encounter.   Jannifer Franklin, MD 09/07/23 1126

## 2023-09-30 ENCOUNTER — Ambulatory Visit: Payer: MEDICAID | Attending: Physician Assistant | Admitting: Physician Assistant

## 2023-09-30 VITALS — BP 161/81 | HR 84 | Wt 239.6 lb

## 2023-09-30 DIAGNOSIS — Z3189 Encounter for other procreative management: Secondary | ICD-10-CM | POA: Diagnosis not present

## 2023-09-30 DIAGNOSIS — N529 Male erectile dysfunction, unspecified: Secondary | ICD-10-CM | POA: Diagnosis not present

## 2023-09-30 DIAGNOSIS — I10 Essential (primary) hypertension: Secondary | ICD-10-CM | POA: Diagnosis not present

## 2023-09-30 DIAGNOSIS — E1065 Type 1 diabetes mellitus with hyperglycemia: Secondary | ICD-10-CM | POA: Diagnosis not present

## 2023-09-30 LAB — POCT GLYCOSYLATED HEMOGLOBIN (HGB A1C): HbA1c, POC (controlled diabetic range): 7.1 % — AB (ref 0.0–7.0)

## 2023-09-30 LAB — GLUCOSE, POCT (MANUAL RESULT ENTRY): POC Glucose: 106 mg/dL — AB (ref 70–99)

## 2023-09-30 MED ORDER — PREGABALIN 25 MG PO CAPS: 25.0000 mg | ORAL_CAPSULE | Freq: Two times a day (BID) | ORAL | 3 refills | Status: DC

## 2023-09-30 MED ORDER — LISINOPRIL 10 MG PO TABS: 10.0000 mg | ORAL_TABLET | Freq: Every day | ORAL | 3 refills | Status: DC

## 2023-09-30 MED ORDER — NOVOLOG FLEXPEN 100 UNIT/ML ~~LOC~~ SOPN: PEN_INJECTOR | SUBCUTANEOUS | 3 refills | Status: DC

## 2023-09-30 MED ORDER — LANTUS SOLOSTAR 100 UNIT/ML ~~LOC~~ SOPN: 24.0000 [IU] | PEN_INJECTOR | Freq: Every day | SUBCUTANEOUS | 3 refills | Status: DC

## 2023-09-30 MED ORDER — SILDENAFIL CITRATE 50 MG PO TABS
ORAL_TABLET | ORAL | 4 refills | Status: DC
Start: 2023-09-30 — End: 2024-06-01

## 2023-09-30 NOTE — Progress Notes (Signed)
 Patient ID: Jonathan Porter, male   DOB: 05/08/1991, 33 y.o.   MRN: [id number]   Jonathan Porter, is a 33 y.o. male  RDW:[id number]  [id number]  DOB - 20-Feb-1991  Chief Complaint  Patient presents with   Diabetes   Hypertension       Subjective:   Jonathan Porter is a 33 y.o. male here today for diabetes check.  Has not had labs in a long time.  Wants referral to urology to have fertility and ED checked.  His GF is 32 and has PCOS so it was recommended he get checked.  Blood sugars have been <200.  No new issues or concerns otherwise. No problems updated.  ALLERGIES: Allergies  Allergen Reactions   Shellfish Allergy Anaphylaxis    PAST MEDICAL HISTORY: Past Medical History:  Diagnosis Date   Anxiety    Depression    Diabetes mellitus without complication (HCC)    Gastroparesis    GERD (gastroesophageal reflux disease)     MEDICATIONS AT HOME: Prior to Admission medications   Medication Sig Start Date End Date Taking? Authorizing Provider  dicyclomine  (BENTYL ) 20 MG tablet Take 1 tablet (20 mg total) by mouth 2 (two) times daily. 09/07/23  Yes Piontek, Erin, MD  glucose blood test strip Use to check blood sugars 4 to 5 times per day. 07/16/23  Yes Vicci Barnie NOVAK, MD  lisinopril  (ZESTRIL ) 10 MG tablet Take 1 tablet (10 mg total) by mouth daily. 09/30/23  Yes Atiana Levier, Jon HERO, PA-C  tiZANidine  (ZANAFLEX ) 4 MG tablet Take 1 tablet (4 mg total) by mouth 2 (two) times daily as needed. 02/23/23  Yes Newlin, Enobong, MD  insulin  aspart (NOVOLOG  FLEXPEN) 100 UNIT/ML FlexPen Take 10 units 3 times daily with food. Max daily 45 units 09/30/23   Jenaya Saar, Jon HERO, PA-C  insulin  glargine (LANTUS  SOLOSTAR) 100 UNIT/ML Solostar Pen Inject 24 Units into the skin daily. 09/30/23   Jonathan Jon HERO, PA-C  omeprazole  (PRILOSEC) 40 MG capsule Take 1 capsule (40 mg total) by mouth daily with breakfast. Patient not taking: Reported on 09/07/2023 10/29/21 01/28/22  Fleming, Zelda W, NP  pregabalin   (LYRICA ) 25 MG capsule Take 1 capsule (25 mg total) by mouth 2 (two) times daily. 09/30/23   Jonathan Jon HERO, PA-C  sildenafil  (VIAGRA ) 50 MG tablet TAKE 1 TABLET BY MOUTH ON AN EMPTY STOMACH 1  HOUR  BEFORE  SEXUAL  ACTIVITY  AS  NEEDED 09/30/23   Jasemine Nawaz, Jon HERO, PA-C    ROS: Neg HEENT Neg resp Neg cardiac Neg GI Neg GU Neg MS Neg psych Neg neuro  Objective:   Vitals:   09/30/23 1009 09/30/23 1029  BP: (!) 162/109 (!) 161/81  Pulse: 84   SpO2: 100%   Weight: 239 lb 9.6 oz (108.7 kg)    Exam General appearance : Awake, alert, not in any distress. Speech Clear. Not toxic looking HEENT: Atraumatic and Normocephalic Neck: Supple, no JVD. No cervical lymphadenopathy.  Chest: Good air entry bilaterally, CTAB.  No rales/rhonchi/wheezing CVS: S1 S2 regular, no murmurs.  Extremities: B/L Lower Ext shows no edema, both legs are warm to touch Neurology: Awake alert, and oriented X 3, CN II-XII intact, Non focal Skin: No Rash  Data Review Lab Results  Component Value Date   HGBA1C 7.1 (A) 09/30/2023   HGBA1C 7.9 (A) 02/11/2023   HGBA1C 9.5 (A) 12/03/2022    Assessment & Plan   1. Type 1 diabetes mellitus with hyperglycemia (HCC) (Primary) Much improved-will increase lantus   from 22 to 24 units and continue working on diabetic diet - POCT glycosylated hemoglobin (Hb A1C) - POCT glucose (manual entry) - insulin  glargine (LANTUS  SOLOSTAR) 100 UNIT/ML Solostar Pen; Inject 24 Units into the skin daily.  Dispense: 30 mL; Refill: 3 - insulin  aspart (NOVOLOG  FLEXPEN) 100 UNIT/ML FlexPen; Take 10 units 3 times daily with food. Max daily 45 units  Dispense: 45 mL; Refill: 3 - Comprehensive metabolic panel - Lipid Panel - CBC with Differential - lisinopril  (ZESTRIL ) 10 MG tablet; Take 1 tablet (10 mg total) by mouth daily.  Dispense: 90 tablet; Refill: 3  2. Erectile dysfunction, unspecified erectile dysfunction type - sildenafil  (VIAGRA ) 50 MG tablet; TAKE 1 TABLET BY MOUTH ON AN  EMPTY STOMACH 1  HOUR  BEFORE  SEXUAL  ACTIVITY  AS  NEEDED  Dispense: 10 tablet; Refill: 4 - Ambulatory referral to Urology  3. Encounter for fertility planning -take multi vitamin.  Drink adequate water - Ambulatory referral to Urology  4. Hypertension, unspecified type There are several elevated readings on epic and with him having DM, ACE would be helpful - lisinopril  (ZESTRIL ) 10 MG tablet; Take 1 tablet (10 mg total) by mouth daily.  Dispense: 90 tablet; Refill: 3    Return in about 3 months (around 12/29/2023) for PCP for chronic conditions-[facility].  The patient was given clear instructions to go to ER or return to medical center if symptoms don't improve, worsen or new problems develop. The patient verbalized understanding. The patient was told to call to get lab results if they haven't heard anything in the next week.      Jon Moores, PA-C [facility] [place], [place] [phone]   09/30/2023, 10:30 AM

## 2023-10-01 LAB — COMPREHENSIVE METABOLIC PANEL
ALT: 24 [IU]/L (ref 0–44)
AST: 22 [IU]/L (ref 0–40)
Albumin: 4.1 g/dL (ref 4.1–5.1)
Alkaline Phosphatase: 116 [IU]/L (ref 44–121)
BUN/Creatinine Ratio: 12 (ref 9–20)
BUN: 13 mg/dL (ref 6–20)
Bilirubin Total: 0.2 mg/dL (ref 0.0–1.2)
CO2: 26 mmol/L (ref 20–29)
Calcium: 9.4 mg/dL (ref 8.7–10.2)
Chloride: 107 mmol/L — ABNORMAL HIGH (ref 96–106)
Creatinine, Ser: 1.11 mg/dL (ref 0.76–1.27)
Globulin, Total: 2.6 g/dL (ref 1.5–4.5)
Glucose: 75 mg/dL (ref 70–99)
Potassium: 4.2 mmol/L (ref 3.5–5.2)
Sodium: 146 mmol/L — ABNORMAL HIGH (ref 134–144)
Total Protein: 6.7 g/dL (ref 6.0–8.5)
eGFR: 89 mL/min/{1.73_m2} (ref >59)

## 2023-10-01 LAB — CBC WITH DIFFERENTIAL/PLATELET
Basophils Absolute: 0.1 10*3/uL (ref 0.0–0.2)
Basos: 1 %
EOS (ABSOLUTE): 1.1 10*3/uL — ABNORMAL HIGH (ref 0.0–0.4)
Eos: 13 %
Hematocrit: 46.8 % (ref 37.5–51.0)
Hemoglobin: 15.6 g/dL (ref 13.0–17.7)
Immature Grans (Abs): 0 10*3/uL (ref 0.0–0.1)
Immature Granulocytes: 0 %
Lymphocytes Absolute: 3.2 10*3/uL — ABNORMAL HIGH (ref 0.7–3.1)
Lymphs: 40 %
MCH: 27.9 pg (ref 26.6–33.0)
MCHC: 33.3 g/dL (ref 31.5–35.7)
MCV: 84 fL (ref 79–97)
Monocytes Absolute: 0.6 10*3/uL (ref 0.1–0.9)
Monocytes: 7 %
Neutrophils Absolute: 3.1 10*3/uL (ref 1.4–7.0)
Neutrophils: 39 %
Platelets: 284 10*3/uL (ref 150–450)
RBC: 5.6 x10E6/uL (ref 4.14–5.80)
RDW: 13.6 % (ref 11.6–15.4)
WBC: 8.1 10*3/uL (ref 3.4–10.8)

## 2023-10-01 LAB — LIPID PANEL
Chol/HDL Ratio: 2.9 {ratio} (ref 0.0–5.0)
Cholesterol, Total: 173 mg/dL (ref 100–199)
HDL: 60 mg/dL (ref >39)
LDL Chol Calc (NIH): 101 mg/dL — ABNORMAL HIGH (ref 0–99)
Triglycerides: 63 mg/dL (ref 0–149)
VLDL Cholesterol Cal: 12 mg/dL (ref 5–40)

## 2023-10-02 ENCOUNTER — Telehealth: Payer: Self-pay

## 2023-10-02 NOTE — Telephone Encounter (Signed)
-----   Message from Jon Moores sent at 10/01/2023  2:10 PM EST ----- Cholesterol is good.  Sodium is a little elevated-increase water intake to help with this and decrease your salt intake.  Kidney and liver function are normal.  Blood count is good.  Thanks, Jon Moores, PA-C

## 2023-10-02 NOTE — Telephone Encounter (Signed)
 Patient viewed results and Doctor comment through [facility]

## 2023-10-22 ENCOUNTER — Ambulatory Visit: Payer: MEDICAID | Admitting: Urology

## 2023-10-27 ENCOUNTER — Other Ambulatory Visit: Payer: Self-pay

## 2023-11-09 NOTE — Progress Notes (Unsigned)
Name: Jonathan Porter  MRN/ DOB: [id number], [birth year]   Age/ Sex: 33 y.o., male    PCP: Jonathan Matar, MD   Reason for Endocrinology Evaluation: Type 1 Diabetes Mellitus     Date of Initial Endocrinology Visit: 08/12/2022    PATIENT IDENTIFIER: Jonathan Porter is a 33 y.o. male with a past medical history of T1DM. The patient presented for initial endocrinology clinic visit on 08/12/2022 for consultative assistance with his diabetes management.    HPI: Jonathan Porter was    Diagnosed with DM age 79 Hemoglobin A1c has ranged from 7.9% in 2020, peaking at 12.7% in 2022.   Has chronic GI issues  Does physical work and dexcom did not work for him as it kept falling     On his initial visit to our clinic his A1c was 9.0%  SUBJECTIVE:   During the last visit (02/11/2023): A1c 7.9%     Today (11/10/23): Jonathan Porter is here for follow-up on diabetes management.He  checks his blood sugars 3-4 times daily. The patient has  had hypoglycemic episodes since the last clinic visit, which typically occur at random time. The patient is  symptomatic   Has rare occasional nausea but no vomiting  Denies constipation or diarrhea recently but has transient diarrhea a couple of weeks ago    HOME DIABETES REGIMEN: Lantus 22 units daily  Novolog 10 units TIDQAC CF: NovoLog(BG -130/35)   Statin: yes ACE-I/ARB: no    METER DOWNLOAD SUMMARY:     DIABETIC COMPLICATIONS: Microvascular complications:   Denies: CKD, retinopathy, neuropathy  Last eye exam: 09/2022  Macrovascular complications:   Denies: CAD, PVD, CVA   PAST HISTORY: Past Medical History:  Past Medical History:  Diagnosis Date   Anxiety    Depression    Diabetes mellitus without complication (HCC)    Gastroparesis    GERD (gastroesophageal reflux disease)    Past Surgical History:  Past Surgical History:  Procedure Laterality Date   NO PAST SURGERIES     UPPER GASTROINTESTINAL ENDOSCOPY       Social History:  reports that he has been smoking cigarettes. He has a 3 pack-year smoking history. He has never used smokeless tobacco. He reports that he does not currently use alcohol. He reports that he does not currently use drugs after having used the following drugs: Marijuana. Family History:  Family History  Problem Relation Age of Onset   Thyroid disease Mother    Diabetes Maternal Aunt    Diabetes Maternal Grandmother    Diabetes Maternal Grandfather    Thyroid disease Other    Colon cancer Neg Hx    Esophageal cancer Neg Hx    Stomach cancer Neg Hx    Rectal cancer Neg Hx      HOME MEDICATIONS: Allergies as of 11/10/2023       Reactions   Shellfish Allergy Anaphylaxis        Medication List        Accurate as of November 10, 2023  7:59 AM. If you have any questions, ask your nurse or doctor.          Accu-Chek Guide Test test strip Generic drug: glucose blood Use to check blood sugars 4 to 5 times per day.   dicyclomine 20 MG tablet Commonly known as: BENTYL Take 1 tablet (20 mg total) by mouth 2 (two) times daily.   Lantus SoloStar 100 UNIT/ML Solostar Pen Generic drug: insulin glargine Inject 24 Units into the skin  daily.   lisinopril 10 MG tablet Commonly known as: ZESTRIL Take 1 tablet (10 mg total) by mouth daily.   NovoLOG FlexPen 100 UNIT/ML FlexPen Generic drug: insulin aspart Take 10 units 3 times daily with food. Max daily 45 units   omeprazole 40 MG capsule Commonly known as: PRILOSEC Take 1 capsule (40 mg total) by mouth daily with breakfast.   pregabalin 25 MG capsule Commonly known as: Lyrica Take 1 capsule (25 mg total) by mouth 2 (two) times daily.   sildenafil 50 MG tablet Commonly known as: VIAGRA TAKE 1 TABLET BY MOUTH ON AN EMPTY STOMACH 1  HOUR  BEFORE  SEXUAL  ACTIVITY  AS  NEEDED   tiZANidine 4 MG tablet Commonly known as: Zanaflex Take 1 tablet (4 mg total) by mouth 2 (two) times daily as needed.          ALLERGIES: Allergies  Allergen Reactions   Shellfish Allergy Anaphylaxis     REVIEW OF SYSTEMS: A comprehensive ROS was conducted with the patient and is negative except as per HPI   OBJECTIVE:   VITAL SIGNS: BP 136/88 (BP Location: Left Arm, Patient Position: Sitting, Cuff Size: Small)   Pulse 73   Ht 5' 11.75" (1.822 m)   Wt 244 lb (110.7 kg)   SpO2 96%   BMI 33.32 kg/m    PHYSICAL EXAM:  General: Pt appears well and is in NAD  Neck: General: Supple without adenopathy or carotid bruits. Thyroid: Thyroid size normal.  No goiter or nodules appreciated.   Lungs: Clear with good BS bilat with no rales, rhonchi, or wheezes  Heart: RRR   Abdomen:  soft, nontender  Extremities:  Lower extremities - No pretibial edema  Neuro: MS is good with appropriate affect, pt is alert and Ox3    DM foot exam: 11/10/2023   The skin of the feet is without sores or ulcerations. The pedal pulses are 2+ on right and 2+ on left. The sensation is decreased to a screening 5.07, 10 gram monofilament bilaterally at the heels     DATA REVIEWED:  Lab Results  Component Value Date   HGBA1C 7.1 (A) 09/30/2023   HGBA1C 7.9 (A) 02/11/2023   HGBA1C 9.5 (A) 12/03/2022    Latest Reference Range & Units 09/30/23 10:37  Sodium 134 - 144 mmol/L 146 (H)  Potassium 3.5 - 5.2 mmol/L 4.2  Chloride 96 - 106 mmol/L 107 (H)  CO2 20 - 29 mmol/L 26  Glucose 70 - 99 mg/dL 75  BUN 6 - 20 mg/dL 13  Creatinine 1.61 - 0.96 mg/dL 0.45  Calcium 8.7 - 40.9 mg/dL 9.4  BUN/Creatinine Ratio 9 - 20  12  eGFR >59 mL/min/1.73 89  Alkaline Phosphatase 44 - 121 IU/L 116  Albumin 4.1 - 5.1 g/dL 4.1  AST 0 - 40 IU/L 22  ALT 0 - 44 IU/L 24  Total Protein 6.0 - 8.5 g/dL 6.7  Total Bilirubin 0.0 - 1.2 mg/dL 0.2  Total CHOL/HDL Ratio 0.0 - 5.0 ratio 2.9  Cholesterol, Total 100 - 199 mg/dL 811  HDL Cholesterol >91 mg/dL 60  Triglycerides 0 - 478 mg/dL 63  VLDL Cholesterol Cal 5 - 40 mg/dL 12  LDL Chol Calc  (NIH) 0 - 99 mg/dL 295 (H)  (H): Data is abnormally high  ASSESSMENT / PLAN / RECOMMENDATIONS:   1) Type 1 Diabetes Mellitus, Optimally controlled, With Neuropathic complications - Most recent A1c of 7.1 %. Goal A1c < 7.0 %.     -  A1c improving - He tried Dexcom and did not like it , as he keeps knocking it off at work  -In office BG 76 Mg/DL, patient states he does not check his glucose this morning but took random 5 units of NovoLog without eating ???.  We did discuss the importance of taking insulin in a safe way, and the patient should not have taking NovoLog without eating a meal and certainly without checking glucose - NO change   MEDICATIONS: Continue  Lantus 22 units daily  Continue Novolog 10 units TIDQAC Continue  CF: Novolog (BG-130/35)   EDUCATION / INSTRUCTIONS: BG monitoring instructions: Patient is instructed to check his blood sugars 3 times a day, before meals . Call [facility] if: BG persistently < 70  I reviewed the Rule of 15 for the treatment of hypoglycemia in detail with the patient. Literature supplied.   2) Diabetic complications:  Eye: Does not have known diabetic retinopathy.  Neuro/ Feet: Does  have known diabetic peripheral neuropathy. Renal: Patient does not have known baseline CKD    F/U in 6 months    Signed electronically by: Lyndle Herrlich, MD  [facility]  [facility] [address] [place] [place], [place] [address] Phone: [phone] FAX: [phone]   CC: Jonathan Matar, MD [address] [place] [address] Phone: [phone]  Fax: [phone]    Return to Endocrinology clinic as below: No future appointments.

## 2023-11-10 ENCOUNTER — Ambulatory Visit: Payer: MEDICAID | Admitting: Internal Medicine

## 2023-11-10 ENCOUNTER — Other Ambulatory Visit: Payer: Self-pay

## 2023-11-10 ENCOUNTER — Encounter: Payer: Self-pay | Admitting: Internal Medicine

## 2023-11-10 VITALS — BP 136/88 | HR 73 | Ht 71.75 in | Wt 244.0 lb

## 2023-11-10 DIAGNOSIS — E1065 Type 1 diabetes mellitus with hyperglycemia: Secondary | ICD-10-CM

## 2023-11-10 LAB — POCT GLUCOSE (DEVICE FOR HOME USE): POC Glucose: 76 mg/dL (ref 70–99)

## 2023-11-10 MED ORDER — NOVOLOG FLEXPEN 100 UNIT/ML ~~LOC~~ SOPN
45.0000 [IU] | PEN_INJECTOR | Freq: Every day | SUBCUTANEOUS | 3 refills | Status: DC
  Filled 2023-11-10 – 2023-11-16 (×4): qty 39, 86d supply, fill #0
  Filled 2024-02-14: qty 39, 86d supply, fill #1
  Filled 2024-05-07: qty 39, 86d supply, fill #2
  Filled 2024-08-04: qty 39, 86d supply, fill #3

## 2023-11-10 MED ORDER — LANTUS SOLOSTAR 100 UNIT/ML ~~LOC~~ SOPN
22.0000 [IU] | PEN_INJECTOR | Freq: Every day | SUBCUTANEOUS | 3 refills | Status: DC
  Filled 2023-11-10: qty 18, 81d supply, fill #0
  Filled 2023-11-16 (×3): qty 21, 90d supply, fill #0
  Filled 2024-02-14: qty 21, 90d supply, fill #1
  Filled 2024-06-01: qty 21, 90d supply, fill #2

## 2023-11-10 MED ORDER — INSULIN PEN NEEDLE 32G X 4 MM MISC
1.0000 | Freq: Four times a day (QID) | 3 refills | Status: DC
  Filled 2023-11-10 – 2024-04-09 (×2): qty 300, 75d supply, fill #0

## 2023-11-10 NOTE — Patient Instructions (Addendum)
Continue Lantus 22 units daily  Continue Novolog 10 units with EACH meal  Novolog correctional insulin: ADD extra units on insulin to your meal-time Novolog dose if your blood sugars are higher than 165. Use the scale below to help guide you:   Blood sugar before meal Number of units to inject  Less than 165 0 unit  166 -  200 1 units  201 -  235 2 units  236 -  270 3 units  271 -  305 4 units  306 -  340 5 units  341 -  375 6 units  376 -  410 7 units  411 -  445 8 units   HOW TO TREAT LOW BLOOD SUGARS (Blood sugar LESS THAN 70 MG/DL) Please follow the RULE OF 15 for the treatment of hypoglycemia treatment (when your (blood sugars are less than 70 mg/dL)   STEP 1: Take 15 grams of carbohydrates when your blood sugar is low, which includes:  3-4 GLUCOSE TABS  OR 3-4 OZ OF JUICE OR REGULAR SODA OR ONE TUBE OF GLUCOSE GEL    STEP 2: RECHECK blood sugar in 15 MINUTES STEP 3: If your blood sugar is still low at the 15 minute recheck --> then, go back to STEP 1 and treat AGAIN with another 15 grams of carbohydrates.

## 2023-11-16 ENCOUNTER — Other Ambulatory Visit: Payer: Self-pay

## 2023-11-16 ENCOUNTER — Other Ambulatory Visit (HOSPITAL_COMMUNITY): Payer: Self-pay

## 2023-11-19 ENCOUNTER — Other Ambulatory Visit: Payer: Self-pay

## 2023-12-07 ENCOUNTER — Other Ambulatory Visit (HOSPITAL_COMMUNITY): Payer: Self-pay

## 2023-12-08 ENCOUNTER — Other Ambulatory Visit: Payer: Self-pay

## 2023-12-31 ENCOUNTER — Other Ambulatory Visit: Payer: Self-pay

## 2024-01-08 ENCOUNTER — Emergency Department (HOSPITAL_COMMUNITY): Payer: MEDICAID

## 2024-01-08 ENCOUNTER — Emergency Department (HOSPITAL_COMMUNITY)
Admission: EM | Admit: 2024-01-08 | Discharge: 2024-01-08 | Disposition: A | Discharge status: Home / Self Care | Payer: MEDICAID | Attending: Emergency Medicine | Admitting: Emergency Medicine

## 2024-01-08 ENCOUNTER — Other Ambulatory Visit: Payer: Self-pay

## 2024-01-08 ENCOUNTER — Encounter (HOSPITAL_COMMUNITY): Payer: Self-pay

## 2024-01-08 DIAGNOSIS — R202 Paresthesia of skin: Secondary | ICD-10-CM | POA: Insufficient documentation

## 2024-01-08 DIAGNOSIS — M25562 Pain in left knee: Secondary | ICD-10-CM | POA: Insufficient documentation

## 2024-01-08 DIAGNOSIS — M25572 Pain in left ankle and joints of left foot: Secondary | ICD-10-CM | POA: Insufficient documentation

## 2024-01-08 DIAGNOSIS — E119 Type 2 diabetes mellitus without complications: Secondary | ICD-10-CM | POA: Insufficient documentation

## 2024-01-08 LAB — CBC WITH DIFFERENTIAL/PLATELET
Abs Immature Granulocytes: 0.02 10*3/uL (ref 0.00–0.07)
Basophils Absolute: 0.1 10*3/uL (ref 0.0–0.1)
Basophils Relative: 1 %
Eosinophils Absolute: 1 10*3/uL — ABNORMAL HIGH (ref 0.0–0.5)
Eosinophils Relative: 13 %
HCT: 43.9 % (ref 39.0–52.0)
Hemoglobin: 14.5 g/dL (ref 13.0–17.0)
Immature Granulocytes: 0 %
Lymphocytes Relative: 37 %
Lymphs Abs: 2.7 10*3/uL (ref 0.7–4.0)
MCH: 27.4 pg (ref 26.0–34.0)
MCHC: 33 g/dL (ref 30.0–36.0)
MCV: 83 fL (ref 80.0–100.0)
Monocytes Absolute: 0.6 10*3/uL (ref 0.1–1.0)
Monocytes Relative: 8 %
Neutro Abs: 3 10*3/uL (ref 1.7–7.7)
Neutrophils Relative %: 41 %
Platelets: 289 10*3/uL (ref 150–400)
RBC: 5.29 MIL/uL (ref 4.22–5.81)
RDW: 14.4 % (ref 11.5–15.5)
WBC: 7.4 10*3/uL (ref 4.0–10.5)
nRBC: 0 % (ref 0.0–0.2)

## 2024-01-08 LAB — BASIC METABOLIC PANEL WITH GFR
Anion gap: 6 (ref 5–15)
BUN: 12 mg/dL (ref 6–20)
CO2: 25 mmol/L (ref 22–32)
Calcium: 8.4 mg/dL — ABNORMAL LOW (ref 8.9–10.3)
Chloride: 110 mmol/L (ref 98–111)
Creatinine, Ser: 1.29 mg/dL — ABNORMAL HIGH (ref 0.61–1.24)
GFR, Estimated: >60 mL/min (ref >60)
Glucose, Bld: 131 mg/dL — ABNORMAL HIGH (ref 70–99)
Potassium: 4.3 mmol/L (ref 3.5–5.1)
Sodium: 141 mmol/L (ref 135–145)

## 2024-01-08 MED ORDER — IBUPROFEN 800 MG PO TABS: 800.0000 mg | ORAL_TABLET | Freq: Three times a day (TID) | ORAL | 0 refills | Status: AC | PRN

## 2024-01-08 MED ORDER — MORPHINE SULFATE (PF) 4 MG/ML IV SOLN
4.0000 mg | Freq: Once | INTRAVENOUS | Status: AC
  Administered 2024-01-08: 4 mg via INTRAVENOUS
  Filled 2024-01-08: qty 1

## 2024-01-08 MED ORDER — ONDANSETRON HCL 4 MG/2ML IJ SOLN
4.0000 mg | Freq: Once | INTRAMUSCULAR | Status: AC
  Administered 2024-01-08: 4 mg via INTRAVENOUS
  Filled 2024-01-08: qty 2

## 2024-01-08 NOTE — ED Notes (Signed)
 Patient transported to X-ray

## 2024-01-08 NOTE — Progress Notes (Signed)
 Orthopedic Tech Progress Note Patient Details:  Jonathan Porter 29-Sep-1990 [id number]  Ortho Devices Type of Ortho Device: CAM walker, Crutches Ortho Device/Splint Location: LLE Ortho Device/Splint Interventions: Ordered, Application, Adjustment   Post Interventions Patient Tolerated: Fair Instructions Provided: Care of device  [name] 01/08/2024, 10:35 AM

## 2024-01-08 NOTE — Discharge Instructions (Signed)
 Please call to follow up with orthopedics in the next week. You may take Tylenol  and Motrin  as needed for pain. Follow the dosing instructions on the packaging. Keep the ankle elevated above the level of your heart when at rest. You may apply cool compresses for 20 minutes at a time on and off today.

## 2024-01-08 NOTE — ED Provider Notes (Signed)
 Emergency Department Provider Note   I have reviewed the triage vital signs and the nursing notes.   HISTORY  Chief Complaint Ankle Pain   HPI Jonathan Porter is a 33 y.o. male with past history of diabetes presents to the emergency department with left ankle pain.  Patient is not sure how he injured the ankle but did fall out of bed.  He thinks he may have been sleepwalking.  He remembers falling to the ground beside his bed with severe pain in his left ankle.  He crawled back into bed and went to sleep but awoke with ankle swelling and severe pain.  He could not bear weight on the ankle and called EMS. Feels some "tingling" sensation in the left foot. No hip pain. No HA or known head injury.    Past Medical History:  Diagnosis Date   Anxiety    Depression    Diabetes mellitus without complication (HCC)    Gastroparesis    GERD (gastroesophageal reflux disease)     Review of Systems  Constitutional: No fever/chills Cardiovascular: Denies chest pain. Respiratory: Denies shortness of breath. Gastrointestinal: No abdominal pain. Musculoskeletal: Left ankle pain.  Skin: Negative for rash. Neurological: Negative for headaches. Positive tingling in the left foot.   ____________________________________________   PHYSICAL EXAM:  VITAL SIGNS: ED Triage Vitals  Encounter Vitals Group     BP 01/08/24 0903 125/75     Pulse Rate 01/08/24 0903 76     Resp 01/08/24 0903 20     Temp 01/08/24 0903 (!) 97.5 F (36.4 C)     Temp src --      SpO2 01/08/24 0903 100 %     Weight 01/08/24 0858 244 lb 0.8 oz (110.7 kg)     Height 01/08/24 0858 5\' 11"  (1.803 m)   Constitutional: Alert and oriented. Well appearing and in no acute distress. Eyes: Conjunctivae are normal. Head: Atraumatic. Nose: No congestion/rhinnorhea. Mouth/Throat: Mucous membranes are moist.  Neck: No stridor.   Cardiovascular: Normal rate, regular rhythm. Good peripheral circulation. Grossly normal heart  sounds.   Respiratory: Normal respiratory effort.   Gastrointestinal:  No distention.  Musculoskeletal: Swelling to the left ankle diffusely.  2+ DP pulse on the left.  Patient does have tenderness over the proximal fibula. Compartments are soft.  Neurologic:  Normal speech and language. Normal sensation in the left foot.  Skin:  Skin is warm, dry and intact. No rash noted.   ____________________________________________   LABS (all labs ordered are listed, but only abnormal results are displayed)  Labs Reviewed  BASIC METABOLIC PANEL WITH GFR - Abnormal; Notable for the following components:      Result Value   Glucose, Bld 131 (*)    Creatinine, Ser 1.29 (*)    Calcium  8.4 (*)    All other components within normal limits  CBC WITH DIFFERENTIAL/PLATELET - Abnormal; Notable for the following components:   Eosinophils Absolute 1.0 (*)    All other components within normal limits   ____________________________________________   PROCEDURES  Procedure(s) performed:   Procedures  None  ____________________________________________   INITIAL IMPRESSION / ASSESSMENT AND PLAN / ED COURSE  Pertinent labs & imaging results that were available during my care of the patient were reviewed by me and considered in my medical decision making (see chart for details).   This patient is Presenting for Evaluation of ankle pain, which does require a range of treatment options, and is a complaint that involves a high  risk of morbidity and mortality.  The Differential Diagnoses include fracture, dislocation, sprain, compartment syndrome, etc.  Critical Interventions-    Medications  morphine  (PF) 4 MG/ML injection 4 mg (4 mg Intravenous Given 01/08/24 0933)  ondansetron  (ZOFRAN ) injection 4 mg (4 mg Intravenous Given 01/08/24 0932)    Reassessment after intervention: pain improved.   Clinical Laboratory Tests Ordered, included CBC without leukocytosis. No AKI.   Radiologic Tests Ordered,  included ankle/knee XR. I independently interpreted the images and agree with radiology interpretation.   Social Determinants of Health Risk patient is a smoker.   Medical Decision Making: Summary:  Patient presents emergency department with left ankle pain and swelling.  He has intact pulses and sensation in the foot.  Some subjective tingling in the foot according to patient.  I do not see any evidence of open fracture.  Compartments are soft.  He does have some proximal fibular tenderness and so I have added on a knee x-ray as well.  Reevaluation with update and discussion with patient. XR discussed. CAM walker boot provided along with crutches and plan for ortho follow up.   Patient's presentation is most consistent with acute, uncomplicated illness.   Disposition: discharge  ____________________________________________  FINAL CLINICAL IMPRESSION(S) / ED DIAGNOSES  Final diagnoses:  Acute left ankle pain  Acute pain of left knee     NEW OUTPATIENT MEDICATIONS STARTED DURING THIS VISIT:  Discharge Medication List as of 01/08/2024 10:23 AM     START taking these medications   Details  ibuprofen  (ADVIL ) 800 MG tablet Take 1 tablet (800 mg total) by mouth every 8 (eight) hours as needed for moderate pain (pain score 4-6)., Starting Fri 01/08/2024, Normal        Note:  This document was prepared using Dragon voice recognition software and may include unintentional dictation errors.  Abby Hocking, MD, [facility]    Airiel Oblinger, Shereen Dike, MD 01/10/24 [phone]

## 2024-01-08 NOTE — ED Triage Notes (Signed)
 Pt BIB EMS from home after sleep walking and now c/o L ankle pain. Pt unsure how it was injured. VSS with EMS

## 2024-01-12 ENCOUNTER — Encounter (HOSPITAL_BASED_OUTPATIENT_CLINIC_OR_DEPARTMENT_OTHER): Payer: Self-pay | Admitting: Student

## 2024-01-12 ENCOUNTER — Ambulatory Visit (HOSPITAL_BASED_OUTPATIENT_CLINIC_OR_DEPARTMENT_OTHER): Payer: MEDICAID | Admitting: Student

## 2024-01-12 DIAGNOSIS — S92252A Displaced fracture of navicular [scaphoid] of left foot, initial encounter for closed fracture: Secondary | ICD-10-CM | POA: Diagnosis not present

## 2024-01-12 DIAGNOSIS — S93402A Sprain of unspecified ligament of left ankle, initial encounter: Secondary | ICD-10-CM | POA: Diagnosis not present

## 2024-01-12 DIAGNOSIS — M25562 Pain in left knee: Secondary | ICD-10-CM | POA: Diagnosis not present

## 2024-01-12 NOTE — Progress Notes (Signed)
 Chief Complaint: Left ankle and knee pain    Discussed the use of AI scribe software for clinical note transcription with the patient, who gave verbal consent to proceed.  History of Present Illness Jonathan Porter is a 33 year old male who presents with a left ankle and knee injury that occurred during sleep. The patient reports a history of sleepwalking, although no recent episodes were noted. The patient woke up from sleep due to a fall from the bed, with the leg twisted behind and foot at an unusual angle. The patient experienced severe pain and was unable to stand the next morning. The pain and swelling are primarily located in the front of the ankle. The patient also reports pain on the outside of the knee, particularly when getting into a car or lifting the leg. The patient has been using a boot and crutches for mobility.  He works as a Estate agent at [employer].   Surgical History:   None  PMH/PSH/Family History/Social History/Meds/Allergies:    Past Medical History:  Diagnosis Date   Anxiety    Depression    Diabetes mellitus without complication (HCC)    Gastroparesis    GERD (gastroesophageal reflux disease)    Past Surgical History:  Procedure Laterality Date   NO PAST SURGERIES     UPPER GASTROINTESTINAL ENDOSCOPY     Social History   Socioeconomic History   Marital status: Single    Spouse name: Not on file   Number of children: Not on file   Years of education: Not on file   Highest education level: GED or equivalent  Occupational History   Not on file  Tobacco Use   Smoking status: Every Day    Current packs/day: 0.50    Average packs/day: 0.5 packs/day for 6.0 years (3.0 ttl pk-yrs)    Types: Cigarettes   Smokeless tobacco: Never  Vaping Use   Vaping status: Never Used  Substance and Sexual Activity   Alcohol use: Not Currently    Comment: social    Drug use: Not Currently    Types: Marijuana   Sexual  activity: Yes    Birth control/protection: Condom    Comment: stated used condom 80% of the time   Other Topics Concern   Not on file  Social History Narrative   Not on file   Social Drivers of Health   Financial Resource Strain: Low Risk  (09/30/2023)   Overall Financial Resource Strain (CARDIA)    Difficulty of Paying Living Expenses: Not very hard  Food Insecurity: Food Insecurity Present (09/30/2023)   Hunger Vital Sign    Worried About Running Out of Food in the Last Year: Sometimes true    Ran Out of Food in the Last Year: Sometimes true  Transportation Needs: No Transportation Needs (09/30/2023)   PRAPARE - Administrator, Civil Service (Medical): No    Lack of Transportation (Non-Medical): No  Physical Activity: Not on file  Stress: Not on file  Social Connections: Unknown (09/30/2023)   Social Connection and Isolation Panel [NHANES]    Frequency of Communication with Friends and Family: Three times a week    Frequency of Social Gatherings with Friends and Family: Once a week    Attends Religious Services: Patient declined    Database administrator or Organizations:  No    Attends Banker Meetings: Not on file    Marital Status: Never married   Family History  Problem Relation Age of Onset   Thyroid disease Mother    Diabetes Maternal Aunt    Diabetes Maternal Grandmother    Diabetes Maternal Grandfather    Thyroid disease Other    Colon cancer Neg Hx    Esophageal cancer Neg Hx    Stomach cancer Neg Hx    Rectal cancer Neg Hx    Allergies  Allergen Reactions   Shellfish Allergy Anaphylaxis   Current Outpatient Medications  Medication Sig Dispense Refill   dicyclomine  (BENTYL ) 20 MG tablet Take 1 tablet (20 mg total) by mouth 2 (two) times daily. 20 tablet 0   glucose blood test strip Use to check blood sugars 4 to 5 times per day. 100 strip 11   ibuprofen  (ADVIL ) 800 MG tablet Take 1 tablet (800 mg total) by mouth every 8 (eight) hours as  needed for moderate pain (pain score 4-6). 21 tablet 0   insulin  aspart (NOVOLOG  FLEXPEN) 100 UNIT/ML FlexPen Take 10 units 3 times daily with food. Max daily 45 units 45 mL 3   insulin  glargine (LANTUS  SOLOSTAR) 100 UNIT/ML Solostar Pen Inject 22 Units into the skin daily. 30 mL 3   Insulin  Pen Needle 32G X 4 MM MISC use as directed with insulin  morning, at noon, in the evening, and at bedtime. 400 each 3   lisinopril  (ZESTRIL ) 10 MG tablet Take 1 tablet (10 mg total) by mouth daily. 90 tablet 3   omeprazole  (PRILOSEC) 40 MG capsule Take 1 capsule (40 mg total) by mouth daily with breakfast. (Patient not taking: Reported on 09/07/2023) 90 capsule 1   pregabalin  (LYRICA ) 25 MG capsule Take 1 capsule (25 mg total) by mouth 2 (two) times daily. 60 capsule 3   sildenafil  (VIAGRA ) 50 MG tablet TAKE 1 TABLET BY MOUTH ON AN EMPTY STOMACH 1  HOUR  BEFORE  SEXUAL  ACTIVITY  AS  NEEDED 10 tablet 4   tiZANidine  (ZANAFLEX ) 4 MG tablet Take 1 tablet (4 mg total) by mouth 2 (two) times daily as needed. 60 tablet 1   Current Facility-Administered Medications  Medication Dose Route Frequency Provider Last Rate Last Admin   0.9 %  sodium chloride  infusion  500 mL Intravenous Once Mansouraty, Gabriel Jr., MD       No results found.  Review of Systems:   A ROS was performed including pertinent positives and negatives as documented in the HPI.  Physical Exam :   Constitutional: NAD and appears stated age Neurological: Alert and oriented Psych: Appropriate affect and cooperative There were no vitals taken for this visit.   Comprehensive Musculoskeletal Exam:    Exam of the left foot and ankle demonstrates tenderness over the dorsal midfoot, anterior ankle, and ATFL.  No medial or lateral malleolus tenderness.  Active ankle range of motion to 15 degrees dorsiflexion plantarflexion.  Positive anterior drawer with negative Thompson test.  Dorsalis pedis 2+.  Distal neurosensory exam intact. Mild tenderness in  the left knee over the lateral joint line.  Active range of motion from 0 to 110 degrees.  No significant effusion, erythema, or warmth.  Stable collaterals with varus valgus stress.  Imaging:   Xray reviewed from 01/08/2024 (left ankle 3 views, left knee 4 views): Small avulsion fracture noted off the dorsal aspect of the navicular.  Otherwise ankle and knee are negative for any bony abnormality.  I personally reviewed and interpreted the radiographs.      Assessment & Plan Avulsion fracture of ankle   A small avulsion fracture is noted off the dorsal navicular which resulted from a twisting incident.  He does have some pain over the lateral ankle ligaments and swelling consistent with a sprain. X-rays confirm no other major bony injury. Recommend wearing a boot for 2-3 weeks to prevent ankle motion and facilitate healing, followed by transitioning to a brace. Advise against prolonged standing and heavy lifting. Provide a work restrictions note for light duty. Instruct to wear the boot immediately.  Knee pain   Lateral knee pain is likely due to a twisting injury versus contusion. X-rays show no significant bony injury.  This has been improving over the past few days so we will continue to monitor.     I personally saw and evaluated the patient, and participated in the management and treatment plan.  Sharrell Deck, PA-C Orthopedics

## 2024-01-23 ENCOUNTER — Other Ambulatory Visit (HOSPITAL_COMMUNITY): Payer: Self-pay

## 2024-01-25 ENCOUNTER — Encounter (HOSPITAL_BASED_OUTPATIENT_CLINIC_OR_DEPARTMENT_OTHER): Payer: Self-pay

## 2024-02-01 ENCOUNTER — Encounter (HOSPITAL_BASED_OUTPATIENT_CLINIC_OR_DEPARTMENT_OTHER): Payer: Self-pay | Admitting: Student

## 2024-02-01 ENCOUNTER — Ambulatory Visit (HOSPITAL_BASED_OUTPATIENT_CLINIC_OR_DEPARTMENT_OTHER): Payer: MEDICAID | Admitting: Student

## 2024-02-01 DIAGNOSIS — S93402A Sprain of unspecified ligament of left ankle, initial encounter: Secondary | ICD-10-CM

## 2024-02-01 DIAGNOSIS — S92252A Displaced fracture of navicular [scaphoid] of left foot, initial encounter for closed fracture: Secondary | ICD-10-CM | POA: Diagnosis not present

## 2024-02-01 NOTE — Progress Notes (Addendum)
 Chief Complaint: Left ankle pain    History of Present Illness  02/01/24: Patient presents today for follow-up of his left foot and ankle.  He sustained an injury to this area approximately 1 month ago resulting in a small navicular avulsion and lateral ankle sprain.  At today's visit he reports significant improvement in his symptoms, and states that he is no longer having any pain or issues with that left ankle, or the left knee which was also evaluated at that time.  He has been still been out of work, but feels ready to return.   01/12/24: Jonathan Porter is a 33 year old male who presents with a left ankle and knee injury that occurred during sleep. The patient reports a history of sleepwalking, although no recent episodes were noted. The patient woke up from sleep due to a fall from the bed, with the leg twisted behind and foot at an unusual angle. The patient experienced severe pain and was unable to stand the next morning. The pain and swelling are primarily located in the front of the ankle. The patient also reports pain on the outside of the knee, particularly when getting into a car or lifting the leg. The patient has been using a boot and crutches for mobility.  He works as a Estate agent at [employer].   Surgical History:   None  PMH/PSH/Family History/Social History/Meds/Allergies:    Past Medical History:  Diagnosis Date   Anxiety    Depression    Diabetes mellitus without complication (HCC)    Gastroparesis    GERD (gastroesophageal reflux disease)    Past Surgical History:  Procedure Laterality Date   NO PAST SURGERIES     UPPER GASTROINTESTINAL ENDOSCOPY     Social History   Socioeconomic History   Marital status: Single    Spouse name: Not on file   Number of children: Not on file   Years of education: Not on file   Highest education level: GED or equivalent  Occupational History   Not on file  Tobacco Use   Smoking  status: Every Day    Current packs/day: 0.50    Average packs/day: 0.5 packs/day for 6.0 years (3.0 ttl pk-yrs)    Types: Cigarettes   Smokeless tobacco: Never  Vaping Use   Vaping status: Never Used  Substance and Sexual Activity   Alcohol use: Not Currently    Comment: social    Drug use: Not Currently    Types: Marijuana   Sexual activity: Yes    Birth control/protection: Condom    Comment: stated used condom 80% of the time   Other Topics Concern   Not on file  Social History Narrative   Not on file   Social Drivers of Health   Financial Resource Strain: Low Risk  (09/30/2023)   Overall Financial Resource Strain (CARDIA)    Difficulty of Paying Living Expenses: Not very hard  Food Insecurity: Food Insecurity Present (09/30/2023)   Hunger Vital Sign    Worried About Running Out of Food in the Last Year: Sometimes true    Ran Out of Food in the Last Year: Sometimes true  Transportation Needs: No Transportation Needs (09/30/2023)   PRAPARE - Administrator, Civil Service (Medical): No    Lack of Transportation (Non-Medical): No  Physical  Activity: Not on file  Stress: Not on file  Social Connections: Unknown (09/30/2023)   Social Connection and Isolation Panel [NHANES]    Frequency of Communication with Friends and Family: Three times a week    Frequency of Social Gatherings with Friends and Family: Once a week    Attends Religious Services: Patient declined    Database administrator or Organizations: No    Attends Engineer, structural: Not on file    Marital Status: Never married   Family History  Problem Relation Age of Onset   Thyroid disease Mother    Diabetes Maternal Aunt    Diabetes Maternal Grandmother    Diabetes Maternal Grandfather    Thyroid disease Other    Colon cancer Neg Hx    Esophageal cancer Neg Hx    Stomach cancer Neg Hx    Rectal cancer Neg Hx    Allergies  Allergen Reactions   Shellfish Allergy Anaphylaxis   Current  Outpatient Medications  Medication Sig Dispense Refill   dicyclomine  (BENTYL ) 20 MG tablet Take 1 tablet (20 mg total) by mouth 2 (two) times daily. 20 tablet 0   glucose blood test strip Use to check blood sugars 4 to 5 times per day. 100 strip 11   ibuprofen  (ADVIL ) 800 MG tablet Take 1 tablet (800 mg total) by mouth every 8 (eight) hours as needed for moderate pain (pain score 4-6). 21 tablet 0   insulin  aspart (NOVOLOG  FLEXPEN) 100 UNIT/ML FlexPen Take 10 units 3 times daily with food. Max daily 45 units 45 mL 3   insulin  glargine (LANTUS  SOLOSTAR) 100 UNIT/ML Solostar Pen Inject 22 Units into the skin daily. 30 mL 3   Insulin  Pen Needle 32G X 4 MM MISC use as directed with insulin  morning, at noon, in the evening, and at bedtime. 400 each 3   lisinopril  (ZESTRIL ) 10 MG tablet Take 1 tablet (10 mg total) by mouth daily. 90 tablet 3   omeprazole  (PRILOSEC) 40 MG capsule Take 1 capsule (40 mg total) by mouth daily with breakfast. (Patient not taking: Reported on 09/07/2023) 90 capsule 1   pregabalin  (LYRICA ) 25 MG capsule Take 1 capsule (25 mg total) by mouth 2 (two) times daily. 60 capsule 3   sildenafil  (VIAGRA ) 50 MG tablet TAKE 1 TABLET BY MOUTH ON AN EMPTY STOMACH 1  HOUR  BEFORE  SEXUAL  ACTIVITY  AS  NEEDED 10 tablet 4   tiZANidine  (ZANAFLEX ) 4 MG tablet Take 1 tablet (4 mg total) by mouth 2 (two) times daily as needed. 60 tablet 1   Current Facility-Administered Medications  Medication Dose Route Frequency Provider Last Rate Last Admin   0.9 %  sodium chloride  infusion  500 mL Intravenous Once Mansouraty, Gabriel Jr., MD       No results found.  Review of Systems:   A ROS was performed including pertinent positives and negatives as documented in the HPI.  Physical Exam :   Constitutional: NAD and appears stated age Neurological: Alert and oriented Psych: Appropriate affect and cooperative There were no vitals taken for this visit.   Comprehensive Musculoskeletal Exam:    No  significant tenderness with palpation throughout the left foot or ankle.  Full active ankle ROM to 20 degrees dorsiflexion 40 degrees plantarflexion.  No significant swelling is noted.  Negative anterior drawer.  Dorsalis pedis pulse 2+.  Imaging:       Assessment & Plan Acute left ankle pain Patient is 4 weeks status  post left ankle injury, resulting in a small dorsal navicular avulsion and symptoms consistent with a lateral ankle sprain.  Overall his symptoms have essentially resolved, and he is back to full weightbearing and activity without use of a boot or brace.  Given this, I have cleared him for return to full duty at work with no restrictions.  Will plan to have him follow-up as needed.     I personally saw and evaluated the patient, and participated in the management and treatment plan.  Sharrell Deck, PA-C Orthopedics

## 2024-02-14 ENCOUNTER — Other Ambulatory Visit: Payer: Self-pay

## 2024-02-16 ENCOUNTER — Other Ambulatory Visit: Payer: Self-pay

## 2024-03-10 ENCOUNTER — Other Ambulatory Visit: Payer: Self-pay

## 2024-03-16 ENCOUNTER — Encounter: Payer: Self-pay | Admitting: Internal Medicine

## 2024-03-17 ENCOUNTER — Encounter: Payer: Self-pay | Admitting: Physician Assistant

## 2024-03-17 ENCOUNTER — Other Ambulatory Visit (HOSPITAL_COMMUNITY): Payer: Self-pay

## 2024-03-17 ENCOUNTER — Other Ambulatory Visit: Payer: Self-pay

## 2024-03-17 ENCOUNTER — Telehealth: Payer: MEDICAID | Admitting: Physician Assistant

## 2024-03-17 DIAGNOSIS — R21 Rash and other nonspecific skin eruption: Secondary | ICD-10-CM

## 2024-03-17 DIAGNOSIS — L299 Pruritus, unspecified: Secondary | ICD-10-CM

## 2024-03-17 MED ORDER — HYDROXYZINE HCL 10 MG PO TABS
10.0000 mg | ORAL_TABLET | Freq: Three times a day (TID) | ORAL | 0 refills | Status: DC | PRN
  Filled 2024-03-17 (×2): qty 30, 10d supply, fill #0

## 2024-03-17 MED ORDER — TRIAMCINOLONE ACETONIDE 0.1 % EX CREA
1.0000 | TOPICAL_CREAM | Freq: Two times a day (BID) | CUTANEOUS | 0 refills | Status: DC
Start: 2024-03-17 — End: 2024-07-11
  Filled 2024-03-17: qty 80, 30d supply, fill #0
  Filled 2024-03-17: qty 80, 40d supply, fill #0

## 2024-03-17 NOTE — Progress Notes (Signed)
 Virtual Visit Consent   Jonathan Porter, you are scheduled for a virtual visit with a [facility] provider today. Just as with appointments in the office, your consent must be obtained to participate. Your consent will be active for this visit and any virtual visit you may have with one of our providers in the next 365 days. If you have a MyChart account, a copy of this consent can be sent to you electronically.  As this is a virtual visit, video technology does not allow for your provider to perform a traditional examination. This may limit your provider's ability to fully assess your condition. If your provider identifies any concerns that need to be evaluated in person or the need to arrange testing (such as labs, EKG, etc.), we will make arrangements to do so. Although advances in technology are sophisticated, we cannot ensure that it will always work on either your end or our end. If the connection with a video visit is poor, the visit may have to be switched to a telephone visit. With either a video or telephone visit, we are not always able to ensure that we have a secure connection.  By engaging in this virtual visit, you consent to the provision of healthcare and authorize for your insurance to be billed (if applicable) for the services provided during this visit. Depending on your insurance coverage, you may receive a charge related to this service.  I need to obtain your verbal consent now. Are you willing to proceed with your visit today? Jonathan Porter has provided verbal consent on 03/17/2024 for a virtual visit (video or telephone). Kirk GORMAN Sage, PA-C  Date: 03/17/2024 11:45 AM   Virtual Visit via Video Note   I, Jonathan Porter, connected with  Jonathan Porter  ([id number], [birth year]) on 03/17/24 at 11:40 AM EDT by a video-enabled telemedicine application and verified that I am speaking with the correct person using two identifiers.  Location: Patient: Virtual Visit Location Patient:  Mobile Provider: Virtual Visit Location Provider: [facility]    I discussed the limitations of evaluation and management by telemedicine and the availability of in person appointments. The patient expressed understanding and agreed to proceed.    History of Present Illness: Jonathan Porter is a 33 y.o. who identifies as a male who was assigned male at birth,  presents with a heat rash   He experiences a recurrent heat rash with small, fine bumps on his back, arms, chest, and stomach, exacerbated by his hot and dusty work environment. His skin is sensitive to the sun, and excessive sweating worsens the condition, which occurs annually during summer.  He has used over-the-counter hydrocortisone creams without relief and recalls using a  prescription cream that was effective in the past. He has not used Benadryl  or other antihistamines for itching. He manages symptoms with cold showers and air conditioning for temporary relief.  He works long hours in a [employer] and prefers prescriptions mailed to his home. There is no drainage from the lesions, and he has not used any new lotions, detergents, or fragrances.  Problems:  Patient Active Problem List   Diagnosis Date Noted   MDD (major depressive disorder), recurrent episode, moderate (HCC) 09/16/2021   Major depressive disorder, single episode, moderate (HCC) 01/01/2021   Abdominal pain, recurrent 01/01/2021   Marijuana abuse 12/17/2020   Intractable nausea and vomiting 07/03/2019   Gastroparesis 07/02/2019   Influenza vaccine refused 06/03/2019   Erectile dysfunction  06/03/2019   Obesity (BMI 30-39.9) 06/03/2019   Type 1 diabetes mellitus with diabetic polyneuropathy (HCC) 01/21/2019   Type 1 diabetes mellitus with hyperglycemia (HCC) 01/21/2019   Paronychia of finger of left hand 01/20/2017   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) 04/18/2016   Diabetes type 1, uncontrolled 08/23/2015   Onychomycosis  of toenail 08/23/2015   Phimosis 08/07/2014    Allergies:  Allergies  Allergen Reactions   Shellfish Allergy Anaphylaxis   Medications:  Current Outpatient Medications:    hydrOXYzine (ATARAX) 10 MG tablet, Take 1 tablet (10 mg total) by mouth 3 (three) times daily as needed., Disp: 30 tablet, Rfl: 0   triamcinolone  cream (KENALOG ) 0.1 %, Apply 1 Application topically 2 (two) times daily., Disp: 80 g, Rfl: 0   dicyclomine  (BENTYL ) 20 MG tablet, Take 1 tablet (20 mg total) by mouth 2 (two) times daily., Disp: 20 tablet, Rfl: 0   glucose blood test strip, Use to check blood sugars 4 to 5 times per day., Disp: 100 strip, Rfl: 11   ibuprofen  (ADVIL ) 800 MG tablet, Take 1 tablet (800 mg total) by mouth every 8 (eight) hours as needed for moderate pain (pain score 4-6)., Disp: 21 tablet, Rfl: 0   insulin  aspart (NOVOLOG  FLEXPEN) 100 UNIT/ML FlexPen, Take 10 units 3 times daily with food. Max daily 45 units, Disp: 45 mL, Rfl: 3   insulin  glargine (LANTUS  SOLOSTAR) 100 UNIT/ML Solostar Pen, Inject 22 Units into the skin daily., Disp: 30 mL, Rfl: 3   Insulin  Pen Needle 32G X 4 MM MISC, use as directed with insulin  morning, at noon, in the evening, and at bedtime., Disp: 400 each, Rfl: 3   lisinopril  (ZESTRIL ) 10 MG tablet, Take 1 tablet (10 mg total) by mouth daily., Disp: 90 tablet, Rfl: 3   omeprazole  (PRILOSEC) 40 MG capsule, Take 1 capsule (40 mg total) by mouth daily with breakfast. (Patient not taking: Reported on 09/07/2023), Disp: 90 capsule, Rfl: 1   pregabalin  (LYRICA ) 25 MG capsule, Take 1 capsule (25 mg total) by mouth 2 (two) times daily., Disp: 60 capsule, Rfl: 3   sildenafil  (VIAGRA ) 50 MG tablet, TAKE 1 TABLET BY MOUTH ON AN EMPTY STOMACH 1  HOUR  BEFORE  SEXUAL  ACTIVITY  AS  NEEDED, Disp: 10 tablet, Rfl: 4   tiZANidine  (ZANAFLEX ) 4 MG tablet, Take 1 tablet (4 mg total) by mouth 2 (two) times daily as needed., Disp: 60 tablet, Rfl: 1  Current Facility-Administered Medications:    0.9  %  sodium chloride  infusion, 500 mL, Intravenous, Once, Mansouraty, Aloha Raddle., MD  Observations/Objective: Patient is well-developed, well-nourished in no acute distress.  Resting comfortably   Head is normocephalic, atraumatic.  No labored breathing.  Speech is clear and coherent with logical content.  Patient is alert and oriented at baseline.    Assessment and Plan: 1. Rash and nonspecific skin eruption (Primary) - triamcinolone  cream (KENALOG ) 0.1 %; Apply 1 Application topically 2 (two) times daily.  Dispense: 80 g; Refill: 0  2. Pruritus - hydrOXYzine (ATARAX) 10 MG tablet; Take 1 tablet (10 mg total) by mouth 3 (three) times daily as needed.  Dispense: 30 tablet; Refill: 0  Trial hydroxyzine, Kenalog .  Patient education given on supportive care, red flags given for prompt reevaluation.  Follow Up Instructions: I discussed the assessment and treatment plan with the patient. The patient was provided an opportunity to ask questions and all were answered. The patient agreed with the plan and demonstrated an understanding  of the instructions.  A copy of instructions were sent to the patient via MyChart unless otherwise noted below.    The patient was advised to call back or seek an in-person evaluation if the symptoms worsen or if the condition fails to improve as anticipated.    Kirk RAMAN Mayers, PA-C

## 2024-03-17 NOTE — Patient Instructions (Signed)
 Jonathan Porter, thank you for joining Kirk GORMAN Sage, PA-C for today's virtual visit.  While this provider is not your primary care provider (PCP), if your PCP is located in our provider database this encounter information will be shared with them immediately following your visit.   A [facility] MyChart account gives you access to today's visit and all your visits, tests, and labs performed at [facility]  click here if you don't have a [facility] MyChart account or go to [email or web address]  Consent: (Patient) Jonathan Porter provided verbal consent for this virtual visit at the beginning of the encounter.  Current Medications:  Current Outpatient Medications:    hydrOXYzine (ATARAX) 10 MG tablet, Take 1 tablet (10 mg total) by mouth 3 (three) times daily as needed., Disp: 30 tablet, Rfl: 0   triamcinolone  cream (KENALOG ) 0.1 %, Apply 1 Application topically 2 (two) times daily., Disp: 80 g, Rfl: 0   dicyclomine  (BENTYL ) 20 MG tablet, Take 1 tablet (20 mg total) by mouth 2 (two) times daily., Disp: 20 tablet, Rfl: 0   glucose blood test strip, Use to check blood sugars 4 to 5 times per day., Disp: 100 strip, Rfl: 11   ibuprofen  (ADVIL ) 800 MG tablet, Take 1 tablet (800 mg total) by mouth every 8 (eight) hours as needed for moderate pain (pain score 4-6)., Disp: 21 tablet, Rfl: 0   insulin  aspart (NOVOLOG  FLEXPEN) 100 UNIT/ML FlexPen, Take 10 units 3 times daily with food. Max daily 45 units, Disp: 45 mL, Rfl: 3   insulin  glargine (LANTUS  SOLOSTAR) 100 UNIT/ML Solostar Pen, Inject 22 Units into the skin daily., Disp: 30 mL, Rfl: 3   Insulin  Pen Needle 32G X 4 MM MISC, use as directed with insulin  morning, at noon, in the evening, and at bedtime., Disp: 400 each, Rfl: 3   lisinopril  (ZESTRIL ) 10 MG tablet, Take 1 tablet (10 mg total) by mouth daily., Disp: 90 tablet, Rfl: 3   omeprazole  (PRILOSEC) 40 MG capsule, Take 1 capsule (40 mg total) by mouth daily with breakfast.  (Patient not taking: Reported on 09/07/2023), Disp: 90 capsule, Rfl: 1   pregabalin  (LYRICA ) 25 MG capsule, Take 1 capsule (25 mg total) by mouth 2 (two) times daily., Disp: 60 capsule, Rfl: 3   sildenafil  (VIAGRA ) 50 MG tablet, TAKE 1 TABLET BY MOUTH ON AN EMPTY STOMACH 1  HOUR  BEFORE  SEXUAL  ACTIVITY  AS  NEEDED, Disp: 10 tablet, Rfl: 4   tiZANidine  (ZANAFLEX ) 4 MG tablet, Take 1 tablet (4 mg total) by mouth 2 (two) times daily as needed., Disp: 60 tablet, Rfl: 1  Current Facility-Administered Medications:    0.9 %  sodium chloride  infusion, 500 mL, Intravenous, Once, Mansouraty, Gabriel Jr., MD   Medications ordered in this encounter:  Meds ordered this encounter  Medications   triamcinolone  cream (KENALOG ) 0.1 %    Sig: Apply 1 Application topically 2 (two) times daily.    Dispense:  80 g    Refill:  0    Please mail to his home address    Supervising Provider:   JEGEDE, OLUGBEMIGA E [[id number]]   hydrOXYzine (ATARAX) 10 MG tablet    Sig: Take 1 tablet (10 mg total) by mouth 3 (three) times daily as needed.    Dispense:  30 tablet    Refill:  0    Please mail to his home address    Supervising Provider:   JEGEDE, OLUGBEMIGA E [[id number]]     *If  you need refills on other medications prior to your next appointment, please contact your pharmacy*  Follow-Up: Call back or seek an in-person evaluation if the symptoms worsen or if the condition fails to improve as anticipated.  Other Instructions Rash, Adult A rash is a breakout of spots or blotches on the skin. It can affect the way the skin looks and feels. Many things can cause a rash. Common causes include: Viral infections. These include colds, measles, and hand, foot, and mouth disease. Bacterial infections. These include scarlet fever and impetigo. Fungal infections. These include athlete's foot, ringworm, and yeast rashes. Skin irritation. This may be from heat rash, exposure to moisture or friction for a long time  (intertrigo), or exposure to soap or skin care products (eczema). Allergic reactions. These may be caused by foods, medicines, or things like poison ivy. Some rashes may go away after a few days. Others may last for a few weeks. The goal of treatment is to stop the itching and keep the rash from spreading. Follow these instructions at home: Medicine Take or apply over-the-counter and prescription medicines only as told by your health care provider. These may include: Corticosteroids. These can help treat red or swollen skin. They may be given as creams or as medicines to take by mouth (orally). Anti-itch lotions. Allergy medicines. Pain medicine. Antifungal medicine if the rash is from a fungal infection. Antibiotics if you have an infection.  Skin care Apply cool, wet cloths (compresses) to the affected areas. Do not scratch or rub your skin. Avoid covering the rash. Keep it exposed to air as often as you can. Managing itching and discomfort Avoid hot showers and baths. These can make itching worse. A cold shower may help. Try taking a bath with: Epsom salts. You can get these at your local pharmacy or grocery store. Follow the instructions on the package. Baking soda. Pour a small amount into the bath as told by your provider. Colloidal oatmeal. You can get this at your local pharmacy or grocery store. Follow the instructions on the package. Try putting baking soda paste on your skin. Stir water into baking soda until it becomes like a paste. Try using calamine lotion or cortisone cream to help with itchiness. Keep cool. Stay out of the sun. Sweating and being hot can make itching worse. General instructions  Rest as needed. Drink enough fluid to keep your pee (urine) pale yellow. Wear loose-fitting clothes. Avoid scented soaps, detergents, and perfumes. Use gentle soaps, detergents, perfumes, and cosmetics. Avoid the things that cause your rash (triggers). Keep a journal to help  keep track of your triggers. Write down: What you eat. What cosmetics you use. What you drink. What you wear. This includes jewelry. Contact a health care provider if: You sweat at night more than normal. You pee (urinate) more or less than normal, or your pee is a darker color than normal. Your eyes become sensitive to light. Your skin or the white parts of your eyes turn yellow (jaundice). Your skin tingles or is numb. You get painful blisters in your nose or mouth. Your rash does not go away after a few days, or it gets worse. You are more tired or thirsty than normal. You have new or worse symptoms. These may include: Pain in your abdomen. Fever. Diarrhea or vomiting. Weakness or weight loss. Get help right away if: You get confused. You have a severe headache, a stiff neck, or severe joint pain or stiffness. You become very sleepy  or not responsive. You have a seizure. This information is not intended to replace advice given to you by your health care provider. Make sure you discuss any questions you have with your health care provider. Document Revised: 06/27/2022 Document Reviewed: 06/27/2022 Elsevier Patient Education  2024 Elsevier Inc.   If you have been instructed to have an in-person evaluation today at a local Urgent Care facility, please use the link below. It will take you to a list of all of our available [facility], including address, phone number and hours of operation. Please do not delay care.  [facility]  Learn more about [facility]'s in-office and virtual care options: [facility] - Get Care Now

## 2024-04-09 ENCOUNTER — Other Ambulatory Visit (HOSPITAL_COMMUNITY): Payer: Self-pay

## 2024-04-11 ENCOUNTER — Other Ambulatory Visit: Payer: Self-pay

## 2024-05-05 ENCOUNTER — Other Ambulatory Visit (HOSPITAL_COMMUNITY): Payer: Self-pay

## 2024-05-07 ENCOUNTER — Other Ambulatory Visit (HOSPITAL_COMMUNITY): Payer: Self-pay

## 2024-05-17 ENCOUNTER — Telehealth: Payer: Self-pay

## 2024-05-17 ENCOUNTER — Other Ambulatory Visit (HOSPITAL_COMMUNITY): Payer: Self-pay

## 2024-05-17 ENCOUNTER — Encounter: Payer: Self-pay | Admitting: Internal Medicine

## 2024-05-17 ENCOUNTER — Other Ambulatory Visit: Payer: Self-pay

## 2024-05-17 ENCOUNTER — Ambulatory Visit (INDEPENDENT_AMBULATORY_CARE_PROVIDER_SITE_OTHER): Payer: MEDICAID | Admitting: Internal Medicine

## 2024-05-17 VITALS — BP 120/88 | HR 66 | Ht 71.0 in | Wt 246.0 lb

## 2024-05-17 DIAGNOSIS — E1042 Type 1 diabetes mellitus with diabetic polyneuropathy: Secondary | ICD-10-CM | POA: Diagnosis not present

## 2024-05-17 DIAGNOSIS — K219 Gastro-esophageal reflux disease without esophagitis: Secondary | ICD-10-CM | POA: Diagnosis not present

## 2024-05-17 LAB — POCT GLUCOSE (DEVICE FOR HOME USE): Glucose Fasting, POC: 91 mg/dL (ref 70–99)

## 2024-05-17 LAB — POCT GLYCOSYLATED HEMOGLOBIN (HGB A1C): Hemoglobin A1C: 6.3 % — AB (ref 4.0–5.6)

## 2024-05-17 MED ORDER — OMEPRAZOLE 20 MG PO CPDR
20.0000 mg | DELAYED_RELEASE_CAPSULE | Freq: Every day | ORAL | 1 refills | Status: AC
Start: 2024-05-17 — End: ?
  Filled 2024-05-17 (×2): qty 90, 90d supply, fill #0

## 2024-05-17 MED ORDER — FREESTYLE LIBRE 3 PLUS SENSOR MISC
1.0000 | 3 refills | Status: DC
  Filled 2024-05-17: qty 2, 30d supply, fill #0
  Filled 2024-05-17: qty 6, fill #0
  Filled 2024-05-17: qty 6, 90d supply, fill #0
  Filled 2024-06-01 – 2024-06-20 (×2): qty 2, 30d supply, fill #1
  Filled 2024-07-11 – 2024-07-13 (×2): qty 2, 30d supply, fill #2
  Filled 2024-08-11: qty 2, 30d supply, fill #3
  Filled 2024-08-28: qty 2, 30d supply, fill #4

## 2024-05-17 NOTE — Telephone Encounter (Signed)
 Pharmacy Patient Advocate Encounter   Received notification from CoverMyMeds that prior authorization for Freestyle libre 3 plus sensor is required/requested.   Insurance verification completed.   The patient is insured through [employer] MEDICA[employer] .   Per test claim: PA required; PA submitted to above mentioned insurance via Latent Key/confirmation #/EOC [id number] Status is pending

## 2024-05-17 NOTE — Progress Notes (Signed)
 Name: Jonathan Porter  MRN/ DOB: [id number], 04/16/1991   Age/ Sex: 33 y.o., male    PCP: Vicci Barnie NOVAK, MD   Reason for Endocrinology Evaluation: Type 1 Diabetes Mellitus     Date of Initial Endocrinology Visit: 08/12/2022    PATIENT IDENTIFIER: Jonathan Porter is a 33 y.o. male with a past medical history of T1DM. The patient presented for initial endocrinology clinic visit on 08/12/2022 for consultative assistance with his diabetes management.    HPI: Jonathan Porter was    Diagnosed with DM age 78 Hemoglobin A1c has ranged from 7.9% in 2020, peaking at 12.7% in 2022.   Has chronic GI issues  Does physical work and dexcom did not work for him as it kept falling    On his initial visit to our clinic his A1c was 9.0%  SUBJECTIVE:   During the last visit (11/10/2023): A1c 7.1%     Today (05/17/24): Jonathan Porter is here for follow-up on diabetes management.He  checks his blood sugars 3-4 times daily. The patient has had rare hypoglycemic episodes since the last clinic visit, he is symptomatic. No specific pattern    Has morning nausea but no vomiting  Has GERD and indigestion  Has occasional loose stools  Has noted cervical lymphadenopathy last week, which was tender but has resolved     HOME DIABETES REGIMEN: Lantus  22 units daily  Novolog  10 units TIDQAC CF: NovoLog (BG -130/35)- not taking      Statin: yes ACE-I/ARB: no    METER DOWNLOAD SUMMARY: n/a    DIABETIC COMPLICATIONS: Microvascular complications:   Denies: CKD, retinopathy, neuropathy  Last eye exam: 09/2023  Macrovascular complications:   Denies: CAD, PVD, CVA   PAST HISTORY: Past Medical History:  Past Medical History:  Diagnosis Date   Anxiety    Depression    Diabetes mellitus without complication (HCC)    Gastroparesis    GERD (gastroesophageal reflux disease)    Past Surgical History:  Past Surgical History:  Procedure Laterality Date   NO PAST SURGERIES     UPPER  GASTROINTESTINAL ENDOSCOPY      Social History:  reports that he has been smoking cigarettes. He has a 3 pack-year smoking history. He has never used smokeless tobacco. He reports that he does not currently use alcohol. He reports that he does not currently use drugs after having used the following drugs: Marijuana. Family History:  Family History  Problem Relation Age of Onset   Thyroid disease Mother    Diabetes Maternal Aunt    Diabetes Maternal Grandmother    Diabetes Maternal Grandfather    Thyroid disease Other    Colon cancer Neg Hx    Esophageal cancer Neg Hx    Stomach cancer Neg Hx    Rectal cancer Neg Hx      HOME MEDICATIONS: Allergies as of 05/17/2024       Reactions   Shellfish Allergy Anaphylaxis        Medication List        Accurate as of May 17, 2024  7:43 AM. If you have any questions, ask your nurse or doctor.          Accu-Chek Guide Test test strip Generic drug: glucose blood Use to check blood sugars 4 to 5 times per day.   dicyclomine  20 MG tablet Commonly known as: BENTYL  Take 1 tablet (20 mg total) by mouth 2 (two) times daily.   hydrOXYzine  10 MG tablet Commonly known as: ATARAX   Take 1 tablet (10 mg total) by mouth 3 (three) times daily as needed.   ibuprofen  800 MG tablet Commonly known as: ADVIL  Take 1 tablet (800 mg total) by mouth every 8 (eight) hours as needed for moderate pain (pain score 4-6).   Lantus  SoloStar 100 UNIT/ML Solostar Pen Generic drug: insulin  glargine Inject 22 Units into the skin daily.   lisinopril  10 MG tablet Commonly known as: ZESTRIL  Take 1 tablet (10 mg total) by mouth daily.   NovoLOG  FlexPen 100 UNIT/ML FlexPen Generic drug: insulin  aspart Take 10 units 3 times daily with food. Max daily 45 units   omeprazole  40 MG capsule Commonly known as: PRILOSEC Take 1 capsule (40 mg total) by mouth daily with breakfast.   pregabalin  25 MG capsule Commonly known as: Lyrica  Take 1 capsule (25 mg  total) by mouth 2 (two) times daily.   sildenafil  50 MG tablet Commonly known as: VIAGRA  TAKE 1 TABLET BY MOUTH ON AN EMPTY STOMACH 1  HOUR  BEFORE  SEXUAL  ACTIVITY  AS  NEEDED   TechLite Pen Needles 32G X 4 MM Misc Generic drug: Insulin  Pen Needle use as directed with insulin  morning, at noon, in the evening, and at bedtime.   tiZANidine  4 MG tablet Commonly known as: Zanaflex  Take 1 tablet (4 mg total) by mouth 2 (two) times daily as needed.   triamcinolone  cream 0.1 % Commonly known as: KENALOG  Apply 1 Application topically 2 (two) times daily.         ALLERGIES: Allergies  Allergen Reactions   Shellfish Allergy Anaphylaxis     REVIEW OF SYSTEMS: A comprehensive ROS was conducted with the patient and is negative except as per HPI   OBJECTIVE:   VITAL SIGNS: BP 120/88 (BP Location: Left Arm, Patient Position: Sitting, Cuff Size: Normal)   Pulse 66   Ht 5' 11 (1.803 m)   Wt 246 lb (111.6 kg)   SpO2 99%   BMI 34.31 kg/m    PHYSICAL EXAM:  General: Pt appears well and is in NAD  Neck: General: Supple without adenopathy or carotid bruits. Thyroid: Thyroid size normal.  No goiter or nodules appreciated.   Lungs: Clear with good BS bilat with no rales, rhonchi, or wheezes  Heart: RRR   Abdomen:  soft, nontender  Extremities:  Lower extremities - No pretibial edema  Neuro: MS is good with appropriate affect, pt is alert and Ox3    DM foot exam: 11/10/2023   The skin of the feet is without sores or ulcerations. The pedal pulses are 2+ on right and 2+ on left. The sensation is decreased to a screening 5.07, 10 gram monofilament bilaterally at the heels     DATA REVIEWED:  Lab Results  Component Value Date   HGBA1C 7.1 (A) 09/30/2023   HGBA1C 7.9 (A) 02/11/2023   HGBA1C 9.5 (A) 12/03/2022    Latest Reference Range & Units 01/08/24 09:30  Sodium 135 - 145 mmol/L 141  Potassium 3.5 - 5.1 mmol/L 4.3  Chloride 98 - 111 mmol/L 110  CO2 22 - 32 mmol/L 25   Glucose 70 - 99 mg/dL 868 (H)  BUN 6 - 20 mg/dL 12  Creatinine 9.38 - 8.75 mg/dL 8.70 (H)  Calcium  8.9 - 10.3 mg/dL 8.4 (L)  Anion gap 5 - 15  6  GFR, Estimated >60 mL/min >60   In office BG 91 mg/dL   ASSESSMENT / PLAN / RECOMMENDATIONS:   1) Type 1 Diabetes Mellitus, Optimally controlled, With  Neuropathic complications - Most recent A1c of 7.1 %. Goal A1c < 7.0 %.     -A1c 6.3% - He tried Dexcom and did not like it , as he keeps knocking it off at work , but he has a different line of work and is willing to try CGM.  Freestyle libre 3+ was sent to the pharmacy and he was shown by my assistant on proper way of application -No glucose data today, patient does endorse rare hypoglycemia, but unknown timing whether it is fasting versus during the day.  I am hoping with using the freestyle libre and we will gain more information -He has not been using correction scale, this will be provided again today - NO change  - MA/CR ratio pending  MEDICATIONS: Continue  Lantus  22 units daily  Continue Novolog  10 units TIDQAC Continue  CF: Novolog  (BG-130/35)   EDUCATION / INSTRUCTIONS: BG monitoring instructions: Patient is instructed to check his blood sugars 3 times a day, before meals . Call [facility] if: BG persistently < 70  I reviewed the Rule of 15 for the treatment of hypoglycemia in detail with the patient. Literature supplied.   2) Diabetic complications:  Eye: Does not have known diabetic retinopathy.  Neuro/ Feet: Does  have known diabetic peripheral neuropathy. Renal: Patient does not have known baseline CKD   3)GERD:   - This is uncontrolled - He had a prescription for omeprazole  in 2023, I refilled will be sent - Discussed the importance of avoiding food 3-4 hours prior to bedtime, discussed importance of limiting caffeine, and citric food    F/U in 4 months    Signed electronically by: Stefano Redgie Butts, MD  [facility] End[facility]   [facility] [address] [place] [place], [place] [address] Phone: [phone] FAX: [phone]   CC: Vicci Barnie NOVAK, MD [address] [place] [address] [place] [address] Phone: [phone]  Fax: [phone]    Return to Endocrinology clinic as below: No future appointments.

## 2024-05-17 NOTE — Patient Instructions (Addendum)
 Skin -Tac liquid and apply to the area prior to applying freestyle libre sensor  Continue Lantus  22 units daily  Continue Novolog  10 units with EACH meal  Novolog  correctional insulin : ADD extra units on insulin  to your meal-time Novolog  dose if your blood sugars are higher than 165. Use the scale below to help guide you:   Blood sugar before meal Number of units to inject  Less than 165 0 unit  166 -  200 1 units  201 -  235 2 units  236 -  270 3 units  271 -  305 4 units  306 -  340 5 units  341 -  375 6 units  376 -  410 7 units  411 -  445 8 units   HOW TO TREAT LOW BLOOD SUGARS (Blood sugar LESS THAN 70 MG/DL) Please follow the RULE OF 15 for the treatment of hypoglycemia treatment (when your (blood sugars are less than 70 mg/dL)   STEP 1: Take 15 grams of carbohydrates when your blood sugar is low, which includes:  3-4 GLUCOSE TABS  OR 3-4 OZ OF JUICE OR REGULAR SODA OR ONE TUBE OF GLUCOSE GEL    STEP 2: RECHECK blood sugar in 15 MINUTES STEP 3: If your blood sugar is still low at the 15 minute recheck --> then, go back to STEP 1 and treat AGAIN with another 15 grams of carbohydrates.

## 2024-05-18 ENCOUNTER — Ambulatory Visit: Payer: Self-pay | Admitting: Internal Medicine

## 2024-05-18 LAB — MICROALBUMIN / CREATININE URINE RATIO
Creatinine, Urine: 151 mg/dL (ref 20–320)
Microalb Creat Ratio: 1 mg/g{creat} (ref <30)
Microalb, Ur: 0.2 mg/dL

## 2024-05-28 ENCOUNTER — Other Ambulatory Visit: Payer: Self-pay | Admitting: Internal Medicine

## 2024-05-28 DIAGNOSIS — N529 Male erectile dysfunction, unspecified: Secondary | ICD-10-CM

## 2024-05-30 ENCOUNTER — Other Ambulatory Visit: Payer: Self-pay | Admitting: Internal Medicine

## 2024-05-30 ENCOUNTER — Other Ambulatory Visit: Payer: Self-pay

## 2024-05-30 ENCOUNTER — Encounter: Payer: Self-pay | Admitting: Internal Medicine

## 2024-05-30 DIAGNOSIS — N529 Male erectile dysfunction, unspecified: Secondary | ICD-10-CM

## 2024-06-01 ENCOUNTER — Other Ambulatory Visit: Payer: Self-pay | Admitting: Internal Medicine

## 2024-06-01 ENCOUNTER — Encounter: Payer: Self-pay | Admitting: Physician Assistant

## 2024-06-01 ENCOUNTER — Other Ambulatory Visit (HOSPITAL_COMMUNITY): Payer: Self-pay

## 2024-06-01 ENCOUNTER — Other Ambulatory Visit: Payer: Self-pay

## 2024-06-01 DIAGNOSIS — N529 Male erectile dysfunction, unspecified: Secondary | ICD-10-CM

## 2024-06-01 MED ORDER — SILDENAFIL CITRATE 50 MG PO TABS: ORAL_TABLET | ORAL | 1 refills | Status: DC

## 2024-06-02 NOTE — Telephone Encounter (Signed)
 Pharmacy Patient Advocate Encounter  Received notification from [employer]  [employer]ICA[employer] that Prior Authorization for Freestyle libre 3 plus sensor has been APPROVED from 05/17/24 to 11/17/24

## 2024-06-20 ENCOUNTER — Other Ambulatory Visit: Payer: Self-pay

## 2024-07-11 ENCOUNTER — Other Ambulatory Visit: Payer: Self-pay

## 2024-07-11 ENCOUNTER — Other Ambulatory Visit (HOSPITAL_COMMUNITY): Payer: Self-pay

## 2024-07-11 ENCOUNTER — Ambulatory Visit: Payer: MEDICAID | Attending: Internal Medicine | Admitting: Internal Medicine

## 2024-07-11 VITALS — BP 155/96 | HR 83 | Temp 98.3°F | Ht 71.0 in | Wt 239.0 lb

## 2024-07-11 DIAGNOSIS — Z794 Long term (current) use of insulin: Secondary | ICD-10-CM

## 2024-07-11 DIAGNOSIS — E1069 Type 1 diabetes mellitus with other specified complication: Secondary | ICD-10-CM

## 2024-07-11 DIAGNOSIS — N529 Male erectile dysfunction, unspecified: Secondary | ICD-10-CM

## 2024-07-11 DIAGNOSIS — I10 Essential (primary) hypertension: Secondary | ICD-10-CM

## 2024-07-11 DIAGNOSIS — F17210 Nicotine dependence, cigarettes, uncomplicated: Secondary | ICD-10-CM

## 2024-07-11 DIAGNOSIS — E1059 Type 1 diabetes mellitus with other circulatory complications: Secondary | ICD-10-CM | POA: Diagnosis not present

## 2024-07-11 DIAGNOSIS — F17200 Nicotine dependence, unspecified, uncomplicated: Secondary | ICD-10-CM

## 2024-07-11 MED ORDER — LISINOPRIL 10 MG PO TABS
10.0000 mg | ORAL_TABLET | Freq: Every day | ORAL | 3 refills | Status: AC
  Filled 2024-07-11 (×3): qty 90, 90d supply, fill #0

## 2024-07-11 MED ORDER — SILDENAFIL CITRATE 50 MG PO TABS: ORAL_TABLET | ORAL | 6 refills | Status: DC

## 2024-07-11 MED ORDER — NICOTINE 14 MG/24HR TD PT24
14.0000 mg | MEDICATED_PATCH | Freq: Every day | TRANSDERMAL | 1 refills | Status: AC
  Filled 2024-07-11 (×3): qty 28, 28d supply, fill #0
  Filled 2024-08-07: qty 28, 28d supply, fill #1

## 2024-07-11 NOTE — Patient Instructions (Signed)
  VISIT SUMMARY: During your follow-up visit, we discussed your hypertension, diabetes management, smoking cessation, and erectile dysfunction. We reviewed your current medications and lifestyle habits, and made adjustments to your treatment plan to better manage your conditions.  YOUR PLAN: -TYPE 2 DIABETES MELLITUS, INSULIN  REQUIRING: Type 2 diabetes is a condition where your body does not use insulin  properly, leading to high blood sugar levels. Your A1c levels have slightly increased, and your blood sugar levels are not consistently within the target range, especially at night. Continue your current insulin  regimen, but increase your Novolog  dose for larger meals. Manually check your blood sugar at night when alarms go off to confirm hypoglycemia. A referral for a diabetic eye exam has been submitted.  -HYPERTENSION: Hypertension is high blood pressure, which can lead to serious health issues if not managed properly. Your blood pressure is currently elevated at 155/96 mmHg. You will need to restart taking lisinopril  to help control your blood pressure. Please recheck your blood pressure regularly.  -NICOTINE  DEPENDENCE, CIGARS: Nicotine  dependence is an addiction to tobacco products. You smoke two cigars daily and have expressed a desire to quit. We will prescribe 14 mg nicotine  patches to help you quit smoking. Apply the patch daily, alternating shoulders.  -MALE ERECTILE DYSFUNCTION: Erectile dysfunction is the inability to get or keep an erection firm enough for sex. You are currently using Viagra  without issues. We will refill your Viagra  prescription.  INSTRUCTIONS: Please recheck your blood pressure regularly and manually check your blood sugar at night when alarms go off to confirm hypoglycemia. A referral for a diabetic eye exam has been submitted. Apply the nicotine  patch daily, alternating shoulders.                      Contains text generated by Abridge.                                  Contains text generated by Abridge.

## 2024-07-11 NOTE — Progress Notes (Signed)
 "   Patient ID: DEUNTE BLEDSOE, male    DOB: Jul 21, 1991  MRN: [id number]  CC: Follow-up (Follow-up. Med refills. /No questions / concerns/No to all vax)   Subjective: Kaysan Peixoto is a 33 y.o. male who presents for chronic ds management. His concerns today include:  Patient with history of insulin -dependent diabetes, MDD, genital warts, tob dep, ED, CTS  Discussed the use of AI scribe software for clinical note transcription with the patient, who gave verbal consent to proceed.  History of Present Illness DUGAN VANHOESEN is a 33 year old male with hypertension and diabetes who presents for a follow-up visit.  HTN: He stopped taking lisinopril  for hypertension after running out of the medication and not refilling it. He does not regularly check his blood pressure but mentions that it is usually within normal range when checked elsewhere. He tries to limit salt intake by not seasoning his food.  DM: Regarding his diabetes, his last A1c in August was 6.3. He continues to take Lantus  22 units daily and Novolog  10 units with meals and is followed by Dr. Kellie. He uses a Libre CGM. His blood sugars sometimes spike and drop, particularly noting lows at night between 12 and 3 AM, with readings dropping to 50 or lower. Recently, his Novolog  does not seem to be effective, requiring him to take more to manage his glucose levels. His current glucose reading is 159. He sometimes takes Novolog  before or after meals, depending on when he remembers. Review of CGM shows in past 7 days TIR 66% of the time, high 22% of the time and close 8% of the time. In past 2 wks TIR 63%, high 25% and lows12%.  He is physically active, walking daily as part of his lifestyle and work. He continues to use Viagra  and requests a refill.   Tob dep: He smokes about two cigars a day and wants to quit, having previously tried nicotine  patches several years ago.  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    Patient Active Problem List   Diagnosis Date Noted   MDD (major depressive disorder), recurrent episode, moderate (HCC) 09/16/2021   Major depressive disorder, single episode, moderate (HCC) 01/01/2021   Abdominal pain, recurrent 01/01/2021   Marijuana abuse 12/17/2020   Intractable nausea and vomiting 07/03/2019   Gastroparesis 07/02/2019   Influenza vaccine refused 06/03/2019   Erectile dysfunction 06/03/2019   Obesity (BMI 30-39.9) 06/03/2019   Type 1 diabetes mellitus with diabetic polyneuropathy (HCC) 01/21/2019   Type 1 diabetes mellitus with hyperglycemia (HCC) 01/21/2019   Paronychia of finger of left hand 01/20/2017   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 100 strip 11   insulin  aspart (NOVOLOG  FLEXPEN) 100 UNIT/ML FlexPen Take 10 units 3 times daily with food. Max daily 45 units 45 mL 3   insulin  glargine (LANTUS  SOLOSTAR) 100 UNIT/ML Solostar Pen Inject 22 Units into the skin daily. 30 mL 3   Insulin  Pen Needle 32G X 4 MM MISC use as directed with insulin  morning, at noon, in the evening, and at bedtime. 400 each 3   Continuous Glucose Sensor (FREESTYLE LIBRE 3 PLUS  SENSOR) MISC Use to check blood sugar continuously. Change sensor every 15 days. (Patient not taking: Reported on 07/11/2024) 6 each 3   ibuprofen  (ADVIL ) 800 MG tablet Take 1 tablet (800 mg total) by mouth every 8 (eight) hours as needed for moderate pain (pain score 4-6). 21 tablet 0   omeprazole  (PRILOSEC) 20 MG capsule Take 1 capsule (20 mg total) by mouth daily. (Patient not taking: Reported on 07/11/2024) 90 capsule 1   Current Facility-Administered Medications on File Prior to Visit   Medication Dose Route Frequency Provider Last Rate Last Admin   0.9 %  sodium chloride  infusion  500 mL Intravenous Once Mansouraty, Aloha Raddle., MD        Allergies  Allergen Reactions   Shellfish Allergy Anaphylaxis    Social History   Socioeconomic History   Marital status: Single    Spouse name: Not on file   Number of children: Not on file   Years of education: Not on file   Highest education level: 11th grade  Occupational History   Not on file  Tobacco Use   Smoking status: Every Day    Current packs/day: 0.50    Average packs/day: 0.5 packs/day for 6.0 years (3.0 ttl pk-yrs)    Types: Cigarettes   Smokeless tobacco: Never  Vaping Use   Vaping status: Never Used  Substance and Sexual Activity   Alcohol use: Not Currently    Comment: social    Drug use: Not Currently    Types: Marijuana   Sexual activity: Yes    Birth control/protection: Condom    Comment: stated used condom 80% of the time   Other Topics Concern   Not on file  Social History Narrative   Not on file   Social Drivers of Health   Financial Resource Strain: High Risk (07/11/2024)   Overall Financial Resource Strain (CARDIA)    Difficulty of Paying Living Expenses: Hard  Food Insecurity: Food Insecurity Present (07/11/2024)   Hunger Vital Sign    Worried About Running Out of Food in the Last Year: Often true    Ran Out of Food in the Last Year: Often true  Transportation Needs: No Transportation Needs (07/11/2024)   PRAPARE - Administrator, Civil Service (Medical): No    Lack of Transportation (Non-Medical): No  Physical Activity: Insufficiently Active (07/11/2024)   Exercise Vital Sign    Days of Exercise per Week: 4 days    Minutes of Exercise per Session: 30 min  Stress: Stress Concern Present (07/11/2024)   [employer] of Occupational Health - Occupational Stress Questionnaire    Feeling of Stress: Very much  Social Connections: Unknown (07/11/2024)   Social  Connection and Isolation Panel    Frequency of Communication with Friends and Family: Twice a week    Frequency of Social Gatherings with Friends and Family: Once a week    Attends Religious Services: Never    Database Administrator or Organizations: No    Attends Engineer, Structural: Not on file    Marital Status: Patient declined  Catering Manager Violence: Not on file    Family History  Problem Relation Age of Onset   Thyroid disease Mother    Diabetes Maternal Aunt    Diabetes Maternal Grandmother    Diabetes Maternal Grandfather    Thyroid disease Other    Colon cancer Neg Hx    Esophageal cancer Neg Hx    Stomach cancer Neg Hx  Rectal cancer Neg Hx     Past Surgical History:  Procedure Laterality Date   NO PAST SURGERIES     UPPER GASTROINTESTINAL ENDOSCOPY      ROS: Review of Systems Negative except as stated above  PHYSICAL EXAM: BP (!) 155/96 (BP Location: Left Arm, Patient Position: Sitting, Cuff Size: Normal)   Pulse 83   Temp 98.3 F (36.8 C) (Oral)   Ht 5' 11 (1.803 m)   Wt 239 lb (108.4 kg)   SpO2 100%   BMI 33.33 kg/m   Wt Readings from Last 3 Encounters:  07/11/24 239 lb (108.4 kg)  05/17/24 246 lb (111.6 kg)  01/08/24 244 lb 0.8 oz (110.7 kg)     Physical Exam   General appearance - alert, well appearing, young AAM and in no distress Mental status - normal mood, behavior, speech, dress, motor activity, and thought processes Chest - clear to auscultation, no wheezes, rales or rhonchi, symmetric air entry Heart - normal rate, regular rhythm, normal S1, S2, no murmurs, rubs, clicks or gallops Extremities - peripheral pulses normal, no pedal edema, no clubbing or cyanosis     Latest Ref Rng & Units 01/08/2024    9:30 AM 09/30/2023   10:37 AM 07/18/2022    1:40 PM  CMP  Glucose 70 - 99 mg/dL 868  75  635   BUN 6 - 20 mg/dL 12  13  12    Creatinine 0.61 - 1.24 mg/dL 8.70  8.88  8.73   Sodium 135 - 145 mmol/L 141  146  139    Potassium 3.5 - 5.1 mmol/L 4.3  4.2  4.7   Chloride 98 - 111 mmol/L 110  107  100   CO2 22 - 32 mmol/L 25  26  24    Calcium  8.9 - 10.3 mg/dL 8.4  9.4  9.3   Total Protein 6.0 - 8.5 g/dL  6.7  6.2   Total Bilirubin 0.0 - 1.2 mg/dL  0.2  0.3   Alkaline Phos 44 - 121 IU/L  116  114   AST 0 - 40 IU/L  22  18   ALT 0 - 44 IU/L  24  20    Lipid Panel     Component Value Date/Time   CHOL 173 09/30/2023 1037   TRIG 63 09/30/2023 1037   HDL 60 09/30/2023 1037   CHOLHDL 2.9 09/30/2023 1037   CHOLHDL 8.7 08/31/2007 0440   VLDL 52 (H) 08/31/2007 0440   LDLCALC 101 (H) 09/30/2023 1037    CBC    Component Value Date/Time   WBC 7.4 01/08/2024 0930   RBC 5.29 01/08/2024 0930   HGB 14.5 01/08/2024 0930   HGB 15.6 09/30/2023 1037   HCT 43.9 01/08/2024 0930   HCT 46.8 09/30/2023 1037   PLT 289 01/08/2024 0930   PLT 284 09/30/2023 1037   MCV 83.0 01/08/2024 0930   MCV 84 09/30/2023 1037   MCH 27.4 01/08/2024 0930   MCHC 33.0 01/08/2024 0930   RDW 14.4 01/08/2024 0930   RDW 13.6 09/30/2023 1037   LYMPHSABS 2.7 01/08/2024 0930   LYMPHSABS 3.2 (H) 09/30/2023 1037   MONOABS 0.6 01/08/2024 0930   EOSABS 1.0 (H) 01/08/2024 0930   EOSABS 1.1 (H) 09/30/2023 1037   BASOSABS 0.1 01/08/2024 0930   BASOSABS 0.1 09/30/2023 1037    ASSESSMENT AND PLAN: 1. Type 1 diabetes mellitus with other specified complication (HCC) (Primary) Continue Lantus  insulin  22 units daily and NovoLog  10 units with meals.  Advised that he can give himself an additiona 2-3 units  if his meal is larger than usual.  Advised of the importance of taking the NovoLog  5-10 mins before meal. -TIR is currently in the 60s.  Our goal is to get that above 70%. -In regards to his hypoglycemia that occurs between 2 and 3 AM in the mornings without symptoms, advised that for the next several nights he does manual check to make sure that the CGM is accurate. Keep appt with his endocrinologist Dr. Kellie - Ambulatory referral to  Ophthalmology  2. Hypertension associated with type 1 diabetes mellitus (HCC) Not at goal which is 130/80 or lower.  Recommend that he restarts the lisinopril .  - lisinopril  (ZESTRIL ) 10 MG tablet; Take 1 tablet (10 mg total) by mouth daily.  Dispense: 90 tablet; Refill: 3  3. Erectile dysfunction, unspecified erectile dysfunction type - sildenafil  (VIAGRA ) 50 MG tablet; TAKE 1 TABLET BY MOUTH ON AN EMPTY STOMACH 1  HOUR  BEFORE  SEXUAL  ACTIVITY  AS  NEEDED  Dispense: 10 tablet; Refill: 6  4. Tobacco dependence Advised to quit.  Patient think he is ready to give a trial of quitting.  Reports good results with nicotine  patches in the past.  He is agreeable to trying the nicotine  patch 14 mg.  I went over with him how to use.    Patient was given the opportunity to ask questions.  Patient verbalized understanding of the plan and was able to repeat key elements of the plan.   This documentation was completed using Paediatric nurse.  Any transcriptional errors are unintentional.  Orders Placed This Encounter  Procedures   Ambulatory referral to Ophthalmology     Requested Prescriptions   Signed Prescriptions Disp Refills   sildenafil  (VIAGRA ) 50 MG tablet 10 tablet 6    Sig: TAKE 1 TABLET BY MOUTH ON AN EMPTY STOMACH 1  HOUR  BEFORE  SEXUAL  ACTIVITY  AS  NEEDED   nicotine  (NICODERM CQ  - DOSED IN MG/24 HOURS) 14 mg/24hr patch 28 patch 1    Sig: Place 1 patch (14 mg total) onto the skin daily.   lisinopril  (ZESTRIL ) 10 MG tablet 90 tablet 3    Sig: Take 1 tablet (10 mg total) by mouth daily.    Return in about 6 months (around 01/09/2025).  Barnie Louder, MD, FACP "

## 2024-07-12 ENCOUNTER — Encounter: Payer: Self-pay | Admitting: Internal Medicine

## 2024-07-13 ENCOUNTER — Other Ambulatory Visit (HOSPITAL_COMMUNITY): Payer: Self-pay

## 2024-07-25 ENCOUNTER — Encounter: Payer: Self-pay | Admitting: Internal Medicine

## 2024-07-25 ENCOUNTER — Encounter: Payer: Self-pay | Admitting: Radiology

## 2024-08-03 ENCOUNTER — Telehealth: Payer: Self-pay | Admitting: Internal Medicine

## 2024-08-03 NOTE — Telephone Encounter (Signed)
Letter written for pt

## 2024-08-04 ENCOUNTER — Other Ambulatory Visit: Payer: Self-pay

## 2024-08-08 ENCOUNTER — Other Ambulatory Visit (HOSPITAL_COMMUNITY): Payer: Self-pay

## 2024-08-11 ENCOUNTER — Other Ambulatory Visit (HOSPITAL_COMMUNITY): Payer: Self-pay

## 2024-08-18 ENCOUNTER — Encounter: Payer: Self-pay | Admitting: Internal Medicine

## 2024-08-22 ENCOUNTER — Other Ambulatory Visit: Payer: Self-pay | Admitting: Internal Medicine

## 2024-08-22 DIAGNOSIS — N529 Male erectile dysfunction, unspecified: Secondary | ICD-10-CM

## 2024-08-22 MED ORDER — SILDENAFIL CITRATE 100 MG PO TABS: ORAL_TABLET | ORAL | 4 refills | Status: AC

## 2024-08-29 ENCOUNTER — Other Ambulatory Visit (HOSPITAL_BASED_OUTPATIENT_CLINIC_OR_DEPARTMENT_OTHER): Payer: Self-pay

## 2024-08-29 ENCOUNTER — Other Ambulatory Visit: Payer: Self-pay

## 2024-08-29 ENCOUNTER — Other Ambulatory Visit (HOSPITAL_COMMUNITY): Payer: Self-pay

## 2024-09-19 ENCOUNTER — Other Ambulatory Visit (HOSPITAL_COMMUNITY): Payer: Self-pay

## 2024-09-19 ENCOUNTER — Telehealth: Payer: MEDICAID | Admitting: Internal Medicine

## 2024-09-19 ENCOUNTER — Other Ambulatory Visit: Payer: Self-pay

## 2024-09-19 DIAGNOSIS — E1065 Type 1 diabetes mellitus with hyperglycemia: Secondary | ICD-10-CM | POA: Diagnosis not present

## 2024-09-19 DIAGNOSIS — E1042 Type 1 diabetes mellitus with diabetic polyneuropathy: Secondary | ICD-10-CM | POA: Diagnosis not present

## 2024-09-19 MED ORDER — NOVOLOG FLEXPEN 100 UNIT/ML ~~LOC~~ SOPN
45.0000 [IU] | PEN_INJECTOR | Freq: Every day | SUBCUTANEOUS | 3 refills | Status: AC
  Filled 2024-09-19: qty 45, 100d supply, fill #0

## 2024-09-19 MED ORDER — ACCU-CHEK GUIDE W/DEVICE KIT
PACK | 0 refills | Status: AC
  Filled 2024-09-19: qty 1, fill #0
  Filled 2024-09-19: qty 1, 30d supply, fill #0

## 2024-09-19 MED ORDER — LANTUS SOLOSTAR 100 UNIT/ML ~~LOC~~ SOPN
22.0000 [IU] | PEN_INJECTOR | Freq: Every day | SUBCUTANEOUS | 3 refills | Status: AC
  Filled 2024-09-19: qty 30, 136d supply, fill #0

## 2024-09-19 MED ORDER — ACCU-CHEK SOFTCLIX LANCETS MISC
3 refills | Status: AC
  Filled 2024-09-19: qty 360, fill #0

## 2024-09-19 MED ORDER — ACCU-CHEK GUIDE TEST VI STRP
1.0000 | ORAL_STRIP | Freq: Three times a day (TID) | 3 refills | Status: AC
  Filled 2024-09-19: qty 300, 100d supply, fill #0

## 2024-09-19 MED ORDER — INSULIN PEN NEEDLE 32G X 4 MM MISC
1.0000 | Freq: Four times a day (QID) | 3 refills | Status: AC
  Filled 2024-09-19: qty 400, 100d supply, fill #0

## 2024-09-19 NOTE — Progress Notes (Signed)
 Virtual Visit via Video Note  I connected with Jonathan Porter on 09/19/2024 at  7:50 AM EST by a video enabled telemedicine application and verified that I am speaking with the correct person using two identifiers.   I discussed the limitations of evaluation and management by telemedicine and the availability of in person appointments. The patient expressed understanding and agreed to proceed.   -Location of the patient : Work  -Location of the provider : Office -The names of all persons participating in the telemedicine service : Pt and myself      Name: Jonathan Porter  MRN/ DOB: [id number], [birth year]   Age/ Sex: 33 y.o., male    PCP: Vicci Barnie NOVAK, MD   Reason for Endocrinology Evaluation: Type 1 Diabetes Mellitus     Date of Initial Endocrinology Visit: 08/12/2022    PATIENT IDENTIFIER: Jonathan Porter is a 33 y.o. male with a past medical history of T1DM. The patient presented for initial endocrinology clinic visit on 08/12/2022 for consultative assistance with his diabetes management.    HPI: Jonathan Porter was    Diagnosed with DM age 2 Hemoglobin A1c has ranged from 7.9% in 2020, peaking at 12.7% in 2022.   Has chronic GI issues  Does physical work and dexcom did not work for him as it kept falling    On his initial visit to our clinic his A1c was 9.0%  SUBJECTIVE:   During the last visit (05/17/2024): A1c 7.1%     Today (09/19/2024): Jonathan Porter is here for follow-up on diabetes management.He typically checks his blood sugars 3-4 times daily.  But due to changes in health insurance he has not been able to check glucose in 3 weeks.  I prescribed Accu-Chek guide and strips were sent to the pharmacy   He does have occasional nausea with hunger, no vomiting Denies any constipation or diarrhea     HOME DIABETES REGIMEN: Lantus  22 units daily  Novolog  10 units TIDQAC    Statin: yes ACE-I/ARB: no    METER DOWNLOAD SUMMARY:  n/a    DIABETIC COMPLICATIONS: Microvascular complications:   Denies: CKD, retinopathy, neuropathy  Last eye exam: 09/2023  Macrovascular complications:   Denies: CAD, PVD, CVA   PAST HISTORY: Past Medical History:  Past Medical History:  Diagnosis Date   Anxiety    Depression    Diabetes mellitus without complication (HCC)    Gastroparesis    GERD (gastroesophageal reflux disease)    Past Surgical History:  Past Surgical History:  Procedure Laterality Date   NO PAST SURGERIES     UPPER GASTROINTESTINAL ENDOSCOPY      Social History:  reports that he has been smoking cigarettes. He has a 3 pack-year smoking history. He has never used smokeless tobacco. He reports that he does not currently use alcohol. He reports that he does not currently use drugs after having used the following drugs: Marijuana. Family History:  Family History  Problem Relation Age of Onset   Thyroid disease Mother    Diabetes Maternal Aunt    Diabetes Maternal Grandmother    Diabetes Maternal Grandfather    Thyroid disease Other    Colon cancer Neg Hx    Esophageal cancer Neg Hx    Stomach cancer Neg Hx    Rectal cancer Neg Hx      HOME MEDICATIONS: Allergies as of 09/19/2024       Reactions   Shellfish Allergy Anaphylaxis  Medication List        Accurate as of September 19, 2024  7:43 AM. If you have any questions, ask your nurse or doctor.          STOP taking these medications    Accu-Chek Guide Test test strip Generic drug: glucose blood   FreeStyle Libre 3 Plus Sensor Misc       TAKE these medications    ibuprofen  800 MG tablet Commonly known as: ADVIL  Take 1 tablet (800 mg total) by mouth every 8 (eight) hours as needed for moderate pain (pain score 4-6).   Insulin  Aspart FlexPen 100 UNIT/ML Commonly known as: NOVOLOG  Take 10 units 3 times daily with food. Max daily 45 units   Lantus  SoloStar 100 UNIT/ML Solostar Pen Generic drug: insulin   glargine Inject 22 Units into the skin daily.   lisinopril  10 MG tablet Commonly known as: ZESTRIL  Take 1 tablet (10 mg total) by mouth daily.   nicotine  14 mg/24hr patch Commonly known as: NICODERM CQ  - dosed in mg/24 hours Place 1 patch (14 mg total) onto the skin daily.   omeprazole  20 MG capsule Commonly known as: PRILOSEC Take 1 capsule (20 mg total) by mouth daily.   sildenafil  100 MG tablet Commonly known as: VIAGRA  TAKE 1 TABLET BY MOUTH ON AN EMPTY STOMACH 1  HOUR  BEFORE  SEXUAL  ACTIVITY  AS  NEEDED   TechLite Pen Needles 32G X 4 MM Misc Generic drug: Insulin  Pen Needle use as directed with insulin  morning, at noon, in the evening, and at bedtime.         ALLERGIES: Allergies  Allergen Reactions   Shellfish Allergy Anaphylaxis     REVIEW OF SYSTEMS: A comprehensive ROS was conducted with the patient and is negative except as per HPI   OBJECTIVE:   VITAL SIGNS: There were no vitals taken for this visit.   PHYSICAL EXAM:  General: Pt appears well and is in NAD  Neuro: MS is good with appropriate affect, pt is alert and Ox3    DM foot exam: 11/10/2023   The skin of the feet is without sores or ulcerations. The pedal pulses are 2+ on right and 2+ on left. The sensation is decreased to a screening 5.07, 10 gram monofilament bilaterally at the heels     DATA REVIEWED:  Lab Results  Component Value Date   HGBA1C 6.3 (A) 05/17/2024   HGBA1C 7.1 (A) 09/30/2023   HGBA1C 7.9 (A) 02/11/2023    Latest Reference Range & Units 01/08/24 09:30  Sodium 135 - 145 mmol/L 141  Potassium 3.5 - 5.1 mmol/L 4.3  Chloride 98 - 111 mmol/L 110  CO2 22 - 32 mmol/L 25  Glucose 70 - 99 mg/dL 868 (H)  BUN 6 - 20 mg/dL 12  Creatinine 9.38 - 8.75 mg/dL 8.70 (H)  Calcium  8.9 - 10.3 mg/dL 8.4 (L)  Anion gap 5 - 15  6  GFR, Estimated >60 mL/min >60   ASSESSMENT / PLAN / RECOMMENDATIONS:   1) Type 1 Diabetes Mellitus, Optimally controlled, With Neuropathic  complications - Most recent A1c of 6.3 %. Goal A1c < 7.0 %.     -A1c 6.3% - He tried Dexcom and did not like it , as he keeps knocking it off at work -He has not been using correction scale, this will be provided again today - I did advise the patient to take NovoLog  10 units with his typical meal, and take 5 units with a smaller  meal - I did encourage the patient the importance of checking glucose 3 times daily before each meal - No changes at this time - A prescription for Accu-Chek guide, lancets, strips, and insulin  was sent to [facility] pharmacy based on his request - The patient wanted somebody from my office to contact him regarding his insurance, I did explain to the patient that we do not handle insurance plans, and he will have to contact the 800-number listed on his insurance card and request a patient advocate or a case manager  MEDICATIONS: Continue  Lantus  22 units daily  Continue Novolog  10 units TIDQAC Continue  CF: Novolog  (BG-130/35)   EDUCATION / INSTRUCTIONS: BG monitoring instructions: Patient is instructed to check his blood sugars 3 times a day, before meals . Call [facility] if: BG persistently < 70  I reviewed the Rule of 15 for the treatment of hypoglycemia in detail with the patient. Literature supplied.   2) Diabetic complications:  Eye: Does not have known diabetic retinopathy.  Neuro/ Feet: Does  have known diabetic peripheral neuropathy. Renal: Patient does not have known baseline CKD     F/U in 4 months    Signed electronically by: Stefano Redgie Butts, MD  [facility]  [facility] [address] [place] [place], [place] [address] Phone: [phone] FAX: [phone]   CC: Vicci Barnie NOVAK, MD [address] [place] [address] [place] [address] Phone: [phone]  Fax: [phone]    Return to Endocrinology clinic as below: Future Appointments  Date Time Provider Department Center   09/19/2024  7:50 AM [place] Holian, Donell Redgie, MD [id number] None  01/09/2025  2:30 PM Vicci Barnie NOVAK, MD [id number]

## 2025-01-09 ENCOUNTER — Ambulatory Visit: Payer: MEDICAID | Admitting: Internal Medicine
